# Patient Record
Sex: Female | Born: 1990 | Race: White | Hispanic: No | Marital: Single | Smoking: Former smoker
Health system: Southern US, Community
[De-identification: ages and names within clinical notes are randomized; demographics above are authoritative.]

## PROBLEM LIST (undated history)

## (undated) DIAGNOSIS — F29 Unspecified psychosis not due to a substance or known physiological condition: Secondary | ICD-10-CM

## (undated) DIAGNOSIS — F419 Anxiety disorder, unspecified: Secondary | ICD-10-CM

## (undated) DIAGNOSIS — R454 Irritability and anger: Secondary | ICD-10-CM

## (undated) DIAGNOSIS — F319 Bipolar disorder, unspecified: Secondary | ICD-10-CM

## (undated) DIAGNOSIS — F329 Major depressive disorder, single episode, unspecified: Secondary | ICD-10-CM

## (undated) DIAGNOSIS — F22 Delusional disorders: Secondary | ICD-10-CM

## (undated) HISTORY — DX: Unspecified psychosis not due to a substance or known physiological condition: F29

## (undated) HISTORY — DX: Delusional disorders: F22

## (undated) HISTORY — DX: Major depressive disorder, single episode, unspecified: F32.9

## (undated) HISTORY — DX: Irritability and anger: R45.4

## (undated) HISTORY — DX: Anxiety disorder, unspecified: F41.9

## (undated) HISTORY — PX: OTHER SURGICAL HISTORY: SHX169

---

## 2013-11-18 ENCOUNTER — Encounter (HOSPITAL_COMMUNITY): Payer: Self-pay | Admitting: Emergency Medicine

## 2013-11-18 ENCOUNTER — Emergency Department (HOSPITAL_COMMUNITY): Payer: 59

## 2013-11-18 ENCOUNTER — Emergency Department (HOSPITAL_COMMUNITY)
Admission: EM | Admit: 2013-11-18 | Discharge: 2013-11-19 | Disposition: A | Discharge status: Other Acute Inpatient Hospital | Payer: 59 | Attending: Emergency Medicine | Admitting: Emergency Medicine

## 2013-11-18 DIAGNOSIS — Z0289 Encounter for other administrative examinations: Secondary | ICD-10-CM | POA: Insufficient documentation

## 2013-11-18 DIAGNOSIS — R42 Dizziness and giddiness: Secondary | ICD-10-CM | POA: Insufficient documentation

## 2013-11-18 DIAGNOSIS — F1910 Other psychoactive substance abuse, uncomplicated: Secondary | ICD-10-CM

## 2013-11-18 DIAGNOSIS — F29 Unspecified psychosis not due to a substance or known physiological condition: Secondary | ICD-10-CM | POA: Diagnosis present

## 2013-11-18 DIAGNOSIS — F411 Generalized anxiety disorder: Secondary | ICD-10-CM | POA: Insufficient documentation

## 2013-11-18 DIAGNOSIS — Z79899 Other long term (current) drug therapy: Secondary | ICD-10-CM | POA: Insufficient documentation

## 2013-11-18 DIAGNOSIS — R55 Syncope and collapse: Secondary | ICD-10-CM | POA: Insufficient documentation

## 2013-11-18 DIAGNOSIS — R5381 Other malaise: Secondary | ICD-10-CM | POA: Insufficient documentation

## 2013-11-18 LAB — URINALYSIS, ROUTINE W REFLEX MICROSCOPIC
Bilirubin Urine: NEGATIVE
Glucose, UA: NEGATIVE mg/dL
Hgb urine dipstick: NEGATIVE
Ketones, ur: NEGATIVE mg/dL
Leukocytes, UA: NEGATIVE
Nitrite: NEGATIVE
Protein, ur: NEGATIVE mg/dL
Specific Gravity, Urine: 1.003 — ABNORMAL LOW (ref 1.005–1.030)
Urobilinogen, UA: 0.2 mg/dL (ref 0.0–1.0)
pH: 7.5 (ref 5.0–8.0)

## 2013-11-18 LAB — HCG, SERUM, QUALITATIVE: Preg, Serum: NEGATIVE

## 2013-11-18 LAB — COMPREHENSIVE METABOLIC PANEL
ALT: 11 U/L (ref 0–35)
AST: 15 U/L (ref 0–37)
Albumin: 4 g/dL (ref 3.5–5.2)
Alkaline Phosphatase: 60 U/L (ref 39–117)
BUN: 8 mg/dL (ref 6–23)
CO2: 26 mEq/L (ref 19–32)
Calcium: 9.6 mg/dL (ref 8.4–10.5)
Chloride: 101 mEq/L (ref 96–112)
Creatinine, Ser: 0.83 mg/dL (ref 0.50–1.10)
GFR calc Af Amer: >90 mL/min (ref >90)
GFR calc non Af Amer: >90 mL/min (ref >90)
Glucose, Bld: 104 mg/dL — ABNORMAL HIGH (ref 70–99)
Potassium: 4.1 mEq/L (ref 3.5–5.1)
Sodium: 137 mEq/L (ref 135–145)
Total Bilirubin: 0.3 mg/dL (ref 0.3–1.2)
Total Protein: 7 g/dL (ref 6.0–8.3)

## 2013-11-18 LAB — CBC WITH DIFFERENTIAL/PLATELET
Basophils Absolute: 0.1 10*3/uL (ref 0.0–0.1)
Basophils Relative: 1 % (ref 0–1)
Eosinophils Absolute: 0.1 10*3/uL (ref 0.0–0.7)
Eosinophils Relative: 1 % (ref 0–5)
HCT: 43.2 % (ref 36.0–46.0)
Hemoglobin: 14.8 g/dL (ref 12.0–15.0)
Lymphocytes Relative: 20 % (ref 12–46)
Lymphs Abs: 2.1 10*3/uL (ref 0.7–4.0)
MCH: 30.3 pg (ref 26.0–34.0)
MCHC: 34.3 g/dL (ref 30.0–36.0)
MCV: 88.5 fL (ref 78.0–100.0)
Monocytes Absolute: 0.8 10*3/uL (ref 0.1–1.0)
Monocytes Relative: 8 % (ref 3–12)
Neutro Abs: 7.5 10*3/uL (ref 1.7–7.7)
Neutrophils Relative %: 71 % (ref 43–77)
Platelets: 257 10*3/uL (ref 150–400)
RBC: 4.88 MIL/uL (ref 3.87–5.11)
RDW: 12.8 % (ref 11.5–15.5)
WBC: 10.7 10*3/uL — ABNORMAL HIGH (ref 4.0–10.5)

## 2013-11-18 LAB — RAPID URINE DRUG SCREEN, HOSP PERFORMED
Amphetamines: NOT DETECTED
Barbiturates: NOT DETECTED
Benzodiazepines: NOT DETECTED
Cocaine: NOT DETECTED
Opiates: NOT DETECTED
Tetrahydrocannabinol: NOT DETECTED

## 2013-11-18 LAB — TSH: TSH: 0.557 u[IU]/mL (ref 0.350–4.500)

## 2013-11-18 LAB — CARBAMAZEPINE LEVEL, TOTAL: Carbamazepine Lvl: 1.9 ug/mL — ABNORMAL LOW (ref 4.0–12.0)

## 2013-11-18 MED ORDER — LORAZEPAM 1 MG PO TABS
1.0000 mg | ORAL_TABLET | Freq: Three times a day (TID) | ORAL | Status: DC | PRN
  Administered 2013-11-18: 1 mg via ORAL
  Filled 2013-11-18 (×2): qty 1

## 2013-11-18 MED ORDER — OLANZAPINE 5 MG PO TBDP
5.0000 mg | ORAL_TABLET | Freq: Every day | ORAL | Status: DC
  Administered 2013-11-18: 5 mg via ORAL
  Filled 2013-11-18 (×2): qty 1
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
## 2013-11-18 NOTE — ED Notes (Signed)
Patient attempted to void but was not successful.
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
## 2013-11-18 NOTE — ED Notes (Signed)
Pt is aware of the need for urine. Parents will encourage pt to collect one as soon as possible. Urine cup at bedside

## 2013-11-18 NOTE — ED Notes (Signed)
Bed: WA08 Expected date:  Expected time:  Means of arrival:  Comments: syncope 

## 2013-11-18 NOTE — ED Notes (Signed)
Spoke with [facility] rep. Who stated pt would have bed available tomorrow morning after 8 am. Nurse should call report at [phone]. She will be staying in [facility] building. Told to call if pt's voluntary status changes.

## 2013-11-18 NOTE — ED Notes (Signed)
She is acting very strangely; and tells [place] she wants to "go shopping".  Her parents remain with her and ask if we can compell her to stay.  She does not threaten harm to herself or others.

## 2013-11-18 NOTE — ED Notes (Signed)
Patient denies SI, HI, AVH. Contracts for safety. Denies depression, rates anxiety 9/10. Patient reports being told of a heart murmur while in [place]  Patient states that she has anxiety and post traumatic stress that started while she was in the [place] Patient also reports loss and change among her friendships.  Encouragment offered. Patient safety maintained on the unit.Q 15 checks initiated and continued.

## 2013-11-18 NOTE — ED Notes (Signed)
Pt ambulating to the restroom frequently with parents, however no urine sample collected at this time.

## 2013-11-18 NOTE — ED Notes (Signed)
Mother and father took all of pts belonging home

## 2013-11-18 NOTE — ED Notes (Signed)
She states she "passed out" this morning while in her bathroom.  Her mom phoned EMS, who found her to be awake and oriented x4.  She further tells [place] she recently vacationed in the [place] to "visit my boyfriend".  She has notes with her which indicate she was given psychiatric care while there, and was placed on several meds.  Currently she is alert and oriented x 4 and in no distress.

## 2013-11-18 NOTE — ED Notes (Signed)
Family at bedside. 

## 2013-11-18 NOTE — ED Notes (Addendum)
Pt made aware of the new order for in and out cath. Pt then stated that she was able to urinate, pt and mother walked to restroom with urine cup. Pt then came out of restroom stating that she urinated but not in the cup. Pt and family members made aware that the cath will now be completed, pt stating that she refuses to have this done. Water at bedside. RN and MD made aware

## 2013-11-18 NOTE — ED Notes (Signed)
Explained to pt and family that pt would be moved to Psych ED while she awaited placement. Pt given scrubs to change into.

## 2013-11-18 NOTE — Progress Notes (Addendum)
The NP, [facility]) reports that the patient meets criteria for inpatient hospitalization.  Per the [facility]) there are no beds at [facility].    [facility] reports that they do have beds.  Writer faxed referral to [facility]. Incoming TTS will follow up on the referral.    Writer informeinformed the family, nurse working with the patient and the ER MD of the patients disposition. Writer  that the patient has been referre

## 2013-11-18 NOTE — Progress Notes (Signed)
Referrals have been faxed to [facility], [facility], [facility] and [facility].  Incoming staff will follow up on the referrals for placement.

## 2013-11-18 NOTE — Consult Note (Signed)
  Subjective:  Patient mother and father at bed side.  Patient states that she was in the [place] for the last five months visiting her boyfriend.  While there she states "I smoked some weed and a leaf but I don't know what the leaf was."  Patient states that she had no history of psychosis or psychiatric illness prior to the drug use.  Patient states that she feels like she has just went through another exorcism and states that she hears voices.  "I hear the voices laughing and mocking me.  Patient denies suicidal ideation and homicidal ideation.  Patient states that she is paranoid that evil is after her and trying to harm her.  Patient's parents states that she has complained the she sees her arm glowing and has gotten up during the night trying to get out.  Patient appeared to be responding to verbal and tactile stimuli.   Axis I: Psychotic Disorder NOS Axis II: Deferred Axis III: History reviewed. No pertinent past medical history. Axis IV: other psychosocial or environmental problems and problems related to social environment Axis V: 21-30 behavior considerably influenced by delusions or hallucinations OR serious impairment in judgment, communication OR inability to function in almost all areas Psychiatric Specialty Exam: Physical Exam  ROS  Blood pressure 107/75, pulse 66, temperature 98.1 F (36.7 C), temperature source Oral, resp. rate 16, last menstrual period 11/04/2013, SpO2 99.00%.There is no height or weight on file to calculate BMI.  General Appearance: Bizarre  Eye Contact::  Fair  Speech:  Clear and Coherent and Slow  Volume:  Normal  Mood:  Anxious  Affect:  Constricted, Depressed and Flat  Thought Process:  Disorganized and flight of ideas  Orientation:  Full (Time, Place, and Person)  Thought Content:  Hallucinations: Auditory Tactile Visual  Suicidal Thoughts:  No  Homicidal Thoughts:  No  Memory:  Immediate;   Fair Recent;   Fair  Judgement:  Impaired  Insight:   Lacking  Psychomotor Activity:  Decreased  Concentration:  Fair  Recall:  Fair  Akathisia:  No  Handed:  Right  AIMS (if indicated):     Assets:  Communication Skills Desire for Improvement Housing Social Support Transportation Vocational/Educational  Sleep:      Face to face consult/interview and discussed with Dr. Lucianne Muss  Disposition:  Inpatient treatment recommended.  Discontinue Haldol 2 mg; Start Zyprexa Zydis 5 mg Q HS; CT Scan without contrast.  Monitor patient for safety and stabilization until inpatient bed is found.  Shuvon B. Rankin FNP-BC Family Nurse Practitioner, Board Certified

## 2013-11-19 DIAGNOSIS — F1910 Other psychoactive substance abuse, uncomplicated: Secondary | ICD-10-CM

## 2013-11-19 DIAGNOSIS — F29 Unspecified psychosis not due to a substance or known physiological condition: Secondary | ICD-10-CM

## 2013-11-19 NOTE — Consult Note (Signed)
Note reviewed and agreed with  

## 2013-11-19 NOTE — ED Notes (Signed)
Pt resting in bed with eyes closed. No signs of distress.

## 2013-11-19 NOTE — Consult Note (Signed)
   Subjective:  Patient states that she is feeling better this morning and that the medication was working better.  Patient states that she slept all night.   Patient appearance looked better than yesterday.  Patient states "No I'm not hearing anything right now.  I slept all night.  I do feel better."  Axis I: Psychotic Disorder NOS Axis II: Deferred Axis III: History reviewed. No pertinent past medical history. Axis IV: other psychosocial or environmental problems and problems related to social environment Axis V: 21-30 behavior considerably influenced by delusions or hallucinations OR serious impairment in judgment, communication OR inability to function in almost all areas  Psychiatric Specialty Exam: Physical Exam  ROS  Blood pressure 115/81, pulse 116, temperature 97.4 F (36.3 C), temperature source Oral, resp. rate 18, last menstrual period 11/04/2013, SpO2 97.00%.There is no height or weight on file to calculate BMI.  General Appearance: Bizarre  Eye Contact::  Fair  Speech:  Clear and Coherent and Slow  Volume:  Normal  Mood:  Anxious  Affect:  Depressed  Thought Process:  Disorganized and flight of ideas  Orientation:  Full (Time, Place, and Person)  Thought Content:  Hallucinations: Auditory Tactile Visual  Suicidal Thoughts:  No  Homicidal Thoughts:  No  Memory:  Immediate;   Fair Recent;   Fair  Judgement:  Impaired  Insight:  Fair  Psychomotor Activity:  Decreased  Concentration:  Fair  Recall:  Fair  Akathisia:  No  Handed:  Right  AIMS (if indicated):     Assets:  Communication Skills Desire for Improvement Housing Social Support Transportation Vocational/Educational  Sleep:      Face to face consult/interview with Dr. Ladona Ridgel  Disposition: Will continue with current treatment plan for inpatient treatment.  Patient accepted at [facility] and will be transferred some time today.  Continue to monitor for safety and stabilization until patient is  transferred.    Dalonda Simoni B. Juanell Saffo FNP-BC Family Nurse Practitioner, Board Certified
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
## 2013-11-19 NOTE — Progress Notes (Signed)
Pt accepted to [facility] by Dr. Adonis Housekeeper. Patient to be transported voluntarily by [employer] transportation. RN calling report to [phone]. CSW informed EDP. CSW also spoke with Arlana Pouch at [facility] and confirmed they are ready for patient.   Catha Gosselin, LCSW [phone]  ED CSW .11/19/2013 858am

## 2013-11-19 NOTE — ED Notes (Signed)
psychiatrist at bedside 

## 2013-11-19 NOTE — ED Notes (Signed)
Patient up every 1.5 to 2 hours during the night. Patient refused Ativan. Read until going back to sleep around 0400. Resting quietly at present. [name], patient's father called. Can be reached at [phone] home or [phone] cell. He will call back during phone hours.   Patient safety maintained, Q 15 checks continue.

## 2013-11-19 NOTE — ED Notes (Signed)
Meal given

## 2013-11-19 NOTE — ED Notes (Signed)
Report called to [facility]. Transportation available to transport by [facility]  at 1030.

## 2013-11-19 NOTE — Consult Note (Signed)
Case discussed, agree with plan 

## 2013-12-22 ENCOUNTER — Other Ambulatory Visit (HOSPITAL_COMMUNITY): Payer: 59 | Attending: Psychiatry | Admitting: Psychiatry

## 2013-12-22 ENCOUNTER — Encounter (HOSPITAL_COMMUNITY): Payer: Self-pay

## 2013-12-22 DIAGNOSIS — F4310 Post-traumatic stress disorder, unspecified: Secondary | ICD-10-CM

## 2013-12-22 DIAGNOSIS — F323 Major depressive disorder, single episode, severe with psychotic features: Secondary | ICD-10-CM

## 2013-12-22 DIAGNOSIS — F29 Unspecified psychosis not due to a substance or known physiological condition: Secondary | ICD-10-CM

## 2013-12-22 DIAGNOSIS — F22 Delusional disorders: Secondary | ICD-10-CM | POA: Insufficient documentation
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
## 2013-12-23 ENCOUNTER — Other Ambulatory Visit (HOSPITAL_COMMUNITY): Payer: 59 | Admitting: Psychiatry

## 2013-12-23 ENCOUNTER — Inpatient Hospital Stay (HOSPITAL_COMMUNITY)
Admission: RE | Admit: 2013-12-23 | Discharge: 2013-12-31 | DRG: 885 | Disposition: A | Discharge status: Home / Self Care | Payer: 59 | Attending: Psychiatry | Admitting: Psychiatry

## 2013-12-23 ENCOUNTER — Encounter (HOSPITAL_COMMUNITY): Payer: Self-pay | Admitting: Behavioral Health

## 2013-12-23 DIAGNOSIS — F410 Panic disorder [episodic paroxysmal anxiety] without agoraphobia: Secondary | ICD-10-CM | POA: Diagnosis present

## 2013-12-23 DIAGNOSIS — F200 Paranoid schizophrenia: Principal | ICD-10-CM | POA: Diagnosis present

## 2013-12-23 DIAGNOSIS — F411 Generalized anxiety disorder: Secondary | ICD-10-CM | POA: Diagnosis present

## 2013-12-23 DIAGNOSIS — Z23 Encounter for immunization: Secondary | ICD-10-CM

## 2013-12-23 DIAGNOSIS — Z87891 Personal history of nicotine dependence: Secondary | ICD-10-CM

## 2013-12-23 DIAGNOSIS — F3160 Bipolar disorder, current episode mixed, unspecified: Secondary | ICD-10-CM

## 2013-12-23 DIAGNOSIS — F29 Unspecified psychosis not due to a substance or known physiological condition: Secondary | ICD-10-CM

## 2013-12-23 DIAGNOSIS — F22 Delusional disorders: Secondary | ICD-10-CM

## 2013-12-23 DIAGNOSIS — F1210 Cannabis abuse, uncomplicated: Secondary | ICD-10-CM | POA: Diagnosis present

## 2013-12-23 DIAGNOSIS — F319 Bipolar disorder, unspecified: Secondary | ICD-10-CM | POA: Insufficient documentation

## 2013-12-23 MED ORDER — ACETAMINOPHEN 325 MG PO TABS: 650.0000 mg | ORAL_TABLET | Freq: Four times a day (QID) | ORAL | Status: DC | PRN

## 2013-12-23 MED ORDER — ALUM & MAG HYDROXIDE-SIMETH 200-200-20 MG/5ML PO SUSP: 30.0000 mL | ORAL | Status: DC | PRN

## 2013-12-23 MED ORDER — TRAZODONE HCL 50 MG PO TABS
50.0000 mg | ORAL_TABLET | Freq: Every evening | ORAL | Status: DC | PRN
  Administered 2013-12-23: 50 mg via ORAL
  Filled 2013-12-23: qty 1

## 2013-12-23 MED ORDER — OLANZAPINE 5 MG PO TBDP
5.0000 mg | ORAL_TABLET | Freq: Three times a day (TID) | ORAL | Status: DC | PRN
  Administered 2013-12-23 – 2013-12-28 (×4): 5 mg via ORAL
  Filled 2013-12-23 (×5): qty 1

## 2013-12-23 MED ORDER — INFLUENZA VAC SPLIT QUAD 0.5 ML IM SUSP
0.5000 mL | INTRAMUSCULAR | Status: AC
Start: 2013-12-24 — End: 2013-12-24
  Administered 2013-12-24: 0.5 mL via INTRAMUSCULAR
  Filled 2013-12-23: qty 0.5

## 2013-12-23 MED ORDER — MAGNESIUM HYDROXIDE 400 MG/5ML PO SUSP: 30.0000 mL | Freq: Every day | ORAL | Status: DC | PRN

## 2013-12-23 NOTE — Progress Notes (Signed)
    Daily Group Progress Note  Program: IOP  Group Time: 9:00-10:30 am   Participation Level: None  Behavioral Response: none  Type of Therapy:  Case Manager assessment  Summary of Progress: Today was Pts first day in the program. Pt was completing the initial assessment and orientation with the case manager.      Group Time: 10:30 am - 12:00 pm   Participation Level:  Active  Behavioral Response: none  Type of Therapy: medical assessment  Summary of Progress: Pt was completing the initial medical assessment with the psychiatrist.   Carman ChingENGLEHORN,Nadiya Pieratt E, LCSW
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
## 2013-12-23 NOTE — B[facility] Counselor (Signed)
Patient sent from [facility] IOP by Dr. Rutherford Limerickadepalli for an inpatient admission.Their was an error with this patients episode/[facility] assessment for today 12/23/2013 and you will not be able to see the information for today unless you refer to 02/04/2014. Please refer to the 02/04/2014 for clinical information regarding this patient's admission today.

## 2013-12-23 NOTE — Progress Notes (Signed)
Patient ID: [name], female   DOB: 08-12-1991, 23 y.o.   MRN: [id number] D: pt. Visible on unit in dayroom and in the hall. Pt. Denies SHI, or AVH. Pt. Suspicious and does not forward much information. A: Writer introduced self to client and encouraged her to verbalize any concerns. Writer encouraged group.  Staff will monitor q3715min for safety. R: Pt. Is safe on the unit and attended group.

## 2013-12-23 NOTE — Progress Notes (Signed)
Patient ID: [name], female   DOB: February 17, 1991, 23 y.o.   MRN: [id number] Patient seen today, her mother reports that she was up from 11 PM to 1 AM writing in her journal. Patient was late for her group, when seen patient states that in the group she felt she was being targeted and that entire group was talking about her. She then shared her journal with me which shows significant paranoia, ideas of reference and thought insertion. Patient then broke down crying stating that she could not handle this anymore. With patient's permission mom was brought in and it was discussed that patient needed to be on the inpatient unit to be stabilized. The goal is to gradually taper the Geodon and discontinued it and increase the Risperdal. Her lithium and her Trileptal and Cogentin will be continued. Patient willing to be hospitalized and will go into the hospital voluntarily.

## 2013-12-23 NOTE — Progress Notes (Signed)
Patient ID: [name], female   DOB: 10-Apr-1991, 23 y.o.   MRN: [id number] D:  Pt was admitted to the inpatient unit [facility] due to worsening paranoid-delusional thinking.  A:  Dr. Rutherford Limerickadepalli spoke to [facility]erv[facility] about admission and [place] in assessment dept.  Transfer pt to inpt.  It is recommended that pt return to [facility]-IOP after inpt per Dr. Rutherford Limerickadepalli.

## 2013-12-23 NOTE — Progress Notes (Signed)
Admission note: According to pt, she have been feeling like no one believes here. Pt believes that people are out to kill her,  targeting her and want to see her in prison. Pt started feeling paranoid while she was in the [place] with her boyfriend. Per pt, she's compliant with taking her meds. Pt denies VH but reports AH.  Pt hear voices at bedtime, "the voices say numbers". Pt denies SI/HI. Pt verbalized that she is having racing thoughts. Pt presents with flight of ideas, rapid pressured speech and tangential thoughts. Pt denies using alcohol or illegal drugs at this time.

## 2013-12-23 NOTE — Progress Notes (Signed)
Pt mother and father present tonight for visitation. Pt voiced concerns about feeling like the people (pts) here are out to get her. She feels like the television is targeting here. Pt is frightened and don't know who or what to believe. Pt father said that pt had K-2 in her system when she was in [place]. Per pt father, Geodon and lithium makes pt shaking. Haldol and trileptal have not been effective for pt.    Pt consent for Ronnie ([place]) father to receive information.

## 2013-12-23 NOTE — Progress Notes (Signed)
    Daily Group Progress Note  Program: IOP  Group Time: 9:00-10:30 am   Participation Level: None  Behavioral Response: Appropriate  Type of Therapy:  Process Group  Summary of Progress: Today was Pts first day in the group. Pt was quiet, did not engage and observed the other member with the group process. Members introduced themselves to Pt. During break Pt informed the case manager she felt paranoid in the group and did not feel comfortable returning.      Group Time: 10:30 am - 12:00 pm   Participation Level:  None  Behavioral Response: none  Type of Therapy: none  Summary of Progress: Pt was referred inpatient for hospitalization for psychosis.   Carman ChingENGLEHORN,Scarleth Brame E, LCSW

## 2013-12-23 NOTE — BH Assessment (Addendum)
See note

## 2013-12-23 NOTE — Tx Team (Signed)
Initial Interdisciplinary Treatment Plan  PATIENT STRENGTHS: (choose at least two) Ability for insight Capable of independent living Motivation for treatment/growth  PATIENT STRESSORS: Traumatic event   PROBLEM LIST: Problem List/Patient Goals Date to be addressed Date deferred Reason deferred Estimated date of resolution  Delusions  12/23/13     Paranoia  12/23/13     AH 12/23/13     Racing thoughts                                     DISCHARGE CRITERIA:  Ability to meet basic life and health needs Adequate post-discharge living arrangements Improved stabilization in mood, thinking, and/or behavior Verbal commitment to aftercare and medication compliance  PRELIMINARY DISCHARGE PLAN: Attend aftercare/continuing care group Attend PHP/IOP  PATIENT/FAMIILY INVOLVEMENT: This treatment plan has been presented to and reviewed with the patient, Lisa [name], and/or family member.  The patient and family have been given the opportunity to ask questions and make suggestions.  [name] L 12/23/2013, 2:21 PM

## 2013-12-24 ENCOUNTER — Other Ambulatory Visit (HOSPITAL_COMMUNITY): Payer: 59

## 2013-12-24 DIAGNOSIS — F3160 Bipolar disorder, current episode mixed, unspecified: Secondary | ICD-10-CM

## 2013-12-24 DIAGNOSIS — F22 Delusional disorders: Secondary | ICD-10-CM | POA: Diagnosis present

## 2013-12-24 LAB — RAPID URINE DRUG SCREEN, HOSP PERFORMED
Amphetamines: NOT DETECTED
Barbiturates: NOT DETECTED
Benzodiazepines: NOT DETECTED
Cocaine: NOT DETECTED
Opiates: NOT DETECTED
Tetrahydrocannabinol: NOT DETECTED

## 2013-12-24 LAB — COMPREHENSIVE METABOLIC PANEL
ALT: 14 U/L (ref 0–35)
AST: 15 U/L (ref 0–37)
Albumin: 4 g/dL (ref 3.5–5.2)
Alkaline Phosphatase: 74 U/L (ref 39–117)
BUN: 11 mg/dL (ref 6–23)
CO2: 27 mEq/L (ref 19–32)
Calcium: 9 mg/dL (ref 8.4–10.5)
Chloride: 102 mEq/L (ref 96–112)
Creatinine, Ser: 0.99 mg/dL (ref 0.50–1.10)
GFR calc Af Amer: >90 mL/min (ref >90)
GFR calc non Af Amer: 81 mL/min — ABNORMAL LOW (ref >90)
Glucose, Bld: 78 mg/dL (ref 70–99)
Potassium: 4.2 mEq/L (ref 3.7–5.3)
Sodium: 140 mEq/L (ref 137–147)
Total Bilirubin: <0.2 mg/dL — ABNORMAL LOW (ref 0.3–1.2)
Total Protein: 7 g/dL (ref 6.0–8.3)

## 2013-12-24 LAB — CBC
HCT: 39.8 % (ref 36.0–46.0)
Hemoglobin: 13.6 g/dL (ref 12.0–15.0)
MCH: 30.4 pg (ref 26.0–34.0)
MCHC: 34.2 g/dL (ref 30.0–36.0)
MCV: 88.8 fL (ref 78.0–100.0)
Platelets: 318 10*3/uL (ref 150–400)
RBC: 4.48 MIL/uL (ref 3.87–5.11)
RDW: 12.6 % (ref 11.5–15.5)
WBC: 9.8 10*3/uL (ref 4.0–10.5)

## 2013-12-24 LAB — PREGNANCY, URINE: Preg Test, Ur: NEGATIVE

## 2013-12-24 MED ORDER — FLUOXETINE HCL 10 MG PO CAPS
10.0000 mg | ORAL_CAPSULE | Freq: Every day | ORAL | Status: DC
  Administered 2013-12-24 – 2013-12-25 (×2): 10 mg via ORAL
  Filled 2013-12-24 (×3): qty 1

## 2013-12-24 MED ORDER — TRIHEXYPHENIDYL HCL 2 MG PO TABS
2.0000 mg | ORAL_TABLET | Freq: Every day | ORAL | Status: DC
  Administered 2013-12-24 – 2013-12-31 (×8): 2 mg via ORAL
  Filled 2013-12-24 (×4): qty 1
  Filled 2013-12-24: qty 14
  Filled 2013-12-24 (×5): qty 1

## 2013-12-24 MED ORDER — PALIPERIDONE ER 6 MG PO TB24
6.0000 mg | ORAL_TABLET | Freq: Every day | ORAL | Status: DC
  Administered 2013-12-24 – 2013-12-27 (×4): 6 mg via ORAL
  Filled 2013-12-24 (×5): qty 1

## 2013-12-24 MED ORDER — TRAZODONE HCL 50 MG PO TABS
50.0000 mg | ORAL_TABLET | Freq: Every day | ORAL | Status: DC
  Administered 2013-12-24 – 2013-12-28 (×5): 50 mg via ORAL
  Filled 2013-12-24 (×3): qty 1
  Filled 2013-12-24: qty 14
  Filled 2013-12-24 (×5): qty 1

## 2013-12-24 NOTE — Tx Team (Signed)
  Interdisciplinary Treatment Plan Update   Date Reviewed: 12/24/2013 Time Reviewed:  8:22 AM  Progress in Treatment:   Attending groups: Yes Participating in groups: No Taking medication as prescribed: Yes  Tolerating medication: Yes Family/Significant other contact made: No  Patient understands diagnosis: Yes, AEB asking help with paranoia.   Discussing patient identified problems/goals with staff: Yes, See initial care plan Medical problems stabilized or resolved: Yes Denies suicidal/homicidal ideation: Yes, in txt team  Patient has not harmed self or others: Yes For review of initial/current patient goals, please see plan of care.  Estimated Length of Stay:  4-5 days  Reasons for Continued Hospitalization:  Paranoia  Hallucinations  Medication stabilization Depression   New Problems/Goals identified:    Discharge Plan or Barriers:   Pt plans to return, follow up outp.    Additional Comments:  According to pt, she have been feeling like no one believes here. Pt believes that people are out to kill her, targeting her and want to see her in prison. Pt started feeling paranoid while she was in the [place] with her boyfriend. Per pt, she's compliant with taking her meds. Pt denies VH but reports AH. Pt hear voices at bedtime, "the voices say numbers". Pt denies SI/HI. Pt verbalized that she is having racing thoughts. Pt presents with flight of ideas, rapid pressured speech and tangential thoughts. Pt denies using alcohol or illegal drugs at this time.    Attendees:  Signature: Thedore MinsMojeed Akintayo, MD  12/23/2013 9:23 AM   Signature: Richelle Itood Joanann Mies, LCSW  12/23/2013 9:23 AM   Signature: Fransisca KaufmannLaura Davis, NP  12/23/2013 9:23 AM   Signature: Joslyn Devonaroline Beaudry, RN  12/23/2013 9:23 AM   Signature: Liborio NixonPatrice White, RN  12/23/2013 9:23 AM   Signature:  12/23/2013 9:23 AM   Signature:  12/23/2013 9:23 AM   Signature:    Signature:    Signature:    Signature:    Signature:    Signature:      Simona Huhina  Yang MSW Intern   12/24/2013 8:30 AM
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
## 2013-12-24 NOTE — Progress Notes (Signed)
Patient ID: Lisa FilterCorina Windholz, female   DOB: 1991/05/16, 23 y.o.   MRN: [id number] D: pt. Visible on the unit, walking the halls for exercise accompanied by another client. Pt. Is also visible in day room. Pt. Reports ready to go home, "nothing against y'all but I'm ready to go:" "I was paranoid earlier, but I'm doing fine now" Pt. reports that mom visited today and they had a nice visit. A: Writer encouraged pt. To continue meds and speak with physician in the morning about potential discharge plans. Writer encouraged group. Staff will monitor q915min for safety. R: Pt. Is safe on the unit and attended groups.

## 2013-12-24 NOTE — Progress Notes (Signed)
Morning Wellness  The focus of this group is to educate the patient on the purpose and policies of crisis stabilization and provide a format to answer questions about their admission. The group details unit policies and expectations of patients while admitted.   Spoke with patient individually. Patient has [facility] concerns [facility]. Patient remains paranoid and anxious. Patient was given emotional support from staff. Patient encouraged to come to staff with concerns and needs.
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
## 2013-12-24 NOTE — [facility] Group Notes (Signed)
[facility] Group Notes:  (Counselor/Nursing/MHT/Case Management/Adjunct)  12/24/2013 1:15PM  Type of Therapy:  Group Therapy  Participation Level:  Active  Participation Quality:  Appropriate  Affect:  Flat  Cognitive:  Oriented  Insight:  Improving  Engagement in Group:  Limited  Engagement in Therapy:  Limited  Modes of Intervention:  Discussion, Exploration and Socialization  Summary of Progress/Problems: The topic for group was balance in life.  Pt participated in the discussion about when their life was in balance and out of balance and how this feels.  Pt discussed ways to get back in balance and short term goals they can work on to get where they want to be. Lisa [name] believes she is on the way to balance.  "I am communicating with the staff and taking my medications.  Those are the most important things."  For the most part, she was able to meaningfully contribute to the conversation.  However, at one point she told us that before she came in, her father gave her some xanax to try to alleviate her symptoms, but it was not prescribed for her, and is afraid if it is found in her labs, that she will be jailed.  She became tearful while talking about this. She was assured that she will not be jailed, and that her paranoid thoughts are unfounded.  She had a difficult letting go of this as she brought it up a second time.   Daryel Geraldorth, Lisa [name] 12/24/2013 1:30 PM

## 2013-12-24 NOTE — Progress Notes (Signed)
Adult Psychoeducational Group Note  Date:  12/24/2013 Time:  9:15 PM  Group Topic/Focus:  Wrap-Up Group:   The focus of this group is to help patients review their daily goal of treatment and discuss progress on daily workbooks.  Participation Level:  Active  Participation Quality:  Appropriate  Affect:  Appropriate  Cognitive:  Alert  Insight: Appropriate  Engagement in Group:  Engaged  Modes of Intervention:  Discussion  Additional Comments:  Pt stated that her day has been good since her medications have been changed. Her mom came to visit her for dinner and is one of her strongest supporters.  Kaleen OdeaCOOKE, Chase Arnall R 12/24/2013, 9:15 PM
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
## 2013-12-24 NOTE — Progress Notes (Signed)
D: Patient denies SI/HI and auditory and visual hallucinations. The patient has an anxious and preoccupied mood and affect. The patient is positive for delusions and paranoia. The patient believes that terrorists are "out to get her" and that people are "against her." Patient states that "I'm afraid of becoming schizophrenic like my mom." Patient is attending groups and interacting appropriately within the milieu.  A: Patient given emotional support from RN. Patient encouraged to come to staff with concerns and/or questions. Patient's medication routine continued. Patient's orders and plan of care reviewed.  R: Patient remains cooperative. Will continue to monitor patient q15 minutes for safety.

## 2013-12-25 ENCOUNTER — Other Ambulatory Visit (HOSPITAL_COMMUNITY): Payer: 59

## 2013-12-25 DIAGNOSIS — F411 Generalized anxiety disorder: Secondary | ICD-10-CM | POA: Diagnosis present

## 2013-12-25 LAB — URINALYSIS, ROUTINE W REFLEX MICROSCOPIC
Bilirubin Urine: NEGATIVE
Glucose, UA: NEGATIVE mg/dL
Hgb urine dipstick: NEGATIVE
Ketones, ur: NEGATIVE mg/dL
Nitrite: NEGATIVE
Protein, ur: NEGATIVE mg/dL
Specific Gravity, Urine: 1.006 (ref 1.005–1.030)
Urobilinogen, UA: 0.2 mg/dL (ref 0.0–1.0)
pH: 6 (ref 5.0–8.0)

## 2013-12-25 LAB — URINE MICROSCOPIC-ADD ON

## 2013-12-25 LAB — TSH: TSH: 1.726 u[IU]/mL (ref 0.350–4.500)

## 2013-12-25 MED ORDER — LORAZEPAM 0.5 MG PO TABS
0.5000 mg | ORAL_TABLET | Freq: Three times a day (TID) | ORAL | Status: DC
  Administered 2013-12-25 – 2013-12-26 (×5): 0.5 mg via ORAL
  Filled 2013-12-25 (×5): qty 1

## 2013-12-25 MED ORDER — FLUOXETINE HCL 20 MG PO CAPS
20.0000 mg | ORAL_CAPSULE | Freq: Every day | ORAL | Status: DC
  Administered 2013-12-26 – 2013-12-31 (×6): 20 mg via ORAL
  Filled 2013-12-25 (×3): qty 1
  Filled 2013-12-25: qty 14
  Filled 2013-12-25 (×4): qty 1

## 2013-12-25 NOTE — [facility] Group Notes (Signed)
[facility] LCSW Group Therapy  12/25/2013  1:05 PM  Type of Therapy:  Group therapy  Participation Level:  Active  Participation Quality:  Attentive  Affect:  Flat  Cognitive:  Oriented  Insight:  Limited  Engagement in Therapy:  Limited  Modes of Intervention:  Discussion, Socialization  Summary of Progress/Problems:  Chaplain was here to lead a group on themes of hope and courage. Hope is looking forward to the future.  "Picture a candle in a dark room."  Talked about her family and how they are supportive and give her hope.  Also identified music, both playing the piano and cello and listening to music, that are "good for my soul" and give her hope. Daryel Geraldorth, Calene Paradiso B 12/25/2013 1:26 PM
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
## 2013-12-25 NOTE — Progress Notes (Signed)
Patient ID: [name], female   DOB: 04/19/1991, 23 y.o.   MRN: [id number] Client remains reserved and quiet minimal interaction with others. Her gait is slow and rigid and expression is wide-eyed. She rates her anxiety 6/10 and paranoid feelings a 7/10. She feels that people are out to harm her and she only trusts the doctor and some of the staff. She prefers to interact with female staff. She appears to be internally preoccupied. Will continue to monitor safety on 15 min observation checks.

## 2013-12-25 NOTE — Progress Notes (Signed)
Patient ID: [name], female   DOB: 09/03/1991, 23 y.o.   MRN: [id number] Client looks calmer and states that she feels much better after the ativan, "I'm not having racing thoughts and I don't feel like people are after me." She said it was as if the ativan just "swooshed" it all away. Her affect appeared more relaxed and she was able to have a conversation better than she did earlier in the shift. Denies SI. Will continue to monitor safety q 15 mins

## 2013-12-25 NOTE — [facility] Group Notes (Signed)
[facility]   12/25/2013 10:34 AM  Participation Quality:  Engaged  Mood/Affect:  Anxious and Flat  Depression Rating:  4  Anxiety Rating:  6  Thoughts of Suicide:  No Will you contract for safety?   NA  Current AVH:  Denies, but is extremely paranoid  Plan for Discharge/Comments:  States she is feeling anxious.  Presents with flat affect.  States she does not want to return to IOP because she does not like the group setting.  Would prefer individual therapy.  States meds are "OK."  [employer] Means: family  Supports: family   [place], Baldo DaubRodney B

## 2013-12-25 NOTE — Progress Notes (Signed)
NUTRITION ASSESSMENT  Pt identified as at risk on the Malnutrition Screen Tool  INTERVENTION: 1. Educated patient on the importance of nutrition and encouraged intake of food and beverages. 2. Discussed weight goals. 3. Supplements: none at this time  NUTRITION DIAGNOSIS: Predicted sub optimal intake related to mental health status AEB patient's report.  Goal: Pt to meet >/= 90% of their estimated nutrition needs.  Monitor:  PO intake  Assessment:  Patient admitted with bipolar disorder with delusional presentation.  [place] returned [place] 1 [place] ago and [facility] her return.  Patient reports that she lost 10 lbs in [place] but has since regained this and is back at her [facility].  Reports that she has had a poor appetite for the past week and was not eating as well because of the "burden" it presented with current mental health issues.  Prior to this, patient states that she was eating well.  Fair to good intake currently per patient.    [age] y.o. female  Height: Ht Readings from Last 1 Encounters:  12/23/13 5\' 6"  (1.676 m)    Weight: Wt Readings from Last 1 Encounters:  12/23/13 128 lb (58.06 kg)    Weight Hx: Wt Readings from Last 10 Encounters:  12/23/13 128 lb (58.06 kg)    BMI:  Body mass index is 20.67 kg/(m^2). Pt meets criteria for normal weight based on current BMI.  Estimated Nutritional Needs: Kcal: 25-30 kcal/kg Protein: > 1 gram protein/kg Fluid: 1 ml/kcal  Diet Order: General Pt is also offered choice of unit snacks mid-morning and mid-afternoon.  Pt is eating as desired.   Lab results and medications reviewed.   Oran ReinLaura Deja Pisarski, RD, LDN Clinical Inpatient Dietitian Pager:  [phone] Weekend and after hours pager:  [phone]

## 2013-12-26 DIAGNOSIS — F29 Unspecified psychosis not due to a substance or known physiological condition: Secondary | ICD-10-CM

## 2013-12-26 MED ORDER — LORAZEPAM 0.5 MG PO TABS
0.5000 mg | ORAL_TABLET | Freq: Three times a day (TID) | ORAL | Status: DC | PRN
  Administered 2013-12-27 (×2): 0.5 mg via ORAL
  Filled 2013-12-26 (×2): qty 1
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
## 2013-12-26 NOTE — Progress Notes (Signed)
[facility] Group Notes:  (Nursing/MHT/Case Management/Adjunct)  Date:  12/26/2013  Time:  8:00 p.m.   Type of Therapy:  Psychoeducational Skills  Participation Level:  Active  Participation Quality:  Attentive  Affect:  Appropriate  Cognitive:  Appropriate  Insight:  Appropriate  Engagement in Group:  Engaged  Modes of Intervention:  Education  Summary of Progress/Problems: The patient shared in group this evening that she had a good day overall. The patient indicated that she had a good family visit today and that she felt "regular" (more normal). In terms of the theme for the day, she mentioned that her coping skill is to write, practice yoga, and go running.   Hazle CocaGOODMAN, Lisa [name] 12/26/2013, 10:52 PM

## 2013-12-26 NOTE — Progress Notes (Signed)
Patient ID: [name], female   DOB: 02/01/1991, 23 y.o.   MRN: [id number] Psychoeducational Group Note  Date:  12/26/2013 Time:1000am  Group Topic/Focus:  Identifying Needs:   The focus of this group is to help patients identify their personal needs that have been historically problematic and identify healthy behaviors to address their needs.  Participation Level:  Active  Participation Quality:  Appropriate  Affect:  Labile  Cognitive:  Disorganized and Lacking  Insight:  Resistant  Engagement in Group:  Resistant  Additional Comments:  Inventory and Psychoeducational group- healthy coping skills   Valente DavidWeaver, Tiquan Bouch Brooks 12/26/2013,9:50 AM

## 2013-12-26 NOTE — [facility] Group Notes (Signed)
[facility] LCSW Group Therapy Note  12/26/2013 11:10  - 11:40 AM  Type of Therapy and Topic:  Group Therapy: Avoiding Self-Sabotaging and Enabling Behaviors  Participation Level:  Minimal   Mood: Reticent, flat  Description of Group:     Learn how to identify obstacles, self-sabotaging and enabling behaviors, what are they, why do we do them and what needs do these behaviors meet? Discuss unhealthy relationships and how to have positive healthy boundaries with those that sabotage and enable. Explore aspects of self-sabotage and enabling in yourself and how to limit these self-destructive behaviors in everyday life.  Therapeutic Goals: 1. Patient will identify one obstacle that relates to self-sabotage and enabling behaviors 2. Patient will identify one personal self-sabotaging or enabling behavior they did prior to admission 3. Patient able to establish a plan to change the above identified behavior they did prior to admission:  4. Patient will demonstrate ability to communicate their needs through discussion and/or role plays.   Summary of Patient Progress: The main focus of today's process group was to explain what "self-sabotage" means and use Motivational Interviewing to discuss what benefits, negative or positive, were involved in a self-identified self-sabotaging behavior. We then talked about reasons the patient may want to change the behavior and her current desire to change. Lisa [name] was present for group for only a short time and shared that she sought help after realizing she had a psychotic break and hopes to have major improvement by the fall which is he favorite time of year.   Therapeutic Modalities:   Cognitive Behavioral Therapy Person-Centered Therapy Motivational Interviewing   [name], LCSW

## 2013-12-26 NOTE — Progress Notes (Signed)
Patient ID: Lisa FilterCorina Pelzel, female   DOB: 12-12-1990, 23 y.o.   MRN: [id number] Patient asleep; no s/s of distress noted at this time. Respirations regular and unlabored.

## 2013-12-26 NOTE — Progress Notes (Signed)
[facility] Group Notes:  (Nursing/MHT/Case Management/Adjunct)  Date:  12/26/2013  Time:  8:00 p.m.   Type of Therapy:  Psychoeducational Skills  Participation Level:  Active  Participation Quality:  Appropriate  Affect:  Appropriate  Cognitive:  Appropriate  Insight:  Good  Engagement in Group:  Engaged  Modes of Intervention:  Education  Summary of Progress/Problems: The patient described her day as having been "pretty good". She attributed her day to having a good visit with her mother and to going to the gym to exercise (played volleyball). In addition, she was pleased about having taken a new medication today. In terms of the theme of the day, she intends to talk to her friends, text message, and e-mail people as a recovery prevention technique.   Sani Madariaga S 12/26/2013, 1:04 AM

## 2013-12-26 NOTE — Progress Notes (Signed)
Patient ID: Arman FilterCorina Goynes, female   DOB: Oct 13, 1991, 23 y.o.   MRN: [id number] 12-26-13 nursing shift note: : D: this pt continues to be paranoid, with an intense stare during conversation. She seems to be preoccupied and made a statement regarding "angels" and that they are offensive to her. She is still very delusional.  A: RN reoriented this pt and also ask the MD to consider giving her additional medications for her anxiety. R: MD stated he would consider giving her additional anxiety medications. On her inventory sheet she wrote: slept fair, appetite good, energy normal,  attention good with her depression at 3 and hopelessness at 2. No type of w/d symptoms. She denies any si/hi/av. No physical problems in the last 24 hrs and no pain. After discharge she plans to " start planning for her future". Also she stated she "  still feels like people are stalking me and my family, but my anxiety about it isn't too bad anymore". RN will monitor and Q 15 min ck's continue.

## 2013-12-27 MED ORDER — PALIPERIDONE ER 3 MG PO TB24
3.0000 mg | ORAL_TABLET | Freq: Once | ORAL | Status: AC
  Administered 2013-12-27: 3 mg via ORAL
  Filled 2013-12-27: qty 1

## 2013-12-27 MED ORDER — PALIPERIDONE ER 3 MG PO TB24
ORAL_TABLET | ORAL | Status: AC
  Administered 2013-12-27: 12:00:00
  Filled 2013-12-27: qty 2

## 2013-12-27 MED ORDER — PALIPERIDONE ER 6 MG PO TB24
9.0000 mg | ORAL_TABLET | Freq: Every day | ORAL | Status: DC
  Administered 2013-12-28 – 2013-12-31 (×4): 9 mg via ORAL
  Filled 2013-12-27 (×6): qty 1

## 2013-12-27 NOTE — Progress Notes (Signed)
[facility]  12/27/2013 11:21 AM [name]  MRN:  [id number] Subjective:  HPI Comments: [name] is  a 23 y/o female with a past psychiatric history significant for Bipolar I Disorder. The patient is referred for psychiatric services for medication management.   [place] [employer]. Location-The patient reports that she had some delusional thought and feeling that another patient was sending her subliminal messages.  . Quality:  The patient reports that her main stressors are:  Today she does report that she believes that she would be on the [facility] and that another patient.  In the area of affective symptoms, patient appears anxious. Patient denies current suicidal ideation, intent, or plan. Patient denies current homicidal ideation, intent, or plan. Patient denies auditory hallucinations. Patient denies visual hallucinations. Patient endorses worsening symptoms of paranoia. Patient states sleep is slightly. Appetite is fair. Energy level is poor. Patient denies symptoms of anhedonia. Patient denies hopelessness, helplessness, or guilt.   . Severity: Severe  . Duration-2 months  . Timing: Throughout the day . Context-Paranoid ideation that her facebook account and cellphone was hacked to carry out terrorist bombings. Concerns that she will go to a government facility. . Modifying factors (e.g., feels better with people around)  . Associated signs and symptoms (e.g., loss of appetite, loss of weight, loss of sexual interest)  She endorses  recent symptoms consistent with psychosis, particularly auditory or visual hallucinations, thought broadcasting/insertion/withdrawal, or ideas of reference.  Denies any history of trauma or symptoms consistent with PTSD such as flashbacks, nightmares, hypervigilance, feelings of numbness or inability to connect with others.    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 Intent:  Patient denies. Means:  Patient denies. Homicidal Ideation:  Plan:  Patient denies. Intent:  Patient denies. Means:  Patient denies. AEB (as evidenced by):  Psychiatric Specialty Exam: Review of Systems  Constitutional: Negative for fever, chills and weight loss.  Respiratory: Negative for cough, hemoptysis and sputum production.   Cardiovascular: Negative for chest pain and palpitations.  Gastrointestinal: Negative for heartburn, nausea, vomiting and abdominal pain.  Genitourinary: Negative for dysuria and urgency.    Blood pressure 128/89, pulse 128, temperature 98.2 F (36.8 C), temperature source Oral, resp. rate 18, height 5\' 6"  (1.676 m), weight 58.06 kg (128 lb), last menstrual period 11/19/2013.Body mass index is 20.67 kg/(m^2).  General Appearance: Casual and Fairly Groomed  Eye Contact::  Good  Speech:  Pressured  Volume:  Increased  Mood:  "Not bad"  Affect:  Appropriate and Congruent  Thought Process:  Disorganized  Orientation:  Other:  Person, place and situation.  Thought Content:  Delusions and Paranoid Ideation  Suicidal Thoughts:  No  Homicidal Thoughts:  No  Memory:  Immediate;   Good Recent;   Good Remote;   Good  Judgement:  Impaired  Insight:  Fair  Psychomotor Activity:  Normal  Concentration:  Good  Recall:  Good  Akathisia:  No  Handed:  Right  AIMS (if indicated):   As noted in chart  Assets:  Communication Skills Desire for Improvement  Sleep:  Number of Hours: 6.25   Current Medications: Current Facility-Administered Medications  Medication Dose Route Frequency Provider Last Rate Last Dose  . acetaminophen (TYLENOL) tablet 650 mg  650 mg Oral  Q6H PRN Fransisca Kaufmann, NP      . alum & mag hydroxide-simeth (MAALOX/MYLANTA) 200-200-20 MG/5ML  suspension 30 mL  30 mL Oral Q4H PRN Fransisca Kaufmann, NP      . FLUoxetine (PROZAC) capsule 20 mg  20 mg Oral Daily Mojeed Akintayo   20 mg at 12/27/13 0719  . LORazepam (ATIVAN) tablet 0.5 mg  0.5 mg Oral TID PRN Larena Sox, MD   0.5 mg at 12/27/13 0720  . magnesium hydroxide (MILK OF MAGNESIA) suspension 30 mL  30 mL Oral Daily PRN Fransisca Kaufmann, NP      . OLANZapine zydis (ZYPREXA) disintegrating tablet 5 mg  5 mg Oral Q8H PRN Fransisca Kaufmann, NP   5 mg at 12/27/13 0720  . paliperidone (INVEGA) 24 hr tablet 6 mg  6 mg Oral Daily Mojeed Akintayo   6 mg at 12/27/13 0719  . traZODone (DESYREL) tablet 50 mg  50 mg Oral QHS Mojeed Akintayo   50 mg at 12/26/13 2153  . trihexyphenidyl (ARTANE) tablet 2 mg  2 mg Oral Daily Mojeed Akintayo   2 mg at 12/27/13 1610    Lab Results:  No results found for this or any previous visit (from the past 48 hour(s)).  Physical Findings: AIMS: Facial and Oral Movements Muscles of Facial Expression: None, normal Lips and Perioral Area: None, normal Jaw: None, normal Tongue: None, normal,Extremity Movements Upper (arms, wrists, hands, fingers): None, normal Lower (legs, knees, ankles, toes): None, normal, Trunk Movements Neck, shoulders, hips: None, normal, Overall Severity Severity of abnormal movements (highest score from questions above): None, normal Incapacitation due to abnormal movements: None, normal Patient's awareness of abnormal movements (rate only patient's report): No Awareness, Dental Status Current problems with teeth and/or dentures?: No Does patient usually wear dentures?: No  CIWA:    COWS:     Treatment Plan Summary: Daily contact with patient to assess and evaluate symptoms and progress in treatment Medication management  Plan: PLAN OF CARE: Treatment Plan/Recommendations: Plan: Review of chart, vital signs, medications, and notes.  1-Admit for crisis management and stabilization. Estimated length of stay 5-7 days past his current  stay of  2-Individual and group therapy encouraged  3-Medication management: Increase invega to 9 mg today. Will consider increase in Invega based on her PRN use of olanzapine.  Continue prozac 20 mg, will consider further increase.  Ativan 1 mg TID-will changed to PRN. 4-Coping skills for substance abuse, and anxiety developing--  5-Continue crisis stabilization and management  6-Address health issues--monitoring vital signs, stable  7-Treatment plan in progress to prevent relapse of alcohol abuse and anxiety  8-Psychosocial education regarding relapse prevention and self-care  8-Health care follow up as needed for any health concerns  9-Call for consult with hospitalist for additional specialty patient services as needed.  Medical Decision Making Problem Points:  Established problem, stable/improving (1) and Review of psycho-social stressors (1) Data Points:  Order Aims Assessment (2) Review and summation of old records (2) Review of medication regiment & side effects (2)  I certify that inpatient services furnished can reasonably be expected to improve the patient's condition.   Lanetra Hartley 12/27/2013, 11:21 AM

## 2013-12-27 NOTE — [facility] Group Notes (Signed)
[facility] LCSW Group Therapy Note   12/27/2013  11:15 To 11:50 AM   Type of Therapy and Topic: Group Therapy: Feelings Around Returning Home & Establishing a Supportive Framework and Activity to Identify signs of Improvement or Decompensation   Participation Level: Did not attend  Catherine C Harrill, LCSW  

## 2013-12-27 NOTE — Progress Notes (Signed)
Patient ID: Arman FilterCorina Bert, female   DOB: 1991-03-29, 23 y.o.   MRN: [id number] Psychoeducational Group Note  Date:  12/27/2013 Time:  0930am  Group Topic/Focus:  Making Healthy Choices:   The focus of this group is to help patients identify negative/unhealthy choices they were using prior to admission and identify positive/healthier coping strategies to replace them upon discharge.  Participation Level:  Active  Participation Quality:  Redirectable  Affect:  Tearful  Cognitive:  Confused  Insight:  Off Topic  Engagement in Group:  Off Topic  Additional Comments:  Inventory group with rn   Valente DavidWeaver, Jamonica Schoff Brooks 12/27/2013,9:54 AM

## 2013-12-27 NOTE — [facility] Group Notes (Signed)
[facility] Group Notes:  (Nursing/MHT/Case Management/Adjunct)  Date:  12/27/2013  Time:  4:40 PM  Type of Therapy:  Psychoeducational Skills  Participation Level:  Did Not Attend  Participation Quality:  na  Affect:  na  Cognitive:  na  Insight:  None  Engagement in Group:  na  Modes of Intervention:  na  Summary of Progress/Problems:  Malva LimesStrader, Valeria Boza 12/27/2013, 4:40 PM

## 2013-12-27 NOTE — Progress Notes (Signed)
[facility] Group Notes:  (Nursing/MHT/Case Management/Adjunct)  Date:  12/27/2013  Time:  8:00 p.m.   Type of Therapy:  Psychoeducational Skills  Participation Level:  Active  Participation Quality:  Attentive  Affect:  Appropriate  Cognitive:  Appropriate  Insight:  Improving  Engagement in Group:  Engaged  Modes of Intervention:  Education  Summary of Progress/Problems: The patient described her day as having been "not too bad". The patient shared in group this evening that her mother came in today for a visit. She also stated that she had "clearer thoughts". In terms of the theme for the day, her support system will consist of her parents and friends.   Litsy Epting S 12/27/2013, 10:50 PM

## 2013-12-27 NOTE — Progress Notes (Signed)
Pt woke up out of sleep having a flight of ideas and paronia about Delanna Ahmadillen Degenerous and a pt on the hall getting together to conspire against her. Pt stated she at one time thought Alvino Chapelllen was trying to communicate with her through the tv, and she had a nurse named Alvino Chapelllen while at [employer], pt made a connection between the nurse and the tv star, pt wrote a letter to the nurse thinking that she was Graceann CongressEllen Dengenerous, and now pt thinks the real Graceann Congressllen Dengenerous has gotten her letter and is watching her via the pt on the hall that is from [place]. Writer spoke with pt and informed her that no one inside these walls is out to get her, and that every pt here is here to better themselves and is not focusing on anyone else. Writer told pt that she needs to focus on bettering herself and not worry about what could be happening outside the unit, because while on the unit, all she can do is work on what's happening now.

## 2013-12-27 NOTE — Progress Notes (Signed)
Report received from [facility]. Writer entered patients room and observed her lying in bed asleep with eyes closed and respirations even and unlabored. Safety maintained on unit with 15 in checks.

## 2013-12-27 NOTE — Progress Notes (Signed)
D: pt sitting in dayroom watching tv. Pt stated today was a good day. Pt attended groups. Denies si/hi/avh. Denies pain. Upon d/c pt plans to live with parents and attend [employer] for pt school. Pt rates her depression 1/10. Pt is calm and cooperative A: q 15 min safety checks. 1:1 time offered R: pt remains safe on unit. No further complaints at this time

## 2013-12-27 NOTE — Progress Notes (Signed)
D.  Pt. Denies SI/HI and denies A/V hallucinations.  Flat affect and guarded.  Limited interaction with Clinical research associatewriter but pt. Did attend group.  No issues voiced.  Compliant with medication. A.  Q 15 min checks maintained for safety. R.  Pt. Remains safe.

## 2013-12-27 NOTE — Progress Notes (Signed)
Patient ID: [name], female   DOB: 10/23/1991, 23 y.o.   MRN: [id number] 12-27-13 nursing shift note: D: pt seemed to be having a very hard morning. She complained of feeling confused and has made several delusional statments. A RN reviewed and administered a one time order for invega 3 mg at 1148. Also at 0720 the pt was administered ativan 0.5 mg prn and Zyprexa 5 mg prn, to help her with anxiety and psychosis. She received another dose of ativan 0.5 mg prn at 1139 for her anxiety. R: her anxiety was decreased,  but he psychosis and confusion remain. On her inventory sheet she wrote: slept fair, appetite improving, energy low, attention good with her depression at 4 and hopelessness at 6. She denied any SI and denied any physical problems. After discharge she plan to "speak up for myself, take healing 1 day at a time. She also stated on her inventory sheet,  " I feel confused by subliminal messages". RN will monitor and Q 15 min ck's continue.

## 2013-12-28 ENCOUNTER — Other Ambulatory Visit (HOSPITAL_COMMUNITY): Payer: 59

## 2013-12-28 DIAGNOSIS — F200 Paranoid schizophrenia: Secondary | ICD-10-CM | POA: Insufficient documentation

## 2013-12-28 MED ORDER — LORAZEPAM 0.5 MG PO TABS
0.5000 mg | ORAL_TABLET | Freq: Two times a day (BID) | ORAL | Status: DC
  Administered 2013-12-28 – 2013-12-31 (×5): 0.5 mg via ORAL
  Filled 2013-12-28 (×6): qty 1

## 2013-12-28 NOTE — [facility] Group Notes (Signed)
[facility] Note   12/28/2013 11:01 AM  Participation Quality:  Did not attend    [place], Jkayla Spiewak B

## 2013-12-28 NOTE — Progress Notes (Signed)
D:  Patient continues to believe that Armeniahina is controlling us and she is receiving subliminal messages through her devotional guide and her Bible.  States that something bad is going to happen in March, April, or May based on her recent copy of "Our Daily Bread."  States that [place] going to be targeted.  She becomes tearful when she talks about these things as she knows people do not believe her.  On self inventory she rated depression at 2 and hopelessness at 5.  She denies suicidal ideation.  A:  Attempted to explain to patient that none of things she was talking about were possible and that we were all safe here at this time.  She has taken all of her scheduled medications today.  She has attended some, not all groups.  Encouraged her to attend groups to process her thoughts and feelings.  R:  Cooperative with staff.  Tearful at times.  Interacting some with peers.  Tolerating medications well.  Safety is maintained.

## 2013-12-28 NOTE — [facility] Group Notes (Signed)
[facility] LCSW Group Therapy  12/28/2013 1:15 pm  Type of Therapy: Process Group Therapy  Participation Level:  Minimal  Participation Quality:  Appropriate  Affect:  Flat  Cognitive:  Oriented  Insight: None currently  Engagement in Group:  Limited  Engagement in Therapy:  Limited  Modes of Intervention:  Activity, Clarification, Education, Problem-solving and Support  Summary of Progress/Problems: Today's group addressed the issue of overcoming obstacles.  Patients were asked to identify their biggest obstacle post d/c that stands in the way of their on-going success, and then problem solve as to how to manage this. Just prior to group starting, Cheri GuppyCorina showed me her recent writings in her journal.  They were extensive, and ended with "I am not a terrorist" written in large capital letters.  She shared that she feels like no one believes her, and this is quite unsettling to her.  During group, she sat quietly throughout, and at the end simply stated that she has trust issues, and is having difficulty determining whom she can trust.   Daryel GeraldNorth, Gera Inboden B 12/28/2013   3:40 PM

## 2013-12-28 NOTE — Progress Notes (Addendum)
D: Patient in her room awake in bed on first approach.  Patient state she had a good day.  Patient states the highlight of her day was going to the gym and playing volleyball.  Patient pleasant and cooperative.  Patient forwards little with Clinical research associatewriter but appropriate.  Patient states she has been more tired today.  Patient denies SI/HI and denies AVH. A: Staff to monitor Q 15 mins for safety.  Encouragement and support offered.  Scheduled medications administered per orders. Zyprexa administered prn for anxiety/ agitation. R: Patient remains safe on the unit.  Patient attended group tonight.  Patient taking administered medications.

## 2013-12-28 NOTE — Progress Notes (Signed)
The focus of this group is to help patients review their daily goal of treatment and discuss progress on daily workbooks. Pt attended the evening group session and responded to all discussion prompts from the Writer. Pt reported having had a good day on the unit, the highlight of which playing volleyball in the gym. Pt reported feeling sad over missing a friend's 22st birthday today, but said friend did come by to visit Pt, which made her happy. Pt's affect was appropriate.
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
## 2013-12-28 NOTE — Progress Notes (Signed)
Adult Psychoeducational Group Note  Date:  12/28/2013 Time:  11:49 AM  Group Topic/Focus:  Wellness Toolbox:   The focus of this group is to discuss various aspects of wellness, balancing those aspects and exploring ways to increase the ability to experience wellness.  Patients will create a wellness toolbox for use upon discharge.  Participation Level:  Active  Participation Quality:  Attentive  Affect:  Appropriate  Cognitive:  Appropriate  Insight: Good  Engagement in Group:  Engaged  Modes of Intervention:  Discussion  Additional Comments:  Pt participated in group. Pt stated that " she doesnt know who she can trust, she cant trust herself."  Tonita CongMcLaurin, Kosisochukwu Burningham L 12/28/2013, 11:49 AM

## 2013-12-29 ENCOUNTER — Other Ambulatory Visit (HOSPITAL_COMMUNITY): Payer: 59

## 2013-12-29 NOTE — Progress Notes (Signed)
Adult Psychoeducational Group Note  Date:  12/29/2013 Time:  0900 am   Group Topic/Focus:  Recovery Goals:   The focus of this group is to identify appropriate goals for recovery and establish a plan to achieve them.  Participation Level:  Did Not Attend  Cranford MonBeaudry, Aryianna Earwood Evans 12/29/2013, 5:42 PM

## 2013-12-29 NOTE — [facility] Group Notes (Signed)
Adult Psychoeducational Group Note  Date:  12/29/2013 Time:  9:25 PM  Group Topic/Focus:  Wrap-Up Group:   The focus of this group is to help patients review their daily goal of treatment and discuss progress on daily workbooks.  Participation Level:  Minimal  Participation Quality:  Attentive  Affect:  Flat  Cognitive:  Alert  Insight: Limited  Engagement in Group:  Limited  Modes of Intervention:  Discussion  Additional Comments:  Cheri GuppyCorina stated her day was all right.  She also stated getting her personally hygiene products were the highlight of her day.  She expressed her two positive qualities as being pretty honest and respectful.  Caroll RancherLindsay, Tamme [place] A 12/29/2013, 9:25 PM
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
## 2013-12-29 NOTE — Progress Notes (Signed)
D: Patient in the dayroom on approach. Patient states she had a good day but feel drowsy.  Patient states she has been increasing her fluid intake today.  Patient minimal has minimal conversation with Clinical research associatewriter.  Patient denies SI/HI and denies AVH. A: Staff to monitor Q 15 mins for safety.  Encouragement and support offered. No scheduled medications administered per orders.  Patient refused sleep medication. R: Patient remains safe on the unit.  Patient attended group tonight.  Patient visible on the unit and interacting with peers.  Patient did not take sleep medication tonight.

## 2013-12-29 NOTE — Progress Notes (Signed)
D: Pt denies SI/HI/AVH. Pt reports feeling tired this morning and believe it"s due to her meds. Pt reports that she slept well last night. Pt stated she continues to feel paranoid and believe that people are out to get her. Per pt, "I don't know who to believe" Pt isolates in her room and has minimal interaction on the milieu. Pt presents with flat affect and depressed mood. A: Medications administered as ordered per MD. Verbal support given. Pt encouraged to attend groups. 15 minute checks performed for safety.R:Pt safety  Maintained.
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
## 2013-12-29 NOTE — Progress Notes (Signed)
Pt c/o feeling dizzy and standing in hallway dozing off. Writer noted pt to be pale and diaphoretic. Pt b/p 91/58 and p 120. Pt given fluids, legs elevated and writer had pt perform Vagal maneuver. Writer reassess pt b/p, 121/85 and P 82. No complaints verbalized by pt at this time. Pt in her room talking to her parents and eating supper. Pt safety maintained. Renata Capriceonrad, NP,.  Made aware.

## 2013-12-29 NOTE — [facility] Group Notes (Signed)
[facility] LCSW Group Therapy  12/29/2013 , 12:45 PM   Type of Therapy:  Group Therapy  Participation Level:  Active  Participation Quality:  Attentive  Affect:  Appropriate  Cognitive:  Alert  Insight:  Improving  Engagement in Therapy:  Engaged  Modes of Intervention:  Discussion, Exploration and Socialization  Summary of Progress/Problems: Today's group focused on the term Diagnosis.  Participants were asked to define the term, and then pronounce whether it is a negative, positive or neutral term.  Lisa [name] did not contribute spontaneously.  However, when asked specifically, she talked about having a good support system.  She also described relationship between relapse and recovery in a more coherent way than any of the other patients who had attempted it before her.  Appears to be a bit less symptomatic than yesterday.  Lisa [name], Lisa [name] 12/29/2013 , 12:45 PM

## 2013-12-30 ENCOUNTER — Other Ambulatory Visit (HOSPITAL_COMMUNITY): Payer: 59
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
## 2013-12-30 NOTE — [facility] Suicide Risk Assessment (Signed)
[facility] INPATIENT:  Family/Significant Other Suicide Prevention Education  Suicide Prevention Education:  Education Completed;Ron Caesar Bookmanelyea, father, [phone]  has been identified by the patient as the family member/significant other with whom the patient will be residing, and identified as the person(s) who will aid the patient in the event of a mental health crisis (suicidal ideations/suicide attempt).  With written consent from the patient, the family member/significant other has been provided the following suicide prevention education, prior to the and/or following the discharge of the patient.  The suicide prevention education provided includes the following:  Suicide risk factors  Suicide prevention and interventions  National Suicide Hotline telephone number  [facility] assessment telephone number  [facility] Assistance 911  [facility] and/or [facility]idential [facility] telephone number  Request made of family/significant other to:  Remove weapons (e.g., guns, rifles, knives), all items previously/currently identified as safety concern.    Remove drugs/medications (over-the-counter, prescriptions, illicit drugs), all items previously/currently identified as a safety concern.  The family member/significant other verbalizes understanding of the suicide prevention education information provided.  The family member/significant other agrees to remove the items of safety concern listed above.  Daryel Geraldorth, Gisel Vipond B 12/30/2013, 2:09 PM

## 2013-12-30 NOTE — [facility] Group Notes (Signed)
[facility]  12/30/2013  11:30 AM  Type of Therapy:  [facility] [employer] Presentation   Participation Level:  Active  Participation Quality:  Attentive  Affect:  Appropriate  Cognitive:  Appropriate  Insight:  Engaged  Engagement in Therapy:  Engaged  Modes of Intervention:  Discussion, Education and Socialization   Summary of Progress/Problems:  [name] from [employer] came to present his recovery story and play the guitar.  Lisa [name] sat quietly and listened to the speaker tell his story.  She applauded after every song.   [name] asked the speaker about his schooling and guitar skills.    [name]   12/30/2013  11:30 AM

## 2013-12-30 NOTE — Progress Notes (Signed)
D: Patient in her room getting out of bed on approach.  Patient states she feels that she is better than she was on admisson.  Patient states she thinks due to the medications and her being on her menstrual cycles she has been tired.  Patient states she is ready for discharge.  Patient denies SI/HI and denies AVH. A: Staff to monitor Q 15 mins for safety.  Encouragement and support offered.  No scheduled medications administered per orders.  Patient refuse trazodone tonight. R: Patient remains safe on the unit.  Patient attended group tonight.  Patient visible on the unit.

## 2013-12-30 NOTE — Progress Notes (Signed)
D: Pt presents with flat affect and depressed mood. Pt denies SI/HI/AVH this morning. Pt denies feeling paranoid this morning. Per pt, "I feel like I don't know who to trust". Pt stated that she no longer feel like people are after her and no longer feel like the t.v is targeting her. Pt appears less anxious and agitated this morning. A: Medication administered as ordered per MD. Verbal support given. Pt encouraged to attend groups. 15 minute checks performed for safety. R: Pt safety maintained. Pt compliant with taking meds and attending groups.

## 2013-12-30 NOTE — [facility] Group Notes (Signed)
[facility]   12/30/2013 10:56 AM  Participation Quality:  Engaged  Mood/Affect:  Flat  Depression Rating:  2  Anxiety Rating:  2  Thoughts of Suicide:  No Will you contract for safety?   NA  Current AVH:  Denies  Plan for Discharge/Comments:  Cheri GuppyCorina looks better today.  Brighter affect, not anxious or afraid.  I commented on this, and she thanked me, and then stated that she slept well last night and is feeling much better.  Made no paranoid statements.  Stated the visit with parents last night went well.  Transportation Means: family  Supports: family  Kiribatiorth, Baldo DaubRodney B

## 2013-12-30 NOTE — Progress Notes (Signed)
Patient pulse elevated via dinamap machine this AM.  Patient states she feels fine and states she feels startled when the morning announcements come on.  Patient pulse taken manually by writer sitting 90 an standing 110.  Patient encouraged to increase fluids.  Patient sitting in dayroom with no complaints.

## 2013-12-31 ENCOUNTER — Other Ambulatory Visit (HOSPITAL_COMMUNITY): Payer: 59

## 2013-12-31 MED ORDER — FLUOXETINE HCL 20 MG PO CAPS: 20.0000 mg | ORAL_CAPSULE | Freq: Every day | ORAL | Status: DC

## 2013-12-31 MED ORDER — TRIHEXYPHENIDYL HCL 2 MG PO TABS
2.0000 mg | ORAL_TABLET | Freq: Every day | ORAL | Status: DC
Start: 2013-12-31 — End: 2015-05-24

## 2013-12-31 MED ORDER — TRAZODONE HCL 50 MG PO TABS: 50.0000 mg | ORAL_TABLET | Freq: Every day | ORAL | Status: DC

## 2013-12-31 MED ORDER — PALIPERIDONE ER 3 MG PO TB24
9.0000 mg | ORAL_TABLET | Freq: Every day | ORAL | Status: DC
  Filled 2013-12-31: qty 9

## 2013-12-31 MED ORDER — LORAZEPAM 0.5 MG PO TABS: 0.5000 mg | ORAL_TABLET | Freq: Two times a day (BID) | ORAL | Status: DC

## 2013-12-31 MED ORDER — PALIPERIDONE ER 9 MG PO TB24: 9.0000 mg | ORAL_TABLET | Freq: Every day | ORAL | Status: DC

## 2013-12-31 NOTE — Progress Notes (Signed)
Adult Psychoeducational Group Note  Date:  12/31/2013 Time:  1015  Group Topic/Focus:  Crisis Planning:   The purpose of this group is to help patients create a crisis plan for use upon discharge or in the future, as needed.  Participation Level:  Minimal  Participation Quality:  Appropriate  Affect:  Appropriate  Cognitive:  Appropriate  Insight: Appropriate  Engagement in Group:  Improving  Modes of Intervention:  Discussion and Education  Additional Comments:  PT ATTENDED GROUP AND PARTICIPATED BUT GUARDED AFFECT DURING.  Casie Sturgeon A 12/31/2013, 7:24 AM

## 2013-12-31 NOTE — Tx Team (Signed)
  Interdisciplinary Treatment Plan Update   Date Reviewed:  12/31/2013  Time Reviewed:  8:04 AM  Progress in Treatment:   Attending groups: Yes Participating in groups: minimally Taking medication as prescribed: Yes  Tolerating medication: Yes Family/Significant other contact made: Yes  Patient understands diagnosis: Yes  Discussing patient identified problems/goals with staff: Yes Medical problems stabilized or resolved: Yes Denies suicidal/homicidal ideation: Yes Patient has not harmed self or others: Yes  For review of initial/current patient goals, please see plan of care.  Estimated Length of Stay:  D/C today  Reason for Continuation of Hospitalization:   New Problems/Goals identified:  N/A  Discharge Plan or Barriers:   return home, follow up outpt  Additional Comments:    Attendees:  Signature: Thedore MinsMojeed Akintayo, MD 12/31/2013 8:04 AM   Signature: Richelle Itood Makina Skow, LCSW 12/31/2013 8:04 AM  Signature: Fransisca KaufmannLaura Davis, NP 12/31/2013 8:04 AM  Signature: Joslyn Devonaroline Beaudry, RN 12/31/2013 8:04 AM  Signature: Liborio NixonPatrice White, RN 12/31/2013 8:04 AM  Signature:  12/31/2013 8:04 AM  Signature:   12/31/2013 8:04 AM  Signature:    Signature:    Signature:    Signature:    Signature:    Signature:      Scribe for Treatment Team:   Richelle Itood Jermall Isaacson, LCSW  12/31/2013 8:04 AM

## 2013-12-31 NOTE — [facility] Suicide Risk Assessment (Signed)
Suicide Risk Assessment  Discharge Assessment     Demographic Factors:  Adolescent or young adult, Caucasian and college student  Mental Status Per Nursing Assessment::   On Admission:  NA  Current Mental Status by Physician: patient denies suicidal ideation, intent or plan  Loss Factors: NA  Historical Factors: Impulsivity  Risk Reduction Factors:   Sense of responsibility to family, Living with another person, especially a relative and Positive social support  Continued Clinical Symptoms:  Resolving delusional symptoms  Cognitive Features That Contribute To Risk:  Polarized thinking    Suicide Risk:  Minimal: No identifiable suicidal ideation.  Patients presenting with no risk factors but with morbid ruminations; may be classified as minimal risk based on the severity of the depressive symptoms  Discharge Diagnoses:   AXIS I:  Delusional disorder, NOS              Unspecified Anxiety disorder              AXIS II:  Deferred AXIS III:   Past Medical History  Diagnosis Date   AXIS IV:  other psychosocial or environmental problems and problems related to social environment AXIS V:  61-70 mild symptoms  Plan Of Care/Follow-up recommendations:  Activity:  as tolerated Diet:  healthy Tests:  routine Other:  patient to keep her after care appointment  Is patient on multiple antipsychotic therapies at discharge:  No   Has Patient had three or more failed trials of antipsychotic monotherapy by history:  No  Recommended Plan for Multiple Antipsychotic Therapies: NA  Lisa [name], Lisa Nicholl, MD 12/31/2013, 9:02 AM
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
## 2013-12-31 NOTE — Discharge Instructions (Signed)
Paranoia  Paranoia is a distrust of others that is not based on a real reason for distrust. This may reach delusional levels. This means the paranoid person feels the world is against them when there is no reason to make them feel that way. People with paranoia feel as though people around them are "out to get them".   SIMILAR MENTAL ILNESSES  · Depression is a feeling as though you are down all the time. It is normal in some situations where you have just lost a loved one. It is abnormal if you are having feelings of paranoia with it.  · Dementia is a physical problem with the brain in which the brain no longer works properly. There are problems with daily activities of living. Alzheimer's disease is one example of this. Dementia is also caused by old age changes in the brain which come with the death of brain cells and small strokes.  · Paranoidschizophrenia. People with paranoid schizophrenia and persecutory delusional disorder have delusions in which they feel people around them are plotting against them. Persecutory delusions in paranoid schizophrenia are bizarre, sometimes grandiose, and often accompanied by auditory hallucinations. This means the person is hearing voices that are not there.  · Delusionaldisorder (persecutory type). Delusions experienced by individuals with delusional disorder are more believable than those experienced by paranoid schizophrenics; they are not bizarre, though still unjustified. Individuals with delusional disorder may seem offbeat or quirky rather than mentally ill, and therefore, may never seek treatment.  All of these problems usually do not allow these people to interact socially in an acceptable manner.  CAUSES  The cause of paranoia is often not known. It is common in people with extended abuse of:  · Cocaine.  · Amphetamine.  · Marijuana.  · Alcohol.  Sometimes there is an inherited tendency. It may be associated with stress or changes in brain chemistry.  DIAGNOSIS    When paranoia is present, your caregiver may:  · Refer you to a specialist.  · Do a physical exam.  · Perform other tests on you to make sure there are not other problems causing the paranoia including:  · Physical problems.  · Mental problems.  · Chemical problems (other than drugs).  Testing may be done to determine if there is a psychiatric disability present that can be treated with medicine.  TREATMENT   · Paranoia that is a symptom of a psychiatric problem should be treated by professionals.  · Medicines are available which can help this disorder. Antipsychotic medicine may be prescribed by your caregiver.  · Sometimes psychotherapy may be useful.  · Conditions such as depression or drug abuse are treated individually. If the paranoia is caused by drug abuse, a treatment facility may be helpful. Depression may be helped by antidepressants.  PROGNOSIS   · Paranoid people are difficult to treat because of their belief that everyone is out to get them or harm them. Because of this mistrust, they often must be talked into entering treatment by a trusted family member or friend. They may not want to take medicine as they may see this as an attempt to poison them.  · Gradual gains in the trust of a therapist or caregiver helps in a successful treatment plan.  · Some people with PPD or persecutory delusional disorder function in society without treatment in limited fashion.  Document Released: 11/22/2003 Document Revised: 02/11/2012 Document Reviewed: 07/27/2008  ExitCare® Patient Information ©2014 [employer].

## 2013-12-31 NOTE — Progress Notes (Signed)
Patient ID: Arman FilterCorina Ganaway, female   DOB: 15-Jan-1991, 23 y.o.   MRN: [id number]  Morning Wellness Group 10:00  The focus of this group is to educate the patient on the purpose and policies of crisis stabilization and provide a format to answer questions about their admission.  The group details unit policies and expectations of patients while admitted.  Patient attended group and was able to verbalize understanding of the leisure and lifestyle change importance. Patient reported that she likes to sing, play piano, and listen to music to relax. Patient paid attention and was engaged during group.

## 2013-12-31 NOTE — Progress Notes (Signed)
Patient ID: [name], female   DOB: 12-11-1990, 23 y.o.   MRN: [id number]  Pt. Denies SI/HI and A/V hallucinations. Scheduled medications given to patient per physician's orders. Patient was seen in the milieu and was going to groups until time of discharge. Belongings returned to patient at time of discharge. Patient denies any pain or discomfort. Discharge instructions and medications were reviewed with patient. Patient verbalized understanding of both medications and discharge instructions. Q15 minute safety checks were maintained until discharge. Patient showed no signs of distress at time of discharge. Parents were there to pick patient up.

## 2013-12-31 NOTE — Progress Notes (Signed)
[facility] :  Will you be returning to the same living situation after discharge: Yes,  Family At discharge, do you have transportation home?:Yes,  Family Do you have the ability to pay for your medications:Yes,  Insurance  Release of information consent forms completed and in the chart;  Patient's signature needed at discharge.  Patient to Follow up at: Follow-up Information   Follow up with [facility] On 01/28/2014. (2:00PM with Dr Jannifer FranklinAkintayo)    Contact information:    [address]  [address]  [place]  [336] [place] [phone]          Follow up with [facility] On 01/05/2014. (Tuesday at 2:00 with Angus Palmsegina Alexander)    Contact information:   [address]  [place]  [336] [phone]      Patient denies SI/HI:   Yes,  yes    Safety Planning and Suicide Prevention discussed:  Yes,  yes  Simona HuhYang, Tina 12/31/2013, 8:26 AM

## 2014-01-01 ENCOUNTER — Other Ambulatory Visit (HOSPITAL_COMMUNITY): Payer: 59

## 2014-01-04 ENCOUNTER — Other Ambulatory Visit (HOSPITAL_COMMUNITY): Payer: 59

## 2014-01-05 ENCOUNTER — Other Ambulatory Visit (HOSPITAL_COMMUNITY): Payer: 59

## 2014-01-05 NOTE — Progress Notes (Signed)
Patient Discharge Instructions:  After Visit Summary (AVS):   Faxed to:  01/05/14 Discharge Summary Note:   Faxed to:  01/05/14 Psychiatric Admission Assessment Note:   Faxed to:  01/05/14 Suicide Risk Assessment - Discharge Assessment:   Faxed to:  01/05/14 Faxed/Sent to the Next Level Care provider:  01/05/14 Faxed to [facility] @ [phone] [facility] @ [phone]  Jerelene ReddenSheena E Argyle, 01/05/2014, 3:43 PM

## 2014-01-06 ENCOUNTER — Other Ambulatory Visit (HOSPITAL_COMMUNITY): Payer: 59

## 2014-01-07 ENCOUNTER — Other Ambulatory Visit (HOSPITAL_COMMUNITY): Payer: 59

## 2014-01-08 ENCOUNTER — Other Ambulatory Visit (HOSPITAL_COMMUNITY): Payer: 59

## 2014-01-11 ENCOUNTER — Other Ambulatory Visit (HOSPITAL_COMMUNITY): Payer: 59

## 2014-01-12 ENCOUNTER — Other Ambulatory Visit (HOSPITAL_COMMUNITY): Payer: 59

## 2014-01-13 ENCOUNTER — Other Ambulatory Visit (HOSPITAL_COMMUNITY): Payer: 59

## 2014-01-14 ENCOUNTER — Other Ambulatory Visit (HOSPITAL_COMMUNITY): Payer: 59

## 2014-01-15 ENCOUNTER — Other Ambulatory Visit (HOSPITAL_COMMUNITY): Payer: 59

## 2014-01-18 ENCOUNTER — Other Ambulatory Visit (HOSPITAL_COMMUNITY): Payer: 59

## 2014-01-19 ENCOUNTER — Other Ambulatory Visit (HOSPITAL_COMMUNITY): Payer: 59

## 2014-01-20 ENCOUNTER — Other Ambulatory Visit (HOSPITAL_COMMUNITY): Payer: 59

## 2014-01-21 ENCOUNTER — Other Ambulatory Visit (HOSPITAL_COMMUNITY): Payer: 59

## 2014-01-22 ENCOUNTER — Other Ambulatory Visit (HOSPITAL_COMMUNITY): Payer: 59

## 2014-01-25 ENCOUNTER — Other Ambulatory Visit (HOSPITAL_COMMUNITY): Payer: 59

## 2014-01-26 ENCOUNTER — Other Ambulatory Visit (HOSPITAL_COMMUNITY): Payer: 59

## 2014-01-27 ENCOUNTER — Other Ambulatory Visit (HOSPITAL_COMMUNITY): Payer: 59

## 2014-01-28 ENCOUNTER — Other Ambulatory Visit (HOSPITAL_COMMUNITY): Payer: 59

## 2014-01-29 ENCOUNTER — Other Ambulatory Visit (HOSPITAL_COMMUNITY): Payer: 59

## 2014-02-01 ENCOUNTER — Other Ambulatory Visit (HOSPITAL_COMMUNITY): Payer: 59

## 2014-02-02 ENCOUNTER — Other Ambulatory Visit (HOSPITAL_COMMUNITY): Payer: 59

## 2014-02-03 ENCOUNTER — Other Ambulatory Visit (HOSPITAL_COMMUNITY): Payer: 59

## 2014-02-04 ENCOUNTER — Other Ambulatory Visit (HOSPITAL_COMMUNITY): Payer: 59

## 2014-02-05 ENCOUNTER — Other Ambulatory Visit (HOSPITAL_COMMUNITY): Payer: 59

## 2014-02-08 ENCOUNTER — Other Ambulatory Visit (HOSPITAL_COMMUNITY): Payer: 59

## 2014-02-09 ENCOUNTER — Other Ambulatory Visit (HOSPITAL_COMMUNITY): Payer: 59

## 2014-06-23 ENCOUNTER — Other Ambulatory Visit: Payer: Self-pay | Admitting: Family Medicine

## 2014-06-23 DIAGNOSIS — E049 Nontoxic goiter, unspecified: Secondary | ICD-10-CM

## 2014-06-24 ENCOUNTER — Ambulatory Visit
Admission: RE | Admit: 2014-06-24 | Discharge: 2014-06-24 | Disposition: A | Discharge status: Home / Self Care | Payer: 59 | Source: Ambulatory Visit | Attending: Family Medicine | Admitting: Family Medicine

## 2014-06-24 DIAGNOSIS — E049 Nontoxic goiter, unspecified: Secondary | ICD-10-CM

## 2014-07-14 ENCOUNTER — Other Ambulatory Visit (HOSPITAL_COMMUNITY)
Admission: RE | Admit: 2014-07-14 | Discharge: 2014-07-14 | Disposition: A | Discharge status: Home / Self Care | Payer: 59 | Source: Ambulatory Visit | Attending: Family Medicine | Admitting: Family Medicine

## 2014-07-14 ENCOUNTER — Other Ambulatory Visit: Payer: Self-pay | Admitting: Family Medicine

## 2014-07-14 DIAGNOSIS — Z124 Encounter for screening for malignant neoplasm of cervix: Secondary | ICD-10-CM | POA: Diagnosis present

## 2014-07-15 LAB — CYTOLOGY - PAP

## 2015-05-11 ENCOUNTER — Encounter (HOSPITAL_COMMUNITY): Payer: Self-pay | Admitting: Emergency Medicine

## 2015-05-11 ENCOUNTER — Emergency Department (HOSPITAL_COMMUNITY)
Admission: EM | Admit: 2015-05-11 | Discharge: 2015-05-12 | Disposition: A | Discharge status: Other Acute Inpatient Hospital | Payer: BLUE CROSS/BLUE SHIELD | Attending: Emergency Medicine | Admitting: Emergency Medicine

## 2015-05-11 DIAGNOSIS — F419 Anxiety disorder, unspecified: Secondary | ICD-10-CM | POA: Diagnosis not present

## 2015-05-11 DIAGNOSIS — T424X5A Adverse effect of benzodiazepines, initial encounter: Secondary | ICD-10-CM | POA: Diagnosis present

## 2015-05-11 DIAGNOSIS — X58XXXA Exposure to other specified factors, initial encounter: Secondary | ICD-10-CM | POA: Insufficient documentation

## 2015-05-11 DIAGNOSIS — F3160 Bipolar disorder, current episode mixed, unspecified: Secondary | ICD-10-CM

## 2015-05-11 DIAGNOSIS — S01511A Laceration without foreign body of lip, initial encounter: Secondary | ICD-10-CM | POA: Diagnosis not present

## 2015-05-11 DIAGNOSIS — Z23 Encounter for immunization: Secondary | ICD-10-CM | POA: Diagnosis not present

## 2015-05-11 DIAGNOSIS — R000 Tachycardia, unspecified: Secondary | ICD-10-CM | POA: Insufficient documentation

## 2015-05-11 DIAGNOSIS — Y9289 Other specified places as the place of occurrence of the external cause: Secondary | ICD-10-CM | POA: Insufficient documentation

## 2015-05-11 DIAGNOSIS — F309 Manic episode, unspecified: Secondary | ICD-10-CM

## 2015-05-11 DIAGNOSIS — Y998 Other external cause status: Secondary | ICD-10-CM | POA: Insufficient documentation

## 2015-05-11 DIAGNOSIS — Z79899 Other long term (current) drug therapy: Secondary | ICD-10-CM | POA: Diagnosis not present

## 2015-05-11 DIAGNOSIS — Z720 Tobacco use: Secondary | ICD-10-CM | POA: Insufficient documentation

## 2015-05-11 DIAGNOSIS — Y9389 Activity, other specified: Secondary | ICD-10-CM | POA: Diagnosis not present

## 2015-05-11 HISTORY — DX: Bipolar disorder, unspecified: F31.9

## 2015-05-11 LAB — COMPREHENSIVE METABOLIC PANEL
ALT: 19 U/L (ref 14–54)
AST: 31 U/L (ref 15–41)
Albumin: 4.5 g/dL (ref 3.5–5.0)
Alkaline Phosphatase: 57 U/L (ref 38–126)
Anion gap: 9 (ref 5–15)
BUN: 12 mg/dL (ref 6–20)
CO2: 23 mmol/L (ref 22–32)
Calcium: 9.1 mg/dL (ref 8.9–10.3)
Chloride: 105 mmol/L (ref 101–111)
Creatinine, Ser: 0.72 mg/dL (ref 0.44–1.00)
GFR calc Af Amer: >60 mL/min (ref >60)
GFR calc non Af Amer: >60 mL/min (ref >60)
Glucose, Bld: 101 mg/dL — ABNORMAL HIGH (ref 65–99)
Potassium: 3.9 mmol/L (ref 3.5–5.1)
Sodium: 137 mmol/L (ref 135–145)
Total Bilirubin: 0.7 mg/dL (ref 0.3–1.2)
Total Protein: 7.3 g/dL (ref 6.5–8.1)

## 2015-05-11 LAB — RAPID URINE DRUG SCREEN, HOSP PERFORMED
Amphetamines: NOT DETECTED
Barbiturates: NOT DETECTED
Benzodiazepines: NOT DETECTED
Cocaine: NOT DETECTED
Opiates: NOT DETECTED
Tetrahydrocannabinol: NOT DETECTED

## 2015-05-11 LAB — CBC
HCT: 42.6 % (ref 36.0–46.0)
Hemoglobin: 15 g/dL (ref 12.0–15.0)
MCH: 31.8 pg (ref 26.0–34.0)
MCHC: 35.2 g/dL (ref 30.0–36.0)
MCV: 90.4 fL (ref 78.0–100.0)
Platelets: 237 10*3/uL (ref 150–400)
RBC: 4.71 MIL/uL (ref 3.87–5.11)
RDW: 12.6 % (ref 11.5–15.5)
WBC: 9.1 10*3/uL (ref 4.0–10.5)

## 2015-05-11 LAB — LITHIUM LEVEL: Lithium Lvl: <0.06 mmol/L — ABNORMAL LOW (ref 0.60–1.20)

## 2015-05-11 LAB — ETHANOL: Alcohol, Ethyl (B): <5 mg/dL (ref <5)

## 2015-05-11 LAB — SALICYLATE LEVEL: Salicylate Lvl: <4 mg/dL (ref 2.8–30.0)

## 2015-05-11 LAB — ACETAMINOPHEN LEVEL: Acetaminophen (Tylenol), Serum: <10 ug/mL — ABNORMAL LOW (ref 10–30)

## 2015-05-11 MED ORDER — HALOPERIDOL LACTATE 5 MG/ML IJ SOLN
INTRAMUSCULAR | Status: AC
  Administered 2015-05-11: 10 mg
  Filled 2015-05-11: qty 2

## 2015-05-11 MED ORDER — LORAZEPAM 2 MG/ML IJ SOLN
INTRAMUSCULAR | Status: AC
  Administered 2015-05-11: 2 mg
  Filled 2015-05-11: qty 1

## 2015-05-11 MED ORDER — HALOPERIDOL LACTATE 5 MG/ML IJ SOLN: 10.0000 mg | Freq: Once | INTRAMUSCULAR | Status: DC

## 2015-05-11 MED ORDER — TETANUS-DIPHTH-ACELL PERTUSSIS 5-2.5-18.5 LF-MCG/0.5 IM SUSP
0.5000 mL | Freq: Once | INTRAMUSCULAR | Status: AC
  Administered 2015-05-11: 0.5 mL via INTRAMUSCULAR
  Filled 2015-05-11: qty 0.5

## 2015-05-11 MED ORDER — PALIPERIDONE ER 3 MG PO TB24
3.0000 mg | ORAL_TABLET | Freq: Every day | ORAL | Status: DC
  Administered 2015-05-11 – 2015-05-12 (×2): 3 mg via ORAL
  Filled 2015-05-11 (×2): qty 1

## 2015-05-11 MED ORDER — LORAZEPAM 0.5 MG PO TABS
0.5000 mg | ORAL_TABLET | Freq: Every day | ORAL | Status: DC
  Administered 2015-05-11: 0.5 mg via ORAL
  Filled 2015-05-11: qty 1

## 2015-05-11 MED ORDER — LORAZEPAM 2 MG/ML IJ SOLN: 1.0000 mg | Freq: Once | INTRAMUSCULAR | Status: DC

## 2015-05-11 MED ORDER — SODIUM CHLORIDE 0.9 % IV BOLUS (SEPSIS)
1000.0000 mL | Freq: Once | INTRAVENOUS | Status: AC
  Administered 2015-05-11: 1000 mL via INTRAVENOUS

## 2015-05-11 MED ORDER — BENZTROPINE MESYLATE 1 MG PO TABS
0.5000 mg | ORAL_TABLET | Freq: Every day | ORAL | Status: DC
  Administered 2015-05-11 – 2015-05-12 (×2): 0.5 mg via ORAL
  Filled 2015-05-11 (×2): qty 1

## 2015-05-11 MED ORDER — LORAZEPAM 2 MG/ML IJ SOLN: 2.0000 mg | Freq: Once | INTRAMUSCULAR | Status: DC
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
## 2015-05-11 NOTE — BH Assessment (Signed)
[facility] Assessment Progress Note  The following facilities have been contacted to seek placement for this pt, with results as noted:  Beds available, information sent, decision pending:  [facility]  At capacity[facility]  [facility]   Desmund [place], [place] Triage Specialist [phone]

## 2015-05-11 NOTE — ED Notes (Signed)
Patient was being uncooperative with staff, she was allowing blood to be drawn. She had her right hand up in the air, blowing on her arm, and talking about the blood flow in her arm. Earlier, pt would not allow Dr. Mora Bellmanni to assess her completely.
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
## 2015-05-11 NOTE — ED Notes (Signed)
Dr. Mora Bellmanni is aware that she has received her medication. TTS is at bedside. IV will be started and blood drawn once patient is more cooperative and TTS has completed her exam. Pt is hollering out, "I am tired", "I am very weak".
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
## 2015-05-11 NOTE — ED Notes (Signed)
Pt acting manic in triage  Pt singing loudly and talking to registration  Pt aggressive with her parents in lobby  Pt asking to take a shower and talking about taking food out of her fridge and going to buy new food  Pt having flight of ideas  Parents state pt pt has psych history and has not been on her meds

## 2015-05-11 NOTE — ED Notes (Signed)
Patient drowsy, cooperative. Denies SI, HI, AVH. Denies complaint. Minimal interaction at present.  Patient oriented to the unit.  Q 15 safety checks in place.

## 2015-05-11 NOTE — ED Notes (Signed)
Patients belongings: Manson PasseyBrown pocket book, a black cell phone,a multi color wallet, [place] id, black net spend premier visa debit card, check book, passport, bb&t debit card, multi color book bag, sun glasses, purple socks, hair clip. Grey sweater grey sweat pants, black panties, turquoise bag, purple umbrella, school books,6 books another pair of sunglasses and a bible and an ipod. Multi color bag inside the bookbag.
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
## 2015-05-11 NOTE — BH Assessment (Signed)
Assessment completed. Consulted Donell SievertSpencer Simon, PA-C who recommended inpatient treatment. Dr.Oni has been informed of the recommendation.

## 2015-05-11 NOTE — ED Notes (Signed)
Bed: [id number] Expected date:  Expected time:  Means of arrival:  Comments: Hold for Res B

## 2015-05-11 NOTE — ED Notes (Signed)
Pt states she was prescribed lithium yesterday and took a dose Tuesday around 6pm and another dose later  Pt states after that she smoked a hand rolled cigarette  Pt states shortly there after she started feeling very light headed and dizzy  Pt states she got very weak and short of breath  Pt states she told the person with her she needed to come to the hospital  Pt states on the way here she became sweaty  Pt states her symptoms are resolving as time passes

## 2015-05-12 ENCOUNTER — Inpatient Hospital Stay (HOSPITAL_COMMUNITY)
Admission: AD | Admit: 2015-05-12 | Discharge: 2015-05-24 | DRG: 885 | Disposition: A | Discharge status: Home / Self Care | Payer: BLUE CROSS/BLUE SHIELD | Source: Intra-hospital | Attending: Psychiatry | Admitting: Psychiatry

## 2015-05-12 ENCOUNTER — Encounter (HOSPITAL_COMMUNITY): Payer: Self-pay | Admitting: *Deleted

## 2015-05-12 DIAGNOSIS — Z62810 Personal history of physical and sexual abuse in childhood: Secondary | ICD-10-CM | POA: Diagnosis present

## 2015-05-12 DIAGNOSIS — F3160 Bipolar disorder, current episode mixed, unspecified: Secondary | ICD-10-CM | POA: Diagnosis not present

## 2015-05-12 DIAGNOSIS — F17210 Nicotine dependence, cigarettes, uncomplicated: Secondary | ICD-10-CM | POA: Diagnosis present

## 2015-05-12 DIAGNOSIS — Z9119 Patient's noncompliance with other medical treatment and regimen: Secondary | ICD-10-CM | POA: Diagnosis present

## 2015-05-12 DIAGNOSIS — F419 Anxiety disorder, unspecified: Secondary | ICD-10-CM | POA: Diagnosis present

## 2015-05-12 DIAGNOSIS — E221 Hyperprolactinemia: Secondary | ICD-10-CM | POA: Diagnosis present

## 2015-05-12 DIAGNOSIS — F312 Bipolar disorder, current episode manic severe with psychotic features: Secondary | ICD-10-CM | POA: Diagnosis present

## 2015-05-12 DIAGNOSIS — Z818 Family history of other mental and behavioral disorders: Secondary | ICD-10-CM

## 2015-05-12 DIAGNOSIS — F309 Manic episode, unspecified: Secondary | ICD-10-CM | POA: Diagnosis not present

## 2015-05-12 MED ORDER — ALUM & MAG HYDROXIDE-SIMETH 200-200-20 MG/5ML PO SUSP: 30.0000 mL | ORAL | Status: DC | PRN

## 2015-05-12 MED ORDER — ONDANSETRON HCL 4 MG PO TABS
4.0000 mg | ORAL_TABLET | Freq: Three times a day (TID) | ORAL | Status: DC | PRN
  Filled 2015-05-12: qty 1

## 2015-05-12 MED ORDER — LORAZEPAM 1 MG PO TABS
1.0000 mg | ORAL_TABLET | Freq: Three times a day (TID) | ORAL | Status: DC | PRN
  Filled 2015-05-12: qty 1

## 2015-05-12 MED ORDER — ACETAMINOPHEN 325 MG PO TABS
650.0000 mg | ORAL_TABLET | Freq: Four times a day (QID) | ORAL | Status: DC | PRN
  Administered 2015-05-20: 650 mg via ORAL
  Filled 2015-05-12: qty 2

## 2015-05-12 MED ORDER — IBUPROFEN 200 MG PO TABS: 600.0000 mg | ORAL_TABLET | Freq: Three times a day (TID) | ORAL | Status: DC | PRN

## 2015-05-12 MED ORDER — DIPHENHYDRAMINE HCL 50 MG PO CAPS
50.0000 mg | ORAL_CAPSULE | ORAL | Status: AC
  Administered 2015-05-12: 50 mg via ORAL
  Filled 2015-05-12: qty 2
  Filled 2015-05-12: qty 1

## 2015-05-12 MED ORDER — DIPHENHYDRAMINE HCL 25 MG PO CAPS: 50.0000 mg | ORAL_CAPSULE | Freq: Four times a day (QID) | ORAL | Status: DC | PRN

## 2015-05-12 MED ORDER — TRAZODONE HCL 50 MG PO TABS
50.0000 mg | ORAL_TABLET | Freq: Every evening | ORAL | Status: DC | PRN
  Filled 2015-05-12: qty 1

## 2015-05-12 MED ORDER — BENZTROPINE MESYLATE 1 MG PO TABS
1.0000 mg | ORAL_TABLET | Freq: Two times a day (BID) | ORAL | Status: DC
  Administered 2015-05-12: 1 mg via ORAL
  Filled 2015-05-12: qty 1

## 2015-05-12 MED ORDER — ACETAMINOPHEN 325 MG PO TABS
650.0000 mg | ORAL_TABLET | ORAL | Status: DC | PRN
  Filled 2015-05-12: qty 2

## 2015-05-12 MED ORDER — MAGNESIUM HYDROXIDE 400 MG/5ML PO SUSP: 30.0000 mL | Freq: Every day | ORAL | Status: DC | PRN

## 2015-05-12 MED ORDER — LORAZEPAM 0.5 MG PO TABS
0.5000 mg | ORAL_TABLET | Freq: Every day | ORAL | Status: DC
  Administered 2015-05-12: 0.5 mg via ORAL
  Filled 2015-05-12: qty 1

## 2015-05-12 MED ORDER — WHITE PETROLATUM GEL: Status: DC | PRN

## 2015-05-12 MED ORDER — NICOTINE 21 MG/24HR TD PT24: 21.0000 mg | MEDICATED_PATCH | Freq: Every day | TRANSDERMAL | Status: DC

## 2015-05-12 MED ORDER — WHITE PETROLATUM GEL
Status: DC | PRN
  Filled 2015-05-12: qty 28.35

## 2015-05-12 MED ORDER — ZOLPIDEM TARTRATE 5 MG PO TABS: 5.0000 mg | ORAL_TABLET | Freq: Every evening | ORAL | Status: DC | PRN

## 2015-05-12 MED ORDER — TRAZODONE HCL 50 MG PO TABS: 50.0000 mg | ORAL_TABLET | Freq: Every evening | ORAL | Status: DC | PRN

## 2015-05-12 MED ORDER — PALIPERIDONE ER 3 MG PO TB24
3.0000 mg | ORAL_TABLET | Freq: Every day | ORAL | Status: DC
  Administered 2015-05-13: 3 mg via ORAL
  Filled 2015-05-12 (×2): qty 1

## 2015-05-12 MED ORDER — BENZTROPINE MESYLATE 1 MG PO TABS
1.0000 mg | ORAL_TABLET | Freq: Two times a day (BID) | ORAL | Status: DC
  Administered 2015-05-13: 1 mg via ORAL
  Filled 2015-05-12 (×3): qty 1

## 2015-05-12 NOTE — ED Notes (Signed)
During physician rounds pt. Was very irritable.  She was unable to multitask and asked doctor to come back after she ate her breakfast.  Her mood was very labile as she changed from elation to crying when talking about passing out.  She asked that she remain voluntary if she is to be admitted to inpatient treatment.  She denies AVH.  15 minute checks remain in place for pt. Safety.

## 2015-05-12 NOTE — ED Notes (Signed)
Patient denies SI, HI, AVH. Denies feelings of anxiety and depression. Patient has slurred speech and reports her tongue is "thick". Cogentin increased to 1mg . Given.   Encouragement offered.  Q 15 safety checks continue.

## 2015-05-12 NOTE — ED Notes (Signed)
Patient encouraged to relax. Offered medication for anxiety, nausea, and headache. Patient states "I'm okay now. I can lie down." Declines medications at present.   Q 15 safety checks continue.

## 2015-05-12 NOTE — Progress Notes (Signed)
Psychoeducational Group Note  Date:  05/12/2015 Time:  2349  Group Topic/Focus:  Wrap-Up Group:   The focus of this group is to help patients review their daily goal of treatment and discuss progress on daily workbooks.  Participation Level: Did Not Attend  Participation Quality:  Not Applicable  Affect:  Not Applicable  Cognitive:  Not Applicable  Insight:  Not Applicable  Engagement in Group: Not Applicable  Additional Comments:  The patient did not attend group this evening since she was admitted to hallway after it had ended.   Hazle Coca S 05/12/2015, 11:49 PM

## 2015-05-12 NOTE — BH Assessment (Addendum)
[facility] Assessment Progress Note  This pt has requested to leave the ED.  After having staffed this with Nanine Means, NP, Thedore Mins, MD and EDP Tilden Fossa, MD, it has been determined that pt meets criteria for IVC, which Dr. Madilyn Hook has initiated.  Petition has been faxed to the [facility] office, and Hortense Ramal has acknowledged receipt.  Service of the Findings and Custody Order is pending at this time.  Per Thurman Coyer, RN, [facility], pt has been accepted to [facility], Rm 505-1 with a caveat that she is not to be transported until after 17:00.  Pt's nurse, Kendal Hymen, has been notified, and agrees to call report to [phone].  Pt is to be transported via [employer].  Doylene Canning, MA Triage Specialist [phone]

## 2015-05-12 NOTE — ED Notes (Signed)
Alert. Appeared confused. Reports that she is thirsty and has a headache. States "I feel like I need more sleep".  Patient given beverage and snack. Declined medication intervention for headache.  Q 15 safety checks continue.
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
## 2015-05-12 NOTE — ED Notes (Signed)
Patient had episode of emesis. Patient states she is not comfortable and wants a different room. Patient restless. Continues to get in and out of bed and pace.  Encouragement offered.   Q 15 safety checks continue.

## 2015-05-12 NOTE — ED Notes (Signed)
Involuntary tongue movement observed.  MD notified.

## 2015-05-13 ENCOUNTER — Encounter (HOSPITAL_COMMUNITY): Payer: Self-pay | Admitting: Psychiatry

## 2015-05-13 DIAGNOSIS — F312 Bipolar disorder, current episode manic severe with psychotic features: Secondary | ICD-10-CM | POA: Diagnosis present

## 2015-05-13 MED ORDER — LORAZEPAM 0.5 MG PO TABS
0.5000 mg | ORAL_TABLET | Freq: Every evening | ORAL | Status: DC | PRN
  Administered 2015-05-13 – 2015-05-17 (×4): 0.5 mg via ORAL
  Filled 2015-05-13 (×6): qty 1

## 2015-05-13 MED ORDER — TRAZODONE HCL 50 MG PO TABS
50.0000 mg | ORAL_TABLET | Freq: Every evening | ORAL | Status: DC | PRN
  Filled 2015-05-13 (×9): qty 1

## 2015-05-13 MED ORDER — OLANZAPINE 5 MG PO TBDP
5.0000 mg | ORAL_TABLET | Freq: Three times a day (TID) | ORAL | Status: DC | PRN
  Administered 2015-05-15 (×2): 5 mg via ORAL
  Filled 2015-05-13 (×3): qty 1

## 2015-05-13 MED ORDER — BENZTROPINE MESYLATE 1 MG PO TABS
1.0000 mg | ORAL_TABLET | Freq: Every day | ORAL | Status: DC
  Administered 2015-05-13 – 2015-05-16 (×4): 1 mg via ORAL
  Filled 2015-05-13 (×7): qty 1

## 2015-05-13 MED ORDER — LAMOTRIGINE 25 MG PO TABS
25.0000 mg | ORAL_TABLET | Freq: Every day | ORAL | Status: DC
  Administered 2015-05-13 – 2015-05-17 (×5): 25 mg via ORAL
  Filled 2015-05-13 (×7): qty 1

## 2015-05-13 MED ORDER — OLANZAPINE 10 MG IM SOLR: 5.0000 mg | Freq: Three times a day (TID) | INTRAMUSCULAR | Status: DC | PRN

## 2015-05-13 MED ORDER — NICOTINE POLACRILEX 2 MG MT GUM
CHEWING_GUM | OROMUCOSAL | Status: AC
  Administered 2015-05-13: 12:00:00
  Filled 2015-05-13: qty 1

## 2015-05-13 MED ORDER — ARIPIPRAZOLE 5 MG PO TABS
5.0000 mg | ORAL_TABLET | Freq: Every day | ORAL | Status: DC
  Administered 2015-05-13: 5 mg via ORAL
  Filled 2015-05-13 (×4): qty 1
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
## 2015-05-13 NOTE — Tx Team (Addendum)
Initial Interdisciplinary Treatment Plan   PATIENT STRESSORS: Educational concerns Medication change or noncompliance   PATIENT STRENGTHS: Average or above average intelligence Physical Health Supportive family/friends   PROBLEM LIST: Problem List/Patient Goals Date to be addressed Date deferred Reason deferred Estimated date of resolution  Psychosis " I feel like the iron in the door is putting something in my body" 05/13/15           Risk for suicide                                           DISCHARGE CRITERIA:  Ability to meet basic life and health needs Improved stabilization in mood, thinking, and/or behavior Motivation to continue treatment in a less acute level of care Verbal commitment to aftercare and medication compliance  PRELIMINARY DISCHARGE PLAN: Participate in family therapy Return to previous living arrangement Return to previous work or school arrangements  PATIENT/FAMIILY INVOLVEMENT: This treatment plan has been presented to and reviewed with the patient, Lisa [name], and/or family member.  The patient and family have been given the opportunity to ask questions and make suggestions.  Roselie Skinner [facility] 05/13/2015, 1:39 AM

## 2015-05-13 NOTE — Plan of Care (Signed)
Problem: Alteration in thought process Goal: LTG-Patient behavior demonstrates decreased signs psychosis Pt presents with agitation, mood lability, disorganization and paranoia. Goal not met. R north LCSW 05/13/2015 4:10 PM  Outcome: Progressing Pt denies AVH at this time even though pt appears hypo-manic, pt does not show signs of psychosis at this time.   Problem: Ineffective individual coping Goal: LTG: Patient will report a decrease in negative feelings Outcome: Progressing Pt stated she felt better today     

## 2015-05-13 NOTE — Progress Notes (Signed)
D: Pt denies SI/HI/AVH. Pt is pleasant and cooperative.Pt is labile and hypo-manic. Pt appears to be in denial about her situation. Pt blames a lot of people/ things for her situation but herself. Pt appears to tell you what she thinks you want to hear. Pt stated she felt "better today"  A: Pt was offered support and encouragement. Pt was given scheduled medications. Pt was encourage to attend groups. Q 15 minute checks were done for safety.   R:Pt attends groups and interacts well with peers and staff. Pt is taking medication. Pt has no complaints at this time .Pt receptive to treatment and safety maintained on unit.
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
## 2015-05-13 NOTE — Tx Team (Signed)
Interdisciplinary Treatment Plan Update (Adult)  Date:  05/13/2015   Time Reviewed:  8:12 AM   Progress in Treatment: Attending groups: Yes. Participating in groups:  Yes. Taking medication as prescribed:  Yes. Tolerating medication:  Yes. Family/Significant othe contact made: Yes  Patient understands diagnosis:  Yes  As evidenced by seeking help with "this thing that feels like a contusion inside of me" Discussing patient identified problems/goals with staff:  Yes, see initial care plan. Medical problems stabilized or resolved:  Yes. Denies suicidal/homicidal ideation: Yes. Issues/concerns per patient self-inventory:  No. Other:  New problem(s) identified:  Discharge Plan or Barriers:  Return home, follow up outpt  Reason for Continuation of Hospitalization: Delusions  Mania Medication stabilization Other; describe Paranoia  Comments:  Pt acting manic in triage Pt singing loudly and talking to registration Pt aggressive with her parents in lobby Pt asking to take a shower and talking about taking food out of her fridge and going to buy new food Pt having flight of ideas Parents state pt pt has psych history and has not been on her meds Abilify, Lamictal trial  Estimated length of stay: 4-5 days  New goal(s):  Review of initial/current patient goals per problem list:     Attendees: Patient:  05/13/2015 8:12 AM   Family:   05/13/2015 8:12 AM   Physician:  Jomarie Longs, MD 05/13/2015 8:12 AM   Nursing:   Shelda Jakes, RN 05/13/2015 8:12 AM   CSW:    Daryel Gerald, LCSW   05/13/2015 8:12 AM   Other:  05/13/2015 8:12 AM   Other:   05/13/2015 8:12 AM   Other:  Onnie Boer, Nurse CM 05/13/2015 8:12 AM   Other:  Leisa Lenz, [facility] 05/13/2015 8:12 AM   Other:  Tomasita Morrow, P4CC  05/13/2015 8:12 AM   Other:  05/13/2015 8:12 AM   Other:  05/13/2015 8:12 AM   Other:  05/13/2015 8:12 AM   Other:  05/13/2015 8:12 AM   Other:  05/13/2015 8:12 AM   Other:   05/13/2015 8:12 AM     Scribe for Treatment Team:   Ida Rogue, 05/13/2015 8:12 AM

## 2015-05-13 NOTE — Progress Notes (Addendum)
D Ross is seen UAL on the unit this morning. Her behavior is seen as labile...one minute she is calm and quiet and the next she is singing loudly in the middle of the day room and interrupting others' conversations . Her speech is pressured. She is impulsive. She dmonstrates disorganized  Thinking and word salad. A She demonstrates manipulative , gamey behaviors as evidenced by her observed  By staff...asking MHT for something and then when ee told her " no" she immediately approached a different staff member .  R Invega is dc'd by MD . Pt  is  Scheduled to receive first dose of abilify tonight and was started on lamictal at 1200. Safety is in place.  Addendum: MD reports to nurse that pt got kicked out of SW's group today due to " a meltdown". Pt observed practicing yoga steps as she stood in front of counter at nurses' station..   On 500 hall pt assisted to move away from nurses station, she is performing yoga stretches exactly in the middle of patient traffic area. She will not follow nurses' prompting to move out of direct traffic flow way and is  given 5 mg zyprexa prn and is seen sitting in the hallway, at phone, about 45 min later quietly dozing.[name]

## 2015-05-14 LAB — LIPID PANEL
Cholesterol: 143 mg/dL (ref 0–200)
HDL: 42 mg/dL (ref >40)
LDL Cholesterol: 77 mg/dL (ref 0–99)
Total CHOL/HDL Ratio: 3.4 RATIO
Triglycerides: 120 mg/dL (ref <150)
VLDL: 24 mg/dL (ref 0–40)

## 2015-05-14 LAB — TSH: TSH: 1.238 u[IU]/mL (ref 0.350–4.500)

## 2015-05-14 LAB — PREGNANCY, URINE: Preg Test, Ur: NEGATIVE

## 2015-05-14 MED ORDER — MUSCLE RUB 10-15 % EX CREA
TOPICAL_CREAM | CUTANEOUS | Status: DC | PRN
  Filled 2015-05-14: qty 85

## 2015-05-14 MED ORDER — ARIPIPRAZOLE 10 MG PO TABS
10.0000 mg | ORAL_TABLET | Freq: Every day | ORAL | Status: DC
  Administered 2015-05-14: 10 mg via ORAL
  Filled 2015-05-14 (×2): qty 1

## 2015-05-14 MED ORDER — METHOCARBAMOL 500 MG PO TABS
500.0000 mg | ORAL_TABLET | Freq: Three times a day (TID) | ORAL | Status: DC | PRN
Start: 2015-05-14 — End: 2015-05-24
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
## 2015-05-14 NOTE — Progress Notes (Signed)
[name] remains manic in her behaviors as evidenced by her labile mood, her impulsiveness, lack of boundaries, her inability to stay out of other patients' business. She has poor insight. She is easily agitated and has diff taking redirection. A She has changed her clothes 7 ( seven) times today. SHe left her afternoon group 4 times . She got VERY agitated, starting screaming when this nurse offered her prn zyprexa and she refuses to talk about it any further. She is overheard on the patient hall phone, making accusations ( untrue) of nursing staff. R UPT negative. Pt ordered robaxin and muscle rub ( she has refused this offered by this nurse)  Safetyin place.

## 2015-05-14 NOTE — [facility] Group Notes (Signed)
[facility] Group Notes:  (Nursing/MHT/Case Management/Adjunct)  Date:  05/14/2015  Time:  2:49 PM  Type of Therapy:  Nurse Education  Participation Level:  Active  Participation Quality:  Monopolizing  Affect:  Labile  Cognitive:  Alert  Insight:  Limited  Engagement in Group:  Monopolizing  Modes of Intervention:  Confrontation    Summary of Progress/Problems:  Rich Brave 05/14/2015, 2:49 PM

## 2015-05-14 NOTE — Plan of Care (Signed)
Problem: Alteration in thought process Goal: LTG-Patient has not harmed self or others in at least 2 days Outcome: Progressing Pt has been safe past 2 days Goal: STG-Patient is able to sleep at least 6 hours per night Outcome: Progressing Pt slept 6.25 hrs last night

## 2015-05-14 NOTE — Progress Notes (Signed)
Pt has been acting very labile and disrespectful and agitated along with the pt [name] which requires constant re-direction.

## 2015-05-14 NOTE — Progress Notes (Signed)
D: Pt denies SI/HI/AVH. Pt is pleasant and cooperative. Pt continues to be very labile with her mood. Pt seen up and down on the milieu talking to peers . Pt is paranoid at times and very suspicious. Pt continues to have hypo-manic speech as well as pressured at times. Pt stated "I feel my cardiovascular system burning and my respiratory system burning. Pt vitals were taken and : Bp sitting 146/94 P-94, Bp standing 147/88 and O2 100% . Pt has been pacing the halls during the evening. Pt very labile, pt wanted to see every label on each medication. Pt seemed to get worked up while talking to pt [employer].   A: Pt was offered support and encouragement. Pt was given scheduled medications. Pt was encourage to attend groups. Q 15 minute checks were done for safety.   R:Pt attends groups and interacts well with peers and staff. Pt is taking medication. Pt has no complaints at this time .Pt receptive to treatment and safety maintained on unit.
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
## 2015-05-14 NOTE — Progress Notes (Signed)
Adult Psychoeducational Group Note  Date:  05/14/2015 Time:  8:59 PM  Group Topic/Focus:  Wrap-Up Group:   The focus of this group is to help patients review their daily goal of treatment and discuss progress on daily workbooks.  Participation Level:  Active  Participation Quality:  Appropriate  Affect:  Appropriate  Cognitive:  Appropriate  Insight: Appropriate  Engagement in Group:  Engaged  Modes of Intervention:  Discussion  Additional Comments: The patient expressed that in group they discussed healthy and unhealthy to them.The patient also expressed her needs for tonight.  Octavio Manns 05/14/2015, 8:59 PM

## 2015-05-14 NOTE — Progress Notes (Signed)
Pt is very argumentative, controversial and defiant. Pt wants to challenge everything that is said and that has to be done. Pt very labile  And will go from 0-100 in no time .

## 2015-05-15 MED ORDER — OLANZAPINE 10 MG PO TBDP
10.0000 mg | ORAL_TABLET | Freq: Three times a day (TID) | ORAL | Status: DC | PRN
  Administered 2015-05-15 – 2015-05-23 (×6): 10 mg via ORAL
  Filled 2015-05-15 (×6): qty 1

## 2015-05-15 MED ORDER — TRAZODONE HCL 100 MG PO TABS
100.0000 mg | ORAL_TABLET | Freq: Every day | ORAL | Status: DC
  Administered 2015-05-15 – 2015-05-16 (×2): 100 mg via ORAL
  Filled 2015-05-15 (×4): qty 1

## 2015-05-15 MED ORDER — ARIPIPRAZOLE 15 MG PO TABS
15.0000 mg | ORAL_TABLET | Freq: Every day | ORAL | Status: DC
  Administered 2015-05-15 – 2015-05-16 (×2): 15 mg via ORAL
  Filled 2015-05-15 (×3): qty 1

## 2015-05-15 MED ORDER — ZOLPIDEM TARTRATE 5 MG PO TABS
5.0000 mg | ORAL_TABLET | Freq: Every evening | ORAL | Status: DC | PRN
  Filled 2015-05-15: qty 1

## 2015-05-15 MED ORDER — OLANZAPINE 10 MG IM SOLR: 10.0000 mg | Freq: Three times a day (TID) | INTRAMUSCULAR | Status: DC | PRN

## 2015-05-15 MED ORDER — GABAPENTIN 100 MG PO CAPS
100.0000 mg | ORAL_CAPSULE | ORAL | Status: DC
  Administered 2015-05-15 – 2015-05-17 (×6): 100 mg via ORAL
  Filled 2015-05-15 (×12): qty 1

## 2015-05-15 NOTE — Progress Notes (Signed)
Pt continued to harass staff all night with incoherent nonsense. Pt continued to ask staff nonsense questions even when pt was obviously sleepy and very tired. Pt needed constant re-direction. Pt presents with flight of ideas and loose associations.

## 2015-05-15 NOTE — Progress Notes (Signed)
Pt stated she felt like she was poisoned. Writer asked how do you know , what type of symptoms are you experiencing. Pt walked away and returned.

## 2015-05-15 NOTE — Progress Notes (Signed)
Pt continues to be labile argumentative, and paranoid. Pt came to med window stating " I feel anxious, i'm tired what do I need". Writer suggested Zyprexa. Pt took Zyprexa in hand but did not want to take it, pt pt pill in mouth and walked away from window, when called pt back, pt did not respond. Pt appeared to take the pill.

## 2015-05-15 NOTE — BHH Group Notes (Signed)
[facility] Group Notes:  (Clinical Social Work)  05/15/2015  [facility] Group Notes:  (Clinical Social Work)  05/15/2015  11:00AM-12:00PM  Summary of Progress/Problems:  The main focus of today's process group was to listen to a variety of genres of music and to identify that different types of music provoke different responses.  Patients then would be able to identify personally what was soothing for them, as well as energizing.  There were intermittent discussions about feelings evoked, as well as how patient can personally use this knowledge in sleep habits, with depression, and with other symptoms.  The patient was in and out of the room continuously (at least 15 times), at various times trying to prop the door open and challenging CSW as to why it should be closed (for the sake of other patients trying to rest), then alternatively demanding that the music be turned down (for the sake of others, although has never been an issue).  Other patients had a difficult time tolerating her interruptions, and she did not respond well to CSW's attempts to redirect her, was irritable and labile.  When she did sit in the room next to another young female pt, she sobbed uncontrollably while being hugged by the other patient, then got up and left the room with no tears on her face.    Type of Therapy:  Music Therapy   Participation Level:  Minimal  Participation Quality:  Limited  Affect:  Blunted  Cognitive:  Disorganized, Delusional  Insight:  None  Engagement in Therapy:  None  Modes of Intervention:   Activity, Exploration  Ambrose Mantle, LCSW 05/15/2015
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
## 2015-05-15 NOTE — Progress Notes (Signed)
D Anushka cont to be fragile. Her  behavior remains erratic, labile and indicative of the mania she is experiencing. She questions all statements made, by staff and by any other people. She  Cont to exhibit disorganized thought process with thougth blocking, word salad, the inability to carry on thoughtful goal oriented conversation. She demonstrates polarized thinking. At one point this afternoon, she was seen talking on the patient hall pohone and called this nurse over and handed me the phone. The person on the other end was 911 of [facility] and operator said " Is everything ok there..". This nurse explained this was [facility] and all was well on hall...  with the patient. Patient went from hall to patient dayroom and was overheard by this Emergency planning/management officer information and requesting Adult Management consultant number.    A Patient demonstrates inability to process and becomes EXTREMELY agitated when this writer asks her how she is feeling today and can she explain her feelings to this Clinical research associate.Marland KitchenMarland KitchenShe screams" who do you think YOU are..  what business is it of yours....you make me sick and disgusted....are you a scientist? Are you a chemist? Are you a lawyer? [name]... NO and your giving me medicines that are PUTTING lead into my system and making my chest heavy!!!!!Who do you think you are. Pt allowed nurse to give her prn zyprexa this AM ( no relief identified by patient and / or nurse afterwards. Patient given bag of belongings from her family and she is adamant that belongings are NOT hers and she refuses to take them to he room and leaves entire bag sitting in the hall.\   R Cont with poc. maintian safety.

## 2015-05-15 NOTE — Progress Notes (Signed)
Adult Psychoeducational Group Note  Date:  05/15/2015 Time:  9:30 PM  Group Topic/Focus:  Wrap-Up Group:   The focus of this group is to help patients review their daily goal of treatment and discuss progress on daily workbooks.  Participation Level:  Active  Participation Quality:  Appropriate  Affect:  Appropriate  Cognitive:  Appropriate  Insight: Appropriate  Engagement in Group:  Engaged  Modes of Intervention:  Discussion  Additional Comments: The patient expressed that she attended Music Therapy.The patient also said that music helps her release stress.  Octavio Manns 05/15/2015, 9:30 PM

## 2015-05-16 LAB — HEMOGLOBIN A1C
Hgb A1c MFr Bld: 5.2 % (ref 4.8–5.6)
Mean Plasma Glucose: 103 mg/dL

## 2015-05-16 LAB — PROLACTIN: Prolactin: 77.4 ng/mL — ABNORMAL HIGH (ref 4.8–23.3)

## 2015-05-16 MED ORDER — NICOTINE 7 MG/24HR TD PT24
7.0000 mg | MEDICATED_PATCH | Freq: Every day | TRANSDERMAL | Status: DC
  Filled 2015-05-16 (×4): qty 1

## 2015-05-16 NOTE — Plan of Care (Signed)
Problem: Diagnosis: Increased Risk For Suicide Attempt Goal: LTG-Patient Will Report Improved Mood and Deny Suicidal LTG (by discharge) Patient will report improved mood and deny suicidal ideation.  Outcome: Progressing Pt denies suicidal ideation     

## 2015-05-16 NOTE — [facility] Group Notes (Signed)
[facility] LCSW Group Therapy  05/16/2015 1:15 pm  Type of Therapy: Process Group Therapy  Participation Level:  Active  Participation Quality:  Appropriate  Affect:  Flat  Cognitive:  Oriented  Insight:  Improving  Engagement in Group:  Limited  Engagement in Therapy:  Limited  Modes of Intervention:  Activity, Clarification, Education, Problem-solving and Support  Summary of Progress/Problems: Today's group addressed the issue of overcoming obstacles.  Patients were asked to identify their biggest obstacle post d/c that stands in the way of their on-going success, and then problem solve as to how to manage this.  Pt started out in group.  We did a mindfullness exercise at the beginning, and she walked out while it was in progress and did not return.  Daryel Gerald B 05/16/2015   4:05 PM

## 2015-05-16 NOTE — Progress Notes (Signed)
D: Pt presents delusional, disorganized, and paranoid. " I feel like I'm being manipulated". Pt was oriented back to reality. Pt was cautious during med pass. Pt requested for writer to name the pills in the cup. Pt was previously informed of her scheduled qhs medications. Writer named the pills in the cup to help ease this pt's paranoia.  Pt is currently negative for any SI/HI/AVH.  A: Writer administered scheduled and prn medications to pt, per MD orders. Continued support and availability as needed was extended to this pt. Staff continue to monitor pt with q17min checks.  R: No adverse drug reactions noted. Pt receptive to treatment. Pt remains safe at this time.

## 2015-05-16 NOTE — [facility] Group Notes (Signed)
[facility] LCSW Aftercare Discharge Planning Group Note   05/16/2015 3:08 PM  Participation Quality:  Engaged  Mood/Affect:  Irritable  Depression Rating:  denies  Anxiety Rating:  denies  Thoughts of Suicide:  No Will you contract for safety?   NA  Current AVH:  Denies  Plan for Discharge/Comments:  Our conversation started out well enough, but she quickly became agitated and angry with me even though I was only repeating her words back to her.  Quickly felt overwhelmed about the subject material, even though she was the one bringing it up.  She left, came back and apologized, and then got angry with me again for what she perceived as an inaccurate statement, yelled and left again.  Did not return.  Transportation Means:   Supports:  Daryel Gerald B

## 2015-05-16 NOTE — Progress Notes (Signed)
Patients mother at bedside visiting with patient.

## 2015-05-16 NOTE — Plan of Care (Signed)
Problem: Diagnosis: Increased Risk For Suicide Attempt Goal: STG-Patient Will Comply With Medication Regime Outcome: Progressing Patient compliant with medication regimen     

## 2015-05-16 NOTE — Progress Notes (Signed)
Paranoid during medication pass. Requesting to see all packaging of medication, slow to take medication commenting on water in cup and the bubbles.

## 2015-05-16 NOTE — Progress Notes (Signed)
[facility] MD Progress Note  05/16/2015 7:29 PM Lisa [name]  MRN:  [id number] Subjective:  Lisa [name] is still presenting somewhat disorganized in obtaining history she confessed that she did not feel comfortable sharing information with this MD as she did not know me. But she did ask what was the purpose of taking the Lamictal and the Neurontin if they were both same family. She was also concerned about her previous lithium prescription as one said carbonate and the other bicarbonate " "I am no chemist I don't understand."  Principal Problem: Bipolar disorder, current episode manic severe with psychotic features Diagnosis:   Patient Active Problem List   Diagnosis Date Noted  . Bipolar disorder, current episode manic severe with psychotic features [F31.2] 05/13/2015   Total Time spent with patient: 20 minutes   Past Medical History:  Past Medical History  Diagnosis Date  . Anxiety   . Depression   . Bipolar disorder     Past Surgical History  Procedure Laterality Date  . Extraction of wisdom teeth     Family History:  Family History  Problem Relation Age of Onset  . Schizophrenia Mother   . Depression Father   . Seizures Sister    Social History:  History  Alcohol Use No     History  Drug Use No    History   Social History  . Marital Status: Single    Spouse Name: N/A  . Number of Children: N/A  . Years of Education: N/A   Social History Main Topics  . Smoking status: Current Every Day Smoker -- 0.25 packs/day    Types: Cigarettes  . Smokeless tobacco: Not on file  . Alcohol Use: No  . Drug Use: No  . Sexual Activity: Yes     Comment: Plan B Pill   Other Topics Concern  . None   Social History Narrative   Additional History:    Sleep: Fair  Appetite:  Fair   Assessment:   Musculoskeletal: Strength & Muscle Tone: within normal limits Gait & Station: normal Patient leans: normal   Psychiatric Specialty Exam: Physical Exam  Review of Systems   Constitutional: Negative.   HENT: Negative.   Eyes: Negative.   Respiratory: Negative.   Cardiovascular: Negative.   Gastrointestinal: Negative.   Genitourinary: Negative.   Musculoskeletal: Negative.   Skin: Negative.   Endo/Heme/Allergies: Negative.   Psychiatric/Behavioral: The patient is nervous/anxious.     Blood pressure 136/88, pulse 113, temperature 97.8 F (36.6 C), temperature source Oral, resp. rate 20, height  (1.626 m), weight 61.689 kg (136 lb), last menstrual period 05/12/2015.Body mass index is 23.33 kg/(m^2).  General Appearance: Fairly Groomed  Patent attorney::  Fair  Speech:  Clear and Coherent  Volume:  fluctuates  Mood:  Anxious and Irritable  Affect:  Restricted  Thought Process:  Disorganized and Tangential  Orientation:  Full (Time, Place, and Person)  Thought Content:  symptoms events worries concerns  Suicidal Thoughts:  No  Homicidal Thoughts:  No  Memory:  Immediate;   Fair Recent;   Fair Remote;   Fair  Judgement:  Impaired  Insight:  Lacking  Psychomotor Activity:  Restlessness  Concentration:  Poor  Recall:  Poor  Fund of Knowledge:Fair  Language: Fair  Akathisia:  No  Handed:  Right  AIMS (if indicated):     Assets:  Desire for Improvement  ADL's:  Intact  Cognition: WNL  Sleep:  Number of Hours: 6.5     Current Medications: Current  Facility-Administered Medications  Medication Dose Route Frequency Provider Last Rate Last Dose  . acetaminophen (TYLENOL) tablet 650 mg  650 mg Oral Q6H PRN Charm Rings, NP      . alum & mag hydroxide-simeth (MAALOX/MYLANTA) 200-200-20 MG/5ML suspension 30 mL  30 mL Oral Q4H PRN Charm Rings, NP      . ARIPiprazole (ABILIFY) tablet 15 mg  15 mg Oral QHS Jomarie Longs, MD   15 mg at 05/15/15 2112  . benztropine (COGENTIN) tablet 1 mg  1 mg Oral QHS Jomarie Longs, MD   1 mg at 05/15/15 2112  . gabapentin (NEURONTIN) capsule 100 mg  100 mg Oral [id number] Saramma Eappen, MD   100 mg at 05/16/15  1620  . lamoTRIgine (LAMICTAL) tablet 25 mg  25 mg Oral Daily Jomarie Longs, MD   25 mg at 05/16/15 0748  . LORazepam (ATIVAN) tablet 0.5 mg  0.5 mg Oral QHS PRN Jomarie Longs, MD   0.5 mg at 05/16/15 0050  . magnesium hydroxide (MILK OF MAGNESIA) suspension 30 mL  30 mL Oral Daily PRN Charm Rings, NP      . methocarbamol (ROBAXIN) tablet 500 mg  500 mg Oral Q8H PRN Jomarie Longs, MD      . MUSCLE RUB CREA   Topical PRN Jomarie Longs, MD      . nicotine (NICODERM CQ - dosed in mg/24 hr) patch 7 mg  7 mg Transdermal Daily Jomarie Longs, MD   7 mg at 05/16/15 0954  . OLANZapine zydis (ZYPREXA) disintegrating tablet 10 mg  10 mg Oral TID PRN Jomarie Longs, MD   10 mg at 05/16/15 0749   Or  . OLANZapine (ZYPREXA) injection 10 mg  10 mg Intramuscular TID PRN Jomarie Longs, MD      . traZODone (DESYREL) tablet 100 mg  100 mg Oral QHS Jomarie Longs, MD   100 mg at 05/15/15 2112  . white petrolatum (VASELINE) gel   Topical PRN Charm Rings, NP      . zolpidem (AMBIEN) tablet 5 mg  5 mg Oral QHS PRN Jomarie Longs, MD        Lab Results: No results found for this or any previous visit (from the past 48 hour(s)).  Physical Findings: AIMS: Facial and Oral Movements Muscles of Facial Expression: None, normal Lips and Perioral Area: None, normal Jaw: None, normal Tongue: None, normal,Extremity Movements Upper (arms, wrists, hands, fingers): None, normal Lower (legs, knees, ankles, toes): None, normal, Trunk Movements Neck, shoulders, hips: None, normal, Overall Severity Severity of abnormal movements (highest score from questions above): None, normal Incapacitation due to abnormal movements: None, normal Patient's awareness of abnormal movements (rate only patient's report): No Awareness, Dental Status Current problems with teeth and/or dentures?: No Does patient usually wear dentures?: No  CIWA:    COWS:     Treatment Plan Summary: Daily contact with patient to assess and evaluate  symptoms and progress in treatment and Medication management Supportive approach/coping skills Work to improve reality testing Mood instability; will continue the current medication regime and allow the medications to get in her system. From admission there seems to be some improvement Discussed the rationale behind using Lamictal and Neurontin Will continue Abilify as reports no side effects and seems she slept better  Medical Decision Making:  Review of Psycho-Social Stressors (1), Review or order clinical lab tests (1), Review of Medication Regimen & Side Effects (2) and Review of New Medication or Change in Dosage (2)  Lizandro Spellman A 05/16/2015, 7:29 PM

## 2015-05-16 NOTE — Progress Notes (Addendum)
D: Per patient self inventory form, pt reports she slept good last night. She reports a fair appetite with normal energy level. She reports depression 3/10, hopelessness 3/10 and anxiety 5/10; all on 1-10 scale, 10 being the worse. Pt reports she is withdrawling from tobacco, but refuses to accept the nicotine patch. On approach patient is very manic, presents with much anxiety. Pacing the hallway, very difficult to redirect. Disorganized thought content, tangential, responding to internal stimuli. Paranoid in regards to her medication regimen.   A: Medication administered per MD order. PRN medication given for anxiety. Encouragement, counseling, and redirection provided. Special checks q 15 mins in place for safety.   R: Difficult to redirect. Compliant with medication regimen with much encouragement. Safety maintained.

## 2015-05-16 NOTE — Progress Notes (Signed)
Adult Psychoeducational Group Note  Date:  05/16/2015 Time:  10:21 PM  Group Topic/Focus:  Wrap-Up Group:   The focus of this group is to help patients review their daily goal of treatment and discuss progress on daily workbooks.  Participation Level:  Active  Participation Quality:  Appropriate  Affect:  Labile  Cognitive:  Appropriate  Insight: Lacking  Engagement in Group:  Engaged  Modes of Intervention:  Socialization and Support  Additional Comments:  Patient attended and participated in group tonight. She reports having a unusual day. She has been processing a lot of things. She talked to her mom today. She did slept well last night. She went for her meals. Dinner was good. She went to her groups  Scot Dock 05/16/2015, 10:21 PM

## 2015-05-17 DIAGNOSIS — E221 Hyperprolactinemia: Secondary | ICD-10-CM | POA: Clinically undetermined

## 2015-05-17 MED ORDER — ARIPIPRAZOLE 10 MG PO TABS
20.0000 mg | ORAL_TABLET | Freq: Every day | ORAL | Status: DC
  Filled 2015-05-17 (×2): qty 2

## 2015-05-17 MED ORDER — ARIPIPRAZOLE 10 MG PO TABS
20.0000 mg | ORAL_TABLET | Freq: Every evening | ORAL | Status: DC
  Administered 2015-05-17 – 2015-05-23 (×6): 20 mg via ORAL
  Filled 2015-05-17 (×8): qty 2

## 2015-05-17 MED ORDER — LAMOTRIGINE 25 MG PO TABS
50.0000 mg | ORAL_TABLET | Freq: Every day | ORAL | Status: DC
  Administered 2015-05-18 – 2015-05-24 (×6): 50 mg via ORAL
  Filled 2015-05-17 (×9): qty 2

## 2015-05-17 MED ORDER — ZOLPIDEM TARTRATE 5 MG PO TABS: 5.0000 mg | ORAL_TABLET | Freq: Every evening | ORAL | Status: DC | PRN

## 2015-05-17 MED ORDER — LORAZEPAM 1 MG PO TABS
1.0000 mg | ORAL_TABLET | Freq: Once | ORAL | Status: AC
  Administered 2015-05-17: 1 mg via ORAL
  Filled 2015-05-17: qty 1

## 2015-05-17 MED ORDER — GABAPENTIN 100 MG PO CAPS
200.0000 mg | ORAL_CAPSULE | ORAL | Status: DC
  Administered 2015-05-17 – 2015-05-23 (×15): 200 mg via ORAL
  Filled 2015-05-17 (×28): qty 2

## 2015-05-17 MED ORDER — BENZTROPINE MESYLATE 1 MG PO TABS
1.0000 mg | ORAL_TABLET | Freq: Every evening | ORAL | Status: DC
  Administered 2015-05-17 – 2015-05-23 (×6): 1 mg via ORAL
  Filled 2015-05-17 (×8): qty 1

## 2015-05-17 MED ORDER — ZOLPIDEM TARTRATE 10 MG PO TABS
10.0000 mg | ORAL_TABLET | Freq: Every day | ORAL | Status: DC
  Filled 2015-05-17: qty 1

## 2015-05-17 NOTE — Progress Notes (Signed)
Adult Psychoeducational Group Note  Date:  05/17/2015 Time:  0900  Group Topic/Focus:  Recovery Goals:   The focus of this group is to identify appropriate goals for recovery and establish a plan to achieve them.  Participation Level:  Active  Participation Quality:  Appropriate  Affect:  Appropriate  Cognitive:  Appropriate  Insight: Appropriate  Engagement in Group:  Improving  Modes of Intervention:  Education  Additional Comments:    Ellaina Schuler L 05/17/2015, 10:59 AM

## 2015-05-17 NOTE — Progress Notes (Addendum)
Patient ID: Lisa [name], female   DOB: 1991-04-21, 24 y.o.   MRN: [id number]   Pt currently presents with a flat affect and labile, intrusive behavior. Per self inventory, pt rates depression at a 5, hopelessness 7 and anxiety 3. Pt's daily goal is to "get better and go home and see my roommate, our dog, my family and other loved ones" and they intend to do so by "you tell me because I have no idea." Pt reports poor sleep and writes "when I did sleep it was good but I didn't sleep very long." Pt also reports poor and good concentration, a low energy level and a fair appetite.   Pt provided with medications per providers orders. Pt's labs and vitals were monitored throughout the day. Pt supported emotionally and encouraged to express concerns and questions. Pt educated on medications and diet/nutrition. Pt reported an episode of increased anxiety post consult to Clinical research associate. Pt given a 1:1 and given prn medication, see MAR.   Pt's safety ensured with 15 minute and environmental checks. Pt currently denies SI/HI and A/V hallucinations. Pt verbally agrees to seek staff if SI/HI or A/VH occurs and to consult with staff before acting on these thoughts. Will continue POC. Pt mood remains labile. Pt still paranoid about staff intentions and says things like "I hope they want to do the best for me, I dunno." Pt refused EKG after a period of increased anxiety. Pt stated "the sticker shocked me, there are ions in the air that could explode and hurt me. I don't like feeling like Im a car. I need to get out of here." Pt emotionally supported and escorted back to the unit.

## 2015-05-17 NOTE — [facility] Group Notes (Signed)
[facility] LCSW Group Therapy  05/17/2015 1:15 pm  Type of Therapy: Process Group Therapy  Participation Level:  Active  Participation Quality:  Appropriate  Affect:  Flat  Cognitive:  Oriented  Insight:  Improving  Engagement in Group:  Limited  Engagement in Therapy:  Limited  Modes of Intervention:  Activity, Clarification, Education, Problem-solving and Support  Summary of Progress/Problems: Today's group addressed the issue of overcoming obstacles.  Patients were asked to identify their biggest obstacle post d/c that stands in the way of their on-going success, and then problem solve as to how to manage this. Engaged throughout.  Admitted to difficulties with emotional regulation.  Came up with strange thoughts.  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  Still gets frutsrated by attempting to express her thoughts,  and then not being able to.  "I can't talk right now.  I'm thinking clearly, but I can't express it well right now."    Daryel Gerald B 05/17/2015   10:46 AM
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
## 2015-05-17 NOTE — Progress Notes (Signed)
D: Pt continues to present paranoid and disorganized. Pt requested to see the packet of her sleep medication. "I have trust issues". This is a frequent behavior from pt. Pt is observed pacing the halls periodically through the night. Pt refused writer's offer for some Ativan at 0009. Pt was reminded on how the prn dose of Ativan assisted her in getting a good night rest last night. Pt was visible within the milieu but with minimal interactions with others. A: Writer administered scheduled medications to pt, per MD orders. Continued support and availability as needed was extended to this pt. Staff continue to monitor pt with q46min checks.  R: No adverse drug reactions noted. Pt receptive to treatment. Pt remains safe at this time.

## 2015-05-18 MED ORDER — ZOLPIDEM TARTRATE 10 MG PO TABS
10.0000 mg | ORAL_TABLET | Freq: Every day | ORAL | Status: DC
  Administered 2015-05-18: 10 mg via ORAL
  Filled 2015-05-18: qty 1

## 2015-05-18 MED ORDER — ARIPIPRAZOLE 5 MG PO TABS
5.0000 mg | ORAL_TABLET | Freq: Every day | ORAL | Status: DC
  Administered 2015-05-19 – 2015-05-24 (×5): 5 mg via ORAL
  Filled 2015-05-18 (×7): qty 1

## 2015-05-18 NOTE — [facility] Group Notes (Signed)
[facility] LCSW Aftercare Discharge Planning Group Note   05/18/2015 1:51 PM  Participation Quality:  Engaged  Mood/Affect:  Irritable  Depression Rating:  denies  Anxiety Rating:  "You are making me nervous."  Thoughts of Suicide:  No Will you contract for safety?   NA  Current AVH:  denies  Plan for Discharge/Comments:  Wanted to talk about heritage.  "Where are you from?"  Explained that her mother is [place].  "And I think I am part [place]. Or maybe my name is Fannie Knee.  I wish I could remember."  Later stated that she was sure staff is spiking the coffee with cocaine, and accused me of trying to hypnotize her with my pen.  Ended up leaving.  Transportation Means:   Supports:  Daryel Gerald B
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
## 2015-05-18 NOTE — [facility] Group Notes (Signed)
[facility] [employer] Group Therapy  05/18/2015 , 1:55 PM    Type of Therapy:  [employer] Presentation  Participation Level:  Active  Participation Quality:  Attentive  Affect:  Blunted  Cognitive:  Oriented  Insight:  Limited  Engagement in Therapy:  Engaged  Modes of Intervention:  Discussion, Education and Socialization  Summary of Progress/Problems:  [name] from [employer] came to present his recovery story and play the guitar.  Left group multiple times.  Each time she returned, she chose to sit in a different chair.  Clapped at the end of each number, and engaged the presenter about instruments at the end [name] plays a cello.]  Lisa [name] B 05/18/2015 , 1:55 PM
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
## 2015-05-18 NOTE — Plan of Care (Signed)
Problem: Alteration in thought process Goal: STG-Patient is able to follow short directions Outcome: Progressing Patient is able to follow short directions with prompting. Patient does continue to need redirection.

## 2015-05-18 NOTE — Progress Notes (Signed)
D: Pt noted to be delusional, disorganized, and tangential. Pt also continued to present manic in behavior. Pt refused her qhs medications. "I'll come to you If I need it". Pt was previously receptive to taking her medications. Writer reviewed the medication changes with pt at the beginning of the shift. Pt verbalized that she was aware of the changes as discussed with her psychiatrist. Pt woke up in the middle of the night pacing the halls. Pt's gait was unsteady and she appeared drowsy. Pt was encouraged to return to her bed for some additional rest (and as a safety precaution). Pt eventually returned but made at least 3 additional trips to the nursing station. Pt wanted to discuss the process of how bruises healed on one occasion. Pt was informed that such conversation will only help keep her awake. Pt was reminded that she refused her qhs medication that may have helped her insomnia. Pt's affect became angry as she verbalized that she did. Writer reiterated the fact that they weren't given.  A: Continued support and availability as needed was extended to this pt. Staff continue to monitor pt with q68min checks.  R: Pt not receptive to treatment. Pt remains safe at this time.

## 2015-05-18 NOTE — Clinical Social Work Note (Signed)
CSW met w patient, patient expressed multiple concerns re who/what to trust in her environment, lack of certainty about what is real, difficulty in sorting out delusions and "things I see/hear that others do not", distress about being given a diagnosis of schizophrenia "like my mother", paranoia about medications being offered and their possible side effects (fear of having a seizure "like my sister").  CSW validated patient's underlying feelings of anxiety and fear, discussed ways to begin to feel safe and less anxious in present moment.  Patient states she uses music and reading as coping mechanisms, willing to learn additional self soothing and emotion regulation techniques from rec therapist.  At end of intervention, patient's thoughts were more organized and logical, patient aware that her irritability and overwhelmed reactions may be irritants to others, and wants help in maintaining emotional control.    Lisa Shell, LCSW Clinical Social Worker
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
## 2015-05-18 NOTE — Progress Notes (Signed)
Adult Psychoeducational Group Note  Date:  05/18/2015 Time:  10:24 PM  Group Topic/Focus:  Wrap-Up Group:   The focus of this group is to help patients review their daily goal of treatment and discuss progress on daily workbooks.  Participation Level:  Minimal  Participation Quality:  Appropriate  Affect:  Labile  Cognitive:  Appropriate  Insight: Appropriate  Engagement in Group:  Engaged  Modes of Intervention:  Socialization and Support  Additional Comments:  Patient attended and participated in group tonight. She reports that she went for meals and attended her groups. She watch television and went outside. On the television was a scary movie. She advised that she socialized with peers in the cafeteria. One of the person she meet in the cafeteria reminded her of someone she knew. She had a flashback about the friend and became emotional. She spoke with her nurse about it and went to the quiet room.  Lita Mains [facility] 05/18/2015, 10:24 PM

## 2015-05-18 NOTE — Progress Notes (Signed)
D:Patient in the hallway on approach.  Patient states, "I had a great day."  Patient states she ate well and she talked to people.  Patient states her parents visited and they want her to move back home.  Patient states she feels like she can live on her own.  Patient denies SI/HI and denies AVH.  Patient speech tangential and patient exhibits paranoid behavior. A: Staff to monitor Q 15 mins for safety.  Encouragement and support offered.  Scheduled medications administered per orders. R: Patient remains safe on the unit.  Patient attended group tonight.  Patient visible on the unit and interacting with peers.  Patient taking administered medications.

## 2015-05-19 MED ORDER — QUETIAPINE FUMARATE 25 MG PO TABS
25.0000 mg | ORAL_TABLET | Freq: Every evening | ORAL | Status: DC | PRN
  Administered 2015-05-19: 25 mg via ORAL
  Filled 2015-05-19 (×6): qty 1
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
## 2015-05-19 NOTE — Progress Notes (Signed)
D:Pt is paranoid and watchful taking her medications. Pt is pacing saying that she may be a ghost and that there is radiation on the hall. "They are tying to breed me" and pt goes from crying to laughing out loud.  A:Gave pt prn medication for agitation and pt is staying on the unit during outside time. R:Safety maintained on the unit.

## 2015-05-19 NOTE — Progress Notes (Signed)
Pt refused warm compresses stating not needed. Pt reports that she has Vaseline in her room that she is using for dry lips.

## 2015-05-19 NOTE — Tx Team (Signed)
Interdisciplinary Treatment Plan Update (Adult)  Date:  05/19/2015   Time Reviewed:  10:54 AM   Progress in Treatment: Attending groups: Yes. Participating in groups:  Yes. Taking medication as prescribed:  Yes. Tolerating medication:  Yes. Family/Significant othe contact made:  Yes Patient understands diagnosis:  Yes   Discussing patient identified problems/goals with staff:  Yes, see initial care plan. Medical problems stabilized or resolved:  Yes. Denies suicidal/homicidal ideation: Yes. Issues/concerns per patient self-inventory:  No. Other:  New problem(s) identified:  Discharge Plan or Barriers:  Return home, follow up outpt  Reason for Continuation of Hospitalization: Anxiety Medication stabilization Other; describe Paranoia, disorganization  Comments:  Pt this AM . Pt appeared to more organized than yesterday , she is more linear in her thought process, able to respond to questions appropriately. Pt with sleep issues - did not tolerate the Ambien well last night. Pt continues to have paranoia , when it comes to some staff members , mostly female as well as taking her medications. However over all seems to be making progress on her current medication regimen. Pt denies SI/HI/AH/VH.  Seroquel trial for sleep  Estimated length of stay: 2-4 days  New goal(s):  Review of initial/current patient goals per problem list:     Attendees: Patient:  05/19/2015 10:54 AM   Family:   05/19/2015 10:54 AM   Physician:  Jomarie Longs, MD 05/19/2015 10:54 AM   Nursing:   Waynetta Sandy, RN 05/19/2015 10:54 AM   CSW:    Daryel Gerald, LCSW   05/19/2015 10:54 AM   Other:  05/19/2015 10:54 AM   Other:   05/19/2015 10:54 AM   Other:  Onnie Boer, Nurse CM 05/19/2015 10:54 AM   Other:  Leisa Lenz, [facility] 05/19/2015 10:54 AM   Other:  Tomasita Morrow, P4CC  05/19/2015 10:54 AM   Other:  05/19/2015 10:54 AM   Other:  05/19/2015 10:54 AM   Other:  05/19/2015 10:54 AM   Other:  05/19/2015 10:54  AM   Other:  05/19/2015 10:54 AM   Other:   05/19/2015 10:54 AM    Scribe for Treatment Team:   Ida Rogue, 05/19/2015 10:54 AM

## 2015-05-19 NOTE — Progress Notes (Signed)
[facility] Group Notes:  (Nursing/MHT/Case Management/Adjunct)  Date:  05/19/2015  Time:  12:11 PM  Type of Therapy:  Nurse Education  Participation Level:  Active  Participation Quality:  Intrusive  Affect:  Anxious  Cognitive:  Alert  Insight:  Improving  Engagement in Group:  Engaged  Modes of Intervention:  Discussion, Socialization and Support  Summary of Progress/Problems:Pt reports that her goal for today is to respect others and herself by taking a shower, changing linen, exercising and deep breathing.   Lisa [name] 05/19/2015, 12:11 PM

## 2015-05-19 NOTE — Progress Notes (Signed)
Pt attended evening karaoke group and sang 2 songs.

## 2015-05-19 NOTE — Progress Notes (Signed)
Recreation Therapy Notes  06.16.16 $RemoveB'@1145'[id number]$  am  LRT met with patient to go over some stress management techniques.  LRT and patient did some deep breathing and progressive muscle relaxation.  Patient stated she felt better after doing the deep breathing exercise  Patient went on to say she felt light and that she was going to turn into a helium filled balloon and float away after doing the progressive muscle relaxation exercise.  Patient needed limited redirection.  Patient was more focused and less distracted.  LRT will follow up with patient.  Victorino Sparrow, LRT/CTRS    Ria Comment, Jaqualyn Juday A 05/19/2015 1:26 PM

## 2015-05-19 NOTE — Progress Notes (Signed)
D:Patient in the dayroom on approach.  Patient states she had a good day but states she was anxious today.  Patient states she is glad she was taken off Ambien and states she will nw try Seroquel. Patient still is displaying paranoid behavior and blames her  mother for her anxiety.  Patient denies SI/HI and denies AVH. A: Staff to monitor Q 15 mins for safety.  Encouragement and support offered.  Scheduled medications administered per orders. R: Patient remains safe on the unit.  Patient attended group tonight. Patient visible on the unit and interacting with peers.  Patient taking administered medications.

## 2015-05-20 MED ORDER — QUETIAPINE FUMARATE 50 MG PO TABS
50.0000 mg | ORAL_TABLET | Freq: Every day | ORAL | Status: DC
  Administered 2015-05-20 – 2015-05-22 (×3): 50 mg via ORAL
  Filled 2015-05-20 (×6): qty 1

## 2015-05-20 NOTE — Progress Notes (Signed)
[facility] Group Notes:  (Nursing/MHT/Case Management/Adjunct)  Date:  05/20/2015  Time:  9:21 PM  Type of Therapy:  Psychoeducational Skills  Participation Level:  Active  Participation Quality:  Appropriate  Affect:  Appropriate  Cognitive:  Appropriate  Insight:  Improving  Engagement in Group:  Lacking  Modes of Intervention:  Education  Summary of Progress/Problems: Patient was bright and cheerful in group this evening. She states that she had a good day since she had a good talk with her parents. Patient characterized the interaction with her parents as being a "responsible conversation" but did not explain what took place. As a theme for the day, her relapse prevention will include getting more rest since since she admits to having more energy at night and thus remains awake.   Hazle Coca S 05/20/2015, 9:21 PM

## 2015-05-20 NOTE — Progress Notes (Signed)
D:Patient in her room on approach.  Patient states she had dynamic day.  Patient was asked to elaborate by Clinical research associate.  Patient states she had a good day.  Patient states she ate well and attended groups.  Patient states states she continues to work on her goal which was to respect others. Patient is hypervigilant and paranoid still.  Patient suspicious of medications and asked to see packages of medications several times before taking them. Patient denies SI/HI and denies AVH A: Staff to monitor Q 15 mins for safety.  Encouragement and support offered.  Scheduled medications administered per orders. R: Patient remains safe on the unit.  Patient attended group tonight.  Patient visible on the unit and interacting with peers.  Patient taking administered medications

## 2015-05-20 NOTE — [facility] Group Notes (Signed)
[facility] LCSW Group Therapy  05/20/2015  1:05 PM  Type of Therapy:  Group therapy  Participation Level:  Active  Participation Quality:  Attentive  Affect:  Flat  Cognitive:  Oriented  Insight:  Limited  Engagement in Therapy:  Limited  Modes of Intervention:  Discussion, Socialization  Summary of Progress/Problems:  Chaplain was here to lead a group on themes of hope and courage. "Hope is lighting a candle in the dark so that you can see.  It's like if you are in a tunnel, but you can't see the light at the end, so this will get you to it.  Or on church in the dark you can pass the lighted candle on to someone else."  Went on to talk about how change can either be positive or negative, but it has a lot to do with how we react to our circumstances, be they good or bad.   Asked the leader pointedly what he believes about hope. Then went on in a tangential vein about black holes, rabbit holes and "a tree falling in the forest." which she somehow related to how we interpret reality.  Realized as she was rambling on that it was increasing her anxiety, and she excused herself. Daryel Gerald B 05/20/2015 12:04 PM

## 2015-05-20 NOTE — [facility] Group Notes (Signed)
[facility] LCSW Aftercare Discharge Planning Group Note   05/20/2015 12:01 PM  Participation Quality:  Engaged  Mood/Affect:  Appropriate  Depression Rating:  denies  Anxiety Rating:  denies  Thoughts of Suicide:  No Will you contract for safety?   NA  Current AVH:  No  Plan for Discharge/Comments:  Still a bit bizarre.  Makes a statement, then gives details about why she made the statement, as if feeling the need to explain everything she says.  Engages other patients in what almost feels like a kind of parallel conversation.  Mood is good.  Enjoyed karaoke last night  [employer]:   Supports:  [place], [place] B
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
## 2015-05-20 NOTE — Progress Notes (Signed)
Recreation Therapy Notes  06.17.16 @ 1435 pm LRT met with patient about stress management.  Patient stated that the deep breathing has been helping her.  Patient stated it was hard for her to do the progressive muscle relaxation by herself.  Patient also stated she started feeling stressed in an earlier group, so she went into her room and did some deep breathing and laid down.  LRT attempted to introduce guided imagery but patient wasn't ready to try it because she couldn't focus and concentrate.  Patient still needed redirection to focus and get on task.  LRT will follow up.   Victorino Sparrow, LRT/CTRS    Victorino Sparrow A 05/20/2015 4:42 PM

## 2015-05-20 NOTE — Plan of Care (Signed)
Problem: Ineffective individual coping Goal: STG: Patient will remain free from self harm Outcome: Progressing Patient has not engaged in self harm and denies SI  Problem: Diagnosis: Increased Risk For Suicide Attempt Goal: STG-Patient Will Report Suicidal Feelings to Staff Outcome: Progressing Denying SI

## 2015-05-20 NOTE — Progress Notes (Addendum)
Patient up and visible in the milieu. Remains bizarre in behavior and speech. Brought this Clinical research associate to her room asking numerous questions about lights, light switches, and the heating and AC unit. Wondering out loud if she should have maintenance come answer her questions. Overheard patient asking peer, "when you flush your toilet, does it smell good?" Affect anxious. She remains paranoid, especially regarding medications which were given per orders. She indicates her goal for today is to "be healthy and be in unity with my family, friends and loved ones." She plans to accomplish this goal by "doing some pushups and hugging myself.' Patient told story of putting ketchup and mayo on her skin to ease the redness. Skin assessed and is WDL. Support and reassurance given. Denies SI/HI/AVH. Will continue to monitor closely. Lawrence Marseilles

## 2015-05-21 DIAGNOSIS — E221 Hyperprolactinemia: Secondary | ICD-10-CM

## 2015-05-21 DIAGNOSIS — F312 Bipolar disorder, current episode manic severe with psychotic features: Principal | ICD-10-CM

## 2015-05-21 MED ORDER — LORAZEPAM 0.5 MG PO TABS
0.5000 mg | ORAL_TABLET | Freq: Four times a day (QID) | ORAL | Status: DC | PRN
  Administered 2015-05-22 – 2015-05-24 (×3): 0.5 mg via ORAL
  Filled 2015-05-21 (×3): qty 1

## 2015-05-21 MED ORDER — NICOTINE POLACRILEX 2 MG MT GUM
CHEWING_GUM | OROMUCOSAL | Status: AC
  Filled 2015-05-21: qty 1

## 2015-05-21 NOTE — Progress Notes (Addendum)
Patient is labile, intrusive and irritable this morning. Mood is hypomanic and patient is frequently argumentative. Demanding items of staff frequently. Asking about a dictionary brought in by family and locker was checked. No dictionary found however art materials, Bible and fiction book were brought back for her as patient is quite restless. Patient looked at items and said, "no, I threw those out. Someone retrieved them from the trash. Throw them away. I am paranoid of them." Patient also somatic stating, "my chest feels funny. I need an EKG. They tried to do one earlier this week but I was afraid of the voltages." Patient denying anxiety however presents as such. Lungs auscultated. Clear breath sounds bilat, RR and pulse WDL. EKG completed and indicates "normal ECG, NSR." Patient given meds, redirected as needed. Offered support and reassurance but patient remains paranoid of staff. Stated in group, "I'm afraid everything is a lie." On her self inventory patient reports her goal to go home and writes, "how many eons? Just kidding. I still have slight paranoia but not anything disabling. What's an eon?" No SI/HI/AVH and patient remains safe. Lawrence Marseilles

## 2015-05-21 NOTE — Plan of Care (Signed)
Problem: Alteration in thought process Goal: STG-Patient is able to discuss thoughts with staff Outcome: Not Progressing Patient remains suspicious and distrusting of staff. Superficial and minimizing. Goal: STG-Patient does not respond to command hallucinations Outcome: Progressing Patient denying AVH and does not appear to respond to internal stimuli.

## 2015-05-21 NOTE — Progress Notes (Signed)
[facility] Group Notes:  (Nursing/MHT/Case Management/Adjunct)  Date:  05/21/2015  Time:  9:36 PM  Type of Therapy:  Psychoeducational Skills  Participation Level:  Active  Participation Quality:  Intrusive and Monopolizing  Affect:  Angry, Defensive and Labile  Cognitive:  Disorganized  Insight:  None  Engagement in Group:  Distracting, Monopolizing and Off Topic  Modes of Intervention:  Education  Summary of Progress/Problems: The patient began by speaking in a calm manner, but changed suddenly as she began to speak rapidly and was off topic. The patient initially verbalized that she had argued with some individuals earlier in the day. She then verbalized that she would like to be discharged from the hospital. After these topics were brought up, she stood up and mentioned that the staff had opened her bedroom door while she was undressing and that she had the right to "undress" or be "naked" in her room. As this Thereasa Parkin attempted to apologize to the patient she became louder and then dicussed the definition of the word, "insane". She felt that the word "insane" meant that the person was unable to change or make decisions. She also expressed that she needs someone to teach her how to communicate better. As a theme for the day, her coping skill is to learn how to teach people how to communicate.   Hazle Coca S 05/21/2015, 9:36 PM
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
## 2015-05-21 NOTE — [facility] Group Notes (Signed)
[facility] Group Notes:  (Clinical Social Work)  05/21/2015  11:15-12:00PM  Summary of Progress/Problems:   The main focus of today's process group was to discuss patients' fun and interesting things they do in their lives and how they can use that to help avoid future hospitalizations.  It was agreed in general by the group that it would be preferable to avoid future hospitalizations, and we discussed means of doing that.  As a follow-up, problems with adhering to medication recommendations were discussed.  The patient expressed herself frequently, was longwinded, irritable, and off topic numerous times during group.  She apologized at one point to another group member for doing something offensive to that person, and her apology was accepted.  She attempted to leave the room prior to becoming overly angry, but would return and start anew.  Type of Therapy:  Group Therapy - Process  Participation Level:  Active  Participation Quality:  Monopolizing  Affect:  Blunted, Irritable and Labile  Cognitive:  Alert  Insight:  Limited  Engagement in Therapy:  Improving  Modes of Intervention:  Exploration, Discussion  Ambrose Mantle, LCSW 05/21/2015, 1:14 PM

## 2015-05-21 NOTE — [facility] Group Notes (Signed)
[facility] Group Notes:  (Nursing/MHT/Case Management/Adjunct)  Date:  05/21/2015  Time:  0900  Type of Therapy:  Nurse Education  Participation Level:  Minimal  Participation Quality:  Attentive  Affect:  Anxious  Cognitive:  Alert  Insight:  Lacking  Engagement in Group:  Lacking  Modes of Intervention:  Discussion and Support  Summary of Progress/Problems: Therapeutic discussion and activity on how to manage healthy feelings. Patient came in at the end of group but did participate. Stated her biggest fear is that "everything is a lie."   Lawrence Marseilles 05/21/2015, 9:47 AM

## 2015-05-21 NOTE — Progress Notes (Signed)
Patient approached RN asking for prn zydis before lunch. Patient clearly agitated, angry but would not disclose what had happened in group. Per report, patient laughed at peer when she fell out of chair which patient later admitted to. Medicated with zydis and patient calmer on reassess. Thinking more linear though patient remains bizarre in her thought and paranoid. Denies AVH. Lawrence Marseilles

## 2015-05-22 NOTE — [facility] Group Notes (Signed)
[facility] Group Notes:  (Clinical Social Work)  05/22/2015  [facility] Group Notes:  (Clinical Social Work)  05/22/2015  11:00AM-12:00PM  Summary of Progress/Problems:  The main focus of today's process group was to listen to a variety of genres of music and to identify that different types of music provoke different responses.  The patient then was able to identify personally what was soothing for them, as well as energizing.    The patient expressed understanding of concepts, as well as knowledge of how each type of music affected her and how this can be used at home as a wellness/recovery tool.  She was in and out of the room, but not as much as yesterday.  She kept asking for songs to be turned off, then stating she should have listened longer before asking that.  She did not overreact on things that yesterday would probably have upset her.  Type of Therapy:  Music Therapy   Participation Level:  Active  Participation Quality:  Attentive and Inattentive, Monopolizing, Redirectable  Affect:  Blunted  Cognitive:  Disorganized  Insight:  Improving  Engagement in Therapy:  Improving  Modes of Intervention:   Activity, Exploration  Ambrose Mantle, LCSW 05/22/2015

## 2015-05-22 NOTE — Progress Notes (Signed)
Adult Psychoeducational Group Note  Date:  05/22/2015 Time:  9:51 PM  Group Topic/Focus:  Wrap-Up Group:   The focus of this group is to help patients review their daily goal of treatment and discuss progress on daily workbooks.  Participation Level:  Minimal  Participation Quality:  Appropriate  Affect:  Labile  Cognitive:  Lacking  Insight: Limited  Engagement in Group:  Limited  Modes of Intervention:  Socialization and Support  Additional Comments:  Patient attended and participated in group. She reports that her parent and sister came to visit with her today and it was good seeing them. She became emotional as she spoke about missing her family. The patient advised that she is still feeling confused.  Lita Mains [facility] 05/22/2015, 9:51 PM

## 2015-05-22 NOTE — Progress Notes (Signed)
  D: Pt was loud and yelling in the hall. Stated she feels "we're trying to control her mind". Pt was paranoid and discussing her treatment from previous visits. Writer found it difficult to follow the pt in conversation , but was able to diffuse the situation after several minutes of discussion.  A:  Support and encouragement was offered. 15 min checks continued for safety.  R: Pt remains safe.

## 2015-05-22 NOTE — [facility] Group Notes (Signed)
[facility] Group Notes:  (Nursing/MHT/Case Management/Adjunct)  Date:  05/22/2015  Time:  12:43 PM  Type of Therapy:  Psychoeducational Skills  Participation Level:  Active  Participation Quality:  Appropriate  Affect:  Appropriate  Cognitive:  Appropriate  Insight:  Appropriate  Engagement in Group:  Engaged  Modes of Intervention:  Problem-solving  Summary of Progress/Problems: Pt attended patient self inventory group.    Jacquelyne Balint Shanta 05/22/2015, 12:43 PM
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
## 2015-05-22 NOTE — Progress Notes (Signed)
Patient ID: Lisa [name], female   DOB: 10/02/1991, 24 y.o.   MRN: [id number]   D: Pt has been very paranoid on the unit today, pt has also had rapid mood swings. At times patient is on the unit crying, other times she is very angry accusing the doctors of drugging her. Pt has spent the day requesting copies of her record so that she can show people and have proof that the doctors went against her consent for treatment. Pt reported that she wanted to be discharged because she could not trust people any more. Pt also reported that she was not taking anymore medications, and has refused medication all day. Pt reported being negative SI/HI, no AH/VH noted. A: 15 min checks continued for patient safety. R: Pt safety maintained.

## 2015-05-23 MED ORDER — GABAPENTIN 100 MG PO CAPS
100.0000 mg | ORAL_CAPSULE | Freq: Two times a day (BID) | ORAL | Status: DC
  Administered 2015-05-23 – 2015-05-24 (×2): 100 mg via ORAL
  Filled 2015-05-23 (×4): qty 1

## 2015-05-23 MED ORDER — QUETIAPINE FUMARATE 25 MG PO TABS
75.0000 mg | ORAL_TABLET | Freq: Every day | ORAL | Status: DC
  Administered 2015-05-23: 75 mg via ORAL
  Filled 2015-05-23 (×2): qty 3

## 2015-05-23 MED ORDER — GABAPENTIN 100 MG PO CAPS
200.0000 mg | ORAL_CAPSULE | Freq: Every day | ORAL | Status: DC
  Administered 2015-05-23: 200 mg via ORAL
  Filled 2015-05-23 (×2): qty 2

## 2015-05-23 NOTE — Progress Notes (Signed)
The focus of this group is to help patients review their daily goal of treatment and discuss progress on daily workbooks. Pt did not attend the evening group. 

## 2015-05-23 NOTE — Progress Notes (Signed)
D:Pt's mood continues to be suspicious and labile. Pt goes from singing loudly to crying loudly. Pt was given prn medication and agitation/anxiety. A:Offered support and 15 minute checks. Gave encouragement and prn medication. R:Pt denies si and hi. Safety maintained on the unit.

## 2015-05-23 NOTE — Progress Notes (Signed)
Recreation Therapy Notes  06.20.16 @ 9:55 am Patient stated that her weekend was good.  Patient was more focused.  Patient stated she did some deep breathing and exercises and it worked well for her.  Patient stated she was going home tomorrow and was ready to go.  Patient will follow up in the afternoon with patient.  Caroll Rancher, LRT/CTRS     06.20.16 @1320  pm LRT followed up with patient about relaxation techniques.  Patient stated that she was crying earlier and that the atmospheric pressure was weighing on her, she was tired, etc.  LRT did some deep breathing with the patient to help her calm down.  Patient started to feel more relaxed after doing the deep breathing technique.  Caroll Rancher, LRT/CTRS   Caroll Rancher A 05/23/2015 4:27 PM
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
## 2015-05-23 NOTE — Tx Team (Signed)
Interdisciplinary Treatment Plan Update (Adult)  Date:  05/23/2015   Time Reviewed:  9:00 AM   Progress in Treatment: Attending groups: Yes. Participating in groups:  Yes. Taking medication as prescribed:  Yes. Tolerating medication:  Yes. Family/Significant othe contact made:  Yes Patient understands diagnosis:  Yes   Discussing patient identified problems/goals with staff:  Yes, see initial care plan. Medical problems stabilized or resolved:  Yes. Denies suicidal/homicidal ideation: Yes. Issues/concerns per patient self-inventory:  No. Other:  New problem(s) identified:  Discharge Plan or Barriers:  Return home, follow up outpt  Reason for Continuation of Hospitalization:   Comments:  Estimated length of stay:  Likely d/c tomorrow  New goal(s):  Review of initial/current patient goals per problem list:     Attendees: Patient:  05/23/2015 9:00 AM   Family:   05/23/2015 9:00 AM   Physician:  Jomarie Longs, MD 05/23/2015 9:00 AM   Nursing:   Waynetta Sandy, RN 05/23/2015 9:00 AM   CSW:    Daryel Gerald, LCSW   05/23/2015 9:00 AM   Other:  05/23/2015 9:00 AM   Other:   05/23/2015 9:00 AM   Other:  Onnie Boer, Nurse CM 05/23/2015 9:00 AM   Other:  Leisa Lenz, [facility] 05/23/2015 9:00 AM   Other:  Tomasita Morrow, P4CC  05/23/2015 9:00 AM   Other:  05/23/2015 9:00 AM   Other:  05/23/2015 9:00 AM   Other:  05/23/2015 9:00 AM   Other:  05/23/2015 9:00 AM   Other:  05/23/2015 9:00 AM   Other:   05/23/2015 9:00 AM    Scribe for Treatment Team:   Daryel Gerald B, 05/23/2015 9:00 AM

## 2015-05-23 NOTE — Progress Notes (Signed)
D: Lisa [name] continues to be labile and paranoid. Requesting to know all medications she has gotten since her admission and that she's no longer trusting anyone in this facility. Later, patient approached this writer and requested her bed time medicines. Patient accepted the medications and said "Thank you very much. This gonna help me to sleep. I don't know why I have not been getting this". Patient denies pain, SI,AH/VH at this time. No new complaint.  A: Support and encouragement offered. Due medications given as ordered. Every 15 minutes check for safety maintained. Will continue to monitor patient for safety and stabillity. R: Patient remains safe.

## 2015-05-23 NOTE — [facility] Group Notes (Signed)
[facility] LCSW Group Therapy  05/23/2015 1:15 pm  Type of Therapy: Process Group Therapy  Participation Level:  Active  Participation Quality:  Appropriate  Affect:  Flat  Cognitive:  Oriented  Insight:  Improving  Engagement in Group:  Limited  Engagement in Therapy:  Limited  Modes of Intervention:  Activity, Clarification, Education, Problem-solving and Support  Summary of Progress/Problems: Today's group addressed the issue of overcoming obstacles.  Patients were asked to identify their biggest obstacle post d/c that stands in the way of their on-going success, and then problem solve as to how to manage this.  Was engaged with others about LOS when I entered.  Stated that she has been here 11 days, but when last hospitalized was there for 21 days, and actually defended the need for long hospitalizations by listing how she benefits from increased duration.  At the same time, she was having a hard time focusing, and was out of her seat multiple times, as well as leaving the room a time or two.  Finally, after I had to dismiss another patient who kept coming in and out and turning the light on and off, she went to the door to hold it open, stating she wanted to make it easier for the other pt to enter and exit.  I asked her to not do that as it was distracting, but she refused to comply.  With the help of other staff, she exited and did not return.  Daryel Gerald B 05/23/2015   3:06 PM

## 2015-05-23 NOTE — [facility] Group Notes (Signed)
[facility] LCSW Aftercare Discharge Planning Group Note   05/23/2015 10:51 AM  Participation Quality:  Minimal  Mood/Affect:  Irritable  Depression Rating:  "What do you think?  Am I depressed?"  Anxiety Rating:  "What do you think?  Am I anxious?"  Thoughts of Suicide:  No Will you contract for safety?   NA  Current AVH:  Denies  Plan for Discharge/Comments:  "There's a big difference between traumatic and dramatic.  Are you sure you meant dramatic?"  Changed chairs multiple times during group.  Mood good, though was momentarily irritated while chiding for me for using words interchangeably.  Asked that I double check when her appointment is with Dr A.  Transportation Means:   Supports:  Daryel Gerald B

## 2015-05-24 MED ORDER — ARIPIPRAZOLE 5 MG PO TABS: 5.0000 mg | ORAL_TABLET | Freq: Every day | ORAL | Status: DC

## 2015-05-24 MED ORDER — LAMOTRIGINE 25 MG PO TABS: 50.0000 mg | ORAL_TABLET | Freq: Every day | ORAL | Status: DC

## 2015-05-24 MED ORDER — GABAPENTIN 100 MG PO CAPS: 200.0000 mg | ORAL_CAPSULE | Freq: Every day | ORAL | Status: DC

## 2015-05-24 MED ORDER — BENZTROPINE MESYLATE 1 MG PO TABS: 1.0000 mg | ORAL_TABLET | Freq: Every evening | ORAL | Status: DC

## 2015-05-24 MED ORDER — LORAZEPAM 0.5 MG PO TABS: 0.5000 mg | ORAL_TABLET | Freq: Four times a day (QID) | ORAL | Status: DC | PRN

## 2015-05-24 MED ORDER — GABAPENTIN 100 MG PO CAPS: 100.0000 mg | ORAL_CAPSULE | Freq: Two times a day (BID) | ORAL | Status: DC

## 2015-05-24 MED ORDER — QUETIAPINE FUMARATE 25 MG PO TABS: 75.0000 mg | ORAL_TABLET | Freq: Every day | ORAL | Status: DC

## 2015-05-24 MED ORDER — ARIPIPRAZOLE 20 MG PO TABS: 20.0000 mg | ORAL_TABLET | Freq: Every evening | ORAL | Status: DC

## 2015-05-24 NOTE — Progress Notes (Signed)
D: Patient is alert and oriented. Pt's mood and affect are anxious. Pt appears suspicious and paranoid. Pt appears restless and hyperactive at times, intrusive at times. Pt has flight of ideas at times. Pt denies SI/HI and AVH. Pt rates depression and hopelessness both 0/10, anxiety 4/10. Pt reports her goal for the day is "make it home away from this place." Pt reports she is ready to D/C from [facility] today. Pt states to RN this morning "Make sure you scan each of those medications, I know you know how to do your job but I have trust issues." Pt C/O anxiety this morning which decreased with PRN medication. Pt hypertensive this morning, HTN later resolved. Pt refused warm compress ordered. Pt is attending unit groups. A: Active listening by RN. Encouragement/Support provided to pt. Warm compress offered, per providers order. MD Eappen made aware of pt's HTN, will reasses prior to D/C. Medication education reviewed with pt. PRN medication administered for anxiety per providers orders (See MAR). Scheduled medications administered per providers orders (See MAR). 15 minute checks continued per protocol for patient safety.  R: Patient cooperative and receptive to nursing interventions. Pt remains safe. Pt easily redirectable.

## 2015-05-24 NOTE — Progress Notes (Signed)
  [facility] Adult Case Management Discharge Plan :  Will you be returning to the same living situation after discharge:  Yes,  home At discharge, do you have transportation home?: Yes,  family Do you have the ability to pay for your medications: Yes,  insurance  Release of information consent forms completed and in the chart;  Patient's signature needed at discharge.  Patient to Follow up at: Follow-up Information    [facility] with [facility] On 05/24/2015.   Why:  Tuesday at 1:30 with Dr A      Patient denies SI/HI: Yes,  yes    Safety Planning and Suicide Prevention discussed: Yes,  yes  Have you used any form of tobacco in the last 30 days? (Cigarettes, Smokeless Tobacco, Cigars, and/or Pipes): Yes  Has patient been referred to the Quitline?: Patient refused referral  Ida Rogue 05/24/2015, 1:32 PM

## 2015-05-24 NOTE — [facility] Suicide Risk Assessment (Signed)
[facility] Discharge Suicide Risk Assessment   Demographic Factors:  Caucasian  Total Time spent with patient: 30 minutes  Musculoskeletal: Strength & Muscle Tone: within normal limits Gait & Station: normal Patient leans: N/A  Psychiatric Specialty Exam: Physical Exam  Review of Systems  Psychiatric/Behavioral: The patient is nervous/anxious (improved).   All other systems reviewed and are negative.   Blood pressure 128/75, pulse 108, temperature 97.5 F (36.4 C), temperature source Oral, resp. rate 18, height 5\' 4"  (1.626 m), weight 61.689 kg (136 lb), last menstrual period 05/12/2015.Body mass index is 23.33 kg/(m^2).  General Appearance: Fairly Groomed  Patent attorney::  Fair  Speech:  Pressured409  Volume:  Normal  Mood:  Anxious  Affect:  Appropriate  Thought Process:  Goal Directed  Orientation:  Full (Time, Place, and Person)  Thought Content:  WDL  Suicidal Thoughts:  No  Homicidal Thoughts:  No  Memory:  Immediate;   Fair Recent;   Fair Remote;   Fair  Judgement:  Fair  Insight:  Fair  Psychomotor Activity:  Normal  Concentration:  Fair  Recall:  [employer] of Knowledge:Fair  Language: Fair  Akathisia:  No  Handed:  Right  AIMS (if indicated):     Assets:  Communication Skills Desire for Improvement  Sleep:  Number of Hours: 6.5  Cognition: WNL  ADL's:  Intact   Have you used any form of tobacco in the last 30 days? (Cigarettes, Smokeless Tobacco, Cigars, and/or Pipes): Yes  Has this patient used any form of tobacco in the last 30 days? (Cigarettes, Smokeless Tobacco, Cigars, and/or Pipes) Yes, A prescription for an FDA-approved tobacco cessation medication was offered at discharge and the patient refused  Mental Status Per Nursing Assessment::   On Admission:  NA  Current Mental Status by Physician: pt denies SI/HI/AH/VH  Loss Factors: Decrease in vocational status  Historical Factors: Impulsivity  Risk Reduction Factors:   Living with another  person, especially a relative and Positive social support  Continued Clinical Symptoms:  Previous Psychiatric Diagnoses and Treatments  Cognitive Features That Contribute To Risk:  Polarized thinking    Suicide Risk:  Minimal: No identifiable suicidal ideation.  Patients presenting with no risk factors but with morbid ruminations; may be classified as minimal risk based on the severity of the depressive symptoms  Principal Problem: Bipolar disorder, current episode manic severe with psychotic features ( ACUTE PHASE RESOLVED) Discharge Diagnoses:  Patient Active Problem List   Diagnosis Date Noted  . Hyperprolactinemia [E22.1] 05/17/2015  . Bipolar disorder, current episode manic severe with psychotic features [F31.2] 05/13/2015    Follow-up Information    Follow up [facility] On 05/24/2015.   Why:  Tuesday at 1:30 with Dr A      Plan Of Care/Follow-up recommendations:  Activity:  NO RESTRICTION Diet:  REGULAR Tests:  Follow up with Prolactin level in 3-6 months Other:  continue after care as scheduled  Is patient on multiple antipsychotic therapies at discharge:  No   Has Patient had three or more failed trials of antipsychotic monotherapy by history:  No  Recommended Plan for Multiple Antipsychotic Therapies: NA    Nadiya Pieratt MD 05/24/2015, 9:34 AM

## 2015-05-24 NOTE — [facility] Suicide Risk Assessment (Signed)
[facility] INPATIENT:  Family/Significant Other Suicide Prevention Education  Suicide Prevention Education:  Education Completed; No one has been identified by the patient as the family member/significant other with whom the patient will be residing, and identified as the person(s) who will aid the patient in the event of a mental health crisis (suicidal ideations/suicide attempt).  With written consent from the patient, the family member/significant other has been provided the following suicide prevention education, prior to the and/or following the discharge of the patient.  The suicide prevention education provided includes the following:  Suicide risk factors  Suicide prevention and interventions  National Suicide Hotline telephone number  [facility] assessment telephone number  [facility] Emergency Assistance 911  [facility] and/or Residential Mobile Crisis Unit telephone number  Request made of family/significant other to:  Remove weapons (e.g., guns, rifles, knives), all items previously/currently identified as safety concern.    Remove drugs/medications (over-the-counter, prescriptions, illicit drugs), all items previously/currently identified as a safety concern.  The family member/significant other verbalizes understanding of the suicide prevention education information provided.  The family member/significant other agrees to remove the items of safety concern listed above. The patient did not endorse SI at the time of admission, nor did the patient c/o SI during the stay here.  SPE not required. However, I did talk to father, Lisa [name], [phone], about crises planning.  Daryel Gerald B 05/24/2015, 1:29 PM

## 2015-05-24 NOTE — Progress Notes (Signed)
DISCHARGE NOTE: D: Patient was alert, oriented, in stable condition, and ambulatory with a steady gait upon discharge. Pt denies SI/HI and AVH. A: AVS reviewed and given to pt. Follow up reviewed with pt. Resources reviewed with pt, including [facility]. Prescriptions/Medications given to pt. Belongings returned to pt. Pt given time to ask questions and express concerns. Pt encouraged to follow up with PCP for any additional medical concerns. R: Pt D/C'd to "mom."   

## 2015-05-24 NOTE — Progress Notes (Signed)
D: Pt stated she informed her provider that  That she'd rather speak to her provider on the out side to get a 2nd opinion about her care. Pt was more subdued today than previous day.   A:  Support and encouragement was offered. 15 min checks continued for safety.  R: Pt remains safe.
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
## 2015-05-24 NOTE — [facility] Group Notes (Signed)
[facility] Group Notes:  (Nursing/MHT/Case Management/Adjunct)  Date:  05/24/2015  Time:  0915am  Type of Therapy:  Nurse Education  Participation Level:  Active  Participation Quality:  Attentive and Intrusive  Affect:  Anxious  Cognitive:  Alert  Insight:  Lacking  Engagement in Group:  Engaged  Modes of Intervention:  Discussion, Education and Support  Summary of Progress/Problems: Patient attended group, remained engaged and responded appropriately when prompted. Pt was intrusive at times. Pt was easily redirectable.  Laurine Blazer [place] A 05/24/2015, 10:14 AM

## 2015-06-08 ENCOUNTER — Encounter (HOSPITAL_COMMUNITY): Payer: Self-pay

## 2015-06-08 ENCOUNTER — Inpatient Hospital Stay (HOSPITAL_COMMUNITY)
Admission: RE | Admit: 2015-06-08 | Discharge: 2015-06-15 | DRG: 885 | Disposition: A | Discharge status: Home / Self Care | Payer: BLUE CROSS/BLUE SHIELD | Attending: Psychiatry | Admitting: Psychiatry

## 2015-06-08 DIAGNOSIS — Z62811 Personal history of psychological abuse in childhood: Secondary | ICD-10-CM | POA: Diagnosis present

## 2015-06-08 DIAGNOSIS — F259 Schizoaffective disorder, unspecified: Secondary | ICD-10-CM | POA: Diagnosis not present

## 2015-06-08 DIAGNOSIS — F22 Delusional disorders: Secondary | ICD-10-CM | POA: Diagnosis not present

## 2015-06-08 DIAGNOSIS — F312 Bipolar disorder, current episode manic severe with psychotic features: Principal | ICD-10-CM | POA: Diagnosis present

## 2015-06-08 DIAGNOSIS — G4700 Insomnia, unspecified: Secondary | ICD-10-CM | POA: Diagnosis present

## 2015-06-08 DIAGNOSIS — Z62810 Personal history of physical and sexual abuse in childhood: Secondary | ICD-10-CM | POA: Diagnosis present

## 2015-06-08 DIAGNOSIS — F410 Panic disorder [episodic paroxysmal anxiety] without agoraphobia: Secondary | ICD-10-CM | POA: Diagnosis present

## 2015-06-08 DIAGNOSIS — F29 Unspecified psychosis not due to a substance or known physiological condition: Secondary | ICD-10-CM | POA: Diagnosis not present

## 2015-06-08 DIAGNOSIS — Z87891 Personal history of nicotine dependence: Secondary | ICD-10-CM

## 2015-06-08 MED ORDER — TRAZODONE HCL 50 MG PO TABS: 50.0000 mg | ORAL_TABLET | Freq: Every evening | ORAL | Status: DC | PRN

## 2015-06-08 MED ORDER — ACETAMINOPHEN 325 MG PO TABS: 650.0000 mg | ORAL_TABLET | Freq: Four times a day (QID) | ORAL | Status: DC | PRN

## 2015-06-08 MED ORDER — QUETIAPINE FUMARATE 100 MG PO TABS
100.0000 mg | ORAL_TABLET | Freq: Every day | ORAL | Status: DC
  Filled 2015-06-08: qty 1

## 2015-06-08 MED ORDER — QUETIAPINE FUMARATE 100 MG PO TABS
100.0000 mg | ORAL_TABLET | Freq: Every day | ORAL | Status: DC
  Administered 2015-06-08 – 2015-06-14 (×7): 100 mg via ORAL
  Filled 2015-06-08 (×10): qty 1

## 2015-06-08 MED ORDER — MAGNESIUM HYDROXIDE 400 MG/5ML PO SUSP: 30.0000 mL | Freq: Every day | ORAL | Status: DC | PRN

## 2015-06-08 MED ORDER — HYDROXYZINE HCL 50 MG PO TABS
50.0000 mg | ORAL_TABLET | Freq: Four times a day (QID) | ORAL | Status: DC | PRN
  Administered 2015-06-08: 50 mg via ORAL
  Administered 2015-06-09 – 2015-06-11 (×2): 25 mg via ORAL
  Administered 2015-06-13: 50 mg via ORAL
  Filled 2015-06-08 (×2): qty 1

## 2015-06-08 MED ORDER — ALUM & MAG HYDROXIDE-SIMETH 200-200-20 MG/5ML PO SUSP: 30.0000 mL | ORAL | Status: DC | PRN

## 2015-06-08 MED ORDER — GABAPENTIN 300 MG PO CAPS
300.0000 mg | ORAL_CAPSULE | Freq: Three times a day (TID) | ORAL | Status: DC
  Filled 2015-06-08 (×2): qty 1

## 2015-06-08 MED ORDER — HYDROXYZINE HCL 50 MG PO TABS: 50.0000 mg | ORAL_TABLET | Freq: Three times a day (TID) | ORAL | Status: DC | PRN
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
## 2015-06-08 NOTE — Tx Team (Addendum)
Initial Interdisciplinary Treatment Plan   PATIENT STRESSORS: Marital or family conflict Occupational concerns   PATIENT STRENGTHS: Average or above average intelligence Communication skills General fund of knowledge Physical Health Supportive family/friends   PROBLEM LIST: Problem List/Patient Goals Date to be addressed Date deferred Reason deferred Estimated date of resolution  "I want come clarity - I thought someone was trying to hurt my family and me" - delusions/paranoia 06/08/2015      "I just wanted to leave my house" - anxiety 06/08/2015      "I want a copy of the form that Tim and I filled out last time I was here, about not trusting anesthesiologists" 06/08/2015                                           DISCHARGE CRITERIA:  Improved stabilization in mood, thinking, and/or behavior Motivation to continue treatment in a less acute level of care Verbal commitment to aftercare and medication compliance  PRELIMINARY DISCHARGE PLAN: Attend PHP/IOP Participate in family therapy Return to previous living arrangement  PATIENT/FAMIILY INVOLVEMENT: This treatment plan has been presented to and reviewed with the patient, Lisa [name] .The patient and family have been given the opportunity to ask questions and make suggestions.  Aurora Maskwyman, Charvis Lightner E 06/08/2015, 6:18 PM

## 2015-06-08 NOTE — Progress Notes (Addendum)
Patient ID: Lisa [name], female   DOB: [birth year], 24 y.o.   MRN: [id number]  Pt currently presents with a flat affect and anxious,labile behavior. Pt remains pleasant during admission assessment, smiling and cooperative. Pt states "I just wanted to get out of my house for a while, I was having this pain in my chest and abdomen." Pt also endorses "paranoid delusions" of "people wanting to hurt me and my family." Pt denies SI, HI and A/VH currently. Pt psych hx includes Bipolar, Anxiety and Depression. Pt endorsed hearing voices and seeing shadows in the past. Pt also states "I was here last time and I signed a form with Lisa [name], it was about 3rd party doctors and not wanting to have anesthesiologists work with me. I want to get a copy of it, if I can." Pt says she was taking her medications as scheduled but states "I don't think the Lithium is working for me." Pt also states "I do not want Ambien or Neurontin, I will not take them."   Consents signed, skin/belongings search completed and pt oriented to unit. Pt is stable at this time. Pt given the opportunity to express concerns and ask questions. Pt given toiletries.   Pt became verbally aggressive and her behavior became more erratic once on the unit. Pt states to Clinical research associatewriter, "Lisa [name], why didn't you get me a gown, huh, huh, huh, huh." Pt postures against staff member. Pt seen pacing the hallways, singing loudly, flopping out of her chair onto the floor and intruding on other pt's interactions with staff. Pt given redirection, no roommate order. Pt remains safe on the unit with 15 minute checks.  Will continue to monitor.

## 2015-06-08 NOTE — Progress Notes (Signed)
Adult Psychoeducational Group Note  Date:  06/08/2015 Time:  8:28 PM  Group Topic/Focus:  Wrap-Up Group:   The focus of this group is to help patients review their daily goal of treatment and discuss progress on daily workbooks.  Participation Level:  Active  Participation Quality:  Inattentive, Redirectable and Sharing  Affect:  Appropriate and Flat  Cognitive:  Lacking  Insight: Appropriate  Engagement in Group:  Lacking and Off Topic  Modes of Intervention:  Discussion  Additional Comments:  Patient rated her day a 7. She stated she got to hang out with her mother and read a really good book.  Lisa [name] 06/08/2015, 8:28 PM

## 2015-06-09 ENCOUNTER — Encounter (HOSPITAL_COMMUNITY): Payer: Self-pay | Admitting: Psychiatry

## 2015-06-09 DIAGNOSIS — F259 Schizoaffective disorder, unspecified: Secondary | ICD-10-CM

## 2015-06-09 LAB — HEPATIC FUNCTION PANEL
ALT: 16 U/L (ref 14–54)
AST: 23 U/L (ref 15–41)
Albumin: 4.7 g/dL (ref 3.5–5.0)
Alkaline Phosphatase: 67 U/L (ref 38–126)
Bilirubin, Direct: <0.1 mg/dL — ABNORMAL LOW (ref 0.1–0.5)
Total Bilirubin: 0.5 mg/dL (ref 0.3–1.2)
Total Protein: 7.8 g/dL (ref 6.5–8.1)

## 2015-06-09 LAB — CBC
HCT: 43.2 % (ref 36.0–46.0)
Hemoglobin: 14.6 g/dL (ref 12.0–15.0)
MCH: 30.7 pg (ref 26.0–34.0)
MCHC: 33.8 g/dL (ref 30.0–36.0)
MCV: 90.9 fL (ref 78.0–100.0)
Platelets: 309 10*3/uL (ref 150–400)
RBC: 4.75 MIL/uL (ref 3.87–5.11)
RDW: 12.5 % (ref 11.5–15.5)
WBC: 11.2 10*3/uL — ABNORMAL HIGH (ref 4.0–10.5)

## 2015-06-09 LAB — BASIC METABOLIC PANEL
Anion gap: 9 (ref 5–15)
BUN: 11 mg/dL (ref 6–20)
CO2: 26 mmol/L (ref 22–32)
Calcium: 9.4 mg/dL (ref 8.9–10.3)
Chloride: 105 mmol/L (ref 101–111)
Creatinine, Ser: 0.81 mg/dL (ref 0.44–1.00)
GFR calc Af Amer: >60 mL/min (ref >60)
GFR calc non Af Amer: >60 mL/min (ref >60)
Glucose, Bld: 88 mg/dL (ref 65–99)
Potassium: 4.3 mmol/L (ref 3.5–5.1)
Sodium: 140 mmol/L (ref 135–145)

## 2015-06-09 MED ORDER — DIPHENHYDRAMINE HCL 50 MG PO CAPS
50.0000 mg | ORAL_CAPSULE | Freq: Once | ORAL | Status: AC
  Administered 2015-06-09: 50 mg via ORAL
  Filled 2015-06-09: qty 2
  Filled 2015-06-09: qty 1

## 2015-06-09 MED ORDER — LORAZEPAM 1 MG PO TABS
1.0000 mg | ORAL_TABLET | Freq: Four times a day (QID) | ORAL | Status: DC | PRN
  Administered 2015-06-10 – 2015-06-13 (×3): 1 mg via ORAL
  Filled 2015-06-09 (×3): qty 1

## 2015-06-09 MED ORDER — OLANZAPINE 5 MG PO TBDP
5.0000 mg | ORAL_TABLET | Freq: Four times a day (QID) | ORAL | Status: DC | PRN
  Administered 2015-06-09: 5 mg via ORAL
  Filled 2015-06-09: qty 1

## 2015-06-09 MED ORDER — ARIPIPRAZOLE 5 MG PO TABS
5.0000 mg | ORAL_TABLET | Freq: Two times a day (BID) | ORAL | Status: DC
  Administered 2015-06-09 – 2015-06-12 (×6): 5 mg via ORAL
  Filled 2015-06-09 (×12): qty 1

## 2015-06-09 MED ORDER — HYDROXYZINE HCL 25 MG PO TABS
ORAL_TABLET | ORAL | Status: AC
  Filled 2015-06-09: qty 2

## 2015-06-09 NOTE — Progress Notes (Signed)
Patient ID: [name], female   DOB: 14-Jan-1991, 24 y.o.   MRN: [id number]  DAR: Pt. Denies SI/HI and A/V Hallucinations to this Clinical research associatewriter. Patient reports her sleep last night was good, appetite is fair, energy level is normal, and concentration level is good. Patient reports depression, anxiety, and hopelessness at 3/10 for the day. Patient does not report any pain or discomfort initially however reported some discomfort in her right calf. Writer provided patient with a heat pack which patient reported was effective. Support and encouragement provided to the patient. Patient continues to refuse labs and to give a urine sample although Clinical research associatewriter encouraged. Patient is disorganized, labile, and continues to have flight of ideas. Patient is seen in the hallway stating different things such as, "we hope for mayo" and, "you don't know me I could be part native [place]american or lebanese!" Patient changes her clothes a few times throughout the day and sings intermittently. Patient can be seen crying or verbally aggressive one moment and pleasant the next. Q15 minute checks are maintained for safety.

## 2015-06-09 NOTE — [facility] Counselor (Signed)
Adult Psychosocial Assessment Update Interdisciplinary Team  [facility]  Admissions Discharges  Date:12/23/13 Date:  Date: 05/12/15 Date: 6/21  Date: Date:  Date: Date:  Date: Date:   Changes since the last Psychosocial Assessment (including adherence to outpatient mental health and/or substance abuse treatment, situational issues contributing to decompensation and/or relapse). Cammie McgeeCorinna was discharged 2 weeks ago, and insists that she has been medication compliant and went to her follow up appointment.  She drove herself here yesterday, and presents with delusions of persecution and disorganization, along with mood lability.  According to parents, she was unable to return to work due to problems with disorganization, and she struggled especially later in the afternoon with rants and conspiracy theories.                Discharge Plan 1. Will you be returning to the same living situation after discharge?   Yes:X  home No:      If no, what is your plan?           2. Would you like a referral for services when you are discharged? Yes:     If yes, for what services?  No:       Will follow up with [facility]       Summary and Recommendations (to be completed by the evaluator) Cheri GuppyCorina is a 24 YO Caucasian female who presents with same symptoms she did when admitted in June, and, according to family, returned home in not much better shape than when she left.  They feel that the right medication combination and dosage has been found since the [place] [facility], which worked effectively in the past.  She was tried on it durng her last stay, but had an EPS reaction. Besides finding the right medication combination, she can benefit from crises stabilization, therapeutic milieu and referral for services.                       Signature:  Ida Rogueorth, Giomar Gusler B, 06/09/2015 5:14 PM

## 2015-06-09 NOTE — Plan of Care (Signed)
Problem: Ineffective individual coping Goal: STG: Patient will remain free from self harm Outcome: Progressing Patient remains free from self harm.      

## 2015-06-09 NOTE — Progress Notes (Signed)
D. Pt had been up for much of the evening, did attend and participate in evening group activity. Pt has been somatically focused this evening, as well as endorsing feelings of paranoia. Pt has slept very little up to this point and has been out pacing halls a good portion of the morning. Pt has received medications without incident this evening. A. Support and encouragement provided. R. Safety maintained, will continue to monitor.

## 2015-06-09 NOTE — [facility] Group Notes (Signed)
[facility] Group Notes:  (Counselor/Nursing/MHT/Case Management/Adjunct)  06/09/2015 1:15PM  Type of Therapy:  Group Therapy  Participation Level:  Active  Participation Quality:  Appropriate  Affect:  Flat  Cognitive:  Oriented  Insight:  Improving  Engagement in Group:  Limited  Engagement in Therapy:  Limited  Modes of Intervention:  Discussion, Exploration and Socialization  Summary of Progress/Problems: The topic for group was balance in life.  Pt participated in the discussion about when their life was in balance and out of balance and how this feels.  Pt discussed ways to get back in balance and short term goals they can work on to get where they want to be. Was in and out of group multiple times.  Unable to sit still.  Changed outfits as well.  Difficulty tracking. Bizarre interactions.   Daryel Geraldorth, Vylette Strubel B 06/09/2015 3:00 PM

## 2015-06-09 NOTE — [facility] Suicide Risk Assessment (Signed)
[facility]   Nursing information obtained from:    Demographic factors:    Current Mental Status:    Loss Factors:    Historical Factors:    Risk Reduction Factors:    Total Time spent with patient: 45 minutes Principal Problem: <principal problem not specified> Diagnosis:   Patient Active Problem List   Diagnosis Date Noted  . Paranoia [F22] 06/08/2015  . Hyperprolactinemia [E22.1] 05/17/2015  . Bipolar disorder, current episode manic severe with psychotic features [F31.2] 05/13/2015     Continued Clinical Symptoms:  Alcohol Use Disorder Identification Test Final Score (AUDIT): 1 The "Alcohol Use Disorders Identification Test", Guidelines for Use in Primary Care, Second Edition.  World Science writerHealth Organization [facility](WHO). Score between 0-7:  no or low risk or alcohol related problems. Score between 8-15:  moderate risk of alcohol related problems. Score between 16-19:  high risk of alcohol related problems. Score 20 or above:  warrants further diagnostic evaluation for alcohol dependence and treatment.   CLINICAL FACTORS:   Bipolar Disorder:   Mixed State Currently Psychotic  Psychiatric Specialty Exam: Physical Exam  ROS  Blood pressure 133/79, pulse 108, temperature 97.7 F (36.5 C), temperature source Oral, resp. rate 16, height 5\' 4"  (1.626 m), weight 63.504 kg (140 lb), last menstrual period 05/12/2015, SpO2 100 %.Body mass index is 24.02 kg/(m^2).    COGNITIVE FEATURES THAT CONTRIBUTE TO RISK:  Closed-mindedness, Loss of executive function, Polarized thinking and Thought constriction (tunnel vision)    SUICIDE RISK:   Mild:  Suicidal ideation of limited frequency, intensity, duration, and specificity.  There are no identifiable plans, no associated intent, mild dysphoria and related symptoms, good self-control (both objective and subjective assessment), few other risk factors, and identifiable protective factors, including available and accessible  social support.  PLAN OF CARE: Supportive approach/coping skills                                Mood instability; will reassess her medications                               Delusions; as above                               Work to improve her reality testing  Medical Decision Making:  Review of Psycho-Social Stressors (1), Review or order clinical lab tests (1), Review of Medication Regimen & Side Effects (2) and Review of New Medication or Change in Dosage (2)  I certify that inpatient services furnished can reasonably be expected to improve the patient's condition.   Quantisha Marsicano A 06/09/2015, 7:39 PM

## 2015-06-09 NOTE — Progress Notes (Signed)
Patient ID: Arman FilterCorina Hoerner, female   DOB: Oct 25, 1991, 24 y.o.   MRN: [id number]  Writer asked patient initially to give urine sample. Patient said, "okay I just need to drink more water." Writer went to retrieve water from med room and returned to give patient. Patient then said, "I already got my vitals done." Patient became verbally aggressive and argumentative. Patient refused to give urine sample and gave specimen cup back to Clinical research associatewriter.
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
## 2015-06-09 NOTE — Progress Notes (Signed)
D   Pt is calm and cooperative    She did not attend group but does interact appropriately with select peers    She requested to take half of her dose of vistaril   Pt has labile mood and continues to have intrusive thoughts regarding her families safety    A    Verbal support given   Medications administered and effectiveness monitored   Q 15 min checks R    Pt safe at present
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
## 2015-06-10 DIAGNOSIS — F29 Unspecified psychosis not due to a substance or known physiological condition: Secondary | ICD-10-CM

## 2015-06-10 NOTE — [facility] Group Notes (Signed)
[facility] LCSW Group Therapy  06/10/2015 1:30 PM  Type of Therapy:  Group Therapy  Participation Level:  Minimal  Participation Quality:  Left halfway through without speaking  Modes of Intervention:  Discussion, Exploration, Socialization and Support  Summary of Progress/Problems:  Chaplain led group explored concept of hope and its relevance to mental health recovery.  Patients explored themes including what matters to them personally, how others responses are similar/different, and what they are hopeful for.  Group members discussed relevance of social supports, innter strength and using their own stories to craft a recovery path.  Patient did not participate in meaningful way.  Santa Generaunningham, Anne C 5:24 PM

## 2015-06-10 NOTE — [facility] Group Notes (Signed)
[facility]   06/10/2015 10:35 AM  Participation Quality:  Engaged  Mood/Affect:  Appropriate  Depression Rating:  denies  Anxiety Rating:  denies  Thoughts of Suicide:  No Will you contract for safety?   NA  Current AVH:  Denies  Plan for Discharge/Comments:  Excitedly told me about the plan that she, her parents and Dr Dub MikesLugo had come up with last night.  It involves [facility] and making sure she does not have any side effects.  Then she will go back to Dr A, but at the same time her parents will [facility] [place] to see if he has any openings to see her so she can get a "third opinion."  Transportation Means:   Supports:  [place], MadridRodney B

## 2015-06-10 NOTE — Progress Notes (Signed)
[facility] MD Progress Note  06/10/2015 8:30 PM Lisa [name]  MRN:  [id number] Subjective:  Lisa [name] states that she is feeling somewhat sedated tired. She is still trying to understand what is going on with her. She is not having the agitation that he was having yesterday. She states she wants to feel better be able to go home Principal Problem: <principal problem not specified> Diagnosis:   Patient Active Problem List   Diagnosis Date Noted  . Paranoia [F22] 06/08/2015  . Hyperprolactinemia [E22.1] 05/17/2015  . Bipolar disorder, current episode manic severe with psychotic features [F31.2] 05/13/2015   Total Time spent with patient: 30 minutes   Past Medical History:  Past Medical History  Diagnosis Date  . Anxiety   . Depression   . Bipolar disorder     Past Surgical History  Procedure Laterality Date  . Extraction of wisdom teeth     Family History:  Family History  Problem Relation Age of Onset  . Schizophrenia Mother   . Depression Father   . Seizures Sister    Social History:  History  Alcohol Use No     History  Drug Use No    History   Social History  . Marital Status: Single    Spouse Name: N/A  . Number of Children: N/A  . Years of Education: N/A   Social History Main Topics  . Smoking status: Former Smoker -- 0.00 packs/day  . Smokeless tobacco: Not on file  . Alcohol Use: No  . Drug Use: No  . Sexual Activity: Yes     Comment: Plan B Pill   Other Topics Concern  . None   Social History Narrative   Additional History:    Sleep: Fair  Appetite:  Fair   Assessment:   Musculoskeletal: Strength & Muscle Tone: within normal limits Gait & Station: normal Patient leans: normal   Psychiatric Specialty Exam: Physical Exam  Review of Systems  Constitutional: Negative.   HENT: Negative.   Eyes: Negative.   Respiratory: Negative.   Cardiovascular: Negative.   Gastrointestinal: Negative.   Genitourinary: Negative.   Musculoskeletal:  Negative.   Skin: Negative.   Neurological: Negative.   Endo/Heme/Allergies: Negative.   Psychiatric/Behavioral: The patient is nervous/anxious.     Blood pressure 114/73, pulse 120, temperature 97.4 F (36.3 C), temperature source Oral, resp. rate 18, height 5' 4" (1.626 m), weight 63.504 kg (140 lb), last menstrual period 05/12/2015, SpO2 100 %.Body mass index is 24.02 kg/(m^2).  General Appearance: Fairly Groomed  Engineer, water::  Fair  Speech:  Clear and Coherent  Volume:  fluctuates  Mood:  Anxious and worried  Affect:  anxious worried  Thought Process:  Coherent and Goal Directed  Orientation:  Full (Time, Place, and Person)  Thought Content:  symptoms events worries concerns  Suicidal Thoughts:  No  Homicidal Thoughts:  No  Memory:  Immediate;   Fair Recent;   Fair Remote;   Fair  Judgement:  Fair  Insight:  Present and Shallow  Psychomotor Activity:  Normal  Concentration:  Poor (need to repeat)  Recall:  [employer] of Knowledge:Fair  Language: Fair  Akathisia:  No  Handed:  Left  AIMS (if indicated):     Assets:  Desire for Improvement Social Support  ADL's:  Intact  Cognition: WNL  Sleep:  Number of Hours: 6     Current Medications: Current Facility-Administered Medications  Medication Dose Route Frequency Provider Last Rate Last Dose  . acetaminophen (TYLENOL) tablet  650 mg  650 mg Oral Q6H PRN Kerrie Buffalo, NP      . alum & mag hydroxide-simeth (MAALOX/MYLANTA) 200-200-20 MG/5ML suspension 30 mL  30 mL Oral Q4H PRN Kerrie Buffalo, NP      . ARIPiprazole (ABILIFY) tablet 5 mg  5 mg Oral BID Nicholaus Bloom, MD   5 mg at 06/10/15 1642  . hydrOXYzine (ATARAX/VISTARIL) tablet 50 mg  50 mg Oral Q6H PRN Laverle Hobby, PA-C   25 mg at 06/09/15 2109  . LORazepam (ATIVAN) tablet 1 mg  1 mg Oral Q6H PRN Nicholaus Bloom, MD   1 mg at 06/10/15 0746  . magnesium hydroxide (MILK OF MAGNESIA) suspension 30 mL  30 mL Oral Daily PRN Kerrie Buffalo, NP      . QUEtiapine  (SEROQUEL) tablet 100 mg  100 mg Oral QHS Nicholaus Bloom, MD   100 mg at 06/09/15 2109    Lab Results:  Results for orders placed or performed during the hospital encounter of 06/08/15 (from the past 48 hour(s))  Basic metabolic panel     Status: None   Collection Time: 06/09/15  7:25 PM  Result Value Ref Range   Sodium 140 135 - 145 mmol/L   Potassium 4.3 3.5 - 5.1 mmol/L   Chloride 105 101 - 111 mmol/L   CO2 26 22 - 32 mmol/L   Glucose, Bld 88 65 - 99 mg/dL   BUN 11 6 - 20 mg/dL   Creatinine, Ser 0.81 0.44 - 1.00 mg/dL   Calcium 9.4 8.9 - 10.3 mg/dL   GFR calc non Af Amer >60 >60 mL/min   GFR calc Af Amer >60 >60 mL/min    Comment: (NOTE) The eGFR has been calculated using the CKD EPI equation. This calculation has not been validated in all clinical situations. eGFR's persistently <60 mL/min signify possible Chronic Kidney Disease.    Anion gap 9 5 - 15    Comment: Performed at [facility]  CBC     Status: Abnormal   Collection Time: 06/09/15  7:25 PM  Result Value Ref Range   WBC 11.2 (H) 4.0 - 10.5 K/uL   RBC 4.75 3.87 - 5.11 MIL/uL   Hemoglobin 14.6 12.0 - 15.0 g/dL   HCT 43.2 36.0 - 46.0 %   MCV 90.9 78.0 - 100.0 fL   MCH 30.7 26.0 - 34.0 pg   MCHC 33.8 30.0 - 36.0 g/dL   RDW 12.5 11.5 - 15.5 %   Platelets 309 150 - 400 K/uL    Comment: Performed at [facility]  Hepatic function panel     Status: Abnormal   Collection Time: 06/09/15  7:25 PM  Result Value Ref Range   Total Protein 7.8 6.5 - 8.1 g/dL   Albumin 4.7 3.5 - 5.0 g/dL   AST 23 15 - 41 U/L   ALT 16 14 - 54 U/L   Alkaline Phosphatase 67 38 - 126 U/L   Total Bilirubin 0.5 0.3 - 1.2 mg/dL   Bilirubin, Direct <0.1 (L) 0.1 - 0.5 mg/dL   Indirect Bilirubin NOT CALCULATED 0.3 - 0.9 mg/dL    Comment: Performed at [facility]    Physical Findings: AIMS:  , ,  ,  ,    CIWA:    COWS:     Treatment Plan Summary: Daily contact with patient to assess  and evaluate symptoms and progress in treatment and Medication management Supportive approach/coping skills Psychosis; will  continue to reassess for the most effective medication regime. She historically did better on Invega yet there is a notation in the allergies that Invega caused anaphylaxis such did the Artane. She seemed to have had an EPS but not anaphylaxis so that needs to be clarified. The Prolactin level was elevated last time she was here, she was coming off the Invega. Invega could have been the culprit. Will repeat the Prolactin level. Meanwhile will continue the present regime; Abilify 5 mg BID with plans to decrease and D/C if we go the Invega route.  Will also continue the Seroquel 100 mg HS. Will also revisit the idea of a mood stabilizer Medical Decision Making:  Review of Psycho-Social Stressors (1), Review or order clinical lab tests (1), Review of Medication Regimen & Side Effects (2) and Review of New Medication or Change in Dosage (2)     , A 06/10/2015, 8:30 PM  

## 2015-06-10 NOTE — Progress Notes (Signed)
[facility] Group Notes:  (Nursing/MHT/Case Management/Adjunct)  Date:  06/10/2015  Time:  8:47 PM  Type of Therapy:  Psychoeducational Skills  Participation Level:  Active  Participation Quality:  Attentive  Affect:  Appropriate  Cognitive:  Appropriate  Insight:  Limited  Engagement in Group:  Engaged  Modes of Intervention:  Education  Summary of Progress/Problems: The patient indicated in group that she felt both "restless and tired" today. The patient inquired about her dirty laundry but otherwise had nothing else to share about her day. The patient walked up to the board in the dayroom and pointed to the dry erase board but did not share what her thoughts were.  As a theme for the day, her support system will consist of her parents.   Hazle CocaGOODMAN, Charma Mocarski S 06/10/2015, 8:47 PM

## 2015-06-10 NOTE — Progress Notes (Signed)
D   Pt is calm and cooperative    She did not attend group but does interact appropriately with select peers    She requested to take half of her dose of vistaril   Pt has labile mood and continues to have intrusive thoughts regarding her families safety    She is somatically focused and was worried about a small bruise on her leg saying i dont want to be a hemophiliac  A    Verbal support given   Medications administered and effectiveness monitored   Q 15 min checks   Provided pt with cold pack for her bruise and encouraged her to talk with her doctor about her concerns  R    Pt safe at present

## 2015-06-10 NOTE — Plan of Care (Signed)
Problem: Alteration in thought process Goal: STG-Patient is able to follow short directions Outcome: Progressing Patient is able to follow short directions with prompting and positive reinforcement.

## 2015-06-10 NOTE — Progress Notes (Signed)
Patient ID: [name], female   DOB: 01-Aug-1991, 24 y.o.   MRN: [id number]  DAR: Pt. Denies SI/HI and A/V Hallucinations. She reports that she slept well last night, her appetite is fair, energy level is low, and concentration level is good. She rates her depression 2/10, hopelessness 3/10, and her anxiety 4/10 for the day. Patient does not report any pain or discomfort at this time. Support and encouragement provided to the patient. Scheduled medications administered to patient. Patient reported some anxiety this morning and received PRN Ativan. Patient has appeared less agitated this morning than yesterday. Patient paced a little while up and down then hall then retreated to her room where she has been sleeping since then. Patient is cooperative with taking her morning medications. Q15 minute checks are maintained for safety.

## 2015-06-10 NOTE — [facility] Suicide Risk Assessment (Signed)
[facility] INPATIENT:  Family/Significant Other Suicide Prevention Education  Suicide Prevention Education:  Education Completed; No one has been identified by the patient as the family member/significant other with whom the patient will be residing, and identified as the person(s) who will aid the patient in the event of a mental health crisis (suicidal ideations/suicide attempt).  With written consent from the patient, the family member/significant other has been provided the following suicide prevention education, prior to the and/or following the discharge of the patient.  The suicide prevention education provided includes the following:  Suicide risk factors  Suicide prevention and interventions  National Suicide Hotline telephone number  [facility] assessment telephone number  [facility] Assistance 911  [facility] and/or [facility]idential Mobile [facility] telephone number  Request made of family/significant other to:  Remove weapons (e.g., guns, rifles, knives), all items previously/currently identified as safety concern.    Remove drugs/medications (over-the-counter, prescriptions, illicit drugs), all items previously/currently identified as a safety concern.  The family member/significant other verbalizes understanding of the suicide prevention education information provided.  The family member/significant other agrees to remove the items of safety concern listed above. The patient did not endorse SI at the time of admission, nor did the patient c/o SI during the stay here.  SPE not required.   Daryel Geraldorth, Truc Winfree B 06/10/2015, 12:15 PM

## 2015-06-11 LAB — PREGNANCY, URINE: Preg Test, Ur: NEGATIVE

## 2015-06-11 MED ORDER — HYDROXYZINE HCL 25 MG PO TABS
ORAL_TABLET | ORAL | Status: AC
Start: 2015-06-11 — End: 2015-06-12
  Filled 2015-06-11: qty 2

## 2015-06-11 NOTE — Progress Notes (Signed)
Patient ID: [name], female   DOB: Feb 07, 1991, 24 y.o.   MRN: [id number]   D: Pt remains very paranoid and delusional on the unit. She is also very labile, at times she is happy and other times she is very upset. Pt has rapid mood swings, and was given Ativan when she got upset and reported lots of anxiety. Pt has taken all medications as prescribed by the doctor, no issues or concerns noted. Pt reported that her depression was a 0, her hopelessness was a 0, and her anxiety was a 1. Pt reported that her goal for today was to get ready for discharge. Pt reported being negative SI/HI, no AH/VH noted. A: 15 min checks continued for patient safety. R: Pt safety maintained.

## 2015-06-11 NOTE — Progress Notes (Signed)
D   Pt is calm and cooperative    She did not attend group but does interact appropriately with select peers    She requested to take half of her dose of vistaril   Pt has labile mood and continues to have intrusive thoughts regarding her families safety    She is somatically focused and was complains of anxiety A    Verbal support given   Medications administered and effectiveness monitored   Q 15 min checks    pt  encouraged her to talk with her doctor about her concerns  R    Pt safe at present

## 2015-06-11 NOTE — [facility] Group Notes (Signed)
[facility] Group Notes:  (Nursing/MHT/Case Management/Adjunct)  Date:  06/11/2015  Time:  1:27 PM  Type of Therapy:  Psychoeducational Skills  Participation Level:  Active  Participation Quality:  Appropriate  Affect:  Appropriate  Cognitive:  Appropriate  Insight:  Appropriate  Engagement in Group:  Engaged  Modes of Intervention:  Discussion  Summary of Progress/Problems: Pt did attend self inventory group, pt reported that she was negative SI/HI, no AH/VH noted. Pt rated her depression as a 0, and her helplessness/hopelessness as a 0.     Pt reported no issues or concerns, and that her coping skills were to color, sing, and read.    Jacquelyne BalintForrest, Chidinma Clites Shanta 06/11/2015, 1:27 PM

## 2015-06-11 NOTE — Progress Notes (Signed)
Patient ID: Lisa [name], female   DOB: [birth year], 24 y.o.   MRN: [id number] [facility] MD Progress Note  06/11/2015 12:24 PM Lisa [name]  MRN:  [id number] Subjective:  Lisa [name] states that she is feeling better about her delusions.  Initially has presented with reports that, "she is being genetically modified and her father is a slave. She reported that she has brought something back from [place] where she visited. Has been non compliant with meds in past. She has been started on abilify and seroquel as in past her prolactin level was high.  Prolactin level has been sent to lab. Still feels to be elated.  She was later on found singing in her room. Denies hopelessness.  Principal Problem: <principal problem not specified> Diagnosis:   Patient Active Problem List   Diagnosis Date Noted  . Paranoia [F22] 06/08/2015  . Hyperprolactinemia [E22.1] 05/17/2015  . Bipolar disorder, current episode manic severe with psychotic features [F31.2] 05/13/2015   Total Time spent with patient: 30 minutes   Past Medical History:  Past Medical History  Diagnosis Date  . Anxiety   . Depression   . Bipolar disorder     Past Surgical History  Procedure Laterality Date  . Extraction of wisdom teeth     Family History:  Family History  Problem Relation Age of Onset  . Schizophrenia Mother   . Depression Father   . Seizures Sister    Social History:  History  Alcohol Use No     History  Drug Use No    History   Social History  . Marital Status: Single    Spouse Name: N/A  . Number of Children: N/A  . Years of Education: N/A   Social History Main Topics  . Smoking status: Former Smoker -- 0.00 packs/day  . Smokeless tobacco: Not on file  . Alcohol Use: No  . Drug Use: No  . Sexual Activity: Yes     Comment: Plan B Pill   Other Topics Concern  . None   Social History Narrative   Additional History:    Sleep: Fair  Appetite:  Fair   Assessment:    Musculoskeletal: Strength & Muscle Tone: within normal limits Gait & Station: normal Patient leans: normal   Psychiatric Specialty Exam: Physical Exam  Review of Systems  Constitutional: Negative for fever.  Cardiovascular: Negative.   Neurological: Negative.  Negative for tremors and headaches.  Endo/Heme/Allergies: Negative.   Psychiatric/Behavioral: The patient is nervous/anxious.     Blood pressure 124/77, pulse 115, temperature 97.6 F (36.4 C), temperature source Oral, resp. rate 20, height $RemoveBe'5\' 4"'cntJLBCsA$  (1.626 m), weight 63.504 kg (140 lb), last menstrual period 05/12/2015, SpO2 100 %.Body mass index is 24.02 kg/(m^2).  General Appearance: Fairly Groomed  Engineer, water::  Fair  Speech:  Clear and Coherent  Volume:  fluctuates  Mood:  Somewhat elevated  Affect:  congruent  Thought Process:  Coherent and Goal Directed  Orientation:  Full (Time, Place, and Person)  Thought Content:  symptoms events worries concerns  Suicidal Thoughts:  No  Homicidal Thoughts:  No  Memory:  Immediate;   Fair Recent;   Fair Remote;   Fair  Judgement:  Fair  Insight:  Present and Shallow  Psychomotor Activity:  Normal  Concentration:  Poor (need to repeat)  Recall:  [place]  Language: Fair  Akathisia:  No  Handed:  Left  AIMS (if indicated):     Assets:  Desire for Improvement Social Support  ADL's:  Intact  Cognition: WNL  Sleep:  Number of Hours: 5.25     Current Medications: Current Facility-Administered Medications  Medication Dose Route Frequency Provider Last Rate Last Dose  . acetaminophen (TYLENOL) tablet 650 mg  650 mg Oral Q6H PRN Kerrie Buffalo, NP      . alum & mag hydroxide-simeth (MAALOX/MYLANTA) 200-200-20 MG/5ML suspension 30 mL  30 mL Oral Q4H PRN Kerrie Buffalo, NP      . ARIPiprazole (ABILIFY) tablet 5 mg  5 mg Oral BID Nicholaus Bloom, MD   5 mg at 06/11/15 0811  . hydrOXYzine (ATARAX/VISTARIL) tablet 50 mg  50 mg Oral Q6H PRN Laverle Hobby,  PA-C   25 mg at 06/09/15 2109  . LORazepam (ATIVAN) tablet 1 mg  1 mg Oral Q6H PRN Nicholaus Bloom, MD   1 mg at 06/11/15 1210  . magnesium hydroxide (MILK OF MAGNESIA) suspension 30 mL  30 mL Oral Daily PRN Kerrie Buffalo, NP      . QUEtiapine (SEROQUEL) tablet 100 mg  100 mg Oral QHS Nicholaus Bloom, MD   100 mg at 06/10/15 2105    Lab Results:  Results for orders placed or performed during the hospital encounter of 06/08/15 (from the past 48 hour(s))  Basic metabolic panel     Status: None   Collection Time: 06/09/15  7:25 PM  Result Value Ref Range   Sodium 140 135 - 145 mmol/L   Potassium 4.3 3.5 - 5.1 mmol/L   Chloride 105 101 - 111 mmol/L   CO2 26 22 - 32 mmol/L   Glucose, Bld 88 65 - 99 mg/dL   BUN 11 6 - 20 mg/dL   Creatinine, Ser 0.81 0.44 - 1.00 mg/dL   Calcium 9.4 8.9 - 10.3 mg/dL   GFR calc non Af Amer >60 >60 mL/min   GFR calc Af Amer >60 >60 mL/min    Comment: (NOTE) The eGFR has been calculated using the CKD EPI equation. This calculation has not been validated in all clinical situations. eGFR's persistently <60 mL/min signify possible Chronic Kidney Disease.    Anion gap 9 5 - 15    Comment: Performed at [facility]  CBC     Status: Abnormal   Collection Time: 06/09/15  7:25 PM  Result Value Ref Range   WBC 11.2 (H) 4.0 - 10.5 K/uL   RBC 4.75 3.87 - 5.11 MIL/uL   Hemoglobin 14.6 12.0 - 15.0 g/dL   HCT 43.2 36.0 - 46.0 %   MCV 90.9 78.0 - 100.0 fL   MCH 30.7 26.0 - 34.0 pg   MCHC 33.8 30.0 - 36.0 g/dL   RDW 12.5 11.5 - 15.5 %   Platelets 309 150 - 400 K/uL    Comment: Performed at [facility]  Hepatic function panel     Status: Abnormal   Collection Time: 06/09/15  7:25 PM  Result Value Ref Range   Total Protein 7.8 6.5 - 8.1 g/dL   Albumin 4.7 3.5 - 5.0 g/dL   AST 23 15 - 41 U/L   ALT 16 14 - 54 U/L   Alkaline Phosphatase 67 38 - 126 U/L   Total Bilirubin 0.5 0.3 - 1.2 mg/dL   Bilirubin, Direct <0.1 (L) 0.1 - 0.5  mg/dL   Indirect Bilirubin NOT CALCULATED 0.3 - 0.9 mg/dL    Comment: Performed at [facility]  Pregnancy, urine     Status: None   Collection  Time: 06/10/15  2:18 PM  Result Value Ref Range   Preg Test, Ur NEGATIVE NEGATIVE    Comment:        THE SENSITIVITY OF THIS METHODOLOGY IS >20 mIU/mL. Performed at [facility]     Physical Findings: AIMS:  , ,  ,  ,    CIWA:    COWS:     Treatment Plan Summary: Daily contact with patient to assess and evaluate symptoms and progress in treatment and Medication management Supportive approach/coping skills Psychosis; ; Abilify 5 mg BID . Prolactin level requested. Will also continue the Seroquel 100 mg HS. Will also revisit the idea of a mood stabilizer Will give medications some time to see its effect.  Medical Decision Making:  Review of Psycho-Social Stressors (1), Review or order clinical lab tests (1), Review of Medication Regimen & Side Effects (2) and Review of New Medication or Change in Dosage (2)     Geraldyn Shain 06/11/2015, 12:24 PM

## 2015-06-11 NOTE — [facility] Group Notes (Signed)
[facility] Group Notes:  (Clinical Social Work)  06/11/2015  11:15-12:00PM  Summary of Progress/Problems:   The main focus of today's process group was to discuss patients' feelings related to being hospitalized.  It was agreed in general by the group that it would be preferable to avoid future hospitalizations, and we discussed means of doing that.  As a follow-up, problems with adhering to medication recommendations were discussed.  The patient expressed their primary feeling about being hospitalized is "confined and restricted."  She later wanted to add a word, "Coerced," and gave a lecture to the group about the word while pacing the floor like a professor in front of a class.  She did say she feels the Abilify is helping her.  She raised her hand frequently in group to talk, was patient and able to tolerate waiting, and when called on would talk about something only remotely related to the topic, if at all.  Type of Therapy:  Group Therapy - Process  Participation Level:  Active  Participation Quality:  Inattentive and Monopolizing  Affect:  Anxious  Cognitive:  Disorganized  Insight:  Improving  Engagement in Therapy:  Engaged  Modes of Intervention:  Exploration, Discussion  Ambrose MantleMareida Grossman-Orr, LCSW 06/11/2015, 12:22 PM

## 2015-06-12 LAB — GABAPENTIN LEVEL: Gabapentin Lvl: <0.5 ug/mL

## 2015-06-12 MED ORDER — ARIPIPRAZOLE 10 MG PO TABS
10.0000 mg | ORAL_TABLET | Freq: Two times a day (BID) | ORAL | Status: DC
  Administered 2015-06-12 – 2015-06-15 (×6): 10 mg via ORAL
  Filled 2015-06-12 (×10): qty 1

## 2015-06-12 NOTE — [facility] Group Notes (Signed)
[facility] Group Notes:  (Nursing/MHT/Case Management/Adjunct)  Date:  06/12/2015  Time:  12:51 PM  Type of Therapy:  Psychoeducational Skills  Participation Level:  Active  Participation Quality:  Appropriate  Affect:  Appropriate  Cognitive:  Appropriate  Insight:  Appropriate  Engagement in Group:  Engaged  Modes of Intervention:  Discussion  Summary of Progress/Problems: Pt did attend self inventory group, pt reported that she was negative SI/HI, no AH/VH noted. Pt rated her depression as a 0, and her helplessness/hopelessness as a 1.     Pt reported no issues or concerns. Pt reported that her healthy support systems were her parents and sister.   Lisa BalintForrest, Lisa [name] Shanta 06/12/2015, 12:51 PM

## 2015-06-12 NOTE — Progress Notes (Signed)
Patient ID: Arman FilterCorina Emery, female   DOB: 02-27-1991, 24 y.o.   MRN: [id number]   D: Pt continues to be very delusional on the unit today. Pt was for discharge today due to her signing 72 hour request for discharge. Pt was seen by Dr. Fredda HammedAktar and advised her that it would be in her best interest to resend request or he was going to have to IVC her. Pt agreed to resend 72 hour request for discharge at 1130 today. Pt continues to have rapid mood swings, at times she is very happy other times she is upset and pacing the halls. Pt reported on her self inventory sheet that her depression was a 0, her hopelessness was a 0, and her anxiety was a 2. Pt reported that her goal for today was to go home.  Pt reported being negative SI/HI, no AH/VH noted. A: 15 min checks continued for patient safety. R: Pt safety maintained.

## 2015-06-12 NOTE — Progress Notes (Signed)
Patient ID: Lisa [name], female   DOB: August 28, 1991, 24 y.o.   MRN: [id number] [facility] MD Progress Note  06/12/2015 12:56 PM Lisa [name]  MRN:  [id number] Subjective:  Lisa [name] states that she is feeling better about her delusions.  Initially has presented with reports that, "she is being genetically modified and her father is a slave. She reported that she has brought something back from [place] where she visited. Has been non compliant with meds in past. Objective: She has been started on abilify and seroquel as in past her prolactin level was high. Prolactin level has been requested and as in process.  Still has been singing and reading book. Somewhat getting frustrated of being in hospital. Says when she was in [place] she felt there was bomb in the way food was made.  Denies hopelessness remained cooperative and agreed for another 72 hour hold. Has poor insight of her condition.  Principal Problem: <principal problem not specified> Diagnosis:   Patient Active Problem List   Diagnosis Date Noted  . Paranoia [F22] 06/08/2015  . Hyperprolactinemia [E22.1] 05/17/2015  . Bipolar disorder, current episode manic severe with psychotic features [F31.2] 05/13/2015   Total Time spent with patient: 30 minutes   Past Medical History:  Past Medical History  Diagnosis Date  . Anxiety   . Depression   . Bipolar disorder     Past Surgical History  Procedure Laterality Date  . Extraction of wisdom teeth     Family History:  Family History  Problem Relation Age of Onset  . Schizophrenia Mother   . Depression Father   . Seizures Sister    Social History:  History  Alcohol Use No     History  Drug Use No    History   Social History  . Marital Status: Single    Spouse Name: N/A  . Number of Children: N/A  . Years of Education: N/A   Social History Main Topics  . Smoking status: Former Smoker -- 0.00 packs/day  . Smokeless tobacco: Not on file  . Alcohol Use: No  .  Drug Use: No  . Sexual Activity: Yes     Comment: Plan B Pill   Other Topics Concern  . None   Social History Narrative   Additional History:    Sleep: Fair  Appetite:  Fair   Assessment:   Musculoskeletal: Strength & Muscle Tone: within normal limits Gait & Station: normal Patient leans: normal   Psychiatric Specialty Exam: Physical Exam  Review of Systems  Constitutional: Negative for fever.  Cardiovascular: Negative.   Neurological: Negative.  Negative for tremors and headaches.  Endo/Heme/Allergies: Negative.   Psychiatric/Behavioral: The patient is nervous/anxious.     Blood pressure 120/87, pulse 118, temperature 98.2 F (36.8 C), temperature source Oral, resp. rate 20, height  (1.626 m), weight 63.504 kg (140 lb), last menstrual period 05/12/2015, SpO2 100 %.Body mass index is 24.02 kg/(m^2).  General Appearance: Fairly Groomed  Patent attorney::  Fair  Speech:  Clear and Coherent  Volume:  fluctuates  Mood:  Somewhat elevated  Affect:  congruent  Thought Process:  Coherent and Goal Directed  Orientation:  Full (Time, Place, and Person)  Thought Content:  symptoms events worries concerns  Suicidal Thoughts:  No  Homicidal Thoughts:  No  Memory:  Immediate;   Fair Recent;   Fair Remote;   Fair  Judgement:  Fair  Insight:  Present and Shallow  Psychomotor Activity:  Normal  Concentration:  Poor (need to  repeat)  Recall:  Lisa HumanFair  Fund of Knowledge:Fair  Language: Fair  Akathisia:  No  Handed:  Left  AIMS (if indicated):     Assets:  Desire for Improvement Social Support  ADL's:  Intact  Cognition: WNL  Sleep:  Number of Hours: 6.5     Current Medications: Current Facility-Administered Medications  Medication Dose Route Frequency Provider Last Rate Last Dose  . acetaminophen (TYLENOL) tablet 650 mg  650 mg Oral Q6H PRN Adonis BrookSheila Agustin, NP      . alum & mag hydroxide-simeth (MAALOX/MYLANTA) 200-200-20 MG/5ML suspension 30 mL  30 mL Oral Q4H PRN  Adonis BrookSheila Agustin, NP      . ARIPiprazole (ABILIFY) tablet 5 mg  5 mg Oral BID Rachael FeeIrving A Lugo, MD   5 mg at 06/12/15 0814  . hydrOXYzine (ATARAX/VISTARIL) tablet 50 mg  50 mg Oral Q6H PRN Kerry HoughSpencer E Simon, PA-C   25 mg at 06/11/15 2115  . LORazepam (ATIVAN) tablet 1 mg  1 mg Oral Q6H PRN Rachael FeeIrving A Lugo, MD   1 mg at 06/11/15 1210  . magnesium hydroxide (MILK OF MAGNESIA) suspension 30 mL  30 mL Oral Daily PRN Adonis BrookSheila Agustin, NP      . QUEtiapine (SEROQUEL) tablet 100 mg  100 mg Oral QHS Rachael FeeIrving A Lugo, MD   100 mg at 06/11/15 2113    Lab Results:  Results for orders placed or performed during the hospital encounter of 06/08/15 (from the past 48 hour(s))  Pregnancy, urine     Status: None   Collection Time: 06/10/15  2:18 PM  Result Value Ref Range   Preg Test, Ur NEGATIVE NEGATIVE    Comment:        THE SENSITIVITY OF THIS METHODOLOGY IS >20 mIU/mL. [facility]     Physical Findings: AIMS:  , ,  ,  ,    CIWA:    COWS:     Treatment Plan Summary: Daily contact with patient to assess and evaluate symptoms and progress in treatment and Medication management Supportive approach/coping skills Psychosis; increase Abilify 10 mg BID for mood stabilization . Prolactin level requested. Continue 100mg  qhs for anxiet/sleep and bipolar. Depending upon prolactin level can further adjust abilify or consider other mood stabilizer instead of atypical antipsychotics. Will give medications some time to see its effect.  Medical Decision Making:  Review of Psycho-Social Stressors (1), Review or order clinical lab tests (1), Review of Medication Regimen & Side Effects (2) and Review of New Medication or Change in Dosage (2)     Lisa [name] 06/12/2015, 12:56 PM

## 2015-06-12 NOTE — Progress Notes (Signed)
D. Pt had been up and visible in milieu this evening, did attempt to participate in group activity but was unable to sit the entire time. Pt has been labile and continues to be delusional. Pt denies any SI or depression and did receive evening medications without incident. A. Support and encouragement provided. R. Safety maintained, will continue to monitor.

## 2015-06-12 NOTE — [facility] Group Notes (Signed)
[facility] Group Notes:  (Clinical Social Work)  06/12/2015  [facility] Group Notes:  (Clinical Social Work)  06/12/2015  11:00AM-12:00PM  Summary of Progress/Problems:  The main focus of today's process group was to listen to a variety of genres of music and to identify that different types of music provoke different responses.  The patient then was able to identify personally what was soothing for them, as well as energizing.  Handouts were used to record feelings evoked, as well as how patient can personally use this knowledge in sleep habits, with depression, and with other symptoms.  The patient expressed understanding of concepts, as well as knowledge of how each type of music affected him/her and how this can be used at home as a wellness/recovery tool.  She was up and down, dancing and singing very loudly, then leaving the room 10 or more times, returning.    Type of Therapy:  Music Therapy   Participation Level:  Active  Participation Quality:  Attentive and Intrusive  Affect:  Appropriate  Cognitive:  Disorganized  Insight:  Limited  Engagement in Therapy:  Engaged  Modes of Intervention:   Activity, Exploration  Ambrose MantleMareida Grossman-Orr, LCSW 06/12/2015

## 2015-06-12 NOTE — Progress Notes (Signed)
Car key was given to father with patient's permission.

## 2015-06-13 LAB — PROLACTIN: Prolactin: 18.6 ng/mL (ref 4.8–23.3)

## 2015-06-13 NOTE — [facility] Group Notes (Signed)
[facility] Note   06/13/2015 8:45 AM  Participation Quality:  Alert, Appropriate and Oriented  Mood/Affect:  Calm, blunted  Depression Rating:  0  Anxiety Rating:  3  Thoughts of Suicide:  Pt denies SI/HI  Will you contract for safety?   Yes  Current AVH:  Pt denies  Plan for Discharge/Comments:  Pt attended discharge planning group and actively participated in group.  CSW provided pt with today's workbook.  Pt states that she is doing well today but her ribs hurts when she was doing push ups this morning; CSW advised pt not to do push ups then.  Pt states that she plans to return home and follow up with Dr. Jannifer FranklinAkintayo.  Pt states that she doesn't have a therapist but would be open to following up with one at Dr. Gloris ManchesterAkintayo's office.  No further needs voiced by pt at this time.    Transportation Means: Pt reports access to transportation - pt reports mom will pick her up  Supports: Mom  Reyes IvanChelsea Horton, [place]CS[place] 06/13/2015 9:35 AM

## 2015-06-13 NOTE — Plan of Care (Signed)
Problem: Alteration in thought process Goal: STG-Patient does not respond to command hallucinations Outcome: Not Progressing Pt denies A/VH but makes delusional statements about been held here for human trafficking.

## 2015-06-13 NOTE — [facility] Group Notes (Signed)
  [facility] LCSW Group Therapy  06/13/2015   1:15 PM   Type of Therapy:  Group Therapy  Participation Level:  Minimal  Participation Quality: Minimal  Affect:  Calm  Cognitive:  Alert and Oriented  Insight:  Lacking, limited  Engagement in Therapy:  Lacking, limited  Modes of Intervention:  Clarification, Confrontation, Discussion, Education, Exploration, Limit-setting, Orientation, Problem-solving, Rapport Building, Dance movement psychotherapisteality Testing, Socialization and Support  Summary of Progress/Problems: Pt identified obstacles faced currently and processed barriers involved in overcoming these obstacles. Pt identified steps necessary for overcoming these obstacles and explored motivation (internal and external) for facing these difficulties head on. Pt further identified one area of concern in their lives and chose a goal to focus on for today. Pt was observed reading her book loudly, out loud, prior to group starting.  Pt did not participate in group discussion and left group early and did not return.    Lisa IvanChelsea Horton, LCSW 06/13/2015  3:25 PM

## 2015-06-13 NOTE — Tx Team (Signed)
Interdisciplinary Treatment Plan Update (Adult)  Date: 06/13/2015  Time Reviewed:  9:45 AM  Progress in Treatment: Attending groups: Yes Participating in groups:  Yes Taking medication as prescribed:  Yes Tolerating medication:  Yes Family/Significant othe contact made: No, N/A Patient understands diagnosis:  Yes Discussing patient identified problems/goals with staff:  Yes Medical problems stabilized or resolved:  Yes Denies suicidal/homicidal ideation: Yes Issues/concerns per patient self-inventory:  Yes Other:  New problem(s) identified: N/A  Discharge Plan or Barriers: Lisa [name] will return home and has follow up scheduled at [facility] for medication management.    Reason for Continuation of Hospitalization: Anxiety Depression Medication Stabilization Delusional  Comments: RN reports Lisa [name] continues to be delusional and somatic.  Lisa [name] can be observed singing very loudly in the hall during this treatment team meeting.     Estimated length of stay: 4-5 days  For review of initial/current patient goals, please see plan of care.  Attendees: Patient:     Family:     Physician:  Dr. Tawni CarnesSaranga 06/13/2015 9:55 AM   Nursing:   Waynetta SandyJan Wright, RN 06/13/2015 9:55 AM   Clinical Social Worker:  Reyes Ivanhelsea Horton, LCSW 06/13/2015 9:55 AM   Other: Sherryl Mangeslivette Wesseh, RN 06/13/2015 9:55 AM   Other:     Other:     Other:     Other:    Other:    Other:    Other:    Other:    Other:     Scribe for Treatment Team:   Carmina MillerHorton, Lanson Randle Nicole, 06/13/2015 9:55 AM

## 2015-06-13 NOTE — Progress Notes (Signed)
Adult Psychoeducational Group Note  Date:  06/13/2015 Time:  8:56 PM  Group Topic/Focus:  Wrap-Up Group:   The focus of this group is to help patients review their daily goal of treatment and discuss progress on daily workbooks.  Participation Level:  Active  Participation Quality:  Appropriate  Affect:  Appropriate  Cognitive:  Appropriate  Insight: Appropriate  Engagement in Group:  Engaged  Modes of Intervention:  Discussion  Additional Comments: The patient expressed that hshe attended group today.the patient also said that she talk about discharge plans.  Octavio Mannshigpen, Dontaye Hur Lee 06/13/2015, 8:56 PM

## 2015-06-13 NOTE — Plan of Care (Signed)
Problem: Alteration in mood; excessive anxiety as evidenced by: Goal: STG-Pt will report an absence of self-harm thoughts/actions (Patient will report an absence of self-harm thoughts or actions)  Outcome: Progressing Pt denies SI this shift when assessed. Safety maintained on Q 15 minutes checks as ordered without gestures or incident of self injurious behavior to report at this time.

## 2015-06-13 NOTE — Progress Notes (Signed)
Patient ID: Lisa [name], female   DOB: Apr 11, 1991, 24 y.o.   MRN: [id number] [facility] MD Progress Note  06/13/2015 12:19 PM Lisa [name]  MRN:  [id number] Subjective:  Lisa [name] states that she is feeling better about her delusions. Pt reports blurred vision has resolved, which she felt was a side effect of abilify. Pt feels healthier since she went for a walk outside. Mood is good.  Initially has presented with reports that, "she is being genetically modified and her father is a slave. She reported that she has brought something back from [place] where she visited. Has been non compliant with meds in past.  Objective: She has been started on abilify and seroquel as in past her prolactin level was high. Prolactin level has been requested and as in process.  Still has been singing and reading book. Somewhat getting frustrated of being in hospital, and feels she is ready to go home.  Cooperative on unit.  Has poor insight of her condition.  Principal Problem: <principal problem not specified> Diagnosis:   Patient Active Problem List   Diagnosis Date Noted  . Paranoia [F22] 06/08/2015  . Hyperprolactinemia [E22.1] 05/17/2015  . Bipolar disorder, current episode manic severe with psychotic features [F31.2] 05/13/2015   Total Time spent with patient: 20 minutes   Past Medical History:  Past Medical History  Diagnosis Date  . Anxiety   . Depression   . Bipolar disorder     Past Surgical History  Procedure Laterality Date  . Extraction of wisdom teeth     Family History:  Family History  Problem Relation Age of Onset  . Schizophrenia Mother   . Depression Father   . Seizures Sister    Social History:  History  Alcohol Use No     History  Drug Use No    History   Social History  . Marital Status: Single    Spouse Name: N/A  . Number of Children: N/A  . Years of Education: N/A   Social History Main Topics  . Smoking status: Former Smoker -- 0.00 packs/day  .  Smokeless tobacco: Not on file  . Alcohol Use: No  . Drug Use: No  . Sexual Activity: Yes     Comment: Plan B Pill   Other Topics Concern  . None   Social History Narrative   Additional History:    Sleep: Fair  Appetite:  Fair   Assessment:   Musculoskeletal: Strength & Muscle Tone: within normal limits Gait & Station: normal Patient leans: normal   Psychiatric Specialty Exam: Physical Exam  Review of Systems  Constitutional: Negative for fever.  Cardiovascular: Negative.   Neurological: Negative.  Negative for tremors and headaches.  Endo/Heme/Allergies: Negative.   Psychiatric/Behavioral: The patient is nervous/anxious.     Blood pressure 114/64, pulse 116, temperature 98 F (36.7 C), temperature source Oral, resp. rate 18, height  (1.626 m), weight 63.504 kg (140 lb), last menstrual period 05/12/2015, SpO2 100 %.Body mass index is 24.02 kg/(m^2).  General Appearance: Fairly Groomed  Patent attorney::  Fair  Speech:  Clear and Coherent  Volume:  fluctuates  Mood:  Somewhat elevated  Affect:  congruent  Thought Process:  Coherent and Goal Directed  Orientation:  Full (Time, Place, and Person)  Thought Content:  symptoms events worries concerns  Suicidal Thoughts:  No  Homicidal Thoughts:  No  Memory:  Immediate;   Fair Recent;   Fair Remote;   Fair  Judgement:  Fair  Insight:  Present  and Shallow  Psychomotor Activity:  Normal  Concentration:  fair  Recall:  FiservFair  Fund of Knowledge:Fair  Language: Fair  Akathisia:  No  Handed:  Left  AIMS (if indicated):     Assets:  Desire for Improvement Social Support  ADL's:  Intact  Cognition: WNL  Sleep:  Number of Hours: 6.5     Current Medications: Current Facility-Administered Medications  Medication Dose Route Frequency Provider Last Rate Last Dose  . acetaminophen (TYLENOL) tablet 650 mg  650 mg Oral Q6H PRN Adonis BrookSheila Agustin, NP      . alum & mag hydroxide-simeth (MAALOX/MYLANTA) 200-200-20 MG/5ML  suspension 30 mL  30 mL Oral Q4H PRN Adonis BrookSheila Agustin, NP      . ARIPiprazole (ABILIFY) tablet 10 mg  10 mg Oral BID Thresa RossNadeem Akhtar, MD   10 mg at 06/13/15 0803  . hydrOXYzine (ATARAX/VISTARIL) tablet 50 mg  50 mg Oral Q6H PRN Kerry HoughSpencer E Simon, PA-C   25 mg at 06/11/15 2115  . LORazepam (ATIVAN) tablet 1 mg  1 mg Oral Q6H PRN Rachael FeeIrving A Lugo, MD   1 mg at 06/11/15 1210  . magnesium hydroxide (MILK OF MAGNESIA) suspension 30 mL  30 mL Oral Daily PRN Adonis BrookSheila Agustin, NP      . QUEtiapine (SEROQUEL) tablet 100 mg  100 mg Oral QHS Rachael FeeIrving A Lugo, MD   100 mg at 06/12/15 2027    Lab Results:  No results found for this or any previous visit (from the past 48 hour(s)).  Physical Findings: AIMS: Facial and Oral Movements Muscles of Facial Expression: None, normal Lips and Perioral Area: None, normal Jaw: None, normal Tongue: None, normal,Extremity Movements Upper (arms, wrists, hands, fingers): None, normal Lower (legs, knees, ankles, toes): None, normal, Trunk Movements Neck, shoulders, hips: None, normal, Overall Severity Severity of abnormal movements (highest score from questions above): None, normal Incapacitation due to abnormal movements: None, normal Patient's awareness of abnormal movements (rate only patient's report): No Awareness, Dental Status Current problems with teeth and/or dentures?: No Does patient usually wear dentures?: No  CIWA:    COWS:     Treatment Plan Summary: Daily contact with patient to assess and evaluate symptoms and progress in treatment and Medication management Supportive approach/coping skills Psychosis; continue Abilify 10 mg BID for mood stabilization . Prolactin level requested. Continue seroquel 100mg  qhs for anxiety/sleep and bipolar. Depending upon prolactin level can further adjust abilify or consider other mood stabilizer instead of atypical antipsychotics. Will give medications some time to see its effect.  Medical Decision Making:  Review of  Psycho-Social Stressors (1), Review or order clinical lab tests (1), Review of Medication Regimen & Side Effects (2) and Review of New Medication or Change in Dosage (2)     Lisa [name] Dejoseph 06/13/2015, 12:19 PM

## 2015-06-13 NOTE — Progress Notes (Addendum)
D: Pt presents with congruent affect and anxious / labile mood this shift. Pt denied SI, HI, AVH and pain when assessed. Reports her depression, hopelessness and anxiety 0/10 on self inventory sheet. Pt's goal for today is to "go home with my family and loved ones" which she plans to attain by "following MD's orders and outpatient plan". Pt observed pacing in milieu, singing loudly at intervals, argumentative and defensive with staff. Verbal outburst Advertising account [id number] staff when redirected about behavior.  A: 1:1 contact made with pt to conduct shift assessment and address needs and concerns. All medications including PRN Vistaril for anxiety administered as per order and pt compliant. Support and availability offered. Pt encouraged to voice concerns. Safety maintained on Q 15 minutes checks as ordered.  R: Pt receptive to care. Required multiple verbal redirections at intervals during shift with some effect in conjunction with medications. Safety maintained on and off unit.

## 2015-06-13 NOTE — Progress Notes (Signed)
D: Patient reports she needs to file a police reports on this hospital for keeping her here for human trafficking. Pt had a phone call from her father before bed time that got pt really upset. Pt wanted to call the police to IVC his father because she was worried about her mother's safety. Pt denies SI/HI/AVH and pain. Pt attended and participated in evening wrap up group. Cooperative with assessment. No acute distressed noted at this time.   A: Met with pt 1:1. Medications administered as prescribed. Support and encouragement provided to attend groups and engage in milieu. Pt encouraged to discuss feelings and come to staff with any question or concerns.   R: Patient is safe and complaint with medications.

## 2015-06-14 DIAGNOSIS — F312 Bipolar disorder, current episode manic severe with psychotic features: Principal | ICD-10-CM

## 2015-06-14 MED ORDER — LAMOTRIGINE 25 MG PO TABS
25.0000 mg | ORAL_TABLET | Freq: Every day | ORAL | Status: DC
  Administered 2015-06-15: 25 mg via ORAL
  Filled 2015-06-14 (×4): qty 1

## 2015-06-14 NOTE — Clinical Social Work Note (Signed)
CSW spoke w patient's father re discharge plans.  Father states that he is frustrated w some of patient's behaviors and refusal to follow family rules/standards. Father admits to expressing anger at these choices during visit last night but stresses that he sincerely loves his daughter, wants her to live at home and wants to help w managing her mental illness.  However, he is concerned about choices she is making which he considers dangerous.  Has set a boundary w patient - is awaiting patient's decision re her willingness to accept family rules/structures.  Father given support, [facility] and encouraged to call their family support line.  Patient also referred to [facility], Attala SinkRita will assess patient for acceptanceinto [facility].  Santa GeneraAnne Cunningham, LCSW Clinical Social Worker

## 2015-06-14 NOTE — Progress Notes (Signed)
D- Patient is calm and pleasant.  She is observed interacting well with her peers in the milieu.  Denies SI, HI, AVH, and pain.  Patient denies any feelings of depression, hopelessness, or anxiety today.  She is focused on discharge plans. A- Scheduled medications administered to patient, per MD orders. Support and encouragement provided.  Routine safety checks conducted every 15 minutes.  Patient informed to notify staff with problems or concerns. R- No adverse drug reactions noted. Patient contracts for safety at this time. Patient compliant with medications and treatment plan. Patient receptive, calm, and cooperative. Patient remains safe at this time.

## 2015-06-14 NOTE — Progress Notes (Signed)
Adult Psychoeducational Group Note  Date:  06/14/2015 Time:  9:15 PM  Group Topic/Focus:  Wrap-Up Group:   The focus of this group is to help patients review their daily goal of treatment and discuss progress on daily workbooks.  Participation Level:  Active  Participation Quality:  Appropriate  Affect:  Appropriate  Cognitive:  Appropriate  Insight: Appropriate  Engagement in Group:  Engaged  Modes of Intervention:  Discussion  Additional Comments: The patient expressed that she attended group.The patient also said that the Serenity Prayer brought her clarity.  Octavio Mannshigpen, Kolbie Clarkston Lee 06/14/2015, 9:15 PM

## 2015-06-14 NOTE — [facility] Group Notes (Signed)
[facility] LCSW Group Therapy  06/14/2015 2:53 PM  Type of Therapy:  Group Therapy  Participation Level:  Active  Participation Quality:  Minimal, came in and out of group, was engaged when present  Affect:  Appropriate  Cognitive:  Alert  Insight:  Developing/Improving  Engagement in Therapy:  Developing/Improving  Modes of Intervention:  Discussion, Exploration and Problem-solving  Summary of Progress/Problems:  CSW facilitated group on Feelings about Diagnosis.  Patients were encouraged to explore their reactions to being diagnosed w mental illness, labels, stigma and expectations of self and others post diagnosis.  Patients explored constructive use of self to develop greater insight and resiliency regarding recovery from mental illness.  Patient identified her feeling as calm, discussed issues related to disagreements w treating providers over diagnosis and treatment.  [name], Lisa [name] 06/14/2015, 2:53 PM

## 2015-06-14 NOTE — Plan of Care (Signed)
Problem: Alteration in thought process Goal: STG-Patient is able to sleep at least 6 hours per night Outcome: Progressing Pt reports she is doing well. Pt slept over 6 hours last night.

## 2015-06-14 NOTE — Progress Notes (Signed)
[facility]  06/14/2015 8:01 PM [name]  MRN:  [id number] Subjective:  Lisa GuppyCorina states that she is starting to feel better with the Abilify. Will rather not changed it. We discussed the possibility of hyperprolactinemia with the Invega. She was on Lamictal and states it was D/C in an attempt to simplify  her medications. Looking back states she liked the Lamictal and would not mind going back on it as she is still having some milder mood swings more towards the down side. She states that she has always been on the hyper side and hoped this is not seen as part of her Bipolar. State she cant state seated for too long and she thinks better when she moves. She shares that she had a big fight with her father last night as she is seeing an older man they do not approve of. States she was told that her things were going to be put outside for her to pick up when she is D/C. She still wants to pursue this relationship. She hopes her sister will be willing to allow her to stay at her house Principal Problem: Bipolar disorder, current episode manic severe with psychotic features Diagnosis:   Patient Active Problem List   Diagnosis Date Noted  . Paranoia [F22] 06/08/2015  . Hyperprolactinemia [E22.1] 05/17/2015  . Bipolar disorder, current episode manic severe with psychotic features [F31.2] 05/13/2015   Total Time spent with patient: 30 minutes   Past Medical History:  Past Medical History  Diagnosis Date  . Anxiety   . Depression   . Bipolar disorder     Past Surgical History  Procedure Laterality Date  . Extraction of wisdom teeth     Family History:  Family History  Problem Relation Age of Onset  . Schizophrenia Mother   . Depression Father   . Seizures Sister    Social History:  History  Alcohol Use No     History  Drug Use No    History   Social History  . Marital Status: Single    Spouse Name: N/A  . Number of Children: N/A  . Years of Education: N/A   Social  History Main Topics  . Smoking status: Former Smoker -- 0.00 packs/day  . Smokeless tobacco: Not on file  . Alcohol Use: No  . Drug Use: No  . Sexual Activity: Yes     Comment: Plan B Pill   Other Topics Concern  . None   Social History Narrative   Additional History:    Sleep: Fair  Appetite:  Fair   Assessment:   Musculoskeletal: Strength & Muscle Tone: within normal limits Gait & Station: normal Patient leans: normal   Psychiatric Specialty Exam: Physical Exam  Review of Systems  Constitutional: Negative.   HENT: Negative.   Eyes: Negative.   Respiratory: Negative.   Cardiovascular: Negative.   Gastrointestinal: Negative.   Genitourinary: Negative.   Musculoskeletal: Negative.   Skin: Negative.   Neurological: Negative.   Endo/Heme/Allergies: Negative.   Psychiatric/Behavioral: The patient is nervous/anxious.     Blood pressure 110/59, pulse 140, temperature 98.4 F (36.9 C), temperature source Oral, resp. rate 20, height 5\' 4"  (1.626 m), weight 63.504 kg (140 lb), last menstrual period 05/12/2015, SpO2 100 %.Body mass index is 24.02 kg/(m^2).  General Appearance: Fairly Groomed  Patent attorneyye Contact::  Fair  Speech:  Clear and Coherent  Volume:  fluctuates  Mood:  Anxious  Affect:  anxious worried  Thought Process:  Coherent and  Goal Directed  Orientation:  Full (Time, Place, and Person)  Thought Content:  symptoms events worries concerns  Suicidal Thoughts:  No  Homicidal Thoughts:  No  Memory:  Immediate;   Fair Recent;   Fair Remote;   Fair  Judgement:  Fair  Insight:  Present and Shallow  Psychomotor Activity:  able to stay seated for longer periods of time  Concentration:  Fair  Recall:  [employer] of Knowledge:Fair  Language: Fair  Akathisia:  No  Handed:  Right  AIMS (if indicated):     Assets:  Desire for Improvement  ADL's:  Intact  Cognition: WNL  Sleep:  Number of Hours: 6.5     Current Medications: Current Facility-Administered  Medications  Medication Dose Route Frequency Provider Last Rate Last Dose  . acetaminophen (TYLENOL) tablet 650 mg  650 mg Oral Q6H PRN Adonis Brook, NP      . alum & mag hydroxide-simeth (MAALOX/MYLANTA) 200-200-20 MG/5ML suspension 30 mL  30 mL Oral Q4H PRN Adonis Brook, NP      . ARIPiprazole (ABILIFY) tablet 10 mg  10 mg Oral BID Thresa Ross, MD   10 mg at 06/14/15 1619  . hydrOXYzine (ATARAX/VISTARIL) tablet 50 mg  50 mg Oral Q6H PRN Kerry Hough, PA-C   50 mg at 06/13/15 1636  . [START ON 06/15/2015] lamoTRIgine (LAMICTAL) tablet 25 mg  25 mg Oral Daily Rachael Fee, MD      . LORazepam (ATIVAN) tablet 1 mg  1 mg Oral Q6H PRN Rachael Fee, MD   1 mg at 06/13/15 2220  . magnesium hydroxide (MILK OF MAGNESIA) suspension 30 mL  30 mL Oral Daily PRN Adonis Brook, NP      . QUEtiapine (SEROQUEL) tablet 100 mg  100 mg Oral QHS Rachael Fee, MD   100 mg at 06/13/15 2116    Lab Results: No results found for this or any previous visit (from the past 48 hour(s)).  Physical Findings: AIMS: Facial and Oral Movements Muscles of Facial Expression: None, normal Lips and Perioral Area: None, normal Jaw: None, normal Tongue: None, normal,Extremity Movements Upper (arms, wrists, hands, fingers): None, normal Lower (legs, knees, ankles, toes): None, normal, Trunk Movements Neck, shoulders, hips: None, normal, Overall Severity Severity of abnormal movements (highest score from questions above): None, normal Incapacitation due to abnormal movements: None, normal Patient's awareness of abnormal movements (rate only patient's report): No Awareness, Dental Status Current problems with teeth and/or dentures?: No Does patient usually wear dentures?: No  CIWA:    COWS:     Treatment Plan Summary: Daily contact with patient to assess and evaluate symptoms and progress in treatment and Medication management Supportive approach/coping skills Mood disorder; continue the Abilify at 10 mg BID   Insomnia; continue the Seroquel at 100 mg HS Resume the Lamictal at a starting dose to provide further mood stabilization ( the effect upon oral contraceptives was discussed) Will get more collateral information in terms of her being able to go back home or not   Medical Decision Making:  Review of Psycho-Social Stressors (1), Review of Medication Regimen & Side Effects (2) and Review of New Medication or Change in Dosage (2)     Ilyse Tremain A 06/14/2015, 8:01 PM

## 2015-06-14 NOTE — [facility] Group Notes (Signed)
[facility] Group Notes:  (Nursing/MHT/Case Management/Adjunct)  Date:  06/14/2015  Time:  0900  Type of Therapy:  Nurse Education  Participation Level:  Active  Participation Quality:  Appropriate and Attentive  Affect:  Appropriate  Cognitive:  Alert and Appropriate  Insight:  Appropriate  Engagement in Group:  Engaged  Modes of Intervention:  Education  Summary of Progress/Problems: Patient was attentive and engaged for group.  Patient was particularly interested finding out that she is able to write down topics that she would like to discuss with the provider. Patient shared that one place she would love to visit is [place].  Larry SierrasMiddleton, Delmos Velaquez P 06/14/2015, 12:06 PM

## 2015-06-14 NOTE — Progress Notes (Signed)
D: Patient has been appropriate this evening. Pt observed interacting well with peers. Pt had a visitor which she reports made her day. Pt denies SI/HI/AVH and pain. Pt attended and participated in evening wrap up group. Cooperative with assessment. No acute distressed noted at this time.   A: Met with pt 1:1. Medications administered as prescribed. Support and encouragement provided. Pt encouraged to discuss feelings and come to staff with any question or concerns.   R: Patient is safe and complaint with medications.

## 2015-06-15 DIAGNOSIS — F22 Delusional disorders: Secondary | ICD-10-CM

## 2015-06-15 MED ORDER — LAMOTRIGINE 25 MG PO TABS: 25.0000 mg | ORAL_TABLET | Freq: Every day | ORAL | Status: DC

## 2015-06-15 MED ORDER — QUETIAPINE FUMARATE 100 MG PO TABS: 100.0000 mg | ORAL_TABLET | Freq: Every day | ORAL | Status: DC

## 2015-06-15 MED ORDER — HYDROXYZINE HCL 50 MG PO TABS: 50.0000 mg | ORAL_TABLET | Freq: Four times a day (QID) | ORAL | Status: DC | PRN

## 2015-06-15 MED ORDER — ARIPIPRAZOLE 10 MG PO TABS: 10.0000 mg | ORAL_TABLET | Freq: Two times a day (BID) | ORAL | Status: DC

## 2015-06-15 NOTE — [facility] Suicide Risk Assessment (Signed)
[facility] Discharge Suicide Risk Assessment   Demographic Factors:  Caucasian  Total Time spent with patient: 30 minutes  Musculoskeletal: Strength & Muscle Tone: within normal limits Gait & Station: normal Patient leans: normal  Psychiatric Specialty Exam: Physical Exam  Review of Systems  Constitutional: Negative.   HENT: Negative.   Eyes: Negative.   Respiratory: Negative.   Cardiovascular: Negative.   Gastrointestinal: Negative.   Genitourinary: Negative.   Musculoskeletal: Negative.   Skin: Negative.   Neurological: Negative.   Endo/Heme/Allergies: Negative.   Psychiatric/Behavioral: Negative.     Blood pressure 110/59, pulse 140, temperature 98.4 F (36.9 C), temperature source Oral, resp. rate 20, height  (1.626 m), weight 63.504 kg (140 lb), last menstrual period 05/12/2015, SpO2 100 %.Body mass index is 24.02 kg/(m^2).  General Appearance: Fairly Groomed  Patent attorney::  Fair  Speech:  Clear and Coherent409  Volume:  Normal  Mood:  Euthymic  Affect:  Appropriate  Thought Process:  Coherent and Goal Directed  Orientation:  Full (Time, Place, and Person)  Thought Content:  plans as she moves on  Suicidal Thoughts:  No  Homicidal Thoughts:  No  Memory:  Immediate;   Fair Recent;   Fair Remote;   Fair  Judgement:  Fair  Insight:  Present  Psychomotor Activity:  Normal  Concentration:  Fair  Recall:  [employer] of Knowledge:Fair  Language: Fair  Akathisia:  No  Handed:  Right  AIMS (if indicated):     Assets:  Desire for Improvement Housing Social Support  Sleep:  Number of Hours: 6.75  Cognition: WNL  ADL's:  Intact   Have you used any form of tobacco in the last 30 days? (Cigarettes, Smokeless Tobacco, Cigars, and/or Pipes): No  Has this patient used any form of tobacco in the last 30 days? (Cigarettes, Smokeless Tobacco, Cigars, and/or Pipes) No  Mental Status Per Nursing Assessment::   On Admission:     Current Mental Status by Physician: IN  full contact with reality. There are no active SI plans or intent. She states she feels much better. Objectively she is calm, logical, rational and does not seem to be responding to internal stimuli. She states she talked to her family and that if  what it will take for her to be able to have them in her life is not to see his female friend again she is willing to do it. She plans to go home, will get a medical withdrawal for next semester, concentrate on getting healthier. Plans to comply with her treatment   Loss Factors: Loss of significant relationship  Historical Factors: Family history of mental illness or substance abuse  Risk Reduction Factors:   Sense of responsibility to family, Living with another person, especially a relative and Positive social support  Continued Clinical Symptoms:  Bipolar Disorder:   Depressive phase  Cognitive Features That Contribute To Risk:  Closed-mindedness, Polarized thinking and Thought constriction (tunnel vision)    Suicide Risk:  Minimal: No identifiable suicidal ideation.  Patients presenting with no risk factors but with morbid ruminations; may be classified as minimal risk based on the severity of the depressive symptoms  Principal Problem: Bipolar disorder, current episode manic severe with psychotic features Discharge Diagnoses:  Patient Active Problem List   Diagnosis Date Noted  . Paranoia [F22] 06/08/2015  . Hyperprolactinemia [E22.1] 05/17/2015  . Bipolar disorder, current episode manic severe with psychotic features [F31.2] 05/13/2015    Follow-up Information    [facility] On 06/16/2015.   Why:  Thursday at 4:00 with Dr A.  Follow that up with Thursday the 21st at 12:15 with Dr A.   Contact information:   [address]  [place] [phone]      Follow up with [facility] On 06/21/2015.   Why:  Tuesday at 8:30    Contact information:   [address]  [facility] [place] [phone]      Plan  Of Care/Follow-up recommendations:  Activity:  as tolerated Diet:  regular Follow up as above Is patient on multiple antipsychotic therapies at discharge:  No   Has Patient had three or more failed trials of antipsychotic monotherapy by history:  No  Recommended Plan for Multiple Antipsychotic Therapies: NA    Martita Brumm A 06/15/2015, 4:52 PM

## 2015-06-15 NOTE — [facility] Group Notes (Signed)
[facility]   06/15/2015 10:33 AM  Participation Quality:  Engaged  Mood/Affect:  Appropriate  Depression Rating:  denies  Anxiety Rating:  denies  Thoughts of Suicide:  No Will you contract for safety?   NA  Current AVH:  denies  Plan for Discharge/Comments:  Greeted me warmly this AM.  Mood good. States she is happy to be back on Lamictal again, that she feels her delusions are under control, and that she is planning on returning to parents.  Said nothing about conversation with them about her boyfriend.    Transportation Means:  family  Supports: family  Kiribatiorth, Lisa DaubRodney [name]

## 2015-06-15 NOTE — Discharge Summary (Signed)
Physician Discharge Summary Note  Patient:  Lisa FilterCorina Valera is an 24 y.o., female MRN:  [id number] DOB:  12/06/1990 Patient phone:  [phone] (home)  Patient address:   [address] [place] [id number],  Total Time spent with patient: 45 minutes  Date of Admission:  06/08/2015 Date of Discharge: 06/15/2015  Reason for Admission:  psychosis  Principal Problem: Bipolar disorder, current episode manic severe with psychotic features Discharge Diagnoses: Patient Active Problem List   Diagnosis Date Noted  . Paranoia [F22] 06/08/2015  . Hyperprolactinemia [E22.1] 05/17/2015  . Bipolar disorder, current episode manic severe with psychotic features [F31.2] 05/13/2015    Musculoskeletal: Strength & Muscle Tone: within normal limits Gait & Station: normal Patient leans: N/A  Psychiatric Specialty Exam:  SEE SRA Physical Exam  Vitals reviewed.   Review of Systems  All other systems reviewed and are negative.   Blood pressure 110/59, pulse 140, temperature 98.4 F (36.9 C), temperature source Oral, resp. rate 20, height 5\' 4"  (1.626 m), weight 63.504 kg (140 lb), last menstrual period 05/12/2015, SpO2 100 %.Body mass index is 24.02 kg/(m^2).  Have you used any form of tobacco in the last 30 days? (Cigarettes, Smokeless Tobacco, Cigars, and/or Pipes): No  Has this patient used any form of tobacco in the last 30 days? (Cigarettes, Smokeless Tobacco, Cigars, and/or Pipes) N/A  Past Medical History:  Past Medical History  Diagnosis Date  . Anxiety   . Depression   . Bipolar disorder     Past Surgical History  Procedure Laterality Date  . Extraction of wisdom teeth     Family History:  Family History  Problem Relation Age of Onset  . Schizophrenia Mother   . Depression Father   . Seizures Sister    Social History:  History  Alcohol Use No     History  Drug Use No    History   Social History  . Marital Status: Single    Spouse Name: N/A  . Number of Children:  N/A  . Years of Education: N/A   Social History Main Topics  . Smoking status: Former Smoker -- 0.00 packs/day  . Smokeless tobacco: Not on file  . Alcohol Use: No  . Drug Use: No  . Sexual Activity: Yes     Comment: Plan B Pill   Other Topics Concern  . None   Social History Narrative   Risk to Self: Suicidal Ideation: No Suicidal Intent: No Is patient at risk for suicide?: No Suicidal Plan?: No Access to Means: No What has been your use of drugs/alcohol within the last 12 months?: NA How many times?: 0 Other Self Harm Risks: NA Triggers for Past Attempts: None known (NA) Intentional Self Injurious Behavior: None Risk to Others: Homicidal Ideation: No Thoughts of Harm to Others: No Current Homicidal Intent: No Current Homicidal Plan: No Access to Homicidal Means: No Identified Victim: None Reported History of harm to others?: No Assessment of Violence: None Noted Violent Behavior Description: None Reported Does patient have access to weapons?: No Criminal Charges Pending?: No Does patient have a court date: No Prior Inpatient Therapy: Prior Inpatient Therapy: Yes Prior Therapy Dates: 2016 [facility] Reason for Treatment: Delusions Prior Outpatient Therapy: Prior Outpatient Therapy: Yes Prior Therapy Dates: Ongoing  [facility] Reason for Treatment: Medication Management Does patient have an ACCT team?: No Does patient have Intensive In-House Services?  : No Does patient have [facility] services? : No Does patient have P4CC  services?: No  Level of Care:  OP  Hospital Course:  24 Y/O female who states she felt " a little bit delusional" feeling that some one tried to kill her family members. States she felt that she would be safe coming here. States she cant think of something specific other than her sister making a comment that she was not doing well. She has been compliant with her medications.  But according to her family she has not been doing well even when she left here last time. She has continued to evidence a lot of mood instability  Brigit Poplin was admitted for Bipolar disorder, current episode manic severe with psychotic features and crisis management.  She was treated discharged with the medications listed below under Medication List.  Medical problems were identified and treated as needed.  Home medications were restarted as appropriate.  Improvement was monitored by observation and Pearlena Jeangilles daily report of symptom reduction.  Emotional and mental status was monitored by daily self-inventory reports completed by Lisa [name] and clinical staff.         Letasha Marich was evaluated by the treatment team for stability and plans for continued recovery upon discharge.  Sanaya Reidel motivation was an integral factor for scheduling further treatment.  Employment, transportation, bed availability, health status, family support, and any pending legal issues were also considered during /her hospital stay.  She was offered further treatment options upon discharge including but not limited to Residential, Intensive Outpatient, and Outpatient treatment.  Kierrah Kilbride will follow up with the services as listed below under Follow Up Information.     Upon completion of this admission the patient was both mentally and medically stable for discharge denying suicidal/homicidal ideation, auditory/visual/tactile hallucinations, delusional thoughts and paranoia.      Consults:  psychiatry  Significant Diagnostic Studies:  labs: per ED  Discharge Vitals:   Blood pressure 110/59, pulse 140, temperature 98.4 F (36.9 C), temperature source Oral, resp. rate 20, height  (1.626 m), weight 63.504 kg (140 lb), last menstrual period 05/12/2015, SpO2 100 %. Body mass index is 24.02 kg/(m^2). Lab Results:   No results found for this or any previous visit (from the past 72 hour(s)).  Physical  Findings: AIMS: Facial and Oral Movements Muscles of Facial Expression: None, normal Lips and Perioral Area: None, normal Jaw: None, normal Tongue: None, normal,Extremity Movements Upper (arms, wrists, hands, fingers): None, normal Lower (legs, knees, ankles, toes): None, normal, Trunk Movements Neck, shoulders, hips: None, normal, Overall Severity Severity of abnormal movements (highest score from questions above): None, normal Incapacitation due to abnormal movements: None, normal Patient's awareness of abnormal movements (rate only patient's report): No Awareness, Dental Status Current problems with teeth and/or dentures?: No Does patient usually wear dentures?: No  CIWA:    COWS:      See Psychiatric Specialty Exam and Suicide Risk Assessment completed by Attending Physician prior to discharge.  Discharge destination:  Home  Is patient on multiple antipsychotic therapies at discharge:  Yes,   Do you recommend tapering to monotherapy for antipsychotics?  No   Has Patient had three or more failed trials of antipsychotic monotherapy by history:  No.  Patient has been on Risperdal, Invega, Geodon, Haldol     Recommended Plan for Multiple Antipsychotic Therapies: NA     Medication List    STOP taking these medications        ALEVE 220 MG tablet  Generic drug:  naproxen sodium     benztropine  1 MG tablet  Commonly known as:  COGENTIN     gabapentin 100 MG capsule  Commonly known as:  NEURONTIN     LORazepam 0.5 MG tablet  Commonly known as:  ATIVAN     VITAMIN B COMPLEX-C PO      TAKE these medications      Indication   ARIPiprazole 10 MG tablet  Commonly known as:  ABILIFY  Take 1 tablet (10 mg total) by mouth 2 (two) times daily.   Indication:  mood stabilization     hydrOXYzine 50 MG tablet  Commonly known as:  ATARAX/VISTARIL  Take 1 tablet (50 mg total) by mouth every 6 (six) hours as needed for anxiety.   Indication:  Anxiety Neurosis      lamoTRIgine 25 MG tablet  Commonly known as:  LAMICTAL  Take 1 tablet (25 mg total) by mouth daily.   Indication:  Mood Stabilization     QUEtiapine 100 MG tablet  Commonly known as:  SEROQUEL  Take 1 tablet (100 mg total) by mouth at bedtime.   Indication:  Mood Stabilization           Follow-up Information    [facility] On 06/16/2015.   Why:  Thursday at 4:00 with Dr A.  Follow that up with Thursday the 21st at 12:15 with Dr A.   Contact information:   [address]  [place] [phone]      Follow up with [facility] On 06/21/2015.   Why:  Tuesday at 8:30    Contact information:   [address]  [place] [phone]] [phone]      Follow-up recommendations:  Activity:  as tol, diet as tol  Comments:  1.  Take all your medications as prescribed.              2.  Report any adverse side effects to outpatient provider.                       3.  Patient instructed to not use alcohol or illegal drugs while on prescription medicines.            4.  In the event of worsening symptoms, instructed patient to call 911, the crisis hotline or go to nearest emergency room for evaluation of symptoms.  Total Discharge Time: 40 min  Signed: Velna Hatchet May Agustin AGNP-BC 06/15/2015, 7:04 PM  I personally assessed the patient and formulated the plan Madie Reno A. Dub Mikes, M.D.

## 2015-06-15 NOTE — Progress Notes (Signed)
Patient ID: [name], female   DOB: 05-12-1991, 24 y.o.   MRN: [id number] DIS - CHARGE  NOTE  ---  DC pt. As ordered.  All possessions were returned.  All prescriptions were provided and explained.  Pt., was bright,happy and making positive statements.  She had good eye contact and agreed to contract for safety and promised to stay safe at home.  She promised to attend all out-pt. Appointments and to remain complain on medications as prescribed.   Pt. Denied SI / HI / HA or pain at time of DC.  --- A --  Escort pt. To front lobby where she was to waite for her father to picker her up and transport home at 1420 Hrs. , 06/15/15.  --- R --  Pt. Was safe and happy at time of dis-charge

## 2015-06-16 ENCOUNTER — Telehealth (HOSPITAL_COMMUNITY): Payer: Self-pay | Admitting: Licensed Clinical Social Worker

## 2015-06-16 ENCOUNTER — Encounter (HOSPITAL_COMMUNITY): Payer: Self-pay | Admitting: Licensed Clinical Social Worker

## 2015-06-16 ENCOUNTER — Other Ambulatory Visit (HOSPITAL_COMMUNITY): Payer: BLUE CROSS/BLUE SHIELD | Attending: Psychiatry | Admitting: Licensed Clinical Social Worker

## 2015-06-16 DIAGNOSIS — F319 Bipolar disorder, unspecified: Secondary | ICD-10-CM | POA: Insufficient documentation
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
## 2015-06-16 NOTE — Progress Notes (Signed)
  [facility]  Late Entry for Lisa [name]  Will you be returning to the same living situation after discharge:  Yes, family At discharge, do you have transportation home?: Yes,  w family Do you have the ability to pay for your medications: Yes,  insurance  Release of information consent forms completed and in the chart;  Patient's signature needed at discharge.  Patient to Follow up at: Follow-up Information    Follow up with [facility] On 06/16/2015.   Why:  Thursday at 4:00 with Dr A.  Follow that up with Thursday the 21st at 12:15 with Dr A.   Contact information:   [address]  [place] [phone]      Follow up with [facility] On 06/21/2015.   Why:  Tuesday at 8:30    Contact information:   [address]  [facility] [phone]        Safety Planning and Suicide Prevention discussed: Pt declined collateral contact, reviewed w patient  Have you used any form of tobacco in the last 30 days? (Cigarettes, Smokeless Tobacco, Cigars, and/or Pipes): No  Has patient been referred to the Quitline?: N/A patient is not a smoker  [name], Lisa Vitelli C 06/16/2015, 6:38 PM

## 2015-06-17 ENCOUNTER — Ambulatory Visit (HOSPITAL_COMMUNITY): Payer: Self-pay | Admitting: Licensed Clinical Social Worker

## 2015-06-17 ENCOUNTER — Other Ambulatory Visit (HOSPITAL_COMMUNITY): Payer: BLUE CROSS/BLUE SHIELD | Admitting: Licensed Clinical Social Worker

## 2015-06-17 DIAGNOSIS — F250 Schizoaffective disorder, bipolar type: Secondary | ICD-10-CM

## 2015-06-17 DIAGNOSIS — F323 Major depressive disorder, single episode, severe with psychotic features: Secondary | ICD-10-CM

## 2015-06-17 DIAGNOSIS — F200 Paranoid schizophrenia: Secondary | ICD-10-CM

## 2015-06-17 DIAGNOSIS — F4310 Post-traumatic stress disorder, unspecified: Secondary | ICD-10-CM

## 2015-06-17 DIAGNOSIS — F22 Delusional disorders: Secondary | ICD-10-CM

## 2015-06-17 DIAGNOSIS — F319 Bipolar disorder, unspecified: Secondary | ICD-10-CM | POA: Diagnosis not present

## 2015-06-17 DIAGNOSIS — F3160 Bipolar disorder, current episode mixed, unspecified: Secondary | ICD-10-CM

## 2015-06-17 NOTE — Psych (Signed)
   [facility]  Arman FilterCorina Vesey [id number]   [facility]  Date: 06/17/15  Time: 11:00 AM-12:00 PM  Group Topic/Focus:  The purpose of the group is to establish therapeutic rapport and to establish milieu where patients feel supported. Therapist reinforced healthy coping strategies shared by patients.     Participation Level:  Active  Participation Quality:  Sharing  Affect:  Excited  Cognitive:  Lacking  Insight: Limited  Engagement in Group:  Distracting, Engaged and Off Topic  Modes of Intervention:  Discussion, Education, Exploration, Support and Coping Skills  Additional Comments: Group topic related to establishing rapport and relationship building through patients sharing why they sought treatment and goals of their treatment. Group started with ice breaker and check in. Patients were able to establish commonality with some of their symptoms that they experienced and their struggles with mental health. Through the discussion the group was able to establish a milieu that could be supportive for patients and also where they will be able to learn from each other. Therapeutic relationship was starting to develop with patient. The group was helpful as patients shared coping strategies and therapist reinforced ones that were healthy strategies. She presents with tangential thinking, elevated mood, restless and off topic. She has some insight about delusional thinking but also reluctant to give up some of what appears to be delusions. PHP is appropriate for her to help her to stabilize with symptoms.   Suicidal/Homicidal: no  Plan: 1.Therapist work with patient to gain insight to coping tools to help her stabilize with her symptoms.2.Therapist provide supportive interventions and continue to develop therapeutic relationship.   [facility] PHP THERAPIST GROUP NOTE  Date: 06/17/15   Time: 12:30 PM-2:00 PM  Group Topic/Focus:  Peer Specialist Run  Group-Keeping an Open Mind in Recovery  Participation Level:  Active  Participation Quality:  Sharing and Distracted  Affect:  Excited  Cognitive:  Distracted, Tangential, Off topic  Insight: Limited  Engagement in Group:  Engaged and Off Topic  Modes of Intervention:  Activity, Discussion, Education, Exploration, Problem-solving and CBT, coping  Additional Comments: Therapist observed while peer specialist ran group on topic of "Keeping an open Mind" and how that could help with recovery. He discussed barriers to an open mind and qualities of a person who has an open mind. This included the quality of challenging longstanding ideals and beliefs, question your perspective and accepting possibilities. Shannan identified grounding as helping to support her in her recovery. She remains off topic, restless and had to move around the room during group, tangential, elevated mood and distracted. She shows investment and engagement with group that is a positive sign for her working on issues and making progress.  Suicidal/Homicidal: no  Plan:1.Therapist work with patient gaining insight to coping tools that will be helpful for her to manage symptoms and help her to stabilize.2.Patient learn and apply helpful coping strategies to symptoms.   Diagnosis: Primary Diagnosis: Schizoaffective disorder, bipolar type [F25.0]    1. Schizoaffective disorder, bipolar type       [name],[name] 06/17/2015

## 2015-06-17 NOTE — Progress Notes (Signed)
Date: 06/17/15 Time of encounter: 12:30 PM - 2:00 PM  Purpose: To investigate personal definitions of "open mindedness" by working through barriers to recovery. Intervention: Addressed individual definitions of "open mindedness" through steps of: challenging long-standing ideals an beliefs, questioning personal perspective, and practicing accepting possibilities in recovery. Stressed approaching "positive possibilities" to confront unhealthy constants currently present in group members' lives. Effect: Lisa [name] defined open-mindednes as "loving and accepting change" through "openness to expression". She isolated self-fulfilling prophecising as being a barrier to recovery. She stated that open mindedness personally involves openness to stimulus, which leads to "educational improvement in recovery".  Donnamarie Rossettiodd Chaos Carlile CPSS

## 2015-06-17 NOTE — Progress Notes (Signed)
Adult Psychoeducational Group Note  Date:  06/17/2015 Time:  4:35 PM  Group Topic/Focus: Coping With Mental Health Crisis:   The purpose of this group is to help patients identify strategies for coping with mental health crisis.  Group discusses possible causes of crisis and ways to manage them effectively. Developing a Wellness Toolbox:   The focus of this group is to help patients develop a "wellness toolbox" with skills and strategies to promote recovery upon discharge. Healthy Communication:   The focus of this group is to discuss communication, barriers to communication, as well as healthy ways to communicate with others.  Participation Level:  Active  Participation Quality:  Monopolizing and Redirectable  Affect:  Anxious and Excited  Cognitive:  Disorganized, Delusional and Tangential  Insight: Limited  Engagement in Group:  Distracting, Monopolizing, Off Topic, Poor and Displaying a form of "word salad", hypomanic, restless, delusional  Modes of Intervention:  Activity, Discussion, Education, Exploration and Problem-solving  Additional Comments:  Patients were assisted in developing better coping skills and building confidence through their ability to manage stress related incidents. Cognitive Behavioral Therapy techniques were used to help patients identify, explore and develop a plan to change distorted and/or negative thought patterns and behaviors. Patients were asked to identify thoughts, feelings and triggers related to past and perceived future occurrences. Therapist aided patients in developing reframe statements to correct negative, fatalistic thought patterns. Patients were assisted with exploring worries, fears and ritualistic directed behaviors.  Therapist continually assessed for risk patterns associated with today's discussion topics. Therapist discussed and reviewed limits and exposure associated with fear triggers. Feedback was continually provided to assist patients  with continuing to explore, restate and create new coping skills to address self-defeating thought configurations. Therapist encouraged patients to identify cognitive activities to build into their day. Patients were provided with examples of cognitive challenges to build into the day: including reading, playing instruments, writing poetry/books/songs, learning lyrics to a new song, puzzles, and games.    Lisa [name] 06/17/2015, 4:35 PM

## 2015-06-20 ENCOUNTER — Other Ambulatory Visit (HOSPITAL_COMMUNITY): Payer: BLUE CROSS/BLUE SHIELD | Admitting: Licensed Clinical Social Worker

## 2015-06-20 DIAGNOSIS — F250 Schizoaffective disorder, bipolar type: Secondary | ICD-10-CM

## 2015-06-20 DIAGNOSIS — F319 Bipolar disorder, unspecified: Secondary | ICD-10-CM | POA: Diagnosis not present

## 2015-06-20 NOTE — Psych (Signed)
   [facility]  [name] [id number]   Date: 06/20/15  Time: 11:00 AM-12:00 PM    Group Topic/Focus:  The group topic related to Metaphor of "Diver's Reflux" was used to discuss how we can prepare for stressors  Participation Level:  Active  Participation Quality:  Sharing  Affect:  Elevated Mood  Cognitive:  Alert and Less Tangential  Insight: Limited  Engagement in Group:  Engaged  Modes of Intervention:  Discussion, Exploration, Problem-solving and Coping Skills  Additional Comments:  The topic of the group related to the concept of "Diver's Reflux" or finding strategies to manage stressors. The group began with [name] check in. The concept of "Diver's Reflux" was described as when the diver is going to dive in the water the body automatically prepares and adjusts for the change in temperature. Just like [name] diver, we have to prepare for our stressors. The group identified support, visualization, foresight to strategize, and self-talk. We also discussed self-determination and putting as much power in one's hands as possible to control the outcome. Patient actively participated in group by sharing and she presented as less tangential although still will get off topic and presents with elevated energy.   Suicidal/Homicidal: no  Plan: 1.Lisa [name] utilize different strategies to manage stressors.2.Therapist continue to educate patient on effective coping strategies.   [facility] PHP THERAPIST GROUP NOTE  Date: 06/20/15 Time: 12:30 PM-2:00 PM    Group Topic/Focus:  Peer Specialist run group on Dignity of Risk and Personal Responsibility   Participation Level:  Active  Participation Quality:  Attentive and Sharing  Affect:  Elevated Mood  Cognitive:  Oriented, Less Tangential  Insight: Limited  Engagement in Group:  Engaged  Modes of Intervention:  Activity, Discussion, Education, Exploration, Problem-solving and Coping Skills  Additional Comments: The  group was led by peer specialist who discussed the concept of dignity of risk. The concept means that as we try new things we push ourselves outside of our comfort zone. The outcome remains [name] positive one because if we succeed in our undertaking, we have expanded our comfort zone or if we fail then we still gain new information so that no matter what there is [name] possible outcome. The discussion related this to concept of personal responsibility and as we put power in our hands, that risk is [name] necessary step. In discussion of personal responsibility, the group talked about [name] daily maintenance plan. Lisa [name] identified extremes of emotions as something that causes her to stand in her own way. We discussed the concept of acting well to stay well and empowerment as [name] concept that puts the power in her hands. Lisa [name] is more focused and less tangential in discussion though still elevated energy evidenced for example by moving and changing seats during group activity. She presented with good engagement in discussion.   Suicidal/Homicidal: no  Plan: 1.Lisa [name] use insight from group discussion to help her be more comfortable in attempting something new to facilitate change.2.Patient use concept of personal responsibility to help her in making positive changes.    Diagnosis: Primary Diagnosis: Schizoaffective disorder, bipolar type [F25.0]    1. Schizoaffective disorder, bipolar type       Lisa [name] 06/20/2015

## 2015-06-20 NOTE — Progress Notes (Signed)
Date: 06/20/15 Time of encounter: 11:30 AM - 2:00 PM  Purpose: To address negative thought and self-talk through strategizing and healthy risk-taking. Intervention: Assessed personal strategies toward recovery through properly approaching stressors by using ideas of self-determination. Continued placing recovery "in our own hands" by weighing current comfort zones with taking healthy risks in order to facilitate growth ("dignity of risk"). Added that evidence of positive present in our environment is key to positive thinking. Effect: Lisa [name] reflected self-determination as key to visualizing positive outcomes, adding: "If I respect others, I will respect myself." She stated that empowerment arises from personal responsibility through the practice of healthy rituals in her daily life.  Lisa [name] CPSS

## 2015-06-21 ENCOUNTER — Ambulatory Visit (HOSPITAL_COMMUNITY): Payer: Self-pay | Admitting: Licensed Clinical Social Worker

## 2015-06-21 ENCOUNTER — Ambulatory Visit (HOSPITAL_COMMUNITY): Payer: BLUE CROSS/BLUE SHIELD | Admitting: Psychiatry

## 2015-06-21 ENCOUNTER — Other Ambulatory Visit (HOSPITAL_COMMUNITY): Payer: Self-pay

## 2015-06-21 DIAGNOSIS — F250 Schizoaffective disorder, bipolar type: Secondary | ICD-10-CM

## 2015-06-21 DIAGNOSIS — F4310 Post-traumatic stress disorder, unspecified: Secondary | ICD-10-CM

## 2015-06-21 DIAGNOSIS — F200 Paranoid schizophrenia: Secondary | ICD-10-CM

## 2015-06-21 DIAGNOSIS — F319 Bipolar disorder, unspecified: Secondary | ICD-10-CM | POA: Diagnosis not present

## 2015-06-21 DIAGNOSIS — F29 Unspecified psychosis not due to a substance or known physiological condition: Secondary | ICD-10-CM

## 2015-06-21 DIAGNOSIS — F3160 Bipolar disorder, current episode mixed, unspecified: Secondary | ICD-10-CM

## 2015-06-21 DIAGNOSIS — F323 Major depressive disorder, single episode, severe with psychotic features: Secondary | ICD-10-CM

## 2015-06-21 DIAGNOSIS — F22 Delusional disorders: Secondary | ICD-10-CM

## 2015-06-21 NOTE — Psych (Signed)
   Lisa [name] [id number]   Date: 06/21/15  Time: 11:00 AM-12:00 PM  Group Topic/Focus:  Making Healthy Choices in Early Recovery  Participation Level:  Active  Participation Quality:  Sharing  Affect:  Mood elevated  Cognitive:  Alert and at times tangential  Insight: Limited  Engagement in Group:  Engaged and Off Topic  Modes of Intervention:  Discussion, Exploration, Problem-solving, Support and Coping Skills  Additional Comments: Group topic related to making healthy decisions in early recovery. Group started with a check in. Patient related that she had a bad panic attack yesterday while running. Therapist discussed how the origin of panic is fight or flight but that we misinterpret a situation as dangerous.  Therapist explained that panic may be related to an emotional stimulus and we explored any underlying causes that may be the source of the panic.  Patient has considered going back to work, but shows more insight in recognizing that [facility] and that she needs time to stabilize and get healthy before she goes back to work. She also reviewed career paths and thinking about changing the course of study she wants to follow. She is excited about figuring out what she finally wants to study. Therapist cautioned her on not being impulsive but the value of making thought out decisions and also taking the time she needs in PHP to stabilize and feel better.  Suicidal/Homicidal: no  Plan: 1.Patient take steps to develop a healthy lifestyle and build a foundation for their recovery.2.Therapist provide education about coping skills and insight to building a healthy lifestyle.      [facility]  Date: 06/21/15   Time: 12:30 PM-1:30 PM  Group Topic/Focus:  Peer Specialist Run Group-Triggers  Participation Level:  Active  Participation Quality:  Redirectable and Sharing  Affect:  Excited  Cognitive:  Alert and  Tangential at times  Insight: Limited  Engagement in Group:  Distracting, Engaged and Off Topic  Modes of Intervention:  Activity, Discussion, Education, Exploration, Limit-setting, Problem-solving and Coping  Additional Comments: Therapist observed and contributed to group discussion while peer specialist presented a group on triggers. The group discussed how triggers are the stimulus which causes symptoms. The group members identified their triggers and the underlying emotion connected to the trigger. Patients identified reasons why they thought their emotions prevail over logic and reason. The patients then identified any possible healthy solution that would help them have better management of emotions. Lisa [name] related that reliving trauma is a trigger and the underlying emotions are fear and anger. She said that emotions prevail because of lack of control of her emotions. She said that singing helps. This exercise was helpful as patients are learning different places to intervene to manage symptoms and implement healthier coping strategies.    Suicidal/Homicidal: no  Plan: 1.Patient use insight to triggers and healthy strategies to manage emotions to help in management of her symptoms.2.Therapist continue to educate patient on effective coping strategies and skills to manage emotions.     Diagnosis: Primary Diagnosis: Schizoaffective disorder, bipolar type [F25.0]    1. Schizoaffective disorder, bipolar type       Lisa [name] A 06/21/2015

## 2015-06-21 NOTE — Progress Notes (Signed)
Adult Psychoeducational Group Note  Date:  06/21/2015 Time:  4:47 PM  Group Topic/Focus: Coping With Mental Health Crisis:   The purpose of this group is to help patients identify strategies for coping with mental health crisis.  Group discusses possible causes of crisis and ways to manage them effectively. Conflict Resolution:   The focus of this group is to discuss the conflict resolution process and how it may be used upon discharge. Crisis Planning:   The purpose of this group is to help patients create a crisis plan for use upon discharge or in the future, as needed.  Participation Level:  Active  Participation Quality:  Monopolizing and Sharing  Affect:  Anxious, Excited and Not Congruent  Cognitive:  Disorganized  Insight: Lacking  Engagement in Group:  Monopolizing and Off Topic  Modes of Intervention:  Discussion, Education, Exploration and Problem-solving  Additional Comments:  Patients were taught and asked to demonstrate their problem-solving skills. Patients were asked to define a problem clearly, brainstorming multiple solutions, role-playing, listing the pros and cons of each solution, seeking input from others, selecting and implementing a plan of action, and evaluating and readjusting the outcome. Patients were asked to discuss scenarios in which they have been prone to social anxiety. Clients were assisted with exploring the stressors and creating coping skills applicable to the issue. Patients were asked to discuss, in detail, one large obstacle they would like to overcome. Each patient goal was explored, accessed for risk, problem solved and verbalized as a commitment by the patient. Patient was able to display a clear understanding of the use of problem-solving skills as necessary to decrease anxiety symptoms.    Lisa [name] 06/21/2015, 4:47 PM

## 2015-06-21 NOTE — Progress Notes (Signed)
Date: 06/21/15 Time of encounter: 12:30 PM - 1:30 PM  Purpose: To address both root causes and healthy solutions to symptom-triggering events and stimulus.  Intervention: Approached occurrence of triggers (any stimulus that causes exacerbation of symptoms) through a step-by-step process: 1. Identify and isolate the (negatively perceived) stimulus, 2. Identify the core emotion caused by the stimulus, 3. Question why the emotion at hand prevails over logic, and 4. Attempt to enact a positive, healthy solution to the triggering event or stimulus.  Effect: Lisa [name] stated that her "triggers" often involve re-living trauma, playing on her emotions of fear and anger. She added that she is a very emotional individual, and losing control of these emotions often prevails over her logical side. She shared that singing aloud answers her triggers due to the activity being a grounding element, and the control it affords her over her breathing and bodily responses to triggers.  Lisa [name] Lisa [name] CPSS

## 2015-06-21 NOTE — Progress Notes (Signed)
Adult Psychoeducational Group Note  Date:  06/21/2015 Time:  4:45 PM  Group Topic/Focus: Building Self Esteem:   The Focus of this group is helping patients become aware of the effects of self-esteem on their lives, the things they and others do that enhance or undermine their self-esteem, seeing the relationship between their level of self-esteem and the choices they make and learning ways to enhance self-esteem. Coping With Mental Health Crisis:   The purpose of this group is to help patients identify strategies for coping with mental health crisis.  Group discusses possible causes of crisis and ways to manage them effectively.  Participation Level:  Active  Participation Quality:  Monopolizing and Sharing  Affect:  Anxious, Excited and Not Congruent  Cognitive:  Disorganized  Insight: Lacking  Engagement in Group:  Engaged, Monopolizing and Off Topic  Modes of Intervention:  Activity, Discussion, Education, Exploration and Problem-solving  Additional Comments:  Patients were asked about having very negative perception of themselves. Patients were asked to list and discuss positive qualities about themselves as part of the activity "Acknowledge and Accept Positive Qualities" from The Self Esteem Workbook by French AnaGlenn R. Schiraldi, PhD. The activity focuses on acknowledging and reinforcing one's own strengths with appreciation. It was explained to patients that no one does all of these things all of the time therefore no one is perfect or better than anyone else. Each was asked to create a list of 10 positive statements about themselves by incorporating multiple qualities from the populated list. Patients were asked to repeat these "I statements" daily, in a relaxed, comfortable setting for at least 10 minutes. Patients were able to connect comments about themselves and verbalized positive feelings about new beliefs. Patients' self-esteem appeared to have increased as they begin to make statements  affirming their own self-worth.  Lisa [name] 06/21/2015, 4:45 PM

## 2015-06-22 ENCOUNTER — Telehealth (HOSPITAL_COMMUNITY): Payer: Self-pay | Admitting: Licensed Clinical Social Worker

## 2015-06-22 ENCOUNTER — Other Ambulatory Visit (HOSPITAL_COMMUNITY): Payer: Self-pay

## 2015-06-22 NOTE — Progress Notes (Addendum)
    Group Progress Note  Program: PHP  06/21/15 Group Time: 1.35pm-2.20pm  Participation Level: Active  Behavioral Response: Appropriate and Sharing  Type of Therapy:  Yoga Skills Group  Summary of Progress: Counselor provided education re: potential benefits of yoga for emotional health and training relaxation response. led group through grounding mat practice and discussed benefits of poses.  Invited to focus on either breathing, sensations in body or movement throughout practice.  Used image of grounding to earth for support throughout the practice.  Pt shared her previous experience w/ exploring yoga on her own for calming.  Pt reported that the practice was great and felt so relaxed.  Pt was observed to be very talkative  at beginning of practice- slowly through practice and able to remain with silence by end of practice  Lisa RadonLeanne [name], [facility]

## 2015-06-23 ENCOUNTER — Other Ambulatory Visit (HOSPITAL_COMMUNITY): Payer: BLUE CROSS/BLUE SHIELD | Admitting: Licensed Clinical Social Worker

## 2015-06-23 DIAGNOSIS — F250 Schizoaffective disorder, bipolar type: Secondary | ICD-10-CM

## 2015-06-23 DIAGNOSIS — F323 Major depressive disorder, single episode, severe with psychotic features: Secondary | ICD-10-CM

## 2015-06-23 DIAGNOSIS — F319 Bipolar disorder, unspecified: Secondary | ICD-10-CM | POA: Diagnosis not present

## 2015-06-23 DIAGNOSIS — F200 Paranoid schizophrenia: Secondary | ICD-10-CM

## 2015-06-23 DIAGNOSIS — F22 Delusional disorders: Secondary | ICD-10-CM

## 2015-06-23 DIAGNOSIS — F29 Unspecified psychosis not due to a substance or known physiological condition: Secondary | ICD-10-CM

## 2015-06-23 DIAGNOSIS — F3160 Bipolar disorder, current episode mixed, unspecified: Secondary | ICD-10-CM

## 2015-06-23 DIAGNOSIS — F4310 Post-traumatic stress disorder, unspecified: Secondary | ICD-10-CM

## 2015-06-23 NOTE — Progress Notes (Signed)
Date: 06/23/15 Time of encounter: 1:00 PM - 2:00 PM  Purpose: To investigate "confirmation bias" (where we look for evidence to support our current beliefs) and reframing outlook to the positive. Intervention: Addressed occurrence of confirmation bias, stressing that this may lead to less consideration given to alternative, positive possibilities. Spoke of thought processes affected by confirmation bias: Attitude (in disagreements with others), Belief (where a belief is continued even after being disproved), and Conclusion (coming to conclusions where we "look for" links). Added that we have opportunities to come to new, positive beliefs. 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 She gave an example of assuming the negative in this situation, which she identified as an old belief pattern. She reframed her outlook in this situation by using the equation: "support + awareness = prevention". She stressed that support and awareness are two elements of her focus that she currently utilizes in her recovery process.  Lisa [name] CPSS

## 2015-06-23 NOTE — Psych (Signed)
   [facility] PHP THERAPIST PROGRESS NOTE  Lisa [name] [id number]   Date: 06/23/15 Time: 11:00 AM-12:30 PM   Group Topic/Focus:  Discussion of stressors and effective coping strategies  Participation Level:  Active  Participation Quality:  Distracted, off topic  Affect:  Excited and Elevated energy  Cognitive:  Tangential  Insight: Limited  Engagement in Group:  Distracting, Engaged and Off Topic  Modes of Intervention:  Discussion, Education, Exploration, Problem-solving and Coping Skills  Additional Comments: Group topic related to stressors that patients are currently facing and helping them to gain insight about effective coping strategies to address them. Group started with a check in. Group discussion focused on positive self-determination which meant that when we are caught up in the negative to focus on the positive, pick a goal and steer yourself toward it for a positive outcome. Another topic was the helpfulness of broadening one's support group. We discussed how each of [place] has a different perspective and that since we are complex, we gain better insight when get different points of view to understand ourselves better. Patient presents with what appears to by manic type symptoms such as elevated mood, distraction, tangential, off topic so even though attempting to be engaged she is not processing information effectively in order to benefit. My assessment is that medication needs to be reviewed. She related that she learned unexpectedly that a mentor died yesterday. It may be that manic symptoms are helping her to cope.    Suicidal/Homicidal: no  Plan: 1.Patient see doctor for medication management.2.Therapist continue to educate patient on effective coping strategies.   [facility] PHP THERAPIST GROUP NOTE  Date: 06/23/15 Time: 1:00 PM-2:00 PM  Group Topic/Focus:  Peer Specialist run group on Confirmation Bias  Participation Level:  Active  Participation Quality:  Intrusive  and Off topic  Affect:  Excited and Elevated mood  Cognitive:  Off topic, tangential  Insight: Limited  Engagement in Group:  Engaged and Off Topic  Modes of Intervention:  Discussion, Education, Exploration, Problem-solving and Coping Skills  Additional Comments: Group was led by peer specialist on the topic of confirmation bias. This concept was explained as looking for new evidence that supports are current beliefs. This can be unhealthy since it keeps you from growing or supporting unhealthy beliefs. Patients were asked to identify unhealthy beliefs and then to reframe to look for evidence to support positive beliers. This application of principles is an opportunity to come to positive conclusion in life. We can sink into the negative but we can be uplifted by the positive by looking for the evidence of the positive. Patients were asked to look for evidence from something that happened in group today to support the belief that "I am going to get better" or I will be an example of wellness. Patient identified supports from group as an indication she is getting better. Patient remains off topic and tangential in thinking so she is getting limited benefit from group.  Suicidal/Homicidal: no  Plan:1.Patient utilize confirmation bias to reframe unhealthy beliefs and look for evidence to support positive beliefs.2.Therapist continue to educate patient on healthy coping strategies.    Diagnosis: Primary Diagnosis: Schizoaffective disorder, bipolar type [F25.0]    1. Schizoaffective disorder, bipolar type       [name] A 06/23/2015

## 2015-06-24 ENCOUNTER — Encounter (HOSPITAL_COMMUNITY): Payer: Self-pay | Admitting: Licensed Clinical Social Worker

## 2015-06-24 ENCOUNTER — Other Ambulatory Visit (HOSPITAL_COMMUNITY): Payer: BLUE CROSS/BLUE SHIELD | Admitting: Licensed Clinical Social Worker

## 2015-06-24 ENCOUNTER — Ambulatory Visit (HOSPITAL_COMMUNITY): Payer: Self-pay | Admitting: Licensed Clinical Social Worker

## 2015-06-24 DIAGNOSIS — F250 Schizoaffective disorder, bipolar type: Secondary | ICD-10-CM

## 2015-06-24 DIAGNOSIS — F29 Unspecified psychosis not due to a substance or known physiological condition: Secondary | ICD-10-CM

## 2015-06-24 DIAGNOSIS — F22 Delusional disorders: Secondary | ICD-10-CM

## 2015-06-24 DIAGNOSIS — F3160 Bipolar disorder, current episode mixed, unspecified: Secondary | ICD-10-CM

## 2015-06-24 DIAGNOSIS — F323 Major depressive disorder, single episode, severe with psychotic features: Secondary | ICD-10-CM

## 2015-06-24 DIAGNOSIS — F200 Paranoid schizophrenia: Secondary | ICD-10-CM

## 2015-06-24 DIAGNOSIS — F4310 Post-traumatic stress disorder, unspecified: Secondary | ICD-10-CM

## 2015-06-24 DIAGNOSIS — F319 Bipolar disorder, unspecified: Secondary | ICD-10-CM | POA: Diagnosis not present

## 2015-06-24 NOTE — Progress Notes (Signed)
Adult Psychoeducational Group Note  Date:  06/23/15 Time:  1:38 PM  Group Topic/Focus: Building Self Esteem:   The Focus of this group is helping patients become aware of the effects of self-esteem on their lives, the things they and others do that enhance or undermine their self-esteem, seeing the relationship between their level of self-esteem and the choices they make and learning ways to enhance self-esteem. Self Esteem Action Plan:   The focus of this group is to help patients create a plan to continue to build self-esteem after discharge.  Participation Level:  Active  Participation Quality:  Intrusive, Inattentive, Redirectable and Sharing  Affect:  Excited and Not Congruent  Cognitive:  Disorganized, Delusional and Lacking  Insight: Limited  Engagement in Group:  Engaged, Monopolizing and Off Topic  Modes of Intervention:  Activity, Discussion, Education, Exploration and Role-play  Additional Comments:  Group members were shown a  4 minute clip about a self-esteem related topic. Each member was asked to identify a celebrity or business which has stated a claim that everyone has come to believe. Members were asked to recall statements they'd made about themselves that may have currently shaped or defined their attitudes or thinking patterns. Each member was challenged to create one statement about themselves that they will repeat often and convince others to believe about themselves as well.  Therapist gave feedback and reinforced positive statements shared by each member.  Group members were assigned the task of creating an "About Me" booklet.  Members were to enter fun facts about themselves. Each member was instructed to write a positive comment on each of the other member's booklets. Comments and fun facts were read aloud and discussed. The purpose of this exercise was to facilitate socialization skills and increase self-awareness and build self-esteem.   Lucette Kratz 06/23/15,  1:38 PM

## 2015-06-24 NOTE — Progress Notes (Signed)
Adult Psychoeducational Group Note  Date:  06/24/2015 Time:  5:00 PM  Group Topic/Focus: Cognitive Distortions- This group deals with negative thinking patterns   Participation Level:  Active  Participation Quality:  Intrusive, Monopolizing, Redirectable and Vociferous; Vacillating between happy and sullen  Affect:  Anxious and HyperPerplexed  Cognitive:  Disorganized and Illogical; Reality Testing; Delusional  Insight: Lacking  Engagement in Group:  Defensive, Engaged, Monopolizing and Off Topic  Modes of Intervention:  Activity, Discussion, Education and Exploration  Additional Comments:  Patients were assisted in developing better coping skills and building confidence through their ability to manage stress related incidents. Cognitive Behavioral Therapy techniques were used to help patients identify, explore and develop a plan to change distorted and/or negative thought patterns and behaviors. Patients were asked to identify thoughts, feelings and triggers related to past and perceived future occurrences. Therapist aided patients in developing reframe statements to correct negative, fatalistic thought patterns. Patients were assisted with exploring worries, fears and ritualistic directed behaviors.  Therapist continually assessed for risk patterns associated with today's discussion topics. Therapist discussed and reviewed limits and exposure associated with fear triggers. Feedback was continually provided to assist patients with continuing to explore, restate and create new coping skills to address self-defeating thought configurations.   Khamiya is prone to flights of grandiose thinking. Her conversations are tangential and present with poverty of thought. She appears to make comparisons and statements which demonstrate a sense of disorganized thinking. She often monopolizes group discussions and becomes irritated when delusional thought patterns are reality tested and challenged for factual  evidence. Idalys ruminates on grief and traumatic thoughts almost as if to "relish in the memory of feeling melancholy."  She appears to spend much of the group discussion attention seeking by disclosing grandiose or delusional scenarios as if to exaggerate its/her importance to other group members.She then becomes irritated and sullen when she does not receive the desired responses.  Taylore indicates she is enjoying the group however therapist is unsure of how much she is cognitively able to process as most days she presents as hypomanic. She appears at times to exhibit a form of anosognosia as well as auditory hallucinations (noise on the ceiling in group group is a patient in inpatient stomping and stating "Hallelujah"). She became irritable after being redirected several times when she announced wanting to visit the patient on the inpatient wing and was informed that she would have to contact inpatient to learn the procedure. Therapist questions whether she is adhering to medication regimen and will discuss the concern with Clarita on Monday.   Rayan Dyal 06/24/2015, 5:00 PM

## 2015-06-24 NOTE — Progress Notes (Addendum)
06/21/15 Initial Psychiatric Assessment / Admit Note to PHP. Duration - 45 minutes   CC- " I have flashbacks about being in the [place] "   HPI- 24 year old  Single female, college student . Patient reports she had recent psychiatric admission for " being delusional".  States she has had episodes of psychosis since she traveled to the [place] in [place]. At the time felt her psychiatric symptoms stemmed from " the dirty water over there ".  She states that at this time she is feeling better, " more like myself ". In reviewing chart, she has been diagnosed with Bipolar Disorder, and has had two recent psychiatric medications due to mania/psychosis, most recently from 7 /6 through 06/15/15.  At the time she presented with delusions that someone wanted to kill her family.  A Prior admission  Earlier this year had been related to manic symptoms such as increased aggressive /reckless behaviors, delusions,  decreased sleep, delusions. She improved with Abilify, Seroquel, Lamictal, Vistaril regimen States that she is doing well at present , and currently denies psychotic symptoms and does not express any delusions or present internally preoccupied. She denies depression and currently denies any significant neuro-vegetative symptoms- mood is "OK", sleep, appetite, energy level, described as normal. Denies any anhedonia or any suicidal ideations at present .  Past Psych. History- As Noted, has been diagnosed with Bipolar Disorder, most recently manic. Decompensations course with psychosis. She states her first episode occurred in 2014 in the context of going to the [place] . She has had three prior psychiatric admissions. Denies history of suicide attempts .She follows up with Dr. Jannifer Franklin at [facility] for outpatient psychiatric management.   Substance Abuse History - History of Cannabis Dependence, but denies recent use.  (+) Cannabis abuse during her trip to [place] in 2014.  Denies alcohol or other drug abuse .  Medical History- Denies any medical illnesses ,Does not smoke . Denies pregnancy. No surgeries, except for molar extraction.   Allergies : Allergic to [facility]- we reviewed- reports EPS from this medication.    ROS: denies headache, denies ocular issues, denies chest pain, denies shortness of breath, denies nausea,  Denies vomiting, denies diarrhea, denies abdominal pain, denies dysuria, denies urgency, denies rash, denies fever, no chills.   Social History- Single, no children, lives  with parents , works at a [employer], in college, studying to become a SW. No legal issues .   Family History-  She lives with her parents, states she has good relationship with them.   Has two sisters, states mother has had brief psychotic episodes in the past. Denies alcohol or drug abuse in family. Denies suicides in family.  Current Medications:( as reported by patient) - patient states she has been compliant with medications and denies current side effects .  Abilify 5 mgrs BID Seroquel 100 mgrs QHS Lamictal 25 mgrs QDAY  Vistaril PRNs for Anxiety ( dose?)  MSE : Alert and attentive, well related, polite,  No psychomotor restlessness or agitation, speech normal , not pressured  ,  No involuntary muscle movements or akathisia noted. She describes Mood is " pretty good"   And denies any depression or sadness . Affect reactive , bright, but not overly expansive,  no thought disorder- linear ,no flight of ideations noted, denies any suicidal ideations, denies any homicidal ideations,  Denies  Hallucinations and does not appear internally preoccupied at present, no delusions noted, no grandiose ideations. 0x3. Has insight into her illness and states "  I know I have a mental illness and that I need to take my medication"  Assessment- 24year old single female, who has A history of  Bipolar Disorder, with recent  Manic decompensation and a psychiatric  admission in early  July for delusions . Has history of  Mood/psychotic episodes since 2014. At this time she is euthymic, denying any depression and not presenting with any overt manic symptoms. She denies any current psychotic symptoms and does not express any delusions at this time /does not appear internally preoccupied. She states she feels stable on her current medications - see above .    Dx.  Bipolar Disorder, most recent episode Manic. Cannabis Dependence, in remission   Plan- Admit to PHP- this level of care warranted to minimize risk of further worsening and  Minimize risk of psychiatric readmission.  For now Continue medications as highlighted above . Does not need scripts from me at this time. Will,if Patient provides  express consent, discuss case with her outpatient Psychiatrist, Dr. Jannifer Franklin. Nehemiah Massed, MD

## 2015-06-24 NOTE — Addendum Note (Signed)
Addended by: Nehemiah Massed A on: 06/24/2015 07:04 PM   Modules accepted: Orders, SmartSet

## 2015-06-24 NOTE — Psych (Signed)
   [facility] PHP THERAPIST PROGRESS NOTE  Lisa [name] [id number]   Date: 06/24/15 Time:  11:00 AM-12:00 PM  Group Topic/Focus:  Anger Management  Participation Level:  Active  Participation Quality:  Redirectable, Sharing and tangential at times but improving in focus  Affect:  Excited and mood elevated but improving  Cognitive:  Alert and Tangential at times  Insight: Limited  Engagement in Group:  Engaged  Modes of Intervention:  Discussion, Education, Exploration, Problem-solving and Coping Skills  Additional Comments: Group topic related to anger management. Group started with check in. Group members brought up strategies they have used to manage anger that were identified as unhealthy including avoidance and escape. Therapist pointed out that these strategies can cause the problem to be worse. These type of strategies can have short-term pay offs but they come at a price. You can hold on to resentments and group members recognize that they don't feel good about the encounter afterwards. The result is that you don't feel at peace with yourself and the world. Therapist discussed alternative strategies and that there is a reward for using healthier strategies. You can be at peace with yourself and how you communicate with yourself impacts how you communicate with others. It helps to have a safe place where you can de-escalate and then use healthier strategies. [name] checked in by saying she is feeling good and actively searching for work. She actively participated by sharing appropriately and sharing respectfully.   Suicidal/Homicidal: no  Plan:1.[name] gain more insight and motivation to apply healthy coping strategies for her anger.2.Therapist continue to educate patient on healthy coping strategies.  [facility] PHP THERAPIST GROUP NOTE  Date: 06/24/15   Time: 12:30 PM-1:30 PM    Group Topic/Focus:  Infroduction to Acceptance and Committment Therapy  Participation Level:   Active  Participation Quality:  Redirectable and Sharing  Affect:  Excited and Elevated mood but improving  Cognitive:  Tangential at times  Insight: Limited  Engagement in Group:  Engaged and Supportive  Modes of Intervention:  Activity, Discussion, Education, Exploration and Coping Skills  Additional Comments: Therapist introduced ACT. Therapist discussed the core components that include acceptance of a negative thought, then defused through a variety of techniques which may include mindfulness, metaphors and language. Therapist introduced concept of mindfulness and used the concept of sitting by the edge of the river, removed from the tumult of your emotions and at the same time be a Academic librarian of your emotions. Patients learned that mindfulness is paying attention in a particular way: on purpose, in the present moment, and non-judgementally. Patient was engaged and actively participated in activity.   Suicidal/Homicidal: no  Plan: 1.Patient gain insight to healthy coping strategies and implement ones that are effective for her.2.Therapist continue to educate patient on effective coping strategies.   Diagnosis: Primary Diagnosis: Schizoaffective disorder, bipolar type [F25.0]    1. Schizoaffective disorder, bipolar type       [name] 06/24/2015

## 2015-06-27 ENCOUNTER — Other Ambulatory Visit (HOSPITAL_COMMUNITY): Payer: BLUE CROSS/BLUE SHIELD | Admitting: Licensed Clinical Social Worker

## 2015-06-27 ENCOUNTER — Encounter (HOSPITAL_COMMUNITY): Payer: Self-pay

## 2015-06-27 DIAGNOSIS — F22 Delusional disorders: Secondary | ICD-10-CM

## 2015-06-27 DIAGNOSIS — F323 Major depressive disorder, single episode, severe with psychotic features: Secondary | ICD-10-CM

## 2015-06-27 DIAGNOSIS — F250 Schizoaffective disorder, bipolar type: Secondary | ICD-10-CM

## 2015-06-27 DIAGNOSIS — F29 Unspecified psychosis not due to a substance or known physiological condition: Secondary | ICD-10-CM

## 2015-06-27 DIAGNOSIS — F4310 Post-traumatic stress disorder, unspecified: Secondary | ICD-10-CM

## 2015-06-27 DIAGNOSIS — F200 Paranoid schizophrenia: Secondary | ICD-10-CM

## 2015-06-27 DIAGNOSIS — F3160 Bipolar disorder, current episode mixed, unspecified: Secondary | ICD-10-CM

## 2015-06-27 NOTE — Progress Notes (Signed)
Date: 06/27/15 Time of encounter: 2:00 PM - 3:00 PM  Purpose: To address realistic optimism as present in recovery. Intervention: Discussed perceived control over events affecting recovery. Affirmed that belief is necessary to accomplish goals effectively. Effect: Dakisha identified herself as possessing a balance between internal (personal/self) and external (environmental) locus of control when approaching her recovery.  Donnamarie Rossetti CPSS

## 2015-06-27 NOTE — Progress Notes (Signed)
Adult Psychoeducational Group Note  Date:  06/27/2015 Time:  1:36 PM  Group Topic/Focus: Diagnosis Education:   The focus of this group is to discuss the major disorders that patients maybe diagnosed with.  Group discusses the importance of knowing what one's diagnosis is so that one can understand treatment and better advocate for oneself. Dimensions of Wellness:   The focus of this group is to introduce the topic of wellness and discuss the role each dimension of wellness plays in total health. Medication Education: The focus of this group is to discuss medications, side effects, and management.  Participation Level:  Active  Participation Quality:  Intrusive, Redirectable and Agressive; Emotionally Reactive  Affect:  Anxious, Excited and Hyper  Cognitive:  Disorganized, Confused, Delusional and Conspiracy Theories  Insight: Lacking  Engagement in Group:  Defensive, Monopolizing, Off Topic and Disruptive  Modes of Intervention:  Activity, Discussion and Education  Additional Comments:  Pharmacist met with the group to discuss medication education and compliance. Pharmacist began to discuss various medications and their role in wellness. Kalandra began to ask questions about Lithium and as Alana began to respond and answer the questions Avyonna began to escalate. Charyl's voice began to escalate as she asked questions. She appeared to pace the room as she asked her questions. She walked over to the dry erase board and wrote the chemical makeup of lithium on the board. She walked away and erased the board several times and rewrote the formula on the board again each time. She then sat down, stood immediately and yelled (as Alana was talking) " I can't listen to this anymore" and walked out of the group room and out of the building. Therapist was concerned for her safety and located her returning back into the hallway outside of the group room. She states that she felt the pharmacist did not know  what she was talking about and that she feels she was "out to get her" because "I have been diagnosed wrong so many times, I know those are delusions but I still think she is promoting the medication for the drug companies. " She displayed a series of grandiose thinking as the therapist attempted to de-escalate her she yelled "This is my stuff man! This is all on me! I've been diagnosed wrong so many time I can sue the hospital. I can sue the doctors! I can do any of that but I'm not. I just don't want her to tell me that its not lithium bicarbonate when I know it is. 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 Lesha reported that she is taking her medications daily and as prescribed. She offered that her parents would be able to attest to this. Therapist discussed her behavior in group and assisted with processing the behavior so that she will be able to return to group. Lee-Ann acknowledged her disruptive behavior and offered to apologize to the pharmacist.   Ariday Brinker 06/27/2015, 1:36 PM             Adult Psychoeducational Group Note  Date:  06/27/2015 Time:  5:24 PM  Group Topic/Focus: Healthy Communication:   The focus of this group is to discuss communication, barriers to communication, as well as healthy ways to communicate with others. Identifying Needs:   The focus of this group is to help patients identify their personal needs that have been historically problematic and identify healthy behaviors to  address their needs.  Participation Level:  Active  Participation Quality:  Monopolizing, Redirectable and Sharing  Affect:  Anxious and Excited  Cognitive:  Disorganized and Delusional  Insight: Lacking  Engagement in Group:  Monopolizing, Off Topic and Poor  Modes of Intervention:  Activity, Discussion, Education, Exploration and Support  Additional Comments:   Therapist  utilized an Chartered loss adjuster to identify the social and personal relationships of each group member within his or her own environment. Ecomaps document the connections between family members and the outside world as well as provide a way to visualize the quality of those connections either as positive and nurturing or as negative, a source of stress and/or conflictual. Connections are considered strong or weak. Ecomaps are used to assist group members with discovering possible sources of depression and anxiety as well as uncovering hidden support systems in friends, neighbors, clubs, professional agencies, charities, and social or religious organizations. Each group member was asked to list connections in which they considered a source of support. Therapist accessed for risk of increased depression due to lack of supports and discussed possible resources in which to increase support systems. Therapist increased awareness of the need for a strong support system along the process of rediscovering wellness.     Nathania continues to present with a very disorganized thought pattern. She continues to express conspiracy theories regarding "people out to get me", she continues to express grandiose and delusional thoughts and ideas that often transcribe into a sort of "word salad" of thoughts. She often begins to express a thought and appears confused by the relevancy or nature of the point she is/was attempting to make. Often when this occurs, she will begin singing or pacing the room.  She often disrupts group with her thoughts and over dramatic mannerisms. Therapist attempted several times to redirect her when she attempted to contradict the therapist's accessed risk of another patient's depressive symptoms based upon something she'd witnessed on a television show. Therapist is still unsure as to whether she is medication complaint and has mentioned this to both the psychiatrist and nurse.         Adult Psychoeducational  Group Note  Date:  06/27/2015 Time:  5:37 PM  Group Topic/Focus: Emotional Education:   The focus of this group is to discuss what feelings/emotions are, and how they are experienced. Spirituality:   The focus of this group is to discuss how one's spirituality can aide in recovery.  Participation Level:  Minimal  Participation Quality:  Intrusive, Monopolizing, Redirectable and Sharing  Affect:  Anxious, Defensive, Excited and Not Congruent  Cognitive:  Disorganized and Lacking  Insight: Lacking  Engagement in Group:  Engaged, Monopolizing, Off Topic and Supportive  Modes of Intervention:  Activity, Discussion, Education and Support  Additional Comments:   Therapist taught group members that in bereavement there are five stages of grief: Denial, Anger, Bargaining, Depression and Acceptance.   Therapist explained that each person spends different lengths of time working through each step and may express each stage with different levels of intensity.  Therapist discussed with the group that the five stages do not necessarily occur in any specific order and that each person may cycle back and forth throughout the stages. Therapist explained that there is no time limit on grief and that each stage may not necessarily be "finished" but "set aside" until acceptance has been reached.  Patients were asked to discuss their experiences with grief whether it be the loss of a loved one; loss of  a pet, relationship loss, job loss, etc. Therapist actively listened and provided support as patients reflected on their understandings and experiences with the process of grief.  Jessicalynn Deshong 06/27/2015, 5:37 PM Yosmar Ryker 06/27/2015, 5:24 PM

## 2015-06-28 ENCOUNTER — Ambulatory Visit (HOSPITAL_COMMUNITY): Payer: BLUE CROSS/BLUE SHIELD | Admitting: Psychiatry

## 2015-06-28 DIAGNOSIS — F319 Bipolar disorder, unspecified: Secondary | ICD-10-CM | POA: Diagnosis not present

## 2015-06-29 ENCOUNTER — Ambulatory Visit (HOSPITAL_COMMUNITY): Payer: Self-pay | Admitting: Psychiatry

## 2015-06-29 NOTE — Progress Notes (Signed)
   Partial Hospitalization Program Type of Therapy:  Group Therapy  06/28/2015   12:30 to 1:30 PM Participation Level:  Did Not Attend Summary of Progress/Problems: Summary of Progress/Problems: The main focus of today's process group was discussion and identification of cognitive distortions.  Motivational Interviewing was utilized to ask the group members what they get out of their cognitive distortions, and what reasons they may have for wanting to change. Several examples of cognitive distortions were explained using a handout, and patients identified where they currently are with thought distortions and where and they might be willing to challenge their own distortions.  Julious Payer Wadsworth, LCSW 06/27/14 at 4:25 PM

## 2015-06-29 NOTE — Progress Notes (Signed)
   06/29/2015   12:30 to 4:00 PM Participation Level:  Did Not Attend Summary of Progress/Problems:  The main focus of today's process group was discussion stressors and triggers for depression and anxiety.  Cognitive restructuring and reframing were used as methods to constructively think about sensory triggers and changes in the body that occur because of stress. Patients were encouraged to think of physical changes in the body due to stress as "my body helping me to meet a challenge". Patients watched and discussed Tresa Endo McGonigal video about transforming stress into a positive experience that encouraged resilience and social connection. Patients participated in the "six simple words exercise" to help develop self-identify and approach recovery process positively.   Shaune Pollack, LPC

## 2015-06-30 ENCOUNTER — Ambulatory Visit (HOSPITAL_COMMUNITY): Payer: Self-pay | Admitting: Psychiatry

## 2015-06-30 NOTE — Progress Notes (Signed)
Discharge Note  Patient:  Lisa [name] is an 24 y.o., female DOB:  Feb 10, 1991  Date of Admission:  06/21/2015 Date of Discharge:  06/30/2015  Reason for Admission depression  Partial Hospital course:  [name] attended several sessions and called saying she was okay and no longer planned to attend the program.  Mental Status at Discharge:did not return and requesting discharge  Lab Results: No results found for this or any previous visit (from the past 48 hour(s)).   Current outpatient prescriptions:  .  ARIPiprazole (ABILIFY) 10 MG tablet, Take 1 tablet (10 mg total) by mouth 2 (two) times daily., Disp: 60 tablet, Rfl: 0 .  hydrOXYzine (ATARAX/VISTARIL) 50 MG tablet, Take 1 tablet (50 mg total) by mouth every 6 (six) hours as needed for anxiety., Disp: 30 tablet, Rfl: 0 .  lamoTRIgine (LAMICTAL) 25 MG tablet, Take 1 tablet (25 mg total) by mouth daily., Disp: 30 tablet, Rfl: 0 .  QUEtiapine (SEROQUEL) 100 MG tablet, Take 1 tablet (100 mg total) by mouth at bedtime., Disp: 30 tablet, Rfl: 0  Axis Diagnosis:  Bipolar disorder 1, most recent episode depressed   Level of Care:  PHP  Discharge destination:  Other:  staff will contact her to make sure she has follow up appointments  Is patient on multiple antipsychotic therapies at discharge:  No    Has Patient had three or more failed trials of antipsychotic monotherapy by history:  Negative  Patient phone:  [phone] (home)  Patient address:   [address] [facility] [place] [address],   Follow-up recommendations:  Activity:  continue current activity Diet:  continue current diet  Comments:  none  The patient received suicide prevention pamphlet:  did not return Belongings returned:  na  Carolanne Grumbling 06/30/2015, 2:49 PM

## 2015-07-01 ENCOUNTER — Ambulatory Visit (HOSPITAL_COMMUNITY): Payer: Self-pay

## 2015-07-01 ENCOUNTER — Telehealth (HOSPITAL_COMMUNITY): Payer: Self-pay

## 2015-07-01 NOTE — Progress Notes (Signed)
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  Patient reports she was hospitalized due to " being delusional." States she has had episodes of psychosis since she traveled to the [place] in [place]. At the time felt her psychiatric symptoms stemmed from " the dirty water over there."  Diagnosed with Bipolar Disorder, and has had two recent psychiatric medications due to mania/psychosis, most recently from 7 /6 through 06/15/15. At the time she presented with delusions that someone wanted to kill her family. A Prior admission earlier this year had been related to manic symptoms such as increased aggressive /reckless behaviors, delusions, decreased sleep, delusions. During admission in PHP, pt stated that she was doing well and denied psychotic symptoms and also didn't express any delusions.  She denied depression and reported that her mood is "OK", sleep, appetite, energy level, described as normal. Denied any anhedonia or any suicidal ideations. Pt apparently attended for a couple of days and called in the other day and left vm that she wouldn't be returning.  Placed call to pt yesterday, as request from Dr. Ladona Ridgel.  According to pt, "she has gotten all she needed from partial and wouldn't be returning."  Denied SI/HI or A/V hallucinations.  Reports she had just left from Dr. Gloris Manchester office.  "He wasn't available, but I left him a message."  States her scheduled appt was next month (07-25-15).  A:  D/C pt from PHP.  F/U with Dr. Jannifer Franklin today at 4:15 pm, according to secretary at his office.  Encouraged pt to discuss referral to a therapist with Dr. Jannifer Franklin; since she declined writer to refer her to someone.  R:  Pt receptive.

## 2015-07-01 NOTE — Telephone Encounter (Signed)
Telephone call to patient's home to follow up on patient's report to Donnamarie Rossetti, peer specialist she was not going to return to the Partial Hospitalization Program.  Requested patient's Mother have patient call back in the coming week to speak to [facility] staff or Dr. Ladona Ridgel on Tuesday to discuss discharging her from the program as patient did not return this week.  Ms. Boulay agreed to give patient the message and to have her follow up in the coming week to make sure she is set up for further services with a treating psychiatrist.

## 2015-07-04 ENCOUNTER — Other Ambulatory Visit (HOSPITAL_COMMUNITY): Payer: Self-pay

## 2015-07-05 ENCOUNTER — Other Ambulatory Visit (HOSPITAL_COMMUNITY): Payer: Self-pay

## 2015-07-06 ENCOUNTER — Other Ambulatory Visit (HOSPITAL_COMMUNITY): Payer: Self-pay

## 2015-07-07 ENCOUNTER — Telehealth (HOSPITAL_COMMUNITY): Payer: Self-pay | Admitting: Licensed Clinical Social Worker

## 2015-07-07 ENCOUNTER — Other Ambulatory Visit (HOSPITAL_COMMUNITY): Payer: Self-pay

## 2015-07-08 ENCOUNTER — Other Ambulatory Visit (HOSPITAL_COMMUNITY): Payer: Self-pay

## 2015-07-11 ENCOUNTER — Other Ambulatory Visit (HOSPITAL_COMMUNITY): Payer: Self-pay

## 2015-07-12 ENCOUNTER — Other Ambulatory Visit (HOSPITAL_COMMUNITY): Payer: Self-pay

## 2015-07-13 ENCOUNTER — Other Ambulatory Visit (HOSPITAL_COMMUNITY): Payer: Self-pay

## 2015-07-14 ENCOUNTER — Other Ambulatory Visit (HOSPITAL_COMMUNITY): Payer: Self-pay

## 2015-07-15 ENCOUNTER — Other Ambulatory Visit (HOSPITAL_COMMUNITY): Payer: Self-pay

## 2015-07-18 ENCOUNTER — Other Ambulatory Visit (HOSPITAL_COMMUNITY): Payer: Self-pay

## 2015-07-19 ENCOUNTER — Telehealth (HOSPITAL_COMMUNITY): Payer: Self-pay | Admitting: Licensed Clinical Social Worker

## 2015-07-19 ENCOUNTER — Other Ambulatory Visit (HOSPITAL_COMMUNITY): Payer: Self-pay

## 2015-07-19 NOTE — Telephone Encounter (Signed)
Therapist touched base with Lisa [name] who followed up with this patient. Patient confirmed with Lisa [name] that she did want to continue PHP and Lisa [name] completed discharge and aftercare plan with her.

## 2015-07-20 ENCOUNTER — Other Ambulatory Visit (HOSPITAL_COMMUNITY): Payer: Self-pay

## 2015-07-21 ENCOUNTER — Other Ambulatory Visit (HOSPITAL_COMMUNITY): Payer: Self-pay

## 2015-07-22 ENCOUNTER — Other Ambulatory Visit (HOSPITAL_COMMUNITY): Payer: Self-pay

## 2015-07-25 ENCOUNTER — Other Ambulatory Visit (HOSPITAL_COMMUNITY): Payer: Self-pay

## 2015-07-26 ENCOUNTER — Other Ambulatory Visit (HOSPITAL_COMMUNITY): Payer: Self-pay

## 2015-07-27 ENCOUNTER — Other Ambulatory Visit (HOSPITAL_COMMUNITY): Payer: Self-pay

## 2015-07-28 ENCOUNTER — Other Ambulatory Visit (HOSPITAL_COMMUNITY): Payer: Self-pay

## 2015-07-29 ENCOUNTER — Other Ambulatory Visit (HOSPITAL_COMMUNITY): Payer: Self-pay

## 2015-08-31 ENCOUNTER — Telehealth (HOSPITAL_COMMUNITY): Payer: Self-pay | Admitting: Licensed Clinical Social Worker

## 2015-09-06 ENCOUNTER — Inpatient Hospital Stay (HOSPITAL_COMMUNITY)
Admission: AD | Admit: 2015-09-06 | Discharge: 2015-09-09 | DRG: 885 | Disposition: A | Discharge status: Home / Self Care | Payer: BLUE CROSS/BLUE SHIELD | Attending: Psychiatry | Admitting: Psychiatry

## 2015-09-06 ENCOUNTER — Other Ambulatory Visit (HOSPITAL_COMMUNITY): Payer: BLUE CROSS/BLUE SHIELD | Attending: Psychiatry | Admitting: Licensed Clinical Social Worker

## 2015-09-06 ENCOUNTER — Encounter (HOSPITAL_COMMUNITY): Payer: Self-pay | Admitting: Licensed Clinical Social Worker

## 2015-09-06 ENCOUNTER — Encounter (HOSPITAL_COMMUNITY): Payer: Self-pay | Admitting: *Deleted

## 2015-09-06 DIAGNOSIS — R45851 Suicidal ideations: Secondary | ICD-10-CM | POA: Diagnosis present

## 2015-09-06 DIAGNOSIS — F3130 Bipolar disorder, current episode depressed, mild or moderate severity, unspecified: Secondary | ICD-10-CM

## 2015-09-06 DIAGNOSIS — F319 Bipolar disorder, unspecified: Secondary | ICD-10-CM | POA: Insufficient documentation

## 2015-09-06 DIAGNOSIS — Z87891 Personal history of nicotine dependence: Secondary | ICD-10-CM | POA: Diagnosis not present

## 2015-09-06 DIAGNOSIS — F1210 Cannabis abuse, uncomplicated: Secondary | ICD-10-CM | POA: Insufficient documentation

## 2015-09-06 DIAGNOSIS — F250 Schizoaffective disorder, bipolar type: Secondary | ICD-10-CM | POA: Diagnosis present

## 2015-09-06 MED ORDER — ARIPIPRAZOLE 15 MG PO TABS
7.5000 mg | ORAL_TABLET | Freq: Two times a day (BID) | ORAL | Status: DC
  Filled 2015-09-06 (×4): qty 1

## 2015-09-06 MED ORDER — MAGNESIUM HYDROXIDE 400 MG/5ML PO SUSP: 30.0000 mL | Freq: Every day | ORAL | Status: DC | PRN

## 2015-09-06 MED ORDER — QUETIAPINE FUMARATE 50 MG PO TABS
150.0000 mg | ORAL_TABLET | Freq: Every day | ORAL | Status: DC
  Administered 2015-09-06: 150 mg via ORAL
  Filled 2015-09-06 (×4): qty 1

## 2015-09-06 MED ORDER — ACETAMINOPHEN 325 MG PO TABS
650.0000 mg | ORAL_TABLET | Freq: Four times a day (QID) | ORAL | Status: DC | PRN
  Administered 2015-09-08: 650 mg via ORAL
  Filled 2015-09-06: qty 2

## 2015-09-06 MED ORDER — VITAMIN-B COMPLEX PO TABS
1.0000 | ORAL_TABLET | ORAL | Status: DC
  Administered 2015-09-06 – 2015-09-08 (×2): 1 via ORAL
  Filled 2015-09-06 (×3): qty 1

## 2015-09-06 MED ORDER — QUETIAPINE FUMARATE 100 MG PO TABS
100.0000 mg | ORAL_TABLET | Freq: Every day | ORAL | Status: DC
  Filled 2015-09-06 (×2): qty 1

## 2015-09-06 MED ORDER — HYDROXYZINE HCL 50 MG PO TABS
50.0000 mg | ORAL_TABLET | Freq: Four times a day (QID) | ORAL | Status: DC | PRN
  Administered 2015-09-07 (×2): 50 mg via ORAL
  Filled 2015-09-06 (×2): qty 1

## 2015-09-06 MED ORDER — VITAMIN C 500 MG PO TABS
500.0000 mg | ORAL_TABLET | Freq: Every day | ORAL | Status: DC
  Administered 2015-09-06 – 2015-09-09 (×4): 500 mg via ORAL
  Filled 2015-09-06 (×6): qty 1

## 2015-09-06 MED ORDER — LAMOTRIGINE 25 MG PO TABS
25.0000 mg | ORAL_TABLET | Freq: Two times a day (BID) | ORAL | Status: DC
  Administered 2015-09-06 – 2015-09-09 (×6): 25 mg via ORAL
  Filled 2015-09-06 (×12): qty 1

## 2015-09-06 MED ORDER — ALUM & MAG HYDROXIDE-SIMETH 200-200-20 MG/5ML PO SUSP: 30.0000 mL | ORAL | Status: DC | PRN

## 2015-09-06 NOTE — [facility] Group Notes (Signed)
[facility] LCSW Group Therapy 09/06/2015 1:15 PM  Type of Therapy: Group Therapy- Feelings about Diagnosis  Participation Level: Active   Participation Quality:  Appropriate  Affect:  Appropriate  Cognitive: Alert and Oriented   Insight:  Developing   Engagement in Therapy: Developing/Improving and Engaged   Modes of Intervention: Clarification, Confrontation, Discussion, Education, Exploration, Limit-setting, Orientation, Problem-solving, Rapport Building, Dance movement psychotherapist, Socialization and Support  Description of Group:   This group will allow patients to explore their thoughts and feelings about diagnoses they have received. Patients will be guided to explore their level of understanding and acceptance of these diagnoses. Facilitator will encourage patients to process their thoughts and feelings about the reactions of others to their diagnosis, and will guide patients in identifying ways to discuss their diagnosis with significant others in their lives. This group will be process-oriented, with patients participating in exploration of their own experiences as well as giving and receiving support and challenge from other group members.  Summary of Progress/Problems:  Pt participated appropriately and focused her discussion on her frustration with family members who do not know how to support her or downplay her mental health concerns. She interacted well with peers and was receptive to feedback.  Therapeutic Modalities:   Cognitive Behavioral Therapy Solution Focused Therapy Motivational Interviewing Relapse Prevention Therapy  Chad Cordial, LCSWA 09/06/2015 4:59 PM

## 2015-09-06 NOTE — Plan of Care (Signed)
Problem: Consults Goal: Depression Patient Education See Patient Education Module for education specifics.  Outcome: Progressing Nurse discussed depression/coping skills with patient.        

## 2015-09-06 NOTE — Tx Team (Signed)
Initial Interdisciplinary Treatment Plan   PATIENT STRESSORS: Educational concerns Marital or family conflict Occupational concerns Substance abuse   PATIENT STRENGTHS: Ability for insight Average or above average intelligence Communication skills General fund of knowledge Motivation for treatment/growth Physical Health Supportive family/friends Work skills   PROBLEM LIST: Problem List/Patient Goals Date to be addressed Date deferred Reason deferred Estimated date of resolution  "anxiety" 09/06/2015   D/c  "very depressed" 09/06/2015   D/c  "panic attacks" 09/06/2015   D/c  "substance abuse" 09/06/2015   D/c  "suicidal thoughts" 09/06/2015   D/c                           DISCHARGE CRITERIA:  Ability to meet basic life and health needs Adequate post-discharge living arrangements Improved stabilization in mood, thinking, and/or behavior Medical problems require only outpatient monitoring Motivation to continue treatment in a less acute level of care Need for constant or close observation no longer present Reduction of life-threatening or endangering symptoms to within safe limits Safe-care adequate arrangements made Verbal commitment to aftercare and medication compliance Withdrawal symptoms are absent or subacute and managed without 24-hour nursing intervention  PRELIMINARY DISCHARGE PLAN: Attend aftercare/continuing care group Attend PHP/IOP Attend 12-step recovery group Outpatient therapy Participate in family therapy Return to previous living arrangement Return to previous work or school arrangements  PATIENT/FAMIILY INVOLVEMENT: This treatment plan has been presented to and reviewed with the patient, Abbagail Burtch.  The patient and family have been given the opportunity to ask questions and make suggestions.  Earline Mayotte 09/06/2015, 1:28 PM
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
## 2015-09-06 NOTE — Progress Notes (Signed)
D: Patient alert and oriented x 4. Patient denies pain/SI/HI/AVH. Patient started pacing in the hallway and then went into room.  This writer went to speak with patient to see if something was wrong, patient stated, "My anxiety level is up, I just don't want to be here. I was just here and I am afraid I am going to be here a long time like I was here the last time." Patient stated she was coming here for PHP.  A: Staff to monitor Q 15 mins for safety. Encouragement and support offered. Scheduled medications administered per orders. R: Patient remains safe on the unit. Patient attended group tonight. Patient visible on hte unit and interacting with peers. Patient taking administered medications.
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
## 2015-09-06 NOTE — Progress Notes (Signed)
Recreation Therapy Notes  Animal-Assisted Activity (AAA) Program Checklist/Progress Notes Patient Eligibility Criteria Checklist & Daily Group note for Rec Tx Intervention  Date: 10.04.2016 Time: 2:45pm Location: [address] Dayroom    AAA/T Program Assumption of Risk Form signed by Patient/ or Parent Legal Guardian yes  Patient is free of allergies or sever asthma yes  Patient reports no fear of animals yes  Patient reports no history of cruelty to animals yes  Patient understands his/her participation is voluntary yes  Patient washes hands before animal contact yes  Patient washes hands after animal contact yes  Behavioral Response: Attentive  Education: Hand Washing, Appropriate Animal Interaction   Education Outcome: Acknowledges education.   Clinical Observations/Feedback: Patient actively engaged in session, petting therapy dog appropriately and interacting with peers appropriately.   Naomy Esham L Tevita Gomer, LRT/CTRS  Albirta Rhinehart L 09/06/2015 3:11 PM 

## 2015-09-06 NOTE — Progress Notes (Signed)
Adult Psychoeducational Group Note  Date:  09/06/2015 Time:  9:54 PM  Group Topic/Focus:  Wrap-Up Group:   The focus of this group is to help patients review their daily goal of treatment and discuss progress on daily workbooks.  Participation Level:  Active  Participation Quality:  Appropriate  Affect:  Appropriate  Cognitive:  Appropriate  Insight: Appropriate  Engagement in Group:  Engaged  Modes of Intervention:  Discussion  Additional Comments:  The patient expressed that she attended group.The patient also said that group was about understanding diagnosis.  Octavio Manns 09/06/2015, 9:54 PM

## 2015-09-06 NOTE — Psych (Addendum)
[facility] Partial Program Consult Note (Note this is an update from the assessment note that therapist completed on 06/16/15 when patient last was assessed for the [facility].)  Chief Complaint: Lisa [name] is [name] 24 y.o. female patient who relates that she is here today due to depression. She can't get out of bed. She relates that "she forgot how to live". She quit her job because she could barely do her shifts. She has had suicidal thoughts but not right now. She had them yesterday and the day before. She has thought about taking all her medicine. She said that she stopped because of her "head". She feels hopeless. She doesn't want to go inpatient because she feels like it is an endless cycle and doesn't want to repeat. She doesn't want to put any financial burden on her parents. She thinks that she might need [name] medication adjustment.  HPI: She fought with her dad yesterday. She was going to see ex-fiance and her dad was mad. He told her that she was stupid and that he was going to kick her out. He said that she doesn't have [name] mental problem and that her mental problem is stupidity. He wants to kick her out if she sees ex-fiance and that she will end up prostituting herself. She is tearful and hopeless. She feels debilitated and incompetent. She says "It is an awful" and she feels she is never going to get better. Her dad is also mad because she fell for [name] job scam. She is now 4800 in debt. She was manic. He is holding it over her head and frustrated with the debt. She did break up with her boyfriend 5 days ago ( was feeling depressed even before then) , and states she had smoked cannabis on several occasions recently.  Associated Signs/Symptoms: Depression Symptoms:  depressed mood, anhedonia, hypersomnia, psychomotor retardation, recurrent thoughts of death, decreased appetite, 8 lb weight loss  Denies SIB (Hypo) Manic Symptoms:   At this time does not endorse mania, hypomania, and  does not present with any symptoms at present. Patient has [name] history of psychotic symptoms during mood episodes in the past, but at this time has no symptoms of psychosis.  PTSD-denies HI-does not endorse HI and does not have [name] history of HI  Psychiatric History-Patient had two recent hospitalizations and followed up for aftercare to the [facility] in July. She was hospitalized for delusions and manic behavior. There is no history of suicide attempts. Her history also includes that she follows up with Dr. Jannifer Franklin at [facility] for outpatient psychiatric management. She had been diagnosed with Bipolar Disorder, most recently manic on last admission to [facility]. She states her first episode occurred in 2014 in the context of going to the [place] She has had three prior psychiatric admissions.   Substance Abuse History in the last 12 months:  She has [name] history of alcohol use. Last drink 2 weeks ago, ususallly 3 drinks weekly. She reports she has stopped drinking alcohol recently, denies any abuse. Denies blackouts, denies any negative consequences. (+) Recent cannabis use- last used 2 weeks ago. Of note, her initial mood episode which occurred  In the context of cannabis abuse, at age 28.   Longest period of sobriety (when/how long): unsure Name of Substance 1: THC 1 - Age of First Use: 24 yrs old 1 - Amount (size/oz): unsure 1 - Frequency: 2 weeks ago 1 - Duration: usually every 2-3 weeks 1 - Last Use / Amount: unsure  Previous Psychotropic Medications: Seroquel , Abilify, Lamictal, Propranolol. States she feels the Propranolol in particular has not worked well for her  Past Medical History: denies any medical illnesses , smokes 3 cigarettes per day.  Past Medical History   Diagnosis  Date   .  Anxiety     .  Depression     .  Bipolar disorder [facility])       Past Surgical History   Procedure  Laterality  Date   .  Extraction of wisdom teeth        Family History: Mother and father  alive, live together, has two sisters.  Family History   Problem  Relation  Age of Onset   .  Schizophrenia  Mother     .  Depression  Father     .  Seizures  Sister      Pain Medications: tylenol Prescriptions: seroquel 100 mg   lamictal 25 mg   abilify 10 mg   propanolol    vistaril Over the Counter: tylenol Family Psychiatric  History:  Mother has been diagnosed with schizophrenia, and father has history of depression. No suicides in family. No substance abuse or alcohol abuse in the family.  Social History:  Single, no children, lives with parents, works at [employer], no legal issues, recent break up with BF.   I have reviewed the following documentation dated 06/16/15 [facility] Behavioral Health Partial Program Assessment Note and 06/21/15 Initial Psychiatric Assessment / Admit Note to PHP that includes past psychiatric history, past medical history and/past social and family history.   Assessment-  24year old single female, who has [name] history of  Bipolar Disorder, with recent  Manic decompensation and [name] psychiatric admission in early July for delusions . She has [name] history of  Mood/psychotic episodes since 2014. At this time she presents with severe depression and recent suicide thoughts with [name] plan. Inpatient care is warranted due to severity of depression and high level of risk given recent suicide thoughts and plan. She needs further stabilization and medication adjustment prior to starting the PHP. Patient agreed to plan and her mom was called and her mom was in agreement with plan.  Plan: 1.Refer inpatient and once stabilized to return to [facility].     Dx.  Bipolar Disorder, current episode depressed  Without psychotic feaatures.    Lisa [name]

## 2015-09-06 NOTE — H&P (Addendum)
Psychiatric Admission Assessment Adult  Patient Identification: Lisa [name] MRN:  [id number] Date of Evaluation:  09/06/2015 Chief Complaint:   " I have never been this depressed "  Principal Diagnosis: Bipolar Disorder , Depressed  Diagnosis:   Patient Active Problem List   Diagnosis Date Noted  . Schizoaffective disorder, bipolar type (HCC) [F25.0] 09/06/2015  . Paranoia (HCC) [F22] 06/08/2015  . Hyperprolactinemia (HCC) [E22.1] 05/17/2015  . Bipolar disorder, current episode manic severe with psychotic features (HCC) [F31.2] 05/13/2015   History of Present Illness::  24 year old single female, known to our unit from prior psychiatric admissions , most recently in July.  She has been diagnosed with Bipolar Disorder in the past.  She states she has been feeling very depressed recently, particularly over the last week. She cannot identify any particular stressor, although she did recently broke up with boyfriend 5 days ago ( was feeling depressed even before then) , and states she had smoked cannabis on several occasions recently. States " I don't think I have ever been so depressed ". She states she thought about enrolling in [facility], but was encouraged to seek inpatient treatment as she had  Developed  Some suicidal ideations  Of overdosing on medications, but states that she did not attempt . Reports neuro- vegetative symptoms as below, and states she has felt very tired, " sluggish".  Patient has a history of psychotic symptoms during mood episodes in the past, but at this time has no symptoms of psychosis.   Associated Signs/Symptoms: Depression Symptoms:  depressed mood, anhedonia, hypersomnia, psychomotor retardation, recurrent thoughts of death, decreased appetite, 8 lb weight loss  (Hypo) Manic Symptoms:   At this time does not endorse mania, hypomania, and does not present with any symptoms at present  Anxiety Symptoms:   Psychotic Symptoms:   Not at present .  PTSD  Symptoms:  denies  Total Time spent with patient: 45 minutes  Past Psychiatric History:  She has been diagnosed with Bipolar Disorder. She was hospitalized most recently in June 2016 for manic decompensation. Decompensations course with psychosis. She states her first episode occurred in 2014 in the context of going to the [place] . She has had three prior psychiatric admissions. Denies history of suicide attempts .She follows up with Dr. Jannifer Franklin at [facility] for outpatient psychiatric management.  Risk to Self: Suicidal Ideation: Yes-Currently Present Suicidal Intent: Yes-Currently Present Is patient at risk for suicide?: Yes Suicidal Plan?: Yes-Currently Present Specify Current Suicidal Plan: Overdose on medication  Access to Means: Yes Specify Access to Suicidal Means: Prescribed medication or OTC neds What has been your use of drugs/alcohol within the last 12 months?: Marijuanna in the past  How many times?: 0 Other Self Harm Risks: None Reported Triggers for Past Attempts:  (NA) Intentional Self Injurious Behavior: None Risk to Others: Homicidal Ideation: No Thoughts of Harm to Others: No Current Homicidal Intent: No Current Homicidal Plan: No Access to Homicidal Means: No Identified Victim: NA History of harm to others?: No Assessment of Violence: None Noted Violent Behavior Description: NA Does patient have access to weapons?: No Criminal Charges Pending?: No Does patient have a court date: No Prior Inpatient Therapy: Prior Inpatient Therapy: Yes Prior Therapy Dates: 2014; 2015;2016 Prior Therapy Facilty[facility]Provider(s): [facility] Reason for Treatment: SI Prior Outpatient Therapy: Prior Outpatient Therapy: Yes Prior Therapy Dates: Ongoing Prior Therapy Facilty/Provider(s): Dr. Jannifer Franklin; PHP at [facility] Reason for Treatment: Outpatient Therapy Does patient have an ACCT team?: No Does patient have  Intensive In-House Services?  : No Does  patient have [facility] services? : No Does patient have P4CC services?: No  Alcohol Screening: 1. How often do you have a drink containing alcohol?: 2 to 3 times a week 2. How many drinks containing alcohol do you have on a typical day when you are drinking?: 3 or 4 3. How often do you have six or more drinks on one occasion?: Never Preliminary Score: 1 4. How often during the last year have you found that you were not able to stop drinking once you had started?: Never 5. How often during the last year have you failed to do what was normally expected from you becasue of drinking?: Never 6. How often during the last year have you needed a first drink in the morning to get yourself going after a heavy drinking session?: Never 7. How often during the last year have you had a feeling of guilt of remorse after drinking?: Never 8. How often during the last year have you been unable to remember what happened the night before because you had been drinking?: Never 9. Have you or someone else been injured as a result of your drinking?: No 10. Has a relative or friend or a doctor or another health worker been concerned about your drinking or suggested you cut down?: No Alcohol Use Disorder Identification Test Final Score (AUDIT): 4 Brief Intervention: AUDIT score less than 7 or less-screening does not suggest unhealthy drinking-brief intervention not indicated Substance Abuse History in the last 12 months:  Has been drinking at times, in the evenings, 1-2 beers , denies any abuse. Denies blackouts, denies any negative consequences. (+) Recent cannabis use- last used 2 weeks ago. Of note, her initial mood episode which occurred  In the context of cannabis abuse , at age 71.     Consequences of Substance Abuse: as above  Previous Psychotropic Medications: Seroquel , Abilify, Lamictal, Propranolol. States she feels the Propranolol in particular has not worked well for her - " it makes me feel tired ".   Psychological Evaluations:  No  Past Medical History: denies any medical illnesses , smokes 3 cigarettes per day.  Past Medical History  Diagnosis Date  . Anxiety   . Depression   . Bipolar disorder [facility])     Past Surgical History  Procedure Laterality Date  . Extraction of wisdom teeth     Family History: Mother and father alive, live together, has two sisters.  Family History  Problem Relation Age of Onset  . Schizophrenia Mother   . Depression Father   . Seizures Sister    Family Psychiatric  History:  Mother has been diagnosed with schizophrenia, and father has history of depression. No suicides in family. No substance abuse or alcohol abuse in the family.  Social History:  Single, no children, lives with parents, works at [employer], no legal issues, recent break up with BF.  History  Alcohol Use  . Yes    Comment: last drink 2 weeks ago, usually 3 drinks weekly     History  Drug Use  . Yes  . Special: Marijuana    Comment: THC last used 2 yrs ago, unsure of amount    Social History   Social History  . Marital Status: Single    Spouse Name: N/A  . Number of Children: N/A  . Years of Education: N/A   Social History Main Topics  . Smoking status: Former Smoker -- 0.25 packs/day for .5  years    Types: Cigarettes    Quit date: 09/03/2015  . Smokeless tobacco: None  . Alcohol Use: Yes     Comment: last drink 2 weeks ago, usually 3 drinks weekly  . Drug Use: Yes    Special: Marijuana     Comment: THC last used 2 yrs ago, unsure of amount  . Sexual Activity: No     Comment: Plan B Pill   Other Topics Concern  . None   Social History Narrative   Additional Social History:    Pain Medications: tylenol Prescriptions: seroquel 100 mg   lamictal 25 mg   abilify 10 mg   propanolol    vistaril Over the Counter: tylenol History of alcohol / drug use?: Yes Longest period of sobriety (when/how long): unsure Negative Consequences of Use: Financial, Personal  relationships, Work / Programmer, multimedia Withdrawal Symptoms: Other (Comment) (anxiety, depression) Name of Substance 1: THC 1 - Age of First Use: 24 yrs old 1 - Amount (size/oz): unsure 1 - Frequency: 2 weeks ago 1 - Duration: usually every 2-3 weeks 1 - Last Use / Amount: unsure  Allergies:   Allergies  Allergen Reactions  . Artane [Trihexyphenidyl] Anaphylaxis  . Invega [Paliperidone Er] Other (See Comments)    eps and prolactin increase   . Ambien [Zolpidem] Other (See Comments)    Sleep walking  . Other Other (See Comments)    Nuts - food poisoning effect, makes sick on stomach    Lab Results: No results found for this or any previous visit (from the past 48 hour(s)).  Metabolic Disorder Labs:  Lab Results  Component Value Date   HGBA1C 5.2 05/14/2015   MPG 103 05/14/2015   Lab Results  Component Value Date   PROLACTIN 18.6 06/10/2015   PROLACTIN 77.4* 05/14/2015   Lab Results  Component Value Date   CHOL 143 05/14/2015   TRIG 120 05/14/2015   HDL 42 05/14/2015   CHOLHDL 3.4 05/14/2015   VLDL 24 05/14/2015   LDLCALC 77 05/14/2015    Current Medications: Current Facility-Administered Medications  Medication Dose Route Frequency Provider Last Rate Last Dose  . acetaminophen (TYLENOL) tablet 650 mg  650 mg Oral Q6H PRN Thermon Leyland, NP      . alum & mag hydroxide-simeth (MAALOX/MYLANTA) 200-200-20 MG/5ML suspension 30 mL  30 mL Oral Q4H PRN Thermon Leyland, NP      . ARIPiprazole (ABILIFY) tablet 7.5 mg  7.5 mg Oral BID Thermon Leyland, NP      . hydrOXYzine (ATARAX/VISTARIL) tablet 50 mg  50 mg Oral Q6H PRN Thermon Leyland, NP      . magnesium hydroxide (MILK OF MAGNESIA) suspension 30 mL  30 mL Oral Daily PRN Thermon Leyland, NP      . QUEtiapine (SEROQUEL) tablet 100 mg  100 mg Oral QHS Thermon Leyland, NP      . vitamin C (ASCORBIC ACID) tablet 500 mg  500 mg Oral Daily Thermon Leyland, NP   500 mg at 09/06/15 1507  . Vitamin-B Complex TABS 1 tablet  1 tablet Oral QODAY Thermon Leyland, NP   1 tablet at 09/06/15 1507   PTA Medications: Prescriptions prior to admission  Medication Sig Dispense Refill Last Dose  . ARIPiprazole (ABILIFY) 10 MG tablet Take 1 tablet (10 mg total) by mouth 2 (two) times daily. (Patient taking differently: Take 7.5 mg by mouth 2 (two) times daily. ) 60 tablet 0 09/06/2015 at Unknown time  .  B Complex Vitamins (VITAMIN-B COMPLEX) TABS Take 1 tablet by mouth every other day.   09/05/2015 at Unknown time  . ferrous sulfate 325 (65 FE) MG tablet Take 325 mg by mouth See admin instructions. One tablet weekly as needed for weakness   Past Week at Unknown time  . lamoTRIgine (LAMICTAL) 25 MG tablet Take 1 tablet (25 mg total) by mouth daily. 30 tablet 0 09/06/2015 at Unknown time  . propranolol (INDERAL) 10 MG tablet Take 10 mg by mouth 2 (two) times daily.   09/06/2015 at 1000,2200  . QUEtiapine (SEROQUEL) 100 MG tablet Take 1 tablet (100 mg total) by mouth at bedtime. 30 tablet 0 09/05/2015 at Unknown time  . vitamin C (ASCORBIC ACID) 500 MG tablet Take 500 mg by mouth daily.   09/05/2015 at Unknown time  . hydrOXYzine (ATARAX/VISTARIL) 50 MG tablet Take 1 tablet (50 mg total) by mouth every 6 (six) hours as needed for anxiety. (Patient not taking: Reported on 09/06/2015) 30 tablet 0 Not Taking at Unknown time    Musculoskeletal: Strength & Muscle Tone: within normal limits Gait & Station: normal Patient leans: N/A  Psychiatric Specialty Exam: Physical Exam  Review of Systems  Constitutional: Negative.   HENT: Negative.   Eyes: Negative.   Respiratory: Negative.   Cardiovascular: Negative.   Gastrointestinal: Negative.   Genitourinary: Negative.   Musculoskeletal: Negative.   Skin: Negative.   Neurological: Negative for seizures.  Psychiatric/Behavioral: Positive for depression, suicidal ideas and substance abuse.  all other systems negative   Blood pressure 116/61, pulse 81, temperature 98.5 F (36.9 C), temperature source Oral, resp. rate  18, height 5' 5.5" (1.664 m), weight 142 lb (64.411 kg), last menstrual period 08/30/2015, SpO2 99 %.Body mass index is 23.26 kg/(m^2).  General Appearance: Well Groomed  Patent attorney::  Good  Speech:  Normal Rate  Volume:  Normal  Mood:  Depressed  Affect:  Constricted  Thought Process:  Linear  Orientation:  Full (Time, Place, and Person)  Thought Content:  denies hallucinations, no delusions, not internally preoccupied   Suicidal Thoughts:  No at this time denies any plan or intention of hurting self or of SI   Homicidal Thoughts:  No  Memory:  recent and remote grossly intact   Judgement:  Fair  Insight:  Present  Psychomotor Activity:  Normal  Concentration:  Good  Recall:  Good  Fund of Knowledge:Good  Language: Good  Akathisia:  Negative  Handed:  Right  AIMS (if indicated):     Assets:  Communication Skills Desire for Improvement Resilience  ADL's:  Intact  Cognition: WNL  Sleep:        Treatment Plan Summary: Daily contact with patient to assess and evaluate symptoms and progress in treatment, Medication management, Plan inpatient admission and medications as  below  Observation Level/Precautions:  15 minute checks  Laboratory:  as needed - obtain urine drug screen   Psychotherapy:  Supportive, milieu  Medications: We discussed options - she feels Seroquel has helped the most, and has had side effects from Propranolol ( which had been prescribed for anxiety) . Agrees to titrating Lamictal to 25 mgrs BID , Seroquel to 150 mgrs QHS   Consultations:  As needed   Discharge Concerns:  Interested in [facility] after discharge   Estimated LOS: 5 days   Other:     I certify that inpatient services furnished can reasonably be expected to improve the patient's condition.   COBOS, FERNANDO 10/4/20164:02 PM
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
## 2015-09-07 LAB — PREGNANCY, URINE: Preg Test, Ur: NEGATIVE

## 2015-09-07 MED ORDER — QUETIAPINE FUMARATE 200 MG PO TABS
200.0000 mg | ORAL_TABLET | Freq: Every day | ORAL | Status: DC
  Administered 2015-09-07 – 2015-09-08 (×2): 200 mg via ORAL
  Filled 2015-09-07 (×4): qty 1

## 2015-09-07 NOTE — BHH Suicide Risk Assessment (Signed)
[facility] INPATIENT:  Family/Significant Other Suicide Prevention Education  Suicide Prevention Education:  Education Completed; Destinee Taber (mother) [phone] has been identified by the patient as the family member/significant other with whom the patient will be residing, and identified as the person(s) who will aid the patient in the event of a mental health crisis (suicidal ideations/suicide attempt).  With written consent from the patient, the family member/significant other has been provided the following suicide prevention education, prior to the and/or following the discharge of the patient.  The suicide prevention education provided includes the following:  Suicide risk factors  Suicide prevention and interventions  National Suicide Hotline telephone number  [facility] assessment telephone number  [facility] Emergency Assistance 911  [facility] and/or Residential Mobile Crisis Unit telephone number  Request made of family/significant other to:  Remove weapons (e.g., guns, rifles, knives), all items previously/currently identified as safety concern.    Remove drugs/medications (over-the-counter, prescriptions, illicit drugs), all items previously/currently identified as a safety concern.  The family member/significant other verbalizes understanding of the suicide prevention education information provided.  The family member/significant other agrees to remove the items of safety concern listed above.  Jonathon Jordan 09/07/2015, 2:49 PM

## 2015-09-07 NOTE — Progress Notes (Signed)
Recreation Therapy Notes   Date: 10.05.2016 Time: 9:30am Location: [address] Group Room   Group Topic: Stress Management  Goal Area(s) Addresses:  Patient will actively participate in stress management techniques presented during session.   Behavioral Response: Did not attend.   Cortana Vanderford L Oluwadarasimi Favor, LRT/CTRS        Kayron Kalmar L 09/07/2015 1:13 PM 

## 2015-09-07 NOTE — BHH Group Notes (Signed)
[facility] LCSW Aftercare Discharge Planning Group Note  09/07/2015 8:45 AM  Participation Quality: Alert, Appropriate and Oriented  Mood/Affect: Flat  Depression Rating: 2  Anxiety Rating: 3  Thoughts of Suicide: Pt denies SI/HI  Will you contract for safety? Yes  Current AVH: Pt denies  Plan for Discharge/Comments: Pt attended discharge planning group and actively participated in group. CSW discussed suicide prevention education with the group and encouraged them to discuss discharge planning and any relevant barriers. Pt reports feeling better today than yesterday and described her plans of returning to the [facility] at [facility].  Transportation Means: Pt reports access to transportation  Supports: No supports mentioned at this time  Chad Cordial, LCSWA 09/07/2015 9:36 AM

## 2015-09-07 NOTE — BHH Group Notes (Signed)
[facility] LCSW Group Therapy 09/07/2015 1:15 PM  Type of Therapy: Group Therapy- Emotion Regulation  Participation Level: Active   Participation Quality:  Appropriate  Affect: Appropriate  Cognitive: Alert and Oriented   Insight:  Developing/Improving  Engagement in Therapy: Developing/Improving and Engaged   Modes of Intervention: Clarification, Confrontation, Discussion, Education, Exploration, Limit-setting, Orientation, Problem-solving, Rapport Building, Dance movement psychotherapist, Socialization and Support  Summary of Progress/Problems: The topic for group today was emotional regulation. This group focused on both positive and negative emotion identification and allowed group members to process ways to identify feelings, regulate negative emotions, and find healthy ways to manage internal/external emotions. Group members were asked to reflect on a time when their reaction to an emotion led to a negative outcome and explored how alternative responses using emotion regulation would have benefited them. Group members were also asked to discuss a time when emotion regulation was utilized when a negative emotion was experienced. Pt participated actively in group discussion. She identified fear and anxiety as difficult emotions to regulate. CSW processed with Pt the innate need for fear and how it is a "fast-acting emotion" which at times makes our irrational thoughts seem overwhelming and more worrisome than in reality. Pt was able to process this and agreed that her anxiety was often unwarranted and that her fears were not based in truth. She also reports that grief is difficult to regulate due to the aspect of not having control over the trigger. Pt expressed that she would like to be "easier on herself" as she often feels guilt for broken relationships.   Chad Cordial, LCSWA 09/07/2015 4:13 PM

## 2015-09-07 NOTE — Plan of Care (Signed)
Problem: Alteration in mood Goal: LTG-Patient reports reduction in suicidal thoughts (Patient reports reduction in suicidal thoughts and is able to verbalize a safety plan for whenever patient is feeling suicidal)  Outcome: Progressing Pt denies SI tonight.  She verbally contracts for safety.      

## 2015-09-07 NOTE — Progress Notes (Signed)
Pt stated that she had an ok day. Her goal for today was not to sleep too much and she was able to achieve that.

## 2015-09-07 NOTE — Progress Notes (Signed)
D) Ptl affect blunted.  Mood appears depressed.  Pt. Continues to report anxiety.  Pt. Noted attending groups and reported anxiety 3/10 this am.  Pt. C/o anxiety at 1300.  A) Pt. Offered support and received prn vistaril.  Pt. Noted napping this afternoon.  R) Pt. Receptive and remains safe, continues on q 15 min. Observations.
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
## 2015-09-07 NOTE — Tx Team (Signed)
Interdisciplinary Treatment Plan Update (Adult)  Date:  09/07/2015 Time Reviewed:  1:57 PM  Progress in Treatment: Attending groups: Yes. Participating in groups: Yes. Taking medication as prescribed:  Yes. Tolerating medication:  Yes. Family/Significant othe contact made:  Yes, contacted Lisabeth Register (mother) [phone]  Patient understands diagnosis:  Yes, as evidenced by seeking help with suicidal ideation. Discussing patient identified problems/goals with staff:  Yes, see initial care plan. Medical problems stabilized or resolved:  Yes Denies suicidal/homicidal ideation: Yes. Issues/concerns per patient self-inventory: No. Other:  New problem(s) identified:   Discharge Plan or Barriers: Pt will return home, follow up outpt   Reason for Continuation of Hospitalization: Depression Suicidal ideation  Comments: 24 year old single female, known to our unit from prior psychiatric admissions , most recently in July.  She has been diagnosed with Bipolar Disorder in the past.  She states she has been feeling very depressed recently, particularly over the last week. She cannot identify any particular stressor, although she did recently broke up with boyfriend 5 days ago ( was feeling depressed even before then) , and states she had smoked cannabis on several occasions recently. States " I don't think I have ever been so depressed ". She states she thought about enrolling in [facility], but was encouraged to seek inpatient treatment as she had  Developed Some suicidal ideations Of overdosing on medications, but states that she did not attempt . Reports neuro- vegetative symptoms as below, and states she has felt very tired, " sluggish".  Patient has a history of psychotic symptoms during mood episodes in the past, but at this time has no symptoms of psychosis. Seroquel  Estimated length of stay: 4-5 days  New goal(s):  Review of initial/current patient goals per problem list:  1.  Goal(s): Patient will participate in aftercare plan  Met:No  Target date: at discharge  As evidenced by: Patient will participate within aftercare plan AEB aftercare provider and housing plan at discharge being identified. 09/07/15: Pt is agreeable to PHP and continuing to see Dr. Darleene Cleaver. However, no scheduled apt at this time.  2. Goal (s): Patient will exhibit decreased depressive symptoms and suicidal ideations.  Met:Yes  Target date: at discharge  As evidenced by: Patient will utilize self rating of depression at 3 or below and demonstrate decreased signs of depression or be deemed stable for discharge by MD. 09/07/15: Pt denies depression and SI at this time. Will continue to assess. Pt rates depression at 2/10     Attendees: Patient:  09/07/2015 1:57 PM  Family:   09/07/2015 1:57 PM  Physician:  Dr. Neita Garnet, MD 09/07/2015 1:57 PM  Nursing:   Johnney Ou, RN 09/07/2015 1:57 PM  Case Manager:  Peri Maris, [place] 09/07/2015 1:57 PM  Counselor:  Matthew Saras, MSW Intern 09/07/2015 1:57 PM  Other:   09/07/2015 1:57 PM  Other:   09/07/2015 1:57 PM  Other:   09/07/2015 1:57 PM  Other:  09/07/2015 1:57 PM  Other:    Other:    Other:    Other:    Other:    Other:      Scribe for Treatment Team:   Georga Kaufmann, MSW Intern 09/07/2015 1:57 PM

## 2015-09-07 NOTE — Progress Notes (Signed)
D: Pt was in dayroom upon initial approach.  Pt has depressed affect and mood.  She reports her goal today was "to not stay in bed all day."  Pt denies SI/HI, denies hallucinations, denies pain.  Pt has been visible in milieu interacting with peers and staff appropriately.  Pt attended evening group.   A: Introduced self to pt.  Met with pt 1:1 and provided support and encouragement.  Actively listened to pt.  Medications administered per order.   R: Pt is compliant with medication.  Pt verbally contracts for safety.  Will continue to monitor and assess.

## 2015-09-07 NOTE — Progress Notes (Signed)
[facility] MD Progress Note  09/07/2015 4:25 PM Lisa [name]  MRN:  [id number] Subjective:   Patient reports she is feeling partially better than upon admission. She reports ongoing depression, but to a lesser degree than before . She denies psychotic symptoms. At this time denies medication side effects. Objective : I have discussed case with treatment team and have met with patient. She is visible on unit, going to groups, no disruptive or agitated behaviors on unit. Although calm, pleasant upon approach, tends to keep to self and interactions with peers is limited . She is tolerating medications well and we reviewed rationale and side effects to include risk of weight gain and metabolic side effects from Seroquel And risk of severe rash from Lamictal.  Principal Problem: Bipolar I disorder, most recent episode depressed ([place]) Diagnosis:   Patient Active Problem List   Diagnosis Date Noted  . Schizoaffective disorder, bipolar type ([place]) [F25.0] 09/06/2015  . Bipolar I disorder, most recent episode depressed ([place]) [F31.30] 09/06/2015  . Paranoia ([place]) [F22] 06/08/2015  . Hyperprolactinemia ([place]) [E22.1] 05/17/2015  . Bipolar disorder, current episode manic severe with psychotic features ([place]) [F31.2] 05/13/2015   Total Time spent with patient: 25 minutes     Past Medical History:  Past Medical History  Diagnosis Date  . Anxiety   . Depression   . Bipolar disorder [facility])     Past Surgical History  Procedure Laterality Date  . Extraction of wisdom teeth     Family History:  Family History  Problem Relation Age of Onset  . Schizophrenia Mother   . Depression Father   . Seizures Sister     Social History:  History  Alcohol Use  . Yes    Comment: last drink 2 weeks ago, usually 3 drinks weekly     History  Drug Use  . Yes  . Special: Marijuana    Comment: THC last used 2 yrs ago, unsure of amount    Social History   Social History  . Marital Status: Single    Spouse  Name: N/A  . Number of Children: N/A  . Years of Education: N/A   Social History Main Topics  . Smoking status: Former Smoker -- 0.25 packs/day for .5 years    Types: Cigarettes    Quit date: 09/03/2015  . Smokeless tobacco: None  . Alcohol Use: Yes     Comment: last drink 2 weeks ago, usually 3 drinks weekly  . Drug Use: Yes    Special: Marijuana     Comment: THC last used 2 yrs ago, unsure of amount  . Sexual Activity: No     Comment: Plan B Pill   Other Topics Concern  . None   Social History Narrative   Additional Social History:    Pain Medications: tylenol Prescriptions: seroquel 100 mg   lamictal 25 mg   abilify 10 mg   propanolol    vistaril Over the Counter: tylenol History of alcohol / drug use?: Yes Longest period of sobriety (when/how long): unsure Negative Consequences of Use: Financial, Personal relationships, Work / Youth worker Withdrawal Symptoms: Other (Comment) (anxiety, depression) Name of Substance 1: THC 1 - Age of First Use: 24 yrs old 1 - Amount (size/oz): unsure 1 - Frequency: 2 weeks ago 1 - Duration: usually every 2-3 weeks 1 - Last Use / Amount: unsure  Sleep: Good  Appetite:  fair, but has improved since admission  Current Medications: Current Facility-Administered Medications  Medication Dose Route Frequency Provider Last  Rate Last Dose  . acetaminophen (TYLENOL) tablet 650 mg  650 mg Oral Q6H PRN Niel Hummer, NP      . alum & mag hydroxide-simeth (MAALOX/MYLANTA) 200-200-20 MG/5ML suspension 30 mL  30 mL Oral Q4H PRN Niel Hummer, NP      . hydrOXYzine (ATARAX/VISTARIL) tablet 50 mg  50 mg Oral Q6H PRN Niel Hummer, NP   50 mg at 09/07/15 1302  . lamoTRIgine (LAMICTAL) tablet 25 mg  25 mg Oral BID Jenne Campus, MD   25 mg at 09/07/15 0855  . magnesium hydroxide (MILK OF MAGNESIA) suspension 30 mL  30 mL Oral Daily PRN Niel Hummer, NP      . QUEtiapine (SEROQUEL) tablet 200 mg  200 mg Oral QHS Myer Peer Cobos, MD      . vitamin C  (ASCORBIC ACID) tablet 500 mg  500 mg Oral Daily Niel Hummer, NP   500 mg at 09/07/15 0855  . Vitamin-B Complex TABS 1 tablet  1 tablet Oral QODAY Niel Hummer, NP   1 tablet at 09/06/15 1507    Lab Results:  Results for orders placed or performed during the hospital encounter of 09/06/15 (from the past 48 hour(s))  Pregnancy, urine     Status: None   Collection Time: 09/07/15  7:03 AM  Result Value Ref Range   Preg Test, Ur NEGATIVE NEGATIVE    Comment:        THE SENSITIVITY OF THIS METHODOLOGY IS >20 mIU/mL. Performed at [facility]     Physical Findings: AIMS: Facial and Oral Movements Muscles of Facial Expression: None, normal Lips and Perioral Area: None, normal Jaw: None, normal Tongue: None, normal,Extremity Movements Upper (arms, wrists, hands, fingers): None, normal Lower (legs, knees, ankles, toes): None, normal, Trunk Movements Neck, shoulders, hips: None, normal, Overall Severity Severity of abnormal movements (highest score from questions above): None, normal Incapacitation due to abnormal movements: None, normal Patient's awareness of abnormal movements (rate only patient's report): No Awareness, Dental Status Current problems with teeth and/or dentures?: No Does patient usually wear dentures?: No  CIWA:  CIWA-Ar Total: 2 COWS:  COWS Total Score: 2  Musculoskeletal: Strength & Muscle Tone: within normal limits Gait & Station: normal Patient leans: N/A  Psychiatric Specialty Exam: ROS no fever, no chills, no vomiting, no chest pain, no shortness of breath  Blood pressure 114/69, pulse 84, temperature 97.9 F (36.6 C), temperature source Oral, resp. rate 20, height 5' 5.5" (1.664 m), weight 142 lb (64.411 kg), last menstrual period 08/30/2015, SpO2 99 %.Body mass index is 23.26 kg/(m^2).  General Appearance: Well Groomed  Engineer, water::  Good  Speech:  Normal Rate  Volume:  Normal  Mood:  Depressed  Affect:  still constricted, but  improved compared to admission  Thought Process:  Linear  Orientation:  Full (Time, Place, and Person)  Thought Content:  denies hallucinations, no delusions, not internally preoccupied   Suicidal Thoughts:  No at this time denies any plan or intention of hurting self and contracts for safety on unit   Homicidal Thoughts:  No  Memory:  recent and remote grossly intact   Judgement:  Good  Insight:  Good  Psychomotor Activity:  Normal  Concentration:  Good  Recall:  Good  Fund of Knowledge:Good  Language: Good  Akathisia:  Negative  Handed:  Right  AIMS (if indicated):     Assets:  Communication Skills Desire for Improvement Physical Health Resilience Social  Support  ADL's:  Intact  Cognition: WNL  Sleep:  Number of Hours: 6.25  Assessment- patient remains depressed, but has improved compared to admission. She is presenting with a somewhat improved range of affect. She is not suicidal . She has a history of psychosis during prior mood episodes but on this occasion is not presenting with  any psychosis. She is tolerating medications well thus far and we have reviewed rationale and side effect profiles .  Treatment Plan Summary: Daily contact with patient to assess and evaluate symptoms and progress in treatment, Medication management, Plan inpatient admission and medications as below   Increase Seroquel to 200 mgrs QHS to address depression, mood disorder Continue Lamictal 25 mgrs BID for bipolar depression Continue Vistaril 50 mgrs Q 6 hours PRN severe anxiety, as needed Patient is interested in going to [facility] level of care after discharge . Continue to encourage milieu , group participation to address  Mood symptoms, improve coping skills .   COBOS, [place] 09/07/2015, 4:25 PM

## 2015-09-08 NOTE — Progress Notes (Signed)
Patient ID: Lisa [name], female   DOB: 12-22-1990, 24 y.o.   MRN: [id number] [name] MD Progress Note  09/08/2015 11:46 AM Lisa [name]  MRN:  [id number] Subjective:     Reports partial improvement , but still is depressed , sad . Ruminates about having a chronic mental illness and how this has affected her ability to go to college, be fully independent, and how  Acquaintances and friends treat her .  Denies any suicidal ideations. Of note, denies any medication side effects thus far , and has tolerated Seroquel titration well . Denies any rash.  Objective : I have discussed case with treatment team and have met with patient. Patient has been visible on unit, cooperative with staff, no agitated or disorganized behaviors, no symptoms of psychosis. As noted, continues to present with some depression , but is better than upon admission. She is not suicidal and does not endorse any severe neuro-vegetative symptoms of depression at this time. She is tolerating medications well. She has good insight into her illness and into the importance of medication /outpatient treatment compliance . She is responsive to support, empathy, and review of coping skills , and strategies to manage chronic mental illness . Compliance with medications, avoiding illicit drugs, excessive alcohol, and realizing that it is a manageable condition which does not necessarily preclude  Having a productive/ a fulfilling life reviewed .  Affect improved during session. Pregnancy test negative    Principal Problem: Bipolar I disorder, most recent episode depressed ([place]) Diagnosis:   Patient Active Problem List   Diagnosis Date Noted  . Schizoaffective disorder, bipolar type ([place]) [F25.0] 09/06/2015  . Bipolar I disorder, most recent episode depressed ([place]) [F31.30] 09/06/2015  . Paranoia ([place]) [F22] 06/08/2015  . Hyperprolactinemia ([place]) [E22.1] 05/17/2015  . Bipolar disorder, current episode manic severe with psychotic features  ([place]) [F31.2] 05/13/2015   Total Time spent with patient: 25 minutes     Past Medical History:  Past Medical History  Diagnosis Date  . Anxiety   . Depression   . Bipolar disorder [facility])     Past Surgical History  Procedure Laterality Date  . Extraction of wisdom teeth     Family History:  Family History  Problem Relation Age of Onset  . Schizophrenia Mother   . Depression Father   . Seizures Sister     Social History:  History  Alcohol Use  . Yes    Comment: last drink 2 weeks ago, usually 3 drinks weekly     History  Drug Use  . Yes  . Special: Marijuana    Comment: THC last used 2 yrs ago, unsure of amount    Social History   Social History  . Marital Status: Single    Spouse Name: N/A  . Number of Children: N/A  . Years of Education: N/A   Social History Main Topics  . Smoking status: Former Smoker -- 0.25 packs/day for .5 years    Types: Cigarettes    Quit date: 09/03/2015  . Smokeless tobacco: None  . Alcohol Use: Yes     Comment: last drink 2 weeks ago, usually 3 drinks weekly  . Drug Use: Yes    Special: Marijuana     Comment: THC last used 2 yrs ago, unsure of amount  . Sexual Activity: No     Comment: Plan B Pill   Other Topics Concern  . None   Social History Narrative   Additional Social History:    Pain Medications:  tylenol Prescriptions: seroquel 100 mg   lamictal 25 mg   abilify 10 mg   propanolol    vistaril Over the Counter: tylenol History of alcohol / drug use?: Yes Longest period of sobriety (when/how long): unsure Negative Consequences of Use: Financial, Personal relationships, Work / Youth worker Withdrawal Symptoms: Other (Comment) (anxiety, depression) Name of Substance 1: THC 1 - Age of First Use: 24 yrs old 1 - Amount (size/oz): unsure 1 - Frequency: 2 weeks ago 1 - Duration: usually every 2-3 weeks 1 - Last Use / Amount: unsure  Sleep: Good  Appetite:  Improved   Current Medications: Current Facility-Administered  Medications  Medication Dose Route Frequency Provider Last Rate Last Dose  . acetaminophen (TYLENOL) tablet 650 mg  650 mg Oral Q6H PRN Niel Hummer, NP      . alum & mag hydroxide-simeth (MAALOX/MYLANTA) 200-200-20 MG/5ML suspension 30 mL  30 mL Oral Q4H PRN Niel Hummer, NP      . hydrOXYzine (ATARAX/VISTARIL) tablet 50 mg  50 mg Oral Q6H PRN Niel Hummer, NP   50 mg at 09/07/15 1302  . lamoTRIgine (LAMICTAL) tablet 25 mg  25 mg Oral BID Jenne Campus, MD   25 mg at 09/08/15 0815  . magnesium hydroxide (MILK OF MAGNESIA) suspension 30 mL  30 mL Oral Daily PRN Niel Hummer, NP      . QUEtiapine (SEROQUEL) tablet 200 mg  200 mg Oral QHS Jenne Campus, MD   200 mg at 09/07/15 2103  . vitamin C (ASCORBIC ACID) tablet 500 mg  500 mg Oral Daily Niel Hummer, NP   500 mg at 09/08/15 0815  . Vitamin-B Complex TABS 1 tablet  1 tablet Oral QODAY Niel Hummer, NP   1 tablet at 09/08/15 1034    Lab Results:  Results for orders placed or performed during the hospital encounter of 09/06/15 (from the past 48 hour(s))  Pregnancy, urine     Status: None   Collection Time: 09/07/15  7:03 AM  Result Value Ref Range   Preg Test, Ur NEGATIVE NEGATIVE    Comment:        THE SENSITIVITY OF THIS METHODOLOGY IS >20 mIU/mL. Performed at [facility]     Physical Findings: AIMS: Facial and Oral Movements Muscles of Facial Expression: None, normal Lips and Perioral Area: None, normal Jaw: None, normal Tongue: None, normal,Extremity Movements Upper (arms, wrists, hands, fingers): None, normal Lower (legs, knees, ankles, toes): None, normal, Trunk Movements Neck, shoulders, hips: None, normal, Overall Severity Severity of abnormal movements (highest score from questions above): None, normal Incapacitation due to abnormal movements: None, normal Patient's awareness of abnormal movements (rate only patient's report): No Awareness, Dental Status Current problems with teeth and/or  dentures?: No Does patient usually wear dentures?: No  CIWA:  CIWA-Ar Total: 2 COWS:  COWS Total Score: 2  Musculoskeletal: Strength & Muscle Tone: within normal limits Gait & Station: normal Patient leans: N/A  Psychiatric Specialty Exam: ROS no fever, no chills, no vomiting, no chest pain, no shortness of breath  Blood pressure 102/63, pulse 116, temperature 98 F (36.7 C), temperature source Oral, resp. rate 16, height 5' 5.5" (1.664 m), weight 142 lb (64.411 kg), last menstrual period 08/30/2015, SpO2 99 %.Body mass index is 23.26 kg/(m^2).  General Appearance: Well Groomed  Eye Contact::  Good  Speech:  Normal Rate  Volume:  Normal  Mood:  Depressed, some improvement compared to admission  Affect:  still constricted, but improved compared to admission  Thought Process:  Linear  Orientation:  Full (Time, Place, and Person)  Thought Content:  denies hallucinations, no delusions, not internally preoccupied - ruminative about how mental illness has affected college, friendships   Suicidal Thoughts:  No at this time denies any plan or intention of hurting self and contracts for safety on unit   Homicidal Thoughts:  No  Memory:  recent and remote grossly intact   Judgement:  Good  Insight:  Good  Psychomotor Activity:  Normal  Concentration:  Good  Recall:  Good  Fund of Knowledge:Good  Language: Good  Akathisia:  Negative  Handed:  Right  AIMS (if indicated):     Assets:  Communication Skills Desire for Improvement Physical Health Resilience Social Support  ADL's:  Intact  Cognition: WNL  Sleep:  Number of Hours: 6.25  Assessment- patient remains depressed, ruminative, but improved compared to admission symptoms. She is future oriented, and is interested in going to [facility] after discharge and  Motivated in continuing college to eventually become Education officer, museum . She is tolerating Seroquel/ Lamictal combination well thus far . Although depressed, neuro-vegetative symptoms  have been improving gradually and is now more visible on unit. As noted, she is not presenting with any current symptoms of psychosis.   Treatment Plan Summary: Daily contact with patient to assess and evaluate symptoms and progress in treatment, Medication management, Plan inpatient admission and medications as below   Continue Seroquel  200 mgrs QHS to address depression, mood disorder Continue Lamictal 25 mgrs BID for bipolar depression Continue Vistaril 50 mgrs Q 6 hours PRN severe anxiety, as needed Patient is interested in going to [facility] level of care after discharge . Continue to encourage milieu , group participation to address  Mood symptoms, improve coping skills .   COBOS, [place]ERNAND[place] 09/08/2015, 11:46 AM

## 2015-09-08 NOTE — Progress Notes (Signed)
Patient ID: Lisa [name], female   DOB: Mar 18, 1991, 24 y.o.   MRN: [id number]   Pt currently presents with a flat affect and depressed behavior. Per self inventory, pt rates depression at a 3, hopelessness 1 and anxiety 3. Pt's daily goal is to "work on coming up with coping skills and goals for when I get out " and they intend to do so by "write down my goal." Pt reports good sleep, a fair appetite, normal energy and good concentration. Pt attends groups today. Pt remains in her bed any other time today.   Pt provided with medications per providers orders. Pt's labs and vitals were monitored throughout the day. Pt supported emotionally and encouraged to express concerns and questions. Pt educated on medications.  Pt's safety ensured with 15 minute and environmental checks. Pt currently denies SI/HI and A/V hallucinations. Pt verbally agrees to seek staff if SI/HI or A/VH occurs and to consult with staff before acting on these thoughts. Will continue POC.

## 2015-09-08 NOTE — [facility] Group Notes (Signed)
[facility] [employer] Group Therapy 09/08/2015 1:15pm  Type of Therapy: Mental Health Association Presentation  Participation Level: Active  Participation Quality: Attentive  Affect: Appropriate  Cognitive: Oriented  Insight: Developing/Improving  Engagement in Therapy: Engaged  Modes of Intervention: Discussion, Education and Socialization  Summary of Progress/Problems: Mental Health Association (MHA) Speaker came to talk about his personal journey with substance abuse and addiction. The pt processed ways by which to relate to the speaker. MHA speaker provided handouts and educational information pertaining to groups and services offered by the [facility]. Pt was engaged in speaker's presentation and was receptive to resources provided.    Chad Cordial, LCSWA 09/08/2015 2:00 PM

## 2015-09-08 NOTE — Progress Notes (Signed)
D: Pt presents flat in affect and depressed in mood. Pt is visible within the milieu but with minimal interactions with others. Pt is currently denying any suicidal ideation. Pt complains of a headache of a 5 out of 10 with 10 being the most severe.  Pt in attendance for karaoke. Pt had no further concerns she wished to address with this writer at this time. A: Writer administered scheduled and prn medications to pt, per MD orders. Continued support and availability as needed was extended to this pt. Staff continue to monitor pt with q43min checks.  R: No adverse drug reactions noted. Pt receptive to treatment. Pt remains safe at this time.

## 2015-09-08 NOTE — Tx Team (Signed)
Interdisciplinary Treatment Plan Update (Adult)  Date:  09/08/2015 Time Reviewed:  2:00 PM  Progress in Treatment: Attending groups: Yes. Participating in groups: Yes. Taking medication as prescribed:  Yes. Tolerating medication:  Yes. Family/Significant othe contact made:  Yes, contacted Lisabeth Register (mother) [phone]  Patient understands diagnosis:  Yes, as evidenced by seeking help with suicidal ideation. Discussing patient identified problems/goals with staff:  Yes, see initial care plan. Medical problems stabilized or resolved:  Yes Denies suicidal/homicidal ideation: Yes. Issues/concerns per patient self-inventory: No. Other:  New problem(s) identified:   Discharge Plan or Barriers: Pt will return home, follow up outpt   Reason for Continuation of Hospitalization: Depression Suicidal ideation  Comments: 24 year old single female, known to our unit from prior psychiatric admissions , most recently in July.  She has been diagnosed with Bipolar Disorder in the past.  She states she has been feeling very depressed recently, particularly over the last week. She cannot identify any particular stressor, although she did recently broke up with boyfriend 5 days ago ( was feeling depressed even before then) , and states she had smoked cannabis on several occasions recently. States " I don't think I have ever been so depressed ". She states she thought about enrolling in [facility], but was encouraged to seek inpatient treatment as she had  Developed Some suicidal ideations Of overdosing on medications, but states that she did not attempt . Reports neuro- vegetative symptoms as below, and states she has felt very tired, " sluggish".  Patient has a history of psychotic symptoms during mood episodes in the past, but at this time has no symptoms of psychosis. Seroquel  Estimated length of stay: 1 day  New goal(s):  Review of initial/current patient goals per problem list:  1.  Goal(s): Patient will participate in aftercare plan  Met:Yes  Target date: at discharge  As evidenced by: Patient will participate within aftercare plan AEB aftercare provider and housing plan at discharge being identified. 09/07/15: Pt is agreeable to PHP and continuing to see Dr. Darleene Cleaver. However, no scheduled apt at this time. 09/08/15: Pt will start PHP on Friday; will return home  2. Goal (s): Patient will exhibit decreased depressive symptoms and suicidal ideations.  Met:Yes  Target date: at discharge  As evidenced by: Patient will utilize self rating of depression at 3 or below and demonstrate decreased signs of depression or be deemed stable for discharge by MD. 09/07/15: Pt denies depression and SI at this time. Will continue to assess. Pt rates depression at 2/10     Attendees: Patient:  09/08/2015 2:00 PM  Family:   09/08/2015 2:00 PM  Physician:  Dr. Neita Garnet, MD 09/08/2015 2:00 PM  Nursing: Marcella Dubs, RN 09/08/2015 2:00 PM  Case Manager:  Peri Maris, [place] 09/08/2015 2:00 PM  Counselor:  Matthew Saras, MSW Intern 09/08/2015 2:00 PM  Other:   09/08/2015 2:00 PM  Other:   09/08/2015 2:00 PM  Other:   09/08/2015 2:00 PM  Other:  09/08/2015 2:00 PM  Other:    Other:    Other:    Other:    Other:    Other:      Scribe for Treatment Team:   Georga Kaufmann, MSW Intern 09/08/2015 2:00 PM

## 2015-09-08 NOTE — Progress Notes (Signed)
Patient ID: Lisa [name], female   DOB: 1990-12-31, 24 y.o.   MRN: [id number]  Adult Psychoeducational Group Note  Date:  09/08/2015 Time:  09:190am  Group Topic/Focus:  Orientation:   The focus of this group is to educate the patient on the purpose and policies of crisis stabilization and provide a format to answer questions about their admission.  The group details unit policies and expectations of patients while admitted.  Participation Level:  Did Not Attend  Participation Quality: n/a  Affect:  n/a  Cognitive: n/a  Insight: n/a  Engagement in Group:  n/a  Modes of Intervention:  Education, Orientation and Support  Additional Comments:  Pt did not attend group. Pt in bed asleep.   Aurora Mask 09/08/2015, 10:25 AM

## 2015-09-09 MED ORDER — FERROUS SULFATE 325 (65 FE) MG PO TABS: 325.0000 mg | ORAL_TABLET | ORAL | Status: DC

## 2015-09-09 MED ORDER — SERTRALINE HCL 25 MG PO TABS: 25.0000 mg | ORAL_TABLET | Freq: Every day | ORAL | Status: DC

## 2015-09-09 MED ORDER — LAMOTRIGINE 25 MG PO TABS: 25.0000 mg | ORAL_TABLET | Freq: Two times a day (BID) | ORAL | Status: DC

## 2015-09-09 MED ORDER — SERTRALINE HCL 25 MG PO TABS
25.0000 mg | ORAL_TABLET | Freq: Every day | ORAL | Status: DC
  Administered 2015-09-09: 25 mg via ORAL
  Filled 2015-09-09 (×3): qty 1

## 2015-09-09 MED ORDER — QUETIAPINE FUMARATE 200 MG PO TABS: 200.0000 mg | ORAL_TABLET | Freq: Every day | ORAL | Status: DC

## 2015-09-09 MED ORDER — VITAMIN-B COMPLEX PO TABS: 1.0000 | ORAL_TABLET | ORAL | Status: DC

## 2015-09-09 MED ORDER — VITAMIN C 500 MG PO TABS: 500.0000 mg | ORAL_TABLET | Freq: Every day | ORAL | Status: DC

## 2015-09-09 NOTE — BHH Suicide Risk Assessment (Signed)
[facility] Discharge Suicide Risk Assessment   Demographic Factors:  24 year old single female,  No children, lives with parents   Total Time spent with patient: 30 minutes  Musculoskeletal: Strength & Muscle Tone: within normal limits Gait & Station: normal Patient leans: N/A  Psychiatric Specialty Exam: Physical Exam  ROS  Blood pressure 91/61, pulse 115, temperature 97.2 F (36.2 C), temperature source Oral, resp. rate 16, height 5' 5.5" (1.664 m), weight 142 lb (64.411 kg), last menstrual period 08/30/2015, SpO2 99 %.Body mass index is 23.26 kg/(m^2).  General Appearance: improved grooming   Eye Contact::  Good  Speech:  Normal Rate409  Volume:  Normal  Mood:  still depressed but better than upon admission  Affect:  less constricted, more reactive   Thought Process:  Goal Directed and Linear  Orientation:  Full (Time, Place, and Person)  Thought Content:  no hallucinations, no delusions, not internally preoccupied   Suicidal Thoughts:  No- at this time denies any suicidal ideations or any self injurious thoughts   Homicidal Thoughts:  No  Memory:  recent and remote grossly intact   Judgement:  Other:  improved   Insight:  Present  Psychomotor Activity:  Normal  Concentration:  Good  Recall:  Good  Fund of Knowledge:Good  Language: Good  Akathisia:  Negative  Handed:  Right  AIMS (if indicated):     Assets:  Communication Skills Desire for Improvement Housing Physical Health Resilience Social Support  Sleep:  Number of Hours: 6.25  Cognition: WNL  ADL's:   Improved    Have you used any form of tobacco in the last 30 days? (Cigarettes, Smokeless Tobacco, Cigars, and/or Pipes): Yes  Has this patient used any form of tobacco in the last 30 days? (Cigarettes, Smokeless Tobacco, Cigars, and/or Pipes) Yes, A prescription for an FDA-approved tobacco cessation medication was offered at discharge and the patient refused  Mental Status Per Nursing Assessment::   On Admission:   NA  Current Mental Status by Physician: At this time patient is partially but significantly improved compared to her admission presentation. She is still depressed, but affect is less constricted and more reactive, no longer tearful, no thought disorder, no SI, no HI, no psychotic symptoms, future oriented, wants to initiate  PHP participation early next week .   Loss Factors: Chronic mental illness, which has affected education , work , relationships , recent  Break up with BF  Historical Factors: prior psychiatric admissions  - history of Biopolar Disorder, with prior history of psychotic symptoms   Risk Reduction Factors:   Sense of responsibility to family, Living with another person, especially a relative, Positive social support and Positive coping skills or problem solving skills  Continued Clinical Symptoms:  As noted, currently significantly improved compared to admission, mood improved - less depressed, affect more reactive, no psychotic symptoms, no SI or HI, future oriented. With patient's express consent and in her presence I contacted mother- who corroborates that patient seems to be improving, that she can return home, and who is in agreement with discharge today. * Of note, patient interested in Zoloft trial , based on ongoing depression in spite of Seroquel/Lamictal, and because father has responded very well to Zoloft for management of his depression/mood symptoms- we discussed side effect profile, risks versus benefits . Start Low dose - 25 mgrs QDAY initially  Cognitive Features That Contribute To Risk:  No gross cognitive deficits noted upon discharge. Is alert , attentive, and oriented x 3  Suicide Risk:  Mild:  Suicidal ideation of limited frequency, intensity, duration, and specificity.  There are no identifiable plans, no associated intent, mild dysphoria and related symptoms, good self-control (both objective and subjective assessment), few other risk factors, and  identifiable protective factors, including available and accessible social support.  Principal Problem: Bipolar I disorder, most recent episode depressed [facility]) Discharge Diagnoses:  Patient Active Problem List   Diagnosis Date Noted  . Schizoaffective disorder, bipolar type (HCC) [F25.0] 09/06/2015  . Bipolar I disorder, most recent episode depressed (HCC) [F31.30] 09/06/2015  . Paranoia (HCC) [F22] 06/08/2015  . Hyperprolactinemia (HCC) [E22.1] 05/17/2015  . Bipolar disorder, current episode manic severe with psychotic features (HCC) [F31.2] 05/13/2015    Follow-up Information    Follow up with [facility]GRAM On 09/13/2015.   Specialty:  Behavioral Health   Why:  at 10:00am to complete paperwork. You will start programming on 10/12   Contact information:   [address] [id number] mc [address] [place] [address] [phone]      Plan Of Care/Follow-up recommendations:  Activity:  as tolerated Diet:  Regular  Tests:  NA  Other:  See below   Is patient on multiple antipsychotic therapies at discharge:   No  Do you recommend tapering to monotherapy for antipsychotics?  No   Has Patient had three or more failed trials of antipsychotic monotherapy by history:  No  Recommended Plan for Multiple Antipsychotic Therapies: NA  Patient is leaving unit in good spirits. She is planning on returning home. She plans to start PHP early next week. Encouraged to abstain completely from cannabis or any illicit drugs .   Santos Hardwick 09/09/2015, 1:36 PM

## 2015-09-09 NOTE — Progress Notes (Signed)
Lisa [name] is readied for her DC as the MD  Completed her DC order and her DC SRA. SHe  Completed hr daily assessment and on it she wrote she denied having SI today and she rated her depressiion, hopelessness and anxiety " 3/2/4, respectively. She was given her AVS, its contents were reviewed with her and she stated verbal understanding  Of all info reviewed. She was given all blongins from her locker and then she was given rpescriptions for her medicaitons. . She was taken downstairs to the OP dept, where she stated the physician  Old her to come " to finish my registration". Pt is dc'd.t

## 2015-09-09 NOTE — Progress Notes (Signed)
Recreation Therapy Notes  Date: 10.07.2016 Time: 9:30am Location: [address] Group room   Group Topic: Stress Management  Goal Area(s) Addresses:  Patient will actively participate in stress management techniques presented during session.   Behavioral Response: Did not attend.   Marykay Lex Maki Sweetser, LRT/CTRS        Nicasio Barlowe L 09/09/2015 10:08 AM

## 2015-09-09 NOTE — Progress Notes (Signed)
  [facility] Adult Case Management Discharge Plan :  Will you be returning to the same living situation after discharge:  Yes,  Pt returning to her parent's home At discharge, do you have transportation home?: Yes,  Pt has her own car at the hospital. Do you have the ability to pay for your medications: Yes,  Pt given prescriptions  Release of information consent forms completed and in the chart;  Patient's signature needed at discharge.  Patient to Follow up at: Follow-up Information    Follow up with [facility]PITAL[facility]GRAM On 09/13/2015.   Specialty:  Behavioral Health   Why:  at 10:00am to complete paperwork. You will start programming on 10/12   Contact information:   [address] [id number] mc [address] [place] [address] [phone]      Patient denies SI/HI: Yes,  Pt denies    Safety Planning and Suicide Prevention discussed: Yes,  with Pt's mother  Have you used any form of tobacco in the last 30 days? (Cigarettes, Smokeless Tobacco, Cigars, and/or Pipes): Yes  Has patient been referred to the Quitline?: Patient refused referral  Elaina Hoops 09/09/2015, 1:10 PM

## 2015-09-09 NOTE — Discharge Summary (Signed)
Physician Discharge Summary Note  Patient:  Lisa [name] is an 24 y.o., female MRN:  [id number] DOB:  May 07, 1991 Patient phone:  [phone] (home)  Patient address:   [address] [facility] [place] [address],  Total Time spent with patient: 30 minutes  Date of Admission:  09/06/2015 Date of Discharge: 09/09/2015  Reason for Admission:  depression  Principal Problem: Bipolar I disorder, most recent episode depressed [facility]) Discharge Diagnoses: Patient Active Problem List   Diagnosis Date Noted  . Schizoaffective disorder, bipolar type (HCC) [F25.0] 09/06/2015  . Bipolar I disorder, most recent episode depressed (HCC) [F31.30] 09/06/2015  . Paranoia (HCC) [F22] 06/08/2015  . Hyperprolactinemia (HCC) [E22.1] 05/17/2015  . Bipolar disorder, current episode manic severe with psychotic features (HCC) [F31.2] 05/13/2015    Musculoskeletal: Strength & Muscle Tone: within normal limits Gait & Station: normal Patient leans: N/A  Psychiatric Specialty Exam:  SEE SRA Physical Exam  Vitals reviewed.   Review of Systems  All other systems reviewed and are negative.   Blood pressure 91/61, pulse 115, temperature 97.2 F (36.2 C), temperature source Oral, resp. rate 16, height 5' 5.5" (1.664 m), weight 64.411 kg (142 lb), last menstrual period 08/30/2015, SpO2 99 %.Body mass index is 23.26 kg/(m^2).  Have you used any form of tobacco in the last 30 days? (Cigarettes, Smokeless Tobacco, Cigars, and/or Pipes): Yes  Has this patient used any form of tobacco in the last 30 days? (Cigarettes, Smokeless Tobacco, Cigars, and/or Pipes) N/A  Past Medical History:  Past Medical History  Diagnosis Date  . Anxiety   . Depression   . Bipolar disorder [facility])     Past Surgical History  Procedure Laterality Date  . Extraction of wisdom teeth     Family History:  Family History  Problem Relation Age of Onset  . Schizophrenia Mother   . Depression Father   . Seizures Sister    Social History:   History  Alcohol Use  . Yes    Comment: last drink 2 weeks ago, usually 3 drinks weekly     History  Drug Use  . Yes  . Special: Marijuana    Comment: THC last used 2 yrs ago, unsure of amount    Social History   Social History  . Marital Status: Single    Spouse Name: N/A  . Number of Children: N/A  . Years of Education: N/A   Social History Main Topics  . Smoking status: Former Smoker -- 0.25 packs/day for .5 years    Types: Cigarettes    Quit date: 09/03/2015  . Smokeless tobacco: None  . Alcohol Use: Yes     Comment: last drink 2 weeks ago, usually 3 drinks weekly  . Drug Use: Yes    Special: Marijuana     Comment: THC last used 2 yrs ago, unsure of amount  . Sexual Activity: No     Comment: Plan B Pill   Other Topics Concern  . None   Social History Narrative   Risk to Self: Suicidal Ideation: Yes-Currently Present Suicidal Intent: Yes-Currently Present Is patient at risk for suicide?: Yes Suicidal Plan?: Yes-Currently Present Specify Current Suicidal Plan: Overdose on medication  Access to Means: Yes Specify Access to Suicidal Means: Prescribed medication or OTC neds What has been your use of drugs/alcohol within the last 12 months?: Pt admits to recent marijauna use. Pt states that she used to drink 3 to 4 drink every night but currently does not drink at all. How many times?:  0 Other Self Harm Risks: None Reported Triggers for Past Attempts:  (NA) Intentional Self Injurious Behavior: None Risk to Others: Homicidal Ideation: No Thoughts of Harm to Others: No Current Homicidal Intent: No Current Homicidal Plan: No Access to Homicidal Means: No Identified Victim: NA History of harm to others?: No Assessment of Violence: None Noted Violent Behavior Description: NA Does patient have access to weapons?: No Criminal Charges Pending?: No Does patient have a court date: No Prior Inpatient Therapy: Prior Inpatient Therapy: Yes Prior Therapy Dates: 2014;  2015;2016 Prior Therapy Facilty[facility]Provider(s): [facility] Reason for Treatment: SI Prior Outpatient Therapy: Prior Outpatient Therapy: Yes Prior Therapy Dates: Ongoing Prior Therapy Facilty/Provider(s): Dr. Jannifer Franklin; PHP at [facility] Outpatient Reason for Treatment: Outpatient Therapy Does patient have an ACCT team?: No Does patient have Intensive In-House Services?  : No Does patient have [facility] services? : No Does patient have P4CC services?: No  Level of Care:  OP  Hospital Course:  Lashaun Poch was admitted for Bipolar I disorder, most recent episode depressed (HCC) and crisis management.  She was treated discharged with the medications listed below under Medication List.  Medical problems were identified and treated as needed.  Home medications were restarted as appropriate.  Improvement was monitored by observation and Skylar Vizcarrondo daily report of symptom reduction.  Emotional and mental status was monitored by daily self-inventory reports completed by [name] and clinical staff.         Hildagard Pillard was evaluated by the treatment team for stability and plans for continued recovery upon discharge.  Jaydalynn Deller motivation was an integral factor for scheduling further treatment.  Employment, transportation, bed availability, health status, family support, and any pending legal issues were also considered during her hospital stay.  She was offered further treatment options upon discharge including but not limited to Residential, Intensive Outpatient, and Outpatient treatment.  Lance Galas will follow up with the services as listed below under Follow Up Information.     Upon completion of this admission the patient was both mentally and medically stable for discharge denying suicidal/homicidal ideation, auditory/visual/tactile hallucinations, delusional thoughts and paranoia.      Consults:  psychiatry  Significant Diagnostic Studies:  labs: per ED  Discharge Vitals:   Blood  pressure 91/61, pulse 115, temperature 97.2 F (36.2 C), temperature source Oral, resp. rate 16, height 5' 5.5" (1.664 m), weight 64.411 kg (142 lb), last menstrual period 08/30/2015, SpO2 99 %. Body mass index is 23.26 kg/(m^2). Lab Results:   Results for orders placed or performed during the hospital encounter of 09/06/15 (from the past 72 hour(s))  Pregnancy, urine     Status: None   Collection Time: 09/07/15  7:03 AM  Result Value Ref Range   Preg Test, Ur NEGATIVE NEGATIVE    Comment:        THE SENSITIVITY OF THIS METHODOLOGY IS >20 mIU/mL. Performed at [facility]     Physical Findings: AIMS: Facial and Oral Movements Muscles of Facial Expression: None, normal Lips and Perioral Area: None, normal Jaw: None, normal Tongue: None, normal,Extremity Movements Upper (arms, wrists, hands, fingers): None, normal Lower (legs, knees, ankles, toes): None, normal, Trunk Movements Neck, shoulders, hips: None, normal, Overall Severity Severity of abnormal movements (highest score from questions above): None, normal Incapacitation due to abnormal movements: None, normal Patient's awareness of abnormal movements (rate only patient's report): No Awareness, Dental Status Current problems with teeth and/or dentures?: No Does patient usually wear dentures?: No  CIWA:  CIWA-Ar Total: 2 COWS:  COWS Total Score: 2   See Psychiatric Specialty Exam and Suicide Risk Assessment completed by Attending Physician prior to discharge.  Discharge destination:  Home  Is patient on multiple antipsychotic therapies at discharge:  No   Has Patient had three or more failed trials of antipsychotic monotherapy by history:  No    Recommended Plan for Multiple Antipsychotic Therapies: NA     Medication List    STOP taking these medications        ARIPiprazole 10 MG tablet  Commonly known as:  ABILIFY     hydrOXYzine 50 MG tablet  Commonly known as:  ATARAX/VISTARIL      propranolol 10 MG tablet  Commonly known as:  INDERAL      TAKE these medications      Indication   ferrous sulfate 325 (65 FE) MG tablet  Take 1 tablet (325 mg total) by mouth See admin instructions. One tablet weekly as needed for weakness   Indication:  Iron Deficiency     lamoTRIgine 25 MG tablet  Commonly known as:  LAMICTAL  Take 1 tablet (25 mg total) by mouth 2 (two) times daily.   Indication:  mood stabilization     QUEtiapine 200 MG tablet  Commonly known as:  SEROQUEL  Take 1 tablet (200 mg total) by mouth at bedtime.   Indication:  Mood Stabilization     vitamin C 500 MG tablet  Commonly known as:  ASCORBIC ACID  Take 1 tablet (500 mg total) by mouth daily.   Indication:  Vitamin deficiency     Vitamin-B Complex Tabs  Take 1 tablet by mouth every other day.   Indication:  Vitamin Deficiency           Follow-up Information    Follow up with BE[facility] HOSPITALIZATION [facility]GRAM On 09/13/2015.   Specialty:  Behavioral Health   Why:  at 10:00am to complete paperwork. You will start programming on 10/12   Contact information:   [address] [id number] mc [address] [place] [address] [phone]      Follow-up recommendations:  Activity:  as tol Diet:  as tol  Comments:  1.  Take all your medications as prescribed.              2.  Report any adverse side effects to outpatient provider.                       3.  Patient instructed to not use alcohol or illegal drugs while on prescription medicines.            4.  In the event of worsening symptoms, instructed patient to call 911, the crisis hotline or go to nearest emergency room for evaluation of symptoms.  Total Discharge Time: 30 min  Signed: Velna Hatchet May Agustin AGNP-BC 09/09/2015, 10:08 AM   Patient seen, Suicide Assessment Completed.  Disposition Plan Reviewed

## 2015-09-09 NOTE — BHH Group Notes (Signed)
[facility] LCSW Aftercare Discharge Planning Group Note  09/09/2015 8:45 AM  Participation Quality: Alert, Appropriate and Oriented  Mood/Affect: Appropriate  Depression Rating: 3  Anxiety Rating: 4  Thoughts of Suicide: Pt denies SI/HI  Will you contract for safety? Yes  Current AVH: Pt denies  Plan for Discharge/Comments: Pt attended discharge planning group and actively participated in group. CSW discussed suicide prevention education with the group and encouraged them to discuss discharge planning and any relevant barriers. Pt reports being ready for discharge and will continue PHP next week.  Transportation Means: Pt reports access to transportation  Supports: Parents  Chad Cordial, Theresia Majors 09/09/2015 9:37 AM

## 2015-09-12 ENCOUNTER — Other Ambulatory Visit (HOSPITAL_COMMUNITY): Payer: BLUE CROSS/BLUE SHIELD | Admitting: Licensed Clinical Social Worker

## 2015-09-12 ENCOUNTER — Telehealth (HOSPITAL_COMMUNITY): Payer: Self-pay | Admitting: Licensed Clinical Social Worker

## 2015-09-12 DIAGNOSIS — F319 Bipolar disorder, unspecified: Secondary | ICD-10-CM | POA: Diagnosis not present

## 2015-09-12 DIAGNOSIS — F1210 Cannabis abuse, uncomplicated: Secondary | ICD-10-CM | POA: Diagnosis not present

## 2015-09-12 DIAGNOSIS — F3130 Bipolar disorder, current episode depressed, mild or moderate severity, unspecified: Secondary | ICD-10-CM

## 2015-09-12 NOTE — Progress Notes (Signed)
09/12/15 Encounter: 11 AM - 1 PM, 1:30 PM - 4 PM  Purpose: Addressing coping skills in recovery. Intervention: Open guided discussion on finding personal meaning in positive coping skills. Effect: Murray concluded that she has set unrealistic expectations for herself that may inhibit personal coping mechanisms & growth.  Donnamarie Rossetti CPSS

## 2015-09-12 NOTE — Progress Notes (Signed)
Adult Psychoeducational Group Note  Date:  09/12/2015 Time:  6:02 PM  Group Topic/Focus: Coping With Mental Health Crisis:   The purpose of this group is to help patients identify strategies for coping with mental health crisis.  Group discusses possible causes of crisis and ways to manage them effectively. Crisis Planning:   The purpose of this group is to help patients create a crisis plan for use upon discharge or in the future, as needed.  Participation Level:  Active  Participation Quality:  Appropriate and Attentive  Affect:  Anxious and Appropriate  Cognitive:  Alert  Insight: Improving  Engagement in Group:  Engaged  Modes of Intervention:  Activity, Clarification, Discussion, Education, Exploration and Problem-solving  Additional Comments:  Patients were involved in an activity and discussion with the peer support specialist who explained the varying levels of coping skills. Patients were taught that coping skills are a method in which to deal with stressful situations. Coping skills were explained as either being constructive, non-coping mechanisms and simply developing and creating positive coping techniques from past or present stressful experiences. Therapist explained that coping skills can be either positive or negative. Therapist probed for real-life examples from each patient and asked about how they were able to develop specific coping skills to address their situation. Patients were again informed that developing good, positive, effective coping skills takes practice. Therapist monitored each patient's response and assessed for difficulty with problem solving skills. Therapist provided patients with unconditional positive praise when they were able to identify effective solutions to stressful events.    [name] appears much more stable that when she initially presented for intake last week. She was able to sit for an extended period of time and actively participate in  discussions. She was able to offer feedback and to effectively evaluate her situation without becoming anxious.    Lisa [name] 09/12/2015, 6:02 PM

## 2015-09-12 NOTE — Psych (Signed)
   Lisa [name] BH PHP THERAPIST PROGRESS NOTE  Lisa [name] [id number]   Date: 09/12/15  Time: 1:00 PM-2:00 PM    Group Topic/Focus:  Group led by Michelle Nasuti, pharmacist, on medication education. Also group related to discussion of emotional regulation worksheet titled "Recognizing Your Emotions"  Participation Level:  Minimal  Participation Quality:  Attentive  Affect:  Depressed  Cognitive:  Appropriate  Insight: Limited  Engagement in Group:  Engaged  Modes of Intervention:  Discussion, Education and Exploration, DBT Skills  Additional Comments: Group was led by pharmacist Michelle Nasuti who discussed medication education and therapist was also present. Topic also included application of Emotional regulation worksheet titiled "Recognizing You Emotions" worksheet. Patients applied the worksheet to [name] specific situation in their life and learned that it helps to ask themselves specific questions to identify the emotion when the situation occurs.  During medication education segment topic included that it takes [name] little bit longer for medications that address both anxiety and depression to work for anxiety. It takes about 4-6 weeks for the anxiety to improve and little bit less time for the depression to improve. She described the process of improvement as feeling [name] little bit better in the day and that as one is on the medication longer one feels better for longer periods of time. She also said she is in favor of medications that produce chemicals as opposed to ones that suppress them. She explains that the problem is that once the chemical wears off, that one is back to the same problem and has not applied any coping skills. She said the same thing happens when one uses [name] chemical like marijuana and alcohol. This was helpful for patients to understand the problems with using substances to cope with emotions. This was patient's first day of group so therapist worked on Electronics engineer and helping her feel  integrated into the group by asking her questions so she could share more about herself in the group. Patient's behavior and input indicates that she is beginning to feel comfortable with the group.      Plan: 1.Patient continue to use group to educate her on managing her symptoms effectively.    Diagnosis: Primary Diagnosis: Bipolar I disorder, most recent episode depressed (HCC) [F31.30]    1. Bipolar I disorder, most recent episode depressed (HCC)       Lisa [name] 09/12/2015

## 2015-09-13 ENCOUNTER — Other Ambulatory Visit (HOSPITAL_COMMUNITY): Payer: BLUE CROSS/BLUE SHIELD | Admitting: Psychiatry

## 2015-09-13 DIAGNOSIS — F319 Bipolar disorder, unspecified: Secondary | ICD-10-CM | POA: Diagnosis not present

## 2015-09-13 DIAGNOSIS — F3130 Bipolar disorder, current episode depressed, mild or moderate severity, unspecified: Secondary | ICD-10-CM

## 2015-09-13 NOTE — Progress Notes (Signed)
09/13/15 Encounter: 12:30 - 1:30 PM, 1:30 - 4 PM  Purpose: Addressing "black & white thinking" in recovery. Intervention: Open guided discussion on resoling emotional polarities as peers. Effect: Jelicia acknowledged the need to reconcile her emotional depressive & manic thought processes through gaining perspective on her past.  Donnamarie Rossetti CPSS

## 2015-09-13 NOTE — Psych (Signed)
[facility] BH PHP THERAPIST PROGRESS NOTE  Lisa [name] [id number]   Date: 09/13/15   Time: 11:00 AM-12:00 PM  Group Topic/Focus:  Group topic related self-esteem issues and lack of support issues.  Participation Level:  Active  Participation Quality:  Attentive and Sharing  Affect:  Appropriate  Cognitive:  Appropriate  Insight: Limited  Engagement in Group:  Engaged  Modes of Intervention:  Discussion, Education, Exploration, Support and skills to build self-esteem and develop support  Additional Comments: Check in provided topic of group discussion. Group members discussed how their sense of value has decreased because they are not working of going to school. They also question their ability to perform in these activities again now that they have not been participating in them. There was also the anxiety related to not having anything to do, looking for ways to keep busier and the discontent related to going home often to parents as well as a feeling of lack of meaning and purpose to their lives.  Patients also discussed their lack of support due to not being involved in many activities. Therapist said that the group offered support through other group members who felt similarly. Discussion offered suggestions of different coping strategies. Patients discussed different ways they could find support. Therapist pointed out that value comes internally and not from an external source and that we all have the same unconditional worth. Patient related to topic of the group and therapist encouraged her to look for internal sources for self-worth. Patient thought about taking small steps to re-engage in activities such as volunteer work.   Plan:1.Lisa [name] gain increase in self-esteem though insight gained in group and take steps to engage in positive activities that will help increase self-esteem.   [facility] BH PHP THERAPIST GROUP NOTE  Date: 09/13/15  Time: 12:30 PM-1:30 PM   Group Topic/Focus:   Education Group on Panic Disorder  Participation Level:  Minimal  Participation Quality:  Attentive  Affect:  Appropriate  Cognitive:  Appropriate  Insight: Limited  Engagement in Group:  Engaged  Modes of Intervention:  Activity, Discussion, Education and Coping Skills for anxiety/panic  Additional Comments: Group topic related to worksheet that discussed how panic attacks happen, how does panic attack develop and how is panic disorder maintained.  Peer specialist was also present to help in the discussion. Therapist discussed how there are internal and external triggers and there is misinterpretation of the trigger. Peer specialist presented model of a panic attack and discussed how patients have to work up from the fight/flight mode, not let their emotions get them off track and allow themselves to get to their thinking and rational part of the brain to manage the trigger. Patient participated appropriately by respectively listening during group discussion.  Plan: 1.Patient gain insight about strategies to address mental health issues that she can apply to her own symptoms.   [facility] BH PHP THERAPIST GROUP NOTE  Date: 09/13/15 Time: 2:30 PM-4:00 PM   Group Topic/Focus:  Therapist present while group led by peer specialist on "Black and white thinking or splitting"   Participation Level:  Active  Participation Quality:  Attentive and Sharing  Affect:  Appropriate  Cognitive:  Appropriate  Insight: Limited  Engagement in Group:  Engaged  Modes of Intervention:  Education, Exploration and Coping Skills  Additional Comments: Group was led by peer specialist on topic of black and white thinking and therapist was present and participated as well.  Patients learned that in splitting we fail to bring together  our positive and negative qualities into a realistic whole.  None of [place] are good or bad and a more realistic view is to these extremes into a middle ground.  Patients  discussed that with the tendency toward black and white thinking their emotions can increase emotions and get out of hand. For example, when they believe that things will always be a certain way and things are permanent and never change.  Patients discussed ways to manage black and white thinking including set aside your emotions temporarily and try to think more positively. They learned to challenge the thought and reflect whether they are using logic or emotion? They also learned they can reframe the thought and ask how they can benefit from this negative situation? Lisa [name] discussed that she needs to address balancing her extremes of mood. She also has to work to forgive herself for the past, and give herself permission to make mistakes and recognize the assets of today and learn form her past.   Plan: 1.Lisa [name] work on challenging her negative perspectives that will help her forgive herself for the past and start to become more aware of the opportunities of the present.   Diagnosis: Primary Diagnosis: Bipolar I disorder, most recent episode depressed (HCC) [F31.30]    1. Bipolar I disorder, most recent episode depressed (HCC)       [facility] 09/13/2015

## 2015-09-14 ENCOUNTER — Other Ambulatory Visit (HOSPITAL_COMMUNITY): Payer: BLUE CROSS/BLUE SHIELD | Admitting: Licensed Clinical Social Worker

## 2015-09-14 DIAGNOSIS — F319 Bipolar disorder, unspecified: Secondary | ICD-10-CM | POA: Diagnosis not present

## 2015-09-14 DIAGNOSIS — F3130 Bipolar disorder, current episode depressed, mild or moderate severity, unspecified: Secondary | ICD-10-CM

## 2015-09-14 NOTE — Progress Notes (Signed)
Adult Psychoeducational Group Note  Date:  09/14/2015 Time:  5:36 PM  Group Topic/Focus: Building Self Esteem:   The Focus of this group is helping patients become aware of the effects of self-esteem on their lives, the things they and others do that enhance or undermine their self-esteem, seeing the relationship between their level of self-esteem and the choices they make and learning ways to enhance self-esteem.  Participation Level:  Minimal  Participation Quality:  Appropriate and Sharing  Affect:  Anxious  Cognitive:  Alert  Insight: Improving  Engagement in Group:  Engaged  Modes of Intervention:  Activity, Discussion, Exploration, Rapport Building, Socialization and Support  Additional Comments:   Patients were asked to discuss a history of feeling inadequate compared with others dating back to childhood. Many of the patients gave a self-description which included self-critical statements, lack of confidence in abilities, and social withdrawal. Therapist was supported each patient as they shared experiences from their family-of-origin history that have caused feelings of low self-esteem.  Therapist explored statements based on low self-esteem within the session.  Therapist assessed for statements that were self-disparaging, indicated feelings of low self-esteem, lack self- confidence, and placed the patient in a situation in which they were vulnerable to depression. Therapist actively listed and helped patients to reframe negative perceptions of themselves.  Therapist supported patients as they revealed experiences with critical and rejecting family members, friends, neighbors, co-workers, classmates and strangers that led to feelings of low self-esteem. Mckenleigh Tarlton 09/14/2015, 5:36 PM

## 2015-09-14 NOTE — Psych (Signed)
[facility] BH PHP THERAPIST PROGRESS NOTE  Lisa [name] [id number]   Date: 09/14/15  Time: 11:00 AM-12:00 PM  Group Topic/Focus:  The focus of this group is to explore various ways to relieve stress in a positive manner. Patients also discussed some of their long-term goals.  Participation Level:  Active  Participation Quality:  Attentive and Sharing  Affect:  Appropriate  Cognitive:  Appropriate  Insight: Improving  Engagement in Group:  Engaged  Modes of Intervention:  Discussion, Education, Exploration and Coping skills  Additional Comments: The focus of the group was to explore various ways to relieve stress in a positive manner. Group started with a check in. Some of the strategies that group members identified were volunteer work, walking, joining a club, gratitude, relax, playing music, and learn how to relax. Patients also discussed some of their long-term goals such as careers that they are thinking of pursuing. People who use positive strategies are better able to tackle challenges and bounce back from tough times, and are much happier. To find the best coping strategies for you, list the types of situations that you find difficult to manage.  When the stressful situations arise, try out one of your strategies. Keep notes on how it went - things that worked, or didn't. You'll soon work out which strategies work well for you, and which situations favor certain strategies over others. Therapist also pointed out that motivation for your life and recovery goals come from dreams. One way to keep your motivation is to remind yourself what all the work is for and that is why it is important to discuss goals. Lisa [name] discussed that she was thinking of volunteer work for the elderly and was exploring a possible group to be able to feel less isolated and keeping her more active in her daily life.   Plan: 1.Patient continue to take positive steps to implement positive coping strategies to help  in improving mood and self-esteem.  [facility] BH PHP THERAPIST GROUP NOTE  Date: 09/14/15  Time: 12:30 PM-2:00 PM    Group Topic/Focus:  The focus of the group was learning the value of relaxation techniques.  Participation Level:  Active  Participation Quality:  Appropriate and Sharing  Affect:  Appropriate  Cognitive:  Appropriate  Insight: Improving  Engagement in Group:  Engaged  Modes of Intervention:  Activity, Discussion, Education, Exploration and Coping Skills  Additional Comments: The topic of the group was relaxation techniques. Therapist explained that relaxation techniques are a great way to reduce stress and enjoy a better quality of life. Patients discussed some of the physical benefits and they discussed different types of strategies. Therapist identified techniques such as visualization, deep breathing, massage, meditation, Tai chi, yoga, and music and art therapy to reduce stress. Therapist explained that relaxation exercises take practice. Therapist pointed out to get the most benefit use relaxation techniques along with other positive coping methods, such as thinking positively, finding humor, problem-solving, managing time, exercising, getting enough sleep, and reaching out to supportive family and friends. One of the group members brought her guitar so the group members were able to benefit from practicing a relaxation technique and also having fun in recovery. Patient was a positive group member who participated by singing during segment when group member was playing guitar.   Plan: 1.Patient gain insight as to helpfulness of relaxation techniques and gain motivation to implement into her daily recovery plan.   Diagnosis: Primary Diagnosis: Bipolar I disorder, most recent episode depressed ([place]) [F31.30]  1. Bipolar I disorder, most recent episode depressed ([place])       [name],Lisa A 09/14/2015

## 2015-09-14 NOTE — Progress Notes (Signed)
09/14/15 Encounter: 12:45 PM - 4 PM  Purpose: To address individual progress in recovery. Intervention: Open guided discussion on what each group member identifies as assets/deficits in their current recovery journey. Effect: Lisa [name] stated the need to broaden her current support system to maintain a foundation of wellness.  Lisa [name] Lisa [name] CPSS

## 2015-09-15 ENCOUNTER — Other Ambulatory Visit (HOSPITAL_COMMUNITY): Payer: BLUE CROSS/BLUE SHIELD | Admitting: Licensed Clinical Social Worker

## 2015-09-15 DIAGNOSIS — F319 Bipolar disorder, unspecified: Secondary | ICD-10-CM | POA: Diagnosis not present

## 2015-09-15 DIAGNOSIS — F3130 Bipolar disorder, current episode depressed, mild or moderate severity, unspecified: Secondary | ICD-10-CM

## 2015-09-15 NOTE — Psych (Signed)
   [facility]  Arman FilterCorina Guidotti [id number]   Date: 09/15/15 Time: 1:00 PM-2:30 PM  Group Topic/Focus:  Activty on Worksheet "Road to Recovery"  Participation Level:  Active  Participation Quality:  Attentive, Sharing and quiet  Affect:  Blunted  Cognitive:  Appropriate  Insight: Limited  Engagement in Group:  Engaged and Lacking  Modes of Intervention:  Activity, Discussion, Education and Exploration  Additional Comments: Topic of group was a worksheet called "Road to Recovery". Group members had to draw their road to recovery including roadblocks, potholes, road-signs, shortcuts or side streets that they have encountered since starting on their road. They also were also to include times when they made a wrong turn, and the hills and valleys of their recovery. They were asked to begin with their first encounter and then draw their road into the future. During the discussion of patient's maps, therapist discussed how recovery is their entire experience including when they had relapses and went into the hospital. Even during "relapses" they were learning skills and ways to avoid potholes. Patient participated in activity but did not seem engaged. She was quiet in group and therapist asked if she was doing aright and she said "I am just quiet".   Plan: 1.Therapist will continue to engage patient and utilize skills to build therapeutic relationship to help patient open up in group so she is able to use group therapeutically.  [facility] BH PHP THERAPIST GROUP NOTE  Date: 09/15/15  Time: 2:30 PM-4:00 PM   Group Topic/Focus:  Peer Specialist run group on trauma informed care and therapist also participated  Participation Level:  Active  Participation Quality:  Attentive, Sharing and quiet  Affect:  Blunted  Cognitive:  Appropriate  Insight: Limited  Engagement in Group:  Engaged and Lacking  Modes of Intervention:  Discussion, Education, Exploration,  Problem-solving and coping skills  Additional Comments: Group was led by peer specialist on topic of trauma informed care and therapist also participated in discussion. The topic was brought up that many of the patients have experienced some type of trauma and patients were asked about their assets and behaviors that helped them to survive. Through discussion patients were able to see their strengths and skills they have developed and that they will be able to apply to difficult events and emotions that arise in their lives. One of the strategies mentioned was "sitting with the pain". By sitting with the pain, patients can work through it, that it is a "catharsis, they address it, and then look to the next step. If there is enough pain you move away from it and then an option is to look for a creative way to deal with their feelings. Azari related that she watches videos and testimonials so that she does not feel so alone. She related that music helps her to process feelings. She also related that socializing, staying busy, and sleeping are behaviors that have helped her to survive difficult situations. Tessla remains quiet and therapist will continue to explore any underlying depressive symptoms and encourage her to discuss in group.  Plan:1.Therapist explore any underlying symptoms patient may have indicated by her being quiet and removed in group.2.Patient will gain insight to using group process to work through issues and emotions.     Diagnosis: Primary Diagnosis: Bipolar I disorder, most recent episode depressed (HCC) [F31.30]    1. Bipolar I disorder, most recent episode depressed (HCC)       [name],[name] 09/15/2015

## 2015-09-15 NOTE — Progress Notes (Signed)
09/15/15 Encounter: 1:30 PM - 4 PM  Purpose: Addressing assets in facing trauma during recovery. Intervention: Open guided discussion on identifying recovery-oriented characteristics & actions. Effect: Armanda shared how developing a strong support system & staying intellectually occupied with school studies encouraged her on her road to recovery.  Donnamarie Rossettiodd Thao Bauza CPSS

## 2015-09-15 NOTE — Progress Notes (Signed)
Group Progress Note  Program: PHP  Group Time: 1.30pm-2.30pm  Participation Level: Active  Behavioral Response: Appropriate  Type of Therapy:  Yoga Skills Group  Summary of Progress: counselor led group through energizing yoga class with focus on movement in coordination of breath.  Also introduced to use of imagery with healing internal light. Pt expressed that she was feeling just "ok", affect depressed. Pt fully participating in group today and was observed moving through poses at pace of own breath. Dr. Jama Flavorsobos came for pt during group.  As pt wasn't present at end of group wasn't able to process effect.    Forde RadonLeanne Antinio Sanderfer, LPC

## 2015-09-15 NOTE — Progress Notes (Signed)
Adult Psychoeducational Group Note  Date:  09/15/2015 Time:  3:46 PM  Group Topic/Focus: Conflict Resolution:   The focus of this group is to discuss the conflict resolution process and how it may be used upon discharge. Making Healthy Choices:   The focus of this group is to help patients identify negative/unhealthy choices they were using prior to admission and identify positive/healthier coping strategies to replace them upon discharge.  Participation Level:  Active  Participation Quality:  Appropriate  Affect:  Appropriate  Cognitive:  Appropriate  Insight: Improving  Engagement in Group:  Engaged  Modes of Intervention:  Activity, Discussion, Education, Exploration and Problem-solving  Additional Comments:  Patients were taught and asked to demonstrate their problem-solving skills. Patients were asked to define a problem clearly, brainstorming multiple solutions, role-playing, listing the pros and cons of each solution, seeking input from others, selecting and implementing a plan of action, and evaluating and readjusting the outcome. Patients were asked to discuss scenarios in which they have been prone to social anxiety. Clients were assisted with exploring the stressors and creating coping skills applicable to the issue. Patients were asked to discuss, in detail, one large obstacle they would like to overcome. Each patient goal was explored, accessed for risk, problem solved and verbalized as a commitment by the patient. Patient was able to display a clear understanding of the use of problem-solving skills as necessary to decrease anxiety symptoms.    Lisa [name] reports feeling "not like myself" and states that since she has begun to adhere to her medication schedule that she feels "Disconnected and blank" and states her brain "is in a fog all the time." She offered that she is having difficulty thinking, being in relationships and fears being unable to hold a steady job due to the  possibility of relapse. Therapist assisted her with identifying the cognitive distortion patterns in her statement and utilized CBT techniques to collaborate with her to brainstorm  Possible positive vs. negative solutions. While she was able to list some positive aspects of medication adherence she maintains that she continues to fear the stigma associated with her mental health symptoms.   Lisa [name] 09/15/2015, 3:46 PM

## 2015-09-16 ENCOUNTER — Other Ambulatory Visit (HOSPITAL_COMMUNITY): Payer: BLUE CROSS/BLUE SHIELD | Admitting: Licensed Clinical Social Worker

## 2015-09-16 DIAGNOSIS — F3130 Bipolar disorder, current episode depressed, mild or moderate severity, unspecified: Secondary | ICD-10-CM

## 2015-09-16 DIAGNOSIS — F319 Bipolar disorder, unspecified: Secondary | ICD-10-CM | POA: Diagnosis not present

## 2015-09-16 NOTE — Progress Notes (Signed)
Adult Psychoeducational Group Note  Date:  09/16/2015 Time:  12:38 PM  Group Topic/Focus: Making Healthy Choices:   The focus of this group is to help patients identify negative/unhealthy choices they were using prior to admission and identify positive/healthier coping strategies to replace them upon discharge. Personal Choices and Values:   The focus of this group is to help patients assess and explore the importance of values in their lives, how their values affect their decisions, how they express their values and what opposes their expression.  Participation Level:  Active  Participation Quality:  Appropriate  Affect:  Anxious, Appropriate and Flat  Cognitive:  Appropriate  Insight: Improving  Engagement in Group:  Developing/Improving  Modes of Intervention:  Activity, Clarification, Discussion, Education, Exploration and Limit-setting  Additional Comments:  Patients were asked to do an exercise in which boundaries were explored.  Patients were paired in sets of two. Each patient was given the explanation that they were to walk towards the other set of patients and to hold up their hand to indicate "STOP" when the other person has reached the limits of their personal space. Patients were asked to discuss their feelings regarding the closeness they were allowed with each person. Feelings regarding past interactions in relationships were explored. Therapist inquired about setting limits and reviewed limits that were experienced. Therapist normalized feelings of disappointment and explained that boundaries are a means of safety and are usually closer when emotional connections have been established.  Patients were asked for examples and led in clarifying boundary issues and reluctance to establish appropriate boundaries in the past. Each was asked to problem solve and provide feedback on how they will be able to use this exercise with setting both physical and emotional boundaries in future  relationships.    Lisa [name] 09/16/2015, 12:38 PM

## 2015-09-16 NOTE — Psych (Signed)
   [facility]  Lisa [name] [id number]     Date: 09/16/15   Time: 10:00 AM-11:00 AM   Group Topic/Focus:  Self-Esteem-Unconditional Self-worth  Participation Level:  Active  Participation Quality:  Attentive and Sharing  Affect:  Appropriate  Cognitive:  Appropriate  Insight: Limited  Engagement in Group:  Engaged  Modes of Intervention:  Discussion, Education, Exploration, Support and Skills to [employer] Self-Esteem  Additional Comments: The topic of the group was self-esteem. Therapist discussed that the basis everyone has unconditional self-worth. Unconditional self-worth means that you are important and valuable as a person because your essential, core self is unique, precious, of infinite, eternal, unchanging value and good. Unconditional human worth implies that you are as precious as any other person. Patients discussed why humans have worth. Therapist pointed out that we has humans are fallible but infinitely perfectible and compare a person to a seed-whole: complete, but not completed-possessing in the seed every conceivable capacity. Therapist also related that when worth equals externals then self-worth rises and falls with events. Unconditional self-worth provides a foundation that can weather circumstances and outside events. Cheri GuppyCorina shared that after last manic episode that she feels that she lost her foundation. This discussion was helpful for her to see that capacities are innate and there to be discovered and developed and do not go go away. She is free to develop herself the way she wants to.   Plan: 1.Patient continue to work on self-esteem. [facility] PHP THERAPIST GROUP NOTE  Date: 09/16/15 Time: 11:00 AM-12:30 PM    Group Topic/Focus:  Peer Specialist run group "I am Enough"   Participation Level:  Active  Participation Quality:  Sharing  Affect:  Appropriate  Cognitive:  Appropriate  Insight: Limited  Engagement in Group:   Engaged  Modes of Intervention:  Discussion, Education, Exploration, Support and Skills to [employer] Self-Esteem  Additional Comments: Peer specialist led group on "I am Enough" and therapist was also present. Patients were asked questions such as whether they thought they were enough, whether they deserved better? and why they felt they deserved better? The questions helped them look at their potential and were empowering. Self-esteem was identified as a core recovery principle. Another concept that came from discussion was that if something was unhealthy then there was a need to reassess whether you needed in your life. [name] was cited to convey the message of worthiness with the quote "You are imperfect, you are wired for struggle, but you are worthy of love and belonging." Cheri GuppyCorina feels that she deserves what she was before her mania. The concept was highlighted for her that she has the right to define normal for ourselves.   Plan: 1.Patient continue to work on self-esteem.  Diagnosis: Primary Diagnosis: Bipolar I disorder, most recent episode depressed (HCC) [F31.30]    1. Bipolar I disorder, most recent episode depressed (HCC)       [name],[name] 09/16/2015

## 2015-09-17 NOTE — Addendum Note (Signed)
Addended by: Nehemiah MassedOBOS, Shizue Kaseman A on: 09/17/2015 11:41 AM   Modules accepted: Orders, SmartSet

## 2015-09-17 NOTE — Progress Notes (Addendum)
09/13/15 Psychiatric Assessment / Re- Admit Note to PHP. Duration - 35 minutes   CC- " I am here for PHP"   HPI- 24 year old Single female. Lives with parents  Patient is known to Clinical research associatewriter and to our program from recent admission in July/ 16 ,  and [facility] ( [facility]  [facility]/15) . She has a history of chronic mental illness, and has been diagnosed with Bipolar Disorder, with previous episodes of psychosis associated with mood decompensations . She was recently admitted to inpatient psychiatric unit due to worsening  depression,  Worsening neuro-vegetative symptoms of depression and emerging suicidal ideations, with thoughts of overdosing . On this occasion she did not have any psychotic symptoms. Recent break up with boyfriend was a stressor that contributed to her depression. She was stabilized on inpatient unit and discharged home, with [facility] . She was discharged on Seroquel/ Lamictal . States she is tolerating these medications well thus far .  States she has continued to feel better and "OK" since her  Recent discharge from the unit . Denies any significant neuro-vegetative symptoms of depression at this time- sleep and appetite have been"OK", and sense of self esteem has improved. Denies any current hallucinations, does not appear psychotic and no delusions are expressed at this time. Of note, patient has history of abusing cannabis, and her initial decompensation 3-4 years ago occurred in the context of regular cannabis use. She had smoked cannabis prior to her recent decompensation as well. She denies any drug or alcohol use since her discharge.  Past Psych. History- Has been diagnosed  with Bipolar Disorder versus  Schizoaffective Disorder,  most recent episode depressed, resulting in recent psychiatric admission. ( 10/4 through 09/09/15)  She has had prior  decompensations  which have coursed  with psychosis, and has had  several prior  psychiatric admissions. Denies history of suicide attempts . Her outpatient psychiatrist is Dr. Jannifer FranklinAkintayo at [facility] for [facility] psychiatric management. Prior psychiatric medication trials have included Abilify, but patient has stated Seroquel has seemed more effective and denies having had any significant side effects from  it thus far .    Substance Abuse History - History of Cannabis  Abuse, denies any use since she discharged from hospital recently. Denies alcohol or other drug abuse .  Medical History- Denies any medical illnesses ,Does not smoke .   Allergies : [facility]- we reviewed- reports EPS from this medication.   ROS: denies headache, denies ocular issues, denies chest pain, denies shortness of breath, denies nausea, Denies vomiting, denies diarrhea, denies abdominal pain, denies dysuria, denies urgency, denies rash, denies fever, no chills.   Social History- Single, no children, lives with parents , had been working at a [employer] but states she has resigned as it was not a good environment for her . She is in college, working on Social Work degree . Denies any financial or legal  issues . Recent break up with BF .   Family History- She lives with her parents,  Describes stable family relationship . Two  sisters, states mother has history of psychosis ( Schizophrenia ?)  Denies alcohol or drug abuse in family. Denies suicides in family.  Current Medications: Seroquel 200 mgrs QHS Lamictal 25 mgrs BID   MSE : Alert and attentive, well related, polite,  Well groomed , No psychomotor restlessness or agitation, speech normal , not pressured . She describes Mood  is " OK" and describes it is much better than prior to her recent admission. At this time does not report  depression . Affect reactive , smiles appropriately, not irritable or expansive at present , thought process is linear , no   Flight of ideations  Noted, she   denies any suicidal ideations or any self injurious thoughts at present. No homicidal ideations. Denies hallucinations and does not appear internally preoccupied at present, no delusions expressed , no grandiose ideations. 0x3. Has insight regarding her mental illness .   Assessment- 24 year old single female, who hasa history of Bipolar Spectrum Disorder, with several prior psychiatric admissions starting in 2014. She was recently admitted to our inpatient unit from to , due to worsening depression and suicidal ideations. She has had prior psychotic symptoms but did not have any psychotic symptoms during her most recent mood episode . At this time she is doing better, mood is improved and today seems stable and relatively euthymic, with an appropriate range of affect. There are no current psychotic symptoms and she is not suicidal . She reports she is tolerating current medication regimen well at this time.  No EPS, no akathisia, and denies any significant weight gain from Seroquel at this time.  Of note, prior mood episodes , decompensations appear to be partly related to cannabis abuse - she reports she is more aware of this at this time and that she has abstained from cannabis since discharge from hospital.     Dx.  Bipolar Disorder I , most recent  Depressed - consider also Schizoaffective Disorder, Bipolar type  Cannabis Abuse    Plan- Admit to PHP- this level of care warranted to minimize risk of further worsening and to minimize risk of  Symptom exacerbation/ psychiatric readmission.  For now continue medications as highlighted above . Does not need scripts from me at this time.   Nehemiah Massed, MD

## 2015-09-19 ENCOUNTER — Other Ambulatory Visit (HOSPITAL_COMMUNITY): Payer: BLUE CROSS/BLUE SHIELD | Admitting: Licensed Clinical Social Worker

## 2015-09-19 DIAGNOSIS — F3130 Bipolar disorder, current episode depressed, mild or moderate severity, unspecified: Secondary | ICD-10-CM

## 2015-09-19 DIAGNOSIS — F319 Bipolar disorder, unspecified: Secondary | ICD-10-CM | POA: Diagnosis not present

## 2015-09-19 NOTE — Progress Notes (Signed)
09/19/15 Encounter: 12:30 - 4 PM  Purpose: Performing "check-in" of current recovery process. Intervention: Open guided discussion on charting individual self-assessment of recovery journey. Effect: Lisa GuppyCorina recognized her need to work through the "grieving process" of resuming her recovery apart from mania & psychosis.  Lisa [name] CPSS

## 2015-09-19 NOTE — Psych (Signed)
[facility] BH PHP THERAPIST PROGRESS NOTE  Jayelyn Barno [id number]   Date: 09/19/15  Time:  11:00 AM-12:30 PM  Group Topic/Focus:  Coping Skills for Depression  Participation Level:  Active  Participation Quality:  Attentive and Sharing  Affect:  Appropriate  Cognitive:  Appropriate  Insight: Improving  Engagement in Group:  Engaged  Modes of Intervention:  Discussion, Education, Exploration and Coping Skills  Additional Comments: Group discussion related to coping strategies for depression as one group member reported severe depression over the weekend. The group discussed how sometimes depression can be a powerful feeling without identifying a source and that we still do not know everything about mental health so that there could be a number of reasons for these overwhelming feelings. The group identified reaching out to supports, mindfulness, distraction, and riding through the "wave of emotion" rather than acting on them. If you allow yourself to be with your emotion, exposing yourself to emotions and not acting on them, you will find they are not catastrophic. You are not your emotion. The group also discussed a spiritual approach that is to take the approach of taking steps to make life better even when you are feeling that life is difficult. Additionally, there was supportive discussion about how it is scary with mental health and you do not have a norm and your mental health is all over the place. The suggestion was to figure out what the norm is for you and try to maintain what the norm is for you. This was helpful group for Dejanay who is able to get support from other group members dealing with depression as well as insight to effective coping skills.  Plan: 1.Mikal continue to use the group for support and gain insight to effective coping skills for depression.  [facility] PHP THERAPIST GROUP NOTE  Date: 09/19/15 Time: 1:00 PM-2:00 PM   Group Topic/Focus:  Group on Healthy  Relationships and Peer Support also participated  Participation Level:  Active  Participation Quality:  Attentive and Sharing  Affect:  Appropriate  Cognitive:  Appropriate  Insight: Improving  Engagement in Group:  Engaged  Modes of Intervention:  Discussion, Education, Exploration and Skills for a Healthy Relationship  Additional Comments: The group discussion related to the healthiness of being in relationships in early recovery. The helpfulness of supportive relationships was identified including that the Partial Program was beneficial because it offered this support. Therapist cautioned group members that relationships could provide additional stress and this could put one at higher risk of one is not stable enough to manage. It could lead to worsening symptoms. Having a strong foundation by working on oneself first would help navigate the stressors of a relationship. One of the attractions of a relationship is that it helps one feel better and takes the focus on oneself. Kamyia shared that she has had obsessive thoughts about a person who has been supportive to her during a period when she was struggled with her mental health symptoms. Patients discussed how when you resist thoughts the more that you have them. Therapist suggested that the first step in dealing with these thoughts is to forgive herself for being imperfect. If she were to fix everything in her head, she would always have something to fix. Kylei admitted that she could be focused on the relationship because she is bored and needs something to fill her life while she is between things.  Therapist encouraged Aarin to think about working on herself in her early recovery and  this would put her in a better position to make healthier choices in regards to relationships. Also, therapist suggested to use the guideline of whether the relationship is a healthy one for her in her recovery.  Plan: 1.Patient use the group process to help  her in making healthy choices for her recovery.    Diagnosis: Primary Diagnosis: Bipolar I disorder, most recent episode depressed (HCC) [F31.30]    1. Bipolar I disorder, most recent episode depressed (HCC)       Dianah Pruett A 09/19/2015

## 2015-09-20 ENCOUNTER — Other Ambulatory Visit (HOSPITAL_COMMUNITY): Payer: BLUE CROSS/BLUE SHIELD | Admitting: Licensed Clinical Social Worker

## 2015-09-20 DIAGNOSIS — F319 Bipolar disorder, unspecified: Secondary | ICD-10-CM | POA: Diagnosis not present

## 2015-09-20 DIAGNOSIS — F3130 Bipolar disorder, current episode depressed, mild or moderate severity, unspecified: Secondary | ICD-10-CM

## 2015-09-20 NOTE — Progress Notes (Signed)
Group Progress Note  Program:PHP  Group Time: 1.30pm-2.30pm  Participation Level: Active  Behavioral Response: Appropriate and Restless  Type of Therapy:  Yoga Skills Group  Summary of Progress: counselor led group through grounding yoga practice.  Introduced and led through structural breath- breathing practice and focus on mindfulness of movement and sensations in body.  Invited to listen to needs through awareness of body and modify to meet their needs.  Modifications offered by counselor.  During final relaxation led through meditation w/ So Hum mantra.  Pt reported that she would be limited today in practice due to clothes.  Pt fully participated and was offered modifications when needed.  pt was seen moving through poses w/ her own breath.  Pt reported that today's practiced was very relaxing for her.   Forde RadonLeanne Yates, LPC

## 2015-09-20 NOTE — Psych (Signed)
[facility]  Arman FilterCorina Shrode [id number]   Date: 09/20/15   Time: 11:00 AM-12:00 PM   Group Topic/Focus:  slide show on "Core Beliefs and Self-Esteem".  Participation Level:  Active  Participation Quality:  Attentive and Sharing  Affect:  Appropriate  Cognitive:  Appropriate  Insight: Limited  Engagement in Group:  Engaged  Modes of Intervention:  Activity, Discussion, Education, Exploration and Skills to [employer] Self-Esteem  Additional Comments: Therapist presented slide show on "Core Beliefs and Self-Esteem". Patients were asked to describe themselves, their attributes and their characteristics. Therapist discussed signs of low self-esteem and patients rated on a scale of 1-10 their level of self-esteem. Group begain with a check in. Therapist explained that like many of things in our life that we value, it is something we have to work at and takes effort. Work on self-esteem has an impact on our mental and physical well-being and reduces symptoms of depression and anxiety.  Patients discussed how low self-esteem develops and some of the ways to increase self-esteem. Tala rated her self-esteem at a 5. She described that she has self-defeating thoughts but that she believes they are real. She said that "she went crazy and lost her mind". Therapist challenged her on these beliefs but it did not seem she had shifted significantly in her perspective. Cheri GuppyCorina needs to gain insight that her core worth is not defined by her experiences and her belief in negative self-talk indicates the need to work on self-esteem. Therapist also worked on building insight that it was self-esteem was important aspect of her her well-being.   Plan: 1.Therapist continue to challenge negative self-talk and Anhthu gain insight as to ways she can build self-esteem.  [facility] PHP THERAPIST GROUP NOTE  Date: 09/20/15 Time: 12:30 PM-1:30 PM  Group Topic/Focus:  Pathways to Build Self-Esteem  and Peer Specialist Participated  Participation Level:  Active  Participation Quality:  Attentive  Affect:  Appropriate  Cognitive:  Appropriate  Insight: Limited  Engagement in Group:  Engaged  Modes of Intervention:  Discussion, Education, Exploration and Skills to [employer] Self-Esteem  Additional Comments: Topic of group was more in depth discussion of ways to build self-esteem. Patients discussed some of the factors on which it is built. It is built on three sequential factors: 1. Unconditional human worth, 2.love and 3. Growing.  Growing means moving in the desired direction. Therapist explained that love refers to a source that they are loved, accepted and worthwhile. It is a soil for growth. The sequences is important. Too many people become frustrated because they try to start with growth, and neglect the first low important factors: unconditional worth and love. Without a secure base, self-esteem topples. The discussion also involved identifying whether there was an internal and external balance to their sense of worth. Cheri GuppyCorina relates that her self-esteem comes from helping others. Remedy related that she did not have a good understanding of self-esteem. Therapist pointed out it is when we see our value and worth. She does see that action comes from self-esteem. Therapist pointed out that action is the part of self-esteem where we are growing and developing our capacities and potentialities. It appears that Cheri GuppyCorina needs more of a balance and an internal sense of self worth.   1.Deondra continue to gain insight and take positive steps to work on self-esteem.    Diagnosis: Primary Diagnosis: Bipolar I disorder, most recent episode depressed (HCC) [F31.30]    1. Bipolar I disorder, most recent  episode depressed (HCC)       [facility] 09/20/2015

## 2015-09-20 NOTE — Progress Notes (Signed)
09/20/15 Encounter: 12:30 - 4:00 PM  Purpose: Addressing affirmative self-worth. Intervention: Open guided discussion on using personal connection & positive affirmation to increase self-esteem. Effect: Shaivi stated her conclusion that "self-esteem directly influences our actions."  Donnamarie Rossettiodd Vanellope Passmore CPSS

## 2015-09-20 NOTE — Progress Notes (Signed)
Adult Psychoeducational Group Note  Date:  09/20/2015 Time:  5:26 PM  Group Topic/Focus: Dimensions of Wellness:   The focus of this group is to introduce the topic of wellness and discuss the role each dimension of wellness plays in total health. Recovery Goals:   The focus of this group is to identify appropriate goals for recovery and establish a plan to achieve them.  Participation Level:  Active  Participation Quality:  Appropriate and Attentive  Affect:  Appropriate  Cognitive:  Alert and Appropriate  Insight: Improving  Engagement in Group:  Engaged  Modes of Intervention:  Activity, Clarification, Discussion, Education and Exploration  Additional Comments:  Patients were shown a 10 minute clip from the movie "The Matrix."  The scene from the clip focused on the character Neo choosing the red pill or the blue pill. Therapist offered each group member one red M&M and one blue M&M. Therapist explained that the blue M&M would allow them to continue life as it is. They would not have to follow therapeutic advice, practice any strategies, develop any new coping skills and could remain dis-engaged until [facility]. Therapist then connected comments to meaning from the movie that to choose the red M&M would open their possibility to exact positive change in their lives. Each patient was asked to choose which M&M they would like to eat. After choosing, each was directed to throw away the other M&M and to explain why they'd chosen the one they'd eaten.  Each patient was asked to explain what they looked forward to changing in their life after eating the red M&M and if they did not, they were asked if they were able to accept that their life will remain the same if choosing the blue pill.Lisa [name] chose the red and gave insight into her goals for recovery.    Lisa [name] 09/20/2015, 5:26 PM

## 2015-09-20 NOTE — Progress Notes (Signed)
Adult Psychoeducational Group Note  Date:  09/19/2015 Time:  4:25pm  Group Topic/Focus: Early Warning Signs:   The focus of this group is to help patients identify signs or symptoms they exhibit before slipping into an unhealthy state or crisis.  Participation Level:  Active  Participation Quality:  Appropriate  Affect:  Appropriate  Cognitive:  Alert and Appropriate  Insight: Improving  Engagement in Group:  Engaged  Modes of Intervention:  Activity, Clarification, Confrontation, Discussion, Exploration and Limit-setting  Additional Comments:  Therapist assisted patients in using their newly adapted behavior patterns and emotional regulation skills to reduce denial and increase insight into the effects of previous traumas. Patients were helped to reduce maladaptive emotional and/or behavioral responses to trauma-related stimuli through the regular use of adaptive behavioral patterns and emotional skills. Therapist connected comments about various patients' toleration of distress patterns related to past trauma. Patients were helped to express and confront their emotions when remembering and accepting the facts of previous trauma and in reducing self-blame.   Lisa [name] indorses attempts to "normalize" her life but states she does not "feel like myself." She reports it as "going through a grief process" and describes past interactions with her friends and associates as "crazy and out of my mind." She states she is embarrassed by actions which she may have displayed during her stages of mania/paranoia and has begun to isolate herself to avoid the need to explain past behaviors medically. She reposts feelings of increased anxiety when she encounters former friends in public and states she associates them with feelings of shame and guilt. She offered "I feel like I'm starting over." Therapist actively listed as she explained several instances in which she considers "traumatic." Therapist gave  feedback on the process of grief as it applies to relationships and the loss of something and/or someone. Therapist utilized empathetic understanding when listening to Lisa [name] explain her fears associated with each event. Therapist assisted her with re-defining the feelings and emotions associated with each event. Therapist helped her to develop positive affirmations and utilized humor where appropriate and welcomed.    Lisa [name] 09/19/2015, 4:25pm

## 2015-09-21 ENCOUNTER — Other Ambulatory Visit (HOSPITAL_COMMUNITY): Payer: BLUE CROSS/BLUE SHIELD | Admitting: Licensed Clinical Social Worker

## 2015-09-21 DIAGNOSIS — F319 Bipolar disorder, unspecified: Secondary | ICD-10-CM | POA: Diagnosis not present

## 2015-09-21 DIAGNOSIS — F3130 Bipolar disorder, current episode depressed, mild or moderate severity, unspecified: Secondary | ICD-10-CM

## 2015-09-21 NOTE — Psych (Signed)
[facility]  [name] [id number]  Date: 09/21/15  Time: 11:00 AM-12:30 PM   Group Topic/Focus:  Group topic related to Ted Talk by Filiberto Pinksracy [name] "[facility]"  Participation Level:  Active  Participation Quality:  Attentive and Sharing  Affect:  Appropriate  Cognitive:  Appropriate  Insight: Lacking  Engagement in Group:  Engaged  Modes of Intervention:  Activity, Discussion, Education, Exploration and Skills to [employer] Self-Esteem and Relationships  Additional Comments: Therapist showed a video by Filiberto Pinksracy [name] from [employer] titled "The [facility] you Need [facility]". The message of the video is to marry yourself through a commitment to yourself. You are whole already and part of your vow to yourself is to love yourself right where you are. You commit to yourself by being honest with yourself and you make a commitment "for better or for worse".  You forgive yourself for your mistakes and that mistakes are only mistakes if you don't learn and grow.  You realize that you are committed to yourself in a relationship until "death do you part". You can count on yourself, and love yourself the way you want someone else to love you.  By loving yourself right where you are, you are able to get to where you need to go. When you love yourself, you can love people the way they are. You can love in a new way, you have everything you need and you can light up the part of your world in a new way. The video was helpful for patient in gaining insight to treatment issues of self-esteem and healthy relationships. The message of forgiving yourself was helpful for Lisa GuppyCorina who is working through forgiving herself for guilt and shame related to manic episode. She is still working on grasping the concept of self-esteem.   Plan: 1.Lisa [name] gain insight to self-esteem and forgiving self to apply to her help her in making progress on treatment goals.   [facility] PHP THERAPIST  GROUP NOTE  Date: 09/21/15  Time: 1:00 PM-2:00 PM   Group Topic/Focus:  Peer Specialist Run Group on Existentialism as it relates to recovery  Participation Level:  Active  Participation Quality:  Attentive and Sharing  Affect:  Appropriate  Cognitive:  Appropriate  Insight: Limited  Engagement in Group:  Engaged  Modes of Intervention:  Activity, Discussion, Education, Exploration and Strength-Based  Additional Comments: Peer specialist led group on Existentialism and related this to patient's recovery and therapist also participated. The concept of existentialism was explained that here you are free to determine your own development apart from unhelpful ideas & values.  Group members were asked what choices so they feel they are free to make in recovery?  Existential crisis was explained as questioning everything you have ever believed; whether your life has any meaning, purpose or value. Group was guided in an understanding of how they could handle crisis. They could handle crisis by realizing that they can and do define what is meaningful for themselves; reality is their choice by practicing discovery. Discovery means what is your Core(what makes you unique & special)? Also through autonomy where you personally challenge ideas & values that keep you from recovery.  The group was effective for patient's to recognize their own power in their recovery.  Plan: 1.Lisa [name] apply message of empowerment to work on healthy treatment goals.    Diagnosis: Primary Diagnosis: Bipolar I disorder, most recent episode depressed ([facility]) [F31.30]    1.  Bipolar I disorder, most recent episode depressed ([facility])       [name],Lisa A 09/21/2015

## 2015-09-21 NOTE — Progress Notes (Signed)
09/21/15 Encounter: 12:30 - 4 PM  Purpose: To address the value of recovery-based choice. Intervention: Open guided discussion on understanding personal choice & overcoming crisis in recovery using principles of existentialism. Effect: Lisa [name] shared how she is facing questioning the foundation of her former belief system & the opportunity for present growth.  Donnamarie Rossettiodd Amyrah Pinkhasov CPSS

## 2015-09-21 NOTE — Progress Notes (Signed)
Adult Psychoeducational Group Note  Date:  09/21/2015 Time:  5:04 PM  Group Topic/Focus: Building Self Esteem:   The Focus of this group is helping patients become aware of the effects of self-esteem on their lives, the things they and others do that enhance or undermine their self-esteem, seeing the relationship between their level of self-esteem and the choices they make and learning ways to enhance self-esteem. Emotional Education:   The focus of this group is to discuss what feelings/emotions are, and how they are experienced.  Participation Level:  Active  Participation Quality:  Appropriate, Attentive and Sharing  Affect:  Appropriate  Cognitive:  Alert  Insight: Improving  Engagement in Group:  Engaged  Modes of Intervention:  Activity, Clarification, Confrontation, Discussion, Education, Exploration, Limit-setting, Problem-solving, Rapport Building and Reality Testing  Additional Comments:  Therapist utilized a 12 minute video clip to explain the components and meaning of Emotional Intelligence. The video outlined five essential components of Emotional Intelligence (EQ). Those components are known as: self-awareness; managing emotions; self-motivation; empathy and handling relationships. Therapist educated patients by exploring each of the components and teaching them how to develop the skills necessary to improve their EQ. Patient were asked to give examples from their lives to explain how their emotions have either positively or negatively affected others. Therapist addressed patient's concerns and fears in dealing with certain emotions. Therapist explored self-defeating life patterns and addressed relationship conflicts which identified signs of emotional abuse. Therapist utilized a scale of 21 Signs of Emotional Abuse and asked patients to rate their most recent past or present relationship. Therapist continued to build rapport and trust through empathy, information, increased  awareness, connectedness and teaching self-reflection.   Angell Honse 09/21/2015, 5:04 PM

## 2015-09-22 ENCOUNTER — Other Ambulatory Visit (HOSPITAL_COMMUNITY): Payer: BLUE CROSS/BLUE SHIELD | Admitting: Licensed Clinical Social Worker

## 2015-09-22 DIAGNOSIS — F319 Bipolar disorder, unspecified: Secondary | ICD-10-CM | POA: Diagnosis not present

## 2015-09-22 DIAGNOSIS — F3130 Bipolar disorder, current episode depressed, mild or moderate severity, unspecified: Secondary | ICD-10-CM

## 2015-09-22 NOTE — Psych (Signed)
[facility] BH PHP THERAPIST PROGRESS NOTE  Hydia Copelin [id number]    Date: 09/22/15  Time: 11:00 AM-12:30 PM   Group Topic/Focus:  Therapist introduced Maslow's hierarchy of needs pyramid  Participation Level:  Active  Participation Quality:  Attentive and Sharing  Affect:  Appropriate  Cognitive:  Appropriate  Insight: Limited  Engagement in Group:  Engaged  Modes of Intervention:  Discussion, Education, Exploration and Coping Skills  Additional Comments:  Therapist introduced Maslow's hierarchy of needs pyramid.  Group began with a check in. Therapist explained that in his theory he identifies higher order psychological needs.  While not necessary for survival, meeting these needs is essential to your emotional well-being and a satisfying adjustment to life.  In his hierarchy, taking care of higher level needs is dependent on having satisfied lower-level needs.  It is difficult to fulfill your full potential if you're feeling isolated and alienated for lack of having met needs for love and belongingness. Therapist encouraged patients to recognize that finding purpose and meaning in daily life would improve mood. Aileena felt that with her struggles with mental health issues it makes it harder for her to work on higher level needs. She said that she has a lack of emotion and is tired of feeling this way. When she is not by herself and in her head then it is not a problem, but when she is home alone she is back to feeling a void and lack of emotion. Therapist pointed out that this comes from a perspective of being depressed so it is distorted. Therapist pointed out that rather than her manic state where she had the experience of feeling every emotion, she gives herself the chance to slowly experience positive emotion and to work into a healthy emotion. Zalika continues to struggle with her depressive and therapist is utilizing interventions to reframe and help her gain perspective.    The  group became a supportive milieu as patients identifying with each other in struggling with their mental health issues. Therapist pointed out that seeing the pyramid help give them insight to be able to better recognize their needs as learning to take care of yourself involves being able to 1) recognize and 2) meet your basic needs as a human being. Shey seemed to find support from the group with other group members who struggled with mental health issues.  Plan: 1.Mattilyn use the group as a supportive milieu as she deals with depressive symptoms. 2.Nakeshia take steps to recognize and better meet her needs to increase positive emotion.   [facility] PHP THERAPIST GROUP NOTE  Date: 09/22/15  Time: 1:00 PM-2:00 PM   Group Topic/Focus:  Group topic on emotional regulartion skills "Overcoming the Barriers to Healthy Emotions"  Participation Level:  Active  Participation Quality:  Attentive  Affect:  Appropriate  Cognitive:  Appropriate  Insight: Limited  Engagement in Group:  Engaged  Modes of Intervention:  Activity, Discussion, Education, Exploration and DBT  Additional Comments: Therapist introduced worksheet on emotional regulation skills called "Overcoming the Barriers to Healthy Emotions". Patients learned how your emotions can influence your thoughts and behavior and also how your emotions can be affected by your thoughts and behaviors. This can be a vicious cycle for your emotions if you get caught in self-destructive behaviors or self-critical thinking. But this cycle can also lead to more fulfilling emotional experiences if you engage in healthy behaviors, reframing and self-affirming thoughts. Patients also discussed how when positive emotions come back, they can be  looked at as a seed and slowly increase to where it is a healthy emotion. When the positive emotion comes back, ask themselves what they were doing and how can I do more. Look to Identify the emotion as it comes back. This  was a helpful educational activity for Macayla to help her understand that she can intervene to create positive emotions through behaviors and thoughts although she reports that it is difficult to change thoughts and she feels that emotions are the starting pont for thoughts and behaviors. Therapist identified that one of the sources of her depression comes from loss. Therapist encouraged her to see that she can recreate her life and that she now can choose what assets she wants to make part of her life now. Heidee continues to work through the loss process and therapist utilizing interventions to help her work through the loss process to build hope.   Plan: 1.Passion gain insight to effective strategies to manage her emotions and to implement into life situations. 2.Nalaya continue to work through the loss process.     Diagnosis: Primary Diagnosis: Bipolar I disorder, most recent episode depressed ([place]) [F31.30]    1. Bipolar I disorder, most recent episode depressed ([place])       Shonta Bourque A 09/22/2015

## 2015-09-22 NOTE — Progress Notes (Signed)
Adult Psychoeducational Group Note  Date:  09/22/2015 Time:  4:36 PM  Group Topic/Focus: Managing Feelings:   The focus of this group is to identify what feelings patients have difficulty handling and develop a plan to handle them in a healthier way upon discharge. Rediscovering Joy:   The focus of this group is to explore various ways to relieve stress in a positive manner.  Participation Level:  Active  Participation Quality:  Attentive  Affect:  Anxious  Cognitive:  Alert  Insight: Lacking  Engagement in Group:  Developing/Improving  Modes of Intervention:  Activity, Discussion, Education, Exploration and Problem-solving  Additional Comments:  Therapist taught group members that in bereavement there are five stages of grief: Denial, Anger, Bargaining, Depression and Acceptance.   Therapist explained that each person spends different lengths of time working through each step and may express each stage with different levels of intensity.  Therapist discussed with the group that the five stages do not necessarily occur in any specific order and that each person may cycle back and forth throughout the stages. Therapist explained that there is no time limit on grief and that each stage may not necessarily be "finished" but "set aside" until acceptance has been reached.  Patients were asked to discuss their experiences with grief whether it be the loss of a loved one; loss of a pet, relationship loss, job loss, etc. Therapist actively listened and provided support as patients reflected on their understandings and experiences with the process of grief.   Lisa [name] 09/22/2015, 4:36 PM

## 2015-09-22 NOTE — Progress Notes (Signed)
09/22/15 Encounter: 12:30 - 4 PM  Purpose: To address learning as a growth process in recovery. Intervention: Open guided discussion on expanding intellectual horizons using the philosopher Plato's "allegory of the cave". Effect: Lisa [name] concluded that the intellect, once taught new information, has the opportunity to better approach the recovery process.  [name] CPSS

## 2015-09-23 ENCOUNTER — Other Ambulatory Visit (HOSPITAL_COMMUNITY): Payer: Self-pay

## 2015-09-25 ENCOUNTER — Other Ambulatory Visit (HOSPITAL_COMMUNITY): Payer: Self-pay | Admitting: Psychiatry

## 2015-09-25 MED ORDER — LAMOTRIGINE 25 MG PO TABS: ORAL_TABLET | ORAL | Status: DC

## 2015-09-25 NOTE — Addendum Note (Signed)
Addended by: Nehemiah MassedOBOS, FERNANDO A on: 09/25/2015 10:58 PM   Modules accepted: Orders

## 2015-09-25 NOTE — Progress Notes (Addendum)
09/22/15  Medication Management Note   Duration - 25 minutes   Diagnosis- Bipolar Disorder, most recent Depressed, consider also Schizoaffective Disorder   Subjective - In general, reports she has been doing better. She is less depressed, and denies any psychotic symptoms ( no hallucinations, no paranoid ideations or feelings ). She does state, however, that she is feeling excessively emotionally " flat lined, numb", which she associates temporally with Zoloft trial. She denies any recent cannabis use or any other substance/alcohol abuse . States she is working on finding a job. She is living with her parents and states relationship with them is stable . She feels less concerned and less ruminative about relationship break, issues .    Objective - Patient presents improved compared to her recent inpatient admission- she presents less depressed, and currently does not endorse any significant neuro-vegetative symptoms of depression.  As noted above, she does feel that Zoloft trial ( currently on low dose - 25 mgrs QDAY ) is causing her to feel emotionally blunted and without the depth of emotion that she usually feels . She describes this not as depression or anhedonia, but rather as being emotionally numb . Her affect, however, appears improved compared to her admission presentation- she is smiling at times appropriately, is not tearful or constricted. 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 Patient denies any recent drug abuse, has not used any cannabis since she was discharged from inpatient unit, and has good insight regarding mental illness and importance of medication compliance and abstinence from  illicit drugs as important for best outcome .   Current Medications- Zoloft 25 mgrs QDAY, Lamictal 25 mgrs  BID, Seroquel 200 mgrs QHS  ROS- no  headaches, no nausea , no vomiting , no chest pain, no shortness of breath,  no palpitations,  Denies excessive sedation, denies  akathisia , denies increased appetite or weight gain  MSE - alert and attentive, well  groomed,  Good eye contact improved, speech  Normal in tone, volume and rate,  no psychomotor agitation or restlessness, mood is improved, at this time does not present depressed ,   Reports she feels emotionally blunted, but affect is not constricted and does smile at times appropriately.  There is no thought disorder. She  denies any current SI or HI  denies hallucinations and does not appear internally preoccupied  , no delusions expressed, noted, patient is future oriented, looking for a job, 0x3 .   Assessment- Significant improvement compared to prior presentation during recent psychiatric admissions. At this time presents less depressed, with no reported significant neuro-vegetative symptoms and without any symptoms of psychosis. She has also remained abstinent from cannabis, which may have played a contributing role in previous decompensations. At this time reports, however, feeling uncomfortably emotionally blunted and flat , which she attributes to Zoloft . We discussed options and she prefers to stop Zoloft at this time. She is tolerating Lamictal and Seroquel well.  Plan- continue PHP, with goal of working on coping skills and ego strengths in order to minimize symptoms and reduce risk of recurrence of illness. D/C Zoloft - see rational above . Increase Lamictal to 25 mgrs QAM and  50 mgrs QHS Continue Seroquel 200 mgrs QHS .      Lisa MassedFernando [name], MD

## 2015-09-26 ENCOUNTER — Other Ambulatory Visit (HOSPITAL_COMMUNITY): Payer: BLUE CROSS/BLUE SHIELD | Admitting: Licensed Clinical Social Worker

## 2015-09-26 DIAGNOSIS — F3130 Bipolar disorder, current episode depressed, mild or moderate severity, unspecified: Secondary | ICD-10-CM

## 2015-09-26 DIAGNOSIS — F319 Bipolar disorder, unspecified: Secondary | ICD-10-CM | POA: Diagnosis not present

## 2015-09-26 NOTE — Psych (Signed)
[facility] PHP THERAPIST PROGRESS NOTE  Lisa [name] [id number]   Date: 09/26/15 Time: 11:00 AM-12:00 PM    Group Topic/Focus:  Therapist presented some of the concepts from the worksheet "Basic Interpersonal Effectiveness Skills".  Participation Level:  Active  Participation Quality:  Attentive and Sharing  Affect:  Appropriate and Resistant  Cognitive:  Appropriate  Insight: Limited  Engagement in Group:  Engaged  Modes of Intervention:  Activity, Discussion, Education, Exploration and DBT  Additional Comments: Therapist presented some of the concepts from the worksheet "Basic Interpersonal Effectiveness Skills".  The group also discussed codependency. Therapist explained that keeping your relationships healthy and alive requires interpersonal skills. Some of these skills include assertiveness, which is the ability to 1.Ask for what you want, 2.say no, and 3.negotiate conflict without damaging the relationship. Patients also discussed how codependency is an unhealthy dependence on relationships. How one feels stem from being liked by another. Therapists pointed out the outcome is that the power of our happiness is put in another person's hand and the solution is to work on fulfilling one's own needs and making oneself happy. Lisa [name] feels that she is direct with people but does have to work on codependency issues. She said that she feels isolated, feels numb and is struggling to socialize. It seems that her depression is distorting her perspective so that she remains discouraged when therapist has encouraged her to push through what she is feeling and trying something new and different. Therapist will continue to reframe and encourage her to see that behavioral change will be helpful.   Plan: 1.Therapist will continue to provide interventions that will help patient see that behavioral changes will be helpful.2.Lisa [name] gain insight as to positive changes that will be helpful for her  depression and apply to life situations.   [facility] PHP THERAPIST GROUP NOTE  Date: 09/26/15  Time: 12:30 PM-1:30 PM   Group Topic/Focus:  Group was led by pharmacist Michelle Nasuti who provided education about patient medication.  Participation Level:  Active  Participation Quality:  Appropriate and Sharing  Affect:  Appropriate  Cognitive:  Appropriate  Insight: Limited  Engagement in Group:  Engaged  Modes of Intervention:  Clarification, Discussion, Education and Exploration  Additional Comments: Group was led by pharmacist Michelle Nasuti who provided education about patient medication. Patients discussed some of the issues that they are having with their medications. The discussion brought up some therapeutic insights such as how everyone has to take risks and chances and the very nature of life itself involves taking chances. This was applied to Lisa [name] who is hesitant about joining new social clubs. This insight may help her overcome her hesitations by accepting that she will need to take chances to make positive changes. Patients discussed as well that medicines can "unlock the door" and help allow for transitions but changes is what will carry them forward. Patients also discussed how medications that were not helpful were ones that numb and suppress how we feel. The medications that are more helpful are ones that do not suppress but rather promote chemicals and promote how our body uses chemicals. Lisa [name] feels that Seroquel may be too sedating and discussed this with the pharmacist. The pharmacist said that she may want to consider Abilify that is less sedating effect.   Plan: 1.Lisa [name] gain insight that behavioral changes will play an important role in helping her to progress and apply this insight to life situations.   [facility] PHP THERAPIST GROUP NOTE  Date: 09/26/15  Time: 1:30 PM-3:30 PM   Group Topic/Focus:  Therapist presented segments of the film "About Time" with group and discussed some  of the themes of the movie. T  Participation Level:  Active  Participation Quality:  Attentive and Sharing  Affect:  Appropriate  Cognitive:  Appropriate  Insight: Limited  Engagement in Group:  Engaged  Modes of Intervention:  Activity, Discussion, Education and Exploration  Additional Comments: Therapist presented segments of the film "About Time" with group and discussed some of the themes of the movie. The main character of the movie, Lisa [name], discovers that the men in the family can travel in time throughout their own lives. Time travel is simply the catalyst for Lisa [name]'s journey, with the true heart of the movie is about every person's journey to find love, cope with loss and appreciate life while it moves relentlessly forward. Patients discussed some of the messages of the movie that included 1.Quality and quantity time with the people you love are the most cherished moments in life. 2."The real troubles in life will always be the things that never crossed your worried mind. 3."Sometimes, terrible things must happen in order for life to change for the better. 4.Live every moment to the fullest. "The truth is, now I don't travel back at all, not even for the day. I just try to live everyday as if I've deliberately come back to this one day. To enjoy it.  As if it was the full final day. Of my extraordinary, ordinary life." Patients were able to enjoy an activity in recovery while also being able to discuss and reflect on important themes in life and for their recovery.   Plan: 1.Patient gain insight through discussions in group that will help her discover more about herself and help her in developing her own program of recovery.     Diagnosis: Primary Diagnosis: Bipolar I disorder, most recent episode depressed (HCC) [F31.30]    1. Bipolar I disorder, most recent episode depressed (HCC)       [name],Lisa [name] 09/26/2015

## 2015-09-27 ENCOUNTER — Telehealth (HOSPITAL_COMMUNITY): Payer: Self-pay | Admitting: Licensed Clinical Social Worker

## 2015-09-27 ENCOUNTER — Other Ambulatory Visit (HOSPITAL_COMMUNITY): Payer: BLUE CROSS/BLUE SHIELD | Admitting: Licensed Clinical Social Worker

## 2015-09-27 DIAGNOSIS — F319 Bipolar disorder, unspecified: Secondary | ICD-10-CM | POA: Diagnosis not present

## 2015-09-27 DIAGNOSIS — F3130 Bipolar disorder, current episode depressed, mild or moderate severity, unspecified: Secondary | ICD-10-CM

## 2015-09-27 NOTE — Progress Notes (Signed)
09/27/15 Encounter: 12:30 - 1:30 PM, 2:30 -4 PM  Purpose: Addressing concepts of self-awareness. Intervention: Open guided discussion on knowledge of personality & character with objective perspective. Effect: Lisa [name] shared that while reality is based on belief systems, she concurred that mental illness is an "extreme form" of consensus reality.  Lisa [name] CPSS

## 2015-09-27 NOTE — Progress Notes (Signed)
Adult Psychoeducational Group Note  Date:  09/27/2015 Time:  5:00 PM  Group Topic/Focus: Building Self Esteem:   The Focus of this group is helping patients become aware of the effects of self-esteem on their lives, the things they and others do that enhance or undermine their self-esteem, seeing the relationship between their level of self-esteem and the choices they make and learning ways to enhance self-esteem. Emotional Education:   The focus of this group is to discuss what feelings/emotions are, and how they are experienced. Managing Feelings:   The focus of this group is to identify what feelings patients have difficulty handling and develop a plan to handle them in a healthier way upon discharge.  Participation Level:  Active  Participation Quality:  Appropriate, Attentive and Sharing  Affect:  Appropriate  Cognitive:  Alert  Insight: Limited  Engagement in Group:  Engaged and Improving  Modes of Intervention:  Activity, Clarification, Discussion, Education and Socialization  Additional Comments:  Patients were asked to describe their personal definition of self-awareness. Each patient was given asked to elaborate and offered positive feedback regarding their definitions. Therapist explained that self-awareness is the sense of having a clear perception of one's own weaknesses, thoughts, beliefs and motivations. Therapist explained that self-awareness allows you to be conscious and understanding about what contributes to your ability to make decisions whether they turn out to be positive or negative. Therapist further explained that self-awareness is the ability to gain introspection while recognizing oneself as an individual separate from others and even from one's own environment or circumstances.  Therapist instructed each group member to complete a handout of the "I Am From" poem template by [employer]. Each member was encouraged to recite their version of the poem after  inserting required wording onto the handout.  Members were affirmed for their willingness to share their poetry and thanked for allowing themselves to be vulnerable within the group setting. Therapist modeled the flow of the poem by reciting a pre-completed handout. Group members were asked to identify common family themes they shared with other members, to elaborate on family traditions, to explore positive family values instilled in themselves. Each member was asked to summarize one think they learned about other group members and their families that makes them special.    Carsynn Bethune 09/27/2015, 5:00 PM

## 2015-09-27 NOTE — Psych (Signed)
   [facility]  Arman FilterCorina Lall [id number]   Date: 09/27/15  Time: 11:00 AM-12:00 PM   Group Topic/Focus:  Fernanda DrumDonna Shelton from the [employer] presented group wtih information about the Association  Participation Level:  Active  Participation Quality:  Appropriate  Affect:  Appropriate  Cognitive:  Appropriate  Insight: Limited  Engagement in Group:  Engaged  Modes of Intervention:  Discussion and Education  Additional Comments: Fernanda DrumDonna Shelton from the [employer] presented the group with information about the association.  She described the mission is to provide leadership in identifying and addressing mental health needs and serves as an advocate for those dealing with mental challenges by promoting hope and recovery. She described some of the programs that include weekly support groups to families and drop-in center for consumers. There is a job [employer] skills training program, Support Source and a referral source.  They provide the most up-to-date information programs and services that better inform and provide support for patients and other members of the community. This group gave patients more information about support and resources for them that are available in the community.   Plan: 1.Patient learn and utilize resources that are available to her in the community.  [facility] PHP THERAPIST GROUP NOTE  Date: 09/27/15 Time: 12:30 PM-1:30 PM   Group Topic/Focus:  Peer Specialist led group on self-awareness and therapist also present  Participation Level:  Active  Participation Quality:  Attentive and Sharing  Affect:  Appropriate  Cognitive:  Appropriate  Insight: Limited  Engagement in Group:  Engaged  Modes of Intervention:  Discussion, Education, Exploration and Skills to Build Relationships, Coping Skills  Additional Comments: Peer Specialist led group on Self-Awareness and therapist also present. Patients  discussed how self-awareness could assist them in their recovery process. Peer Specialist explained that self-awareness is the ability to take an honest look at your life without any attachment to it being right or wrong, good or bad.  Patients discussed how self-awareness does not have to mean self-approval. We begin to improve only when we recognize our flaws. Peer Specialist emphasized in particular one of the issues for the group and that is skills for establishing healthy relationships. Group discussed how it is important in our support system to know boundaries. It is important to communicate your needs because people will be clueless unless you express it. Patients have a responsibility to express how they feel and not leave room for misperceptions. Patient identified her pessimistic thinking as a flaw. Therapist pointed out that this was positive as she realizes that she has to work on negative perspectives.   Plan: 1.Patient use skills of self-awareness to work on progress in her treatment goals.   Diagnosis: Primary Diagnosis: Bipolar I disorder, most recent episode depressed (HCC) [F31.30]    1. Bipolar I disorder, most recent episode depressed (HCC)       [name],[name] 09/27/2015

## 2015-09-28 ENCOUNTER — Other Ambulatory Visit (HOSPITAL_COMMUNITY): Payer: BLUE CROSS/BLUE SHIELD | Admitting: Licensed Clinical Social Worker

## 2015-09-28 DIAGNOSIS — F319 Bipolar disorder, unspecified: Secondary | ICD-10-CM | POA: Diagnosis not present

## 2015-09-28 DIAGNOSIS — F3130 Bipolar disorder, current episode depressed, mild or moderate severity, unspecified: Secondary | ICD-10-CM

## 2015-09-28 NOTE — Psych (Signed)
   [facility]  [name] [id number]   Date: 09/28/15 Time: 11:00 AM-12:00 PM  Group Topic/Focus:  Coping Skills that Patients have Learned in PHP  Participation Level:  Active  Participation Quality:  Attentive and Sharing  Affect:  Appropriate  Cognitive:  Appropriate  Insight: Improving  Engagement in Group:  Engaged  Modes of Intervention:  Discussion, Education and Coping Skills  Additional Comments: Therapist led session on important coping skills that patients have learned in PHP. They identified important insights such as not letting things get so bad, not behaving in ways that feed into worsening symptoms, addressing self-esteem issues, and not avoiding but addressing their issues. Patient agreed that talking about their thoughts and feelings helps them to feel better. It helps to normalize their feelings so that what their feeling doesn't feel so bad, helps them to change perspective so that their life doesn't always have to be how they experience it right now and that things can change. Lisa GuppyCorina said that she pushed herself to not fall back into behaviors that did not help her feel better such as staying in bed and countering her feelings of being lazy. She said doing things helped her not feel so bad. She says she talked to her sister and family support and encouragement is helping her to feel better and for her self-esteem to improve. She relates endorsed recognizing her strengths and things do not always have to be this way. She reports that she still needs to work on self-esteem.   [facility] PHP THERAPIST GROUP NOTE  Date: 09/28/15  Time: 12:30 PM-2:00 PM  Group Topic/Focus:  Peer Specialist Run Group on Maslow's Hierarchy  Participation Level:  Active  Participation Quality:  Attentive and Sharing  Affect:  Appropriate  Cognitive:  Appropriate  Insight: Improving  Engagement in Group:  Engaged  Modes of Intervention:  Discussion,  Education and Coping Skills  Additional Comments: Peer Specialist led group on Maslow's hierarchy of needs and therapist was also present. He related that we can go back and forth to higher and lower levels. Trauma can cause Lisa [name] to regress but that regression can serve a purpose. In regressing patients go back to a safe place and they can think of a safe place before they had mental health issues. It helps with regrouping, memories help to steer toward something, remind you of who you are, recovery is possible and life is worth living. Lisa GuppyCorina was able to reflect back on a positive time in her life as encouragement to have hope in the future.   Plan:1. Therapist continue to work with Lisa [name] on recognizing her strengths to build hope and self-esteem.    Diagnosis: Primary Diagnosis: Bipolar I disorder, most recent episode depressed (HCC) [F31.30]    1. Bipolar I disorder, most recent episode depressed (HCC)       Oliver Heitzenrater A 09/28/2015

## 2015-09-28 NOTE — Progress Notes (Signed)
09/28/15 Encounter: 12:30 - 4 PM  Purpose: Addressing Maslow's ""Hierarchy of Needs". Intervention: Open guided discussion on how trauma is a recovery process. Effect: Kaimana related how for her, hope is sustained by past experience.  Donnamarie Rossettiodd Suheyla Mortellaro CPSS

## 2015-09-28 NOTE — Progress Notes (Signed)
Adult Psychoeducational Group Note  Date:  09/28/2015 Time:  5:28 PM  Group Topic/Focus: Building Self Esteem:   The Focus of this group is helping patients become aware of the effects of self-esteem on their lives, the things they and others do that enhance or undermine their self-esteem, seeing the relationship between their level of self-esteem and the choices they make and learning ways to enhance self-esteem. Recovery Goals:   The focus of this group is to identify appropriate goals for recovery and establish a plan to achieve them.  Participation Level:  Active  Participation Quality:  Appropriate and Attentive  Affect:  Appropriate  Cognitive:  Alert and Appropriate  Insight: Limited  Engagement in Group:  Developing/Improving  Modes of Intervention:  Activity, Discussion, Exploration, Problem-solving, Role-play and Socialization  Additional Comments:  Patients were taught and asked to demonstrate their problem-solving skills. Patients were asked to define a problem clearly, brainstorming multiple solutions, role-playing, listing the pros and cons of each solution, seeking input from others, selecting and implementing a plan of action, and evaluating and readjusting the outcome. Patients were asked to discuss scenarios in which they have been prone to social anxiety. Clients were assisted with exploring the stressors and creating coping skills applicable to the issue. Patients were asked to discuss, in detail, one large obstacle they would like to overcome. Each patient goal was explored, accessed for risk, problem solved and verbalized as a commitment by the patient. Patient was able to display a clear understanding of the use of problem-solving skills as necessary to decrease anxiety symptoms.     Lisa [name] 09/28/2015, 5:28 PM

## 2015-09-29 ENCOUNTER — Other Ambulatory Visit (HOSPITAL_COMMUNITY): Payer: BLUE CROSS/BLUE SHIELD | Admitting: Licensed Clinical Social Worker

## 2015-09-29 DIAGNOSIS — F319 Bipolar disorder, unspecified: Secondary | ICD-10-CM | POA: Diagnosis not present

## 2015-09-29 DIAGNOSIS — F3130 Bipolar disorder, current episode depressed, mild or moderate severity, unspecified: Secondary | ICD-10-CM

## 2015-09-29 NOTE — Progress Notes (Signed)
Adult Psychoeducational Group Note  Date:  09/29/2015 Time:  4:55 PM  Group Topic/Focus: Conflict Resolution:   The focus of this group is to discuss the conflict resolution process and how it may be used upon discharge. Developing a Wellness Toolbox:   The focus of this group is to help patients develop a "wellness toolbox" with skills and strategies to promote recovery upon discharge. Overcoming Stress:   The focus of this group is to define stress and help patients assess their triggers. Rediscovering Joy:   The focus of this group is to explore various ways to relieve stress in a positive manner. Wellness Toolbox:   The focus of this group is to discuss various aspects of wellness, balancing those aspects and exploring ways to increase the ability to experience wellness.  Patients will create a wellness toolbox for use upon discharge.  Participation Level:  Active  Participation Quality:  Appropriate and Attentive  Affect:  Appropriate  Cognitive:  Alert  Insight: Improving  Engagement in Group:  Engaged  Modes of Intervention:  Activity, Clarification, Discussion, Education, Exploration and Problem-solving  Additional Comments:  Patients were taught and asked to demonstrate their problem-solving skills. Patients were asked to define a problem clearly, brainstorming multiple solutions, role-playing, listing the pros and cons of each solution, seeking input from others, selecting and implementing a plan of action, and evaluating and readjusting the outcome. Patients were asked to discuss scenarios in which they have been prone to social anxiety, negative self-talk and defeatist thinking patterns. Patients were assisted with exploring the stressors and creating coping skills applicable to the issue. Patients were shown five different ways to define a problem, process the positive and negative solutions, develop a plan of action, solicit assistance if needed, finally to decide upon and  execute a plan of action. Patients were asked to discuss, in detail, one large obstacle they would like to overcome. Each patient goal was explored, accessed for risk, problem solved and given feedback from the therapist. Patients were able to display a clear understanding of the use of problem-solving skills as necessary to decrease anxiety and depressive symptoms.     Lisa [name] 09/29/2015, 4:55 PM

## 2015-09-29 NOTE — Progress Notes (Signed)
Group Progress Note  Program:PHP  Group Time: 1.30pm-2.30pm   Participation Level: Active  Behavioral Response: Resistant and Cooperative  Type of Therapy:  Yoga Therapy Group  Summary of Progress: Counselor planned for Energizing mat class.  Reflected resistance of group today and processed w/ group resistance, acknowledging concerns, energy level, barriers today.  Invited group to join at level comfortable but still being present w/ group- not distracting with another activity.  Counselor modeled flexibility when space would typically use wasn't accessible to complete a mat class.  Discussed options for meditation and mindful relaxation and provided group choice.  Counselor led through guided mediation prompting to set an intention for self related to goal for today.  Meditation involved use of imagery of light, awareness through use of sense to be present to internal and external and then completed w/ a progressive body relaxation.  Focused on skills of nonjudging self and mindful.   Pt initially expressed she was tired and ached.  Pt reported that she wasn't dressed for yoga.  Pt was willing to participate and expressed interest in meditation.  Pt reported that she really enjoyed today's practice and was beneficial in relaxing. Pt inquired more about setting an intention and asked for examples.  Pt reported she struggled to identify one for herself.    Forde RadonLeanne Guida Asman, LPC

## 2015-09-29 NOTE — Progress Notes (Signed)
09/29/15 Encounter: 12:30 - 4:30 PM  Purpose: Addressing the concept o "locus of control". Intervention: Open guided discussion on personal ideas of what degree of control we feel we have in our lives. Effect: Jalene shared how viewing life through a "negative lens" prevents positive thought.  Donnamarie Rossettiodd Tramane Gorum CPSS

## 2015-09-29 NOTE — Psych (Signed)
   [facility]  [name] [id number]   Date: 09/29/15   Time: 12:30 PM-2:00 PM   Group Topic/Focus:  Peer Specialist run group on "Locus of Control" and therapist present  Participation Level:  Active  Participation Quality:  Attentive and Sharing  Affect:  Appropriate  Cognitive:  Appropriate  Insight: Improving  Engagement in Group:  Engaged  Modes of Intervention:  Discussion, Education, Exploration and coping Skills  Additional Comments: Peer Specialist led group on "Locus of Control" and therapist was also present. Patients learned that this means how much control one feels over their environment and that when a person has mental health issues, they can feel that they lose control and their power is taken away. When you feel that there is an internal locus of control you feel that the power is in your hands. When your feel that there is an external locus of control you feel that the power is outside of your hands. Patients can learn to be empowered and start to increase their internal locus of control. You can create a list and work at it slowly to build up your person power. Ask yourself "How can I change myself in the world one step at a time". Patients were then asked how they can allow themselves to feel their personal power? Patient reported that learning and then teaching what she learns is a way for her to gain her personal power.   Plan: 1. Patient continue to take positive steps to help increase her sense of personal power in the environment.   Diagnosis: Primary Diagnosis: Bipolar I disorder, most recent episode depressed (HCC) [F31.30]    1. Bipolar I disorder, most recent episode depressed (HCC)       [name],[name] 09/29/2015

## 2015-09-30 ENCOUNTER — Other Ambulatory Visit (HOSPITAL_COMMUNITY): Payer: BLUE CROSS/BLUE SHIELD | Admitting: Psychiatry

## 2015-09-30 DIAGNOSIS — F319 Bipolar disorder, unspecified: Secondary | ICD-10-CM | POA: Diagnosis not present

## 2015-09-30 DIAGNOSIS — F3130 Bipolar disorder, current episode depressed, mild or moderate severity, unspecified: Secondary | ICD-10-CM

## 2015-09-30 IMAGING — CT CT HEAD W/O CM
2 series · 16 of 30 positions shown, 20 images · non-contrast
Comparison: None.

CLINICAL DATA: Psychosis.

EXAM:
CT HEAD WITHOUT CONTRAST
TECHNIQUE: Contiguous axial images were obtained from the base of the skull
through the vertex without intravenous contrast.

[Series 2: head w/o · axial · non-contrast · 0.48mm/px · z∈[+1281,+1406]mm · 13 of 31 slices shown, 17 images]
[im 3/31  brain]
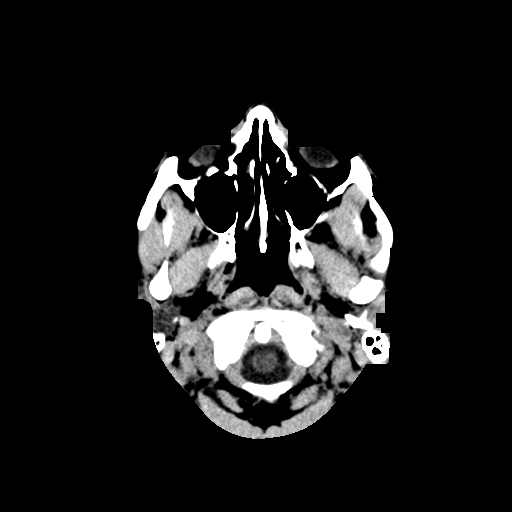
[im 3/31  bone]
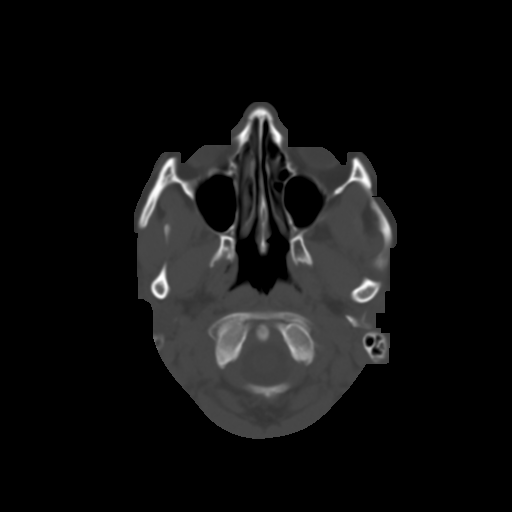
[im 5/31  brain]
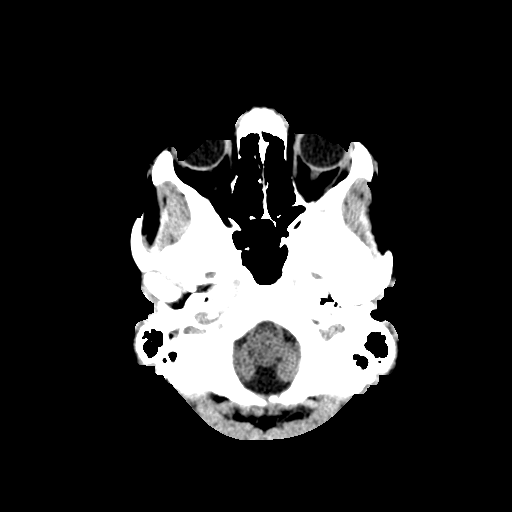
[im 7/31  brain]
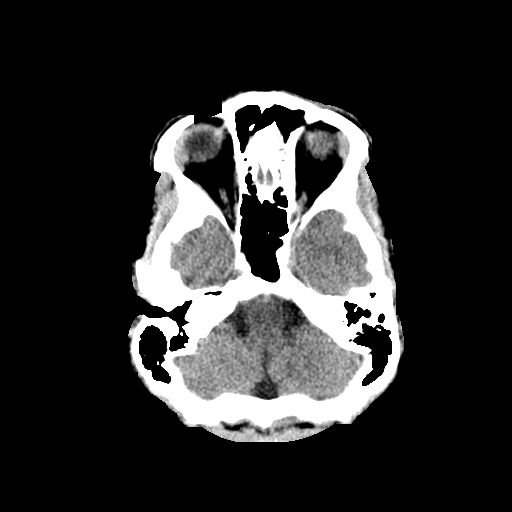
[im 9/31  brain]
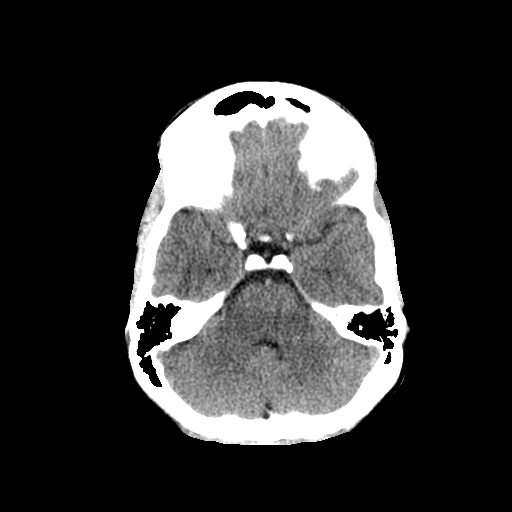
[im 11/31  brain]
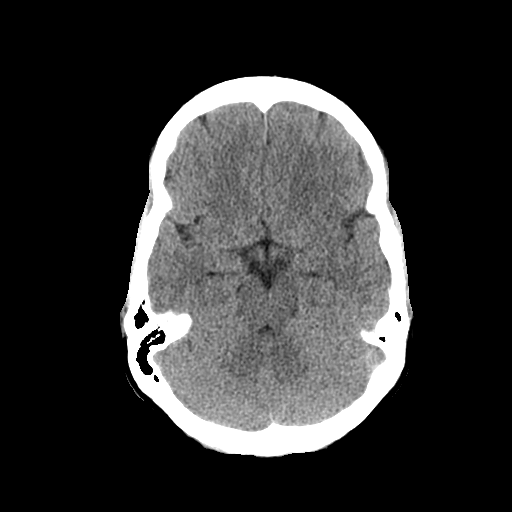
[im 11/31  bone]
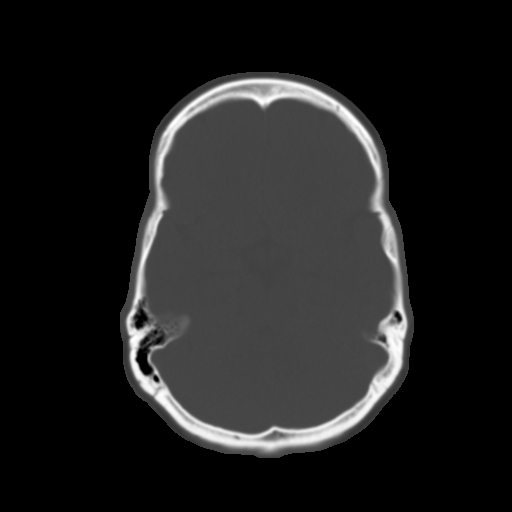
[im 13/31  brain]
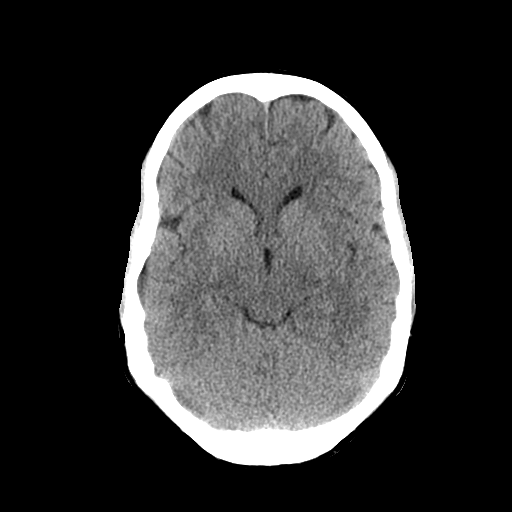
[im 16/31  brain]
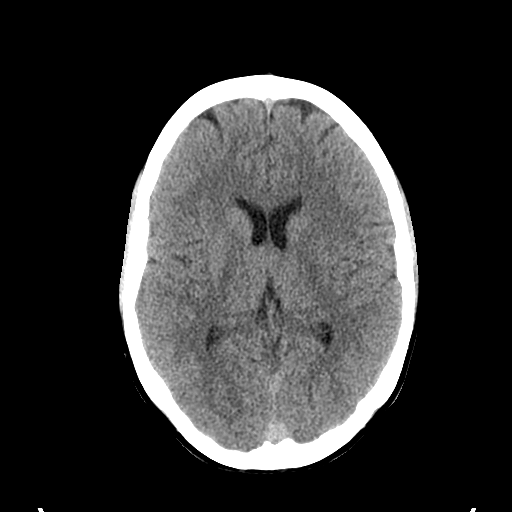
[im 18/31  brain]
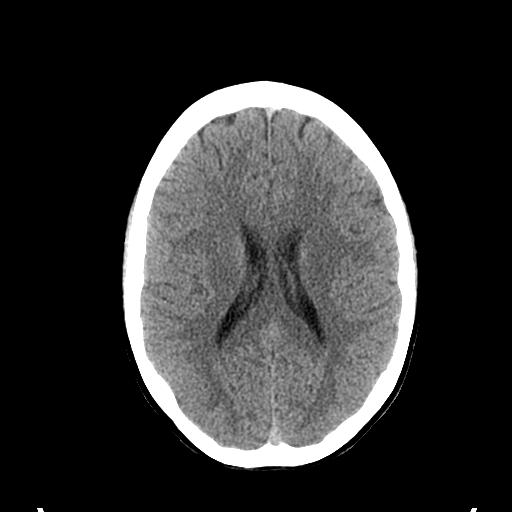
[im 20/31  brain]
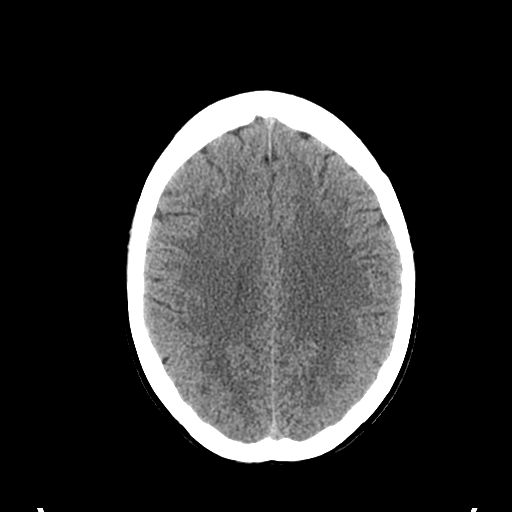
[im 20/31  bone]
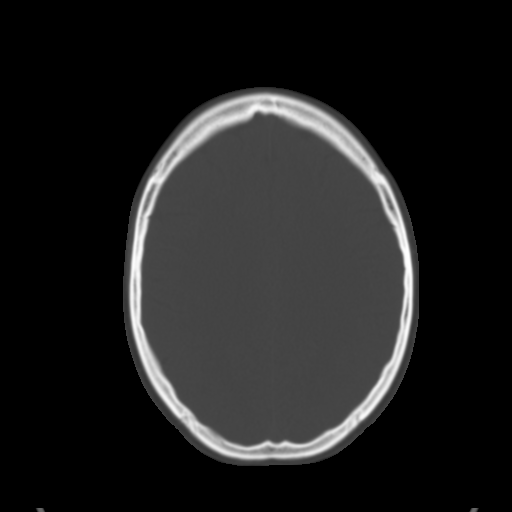
[im 22/31  brain]
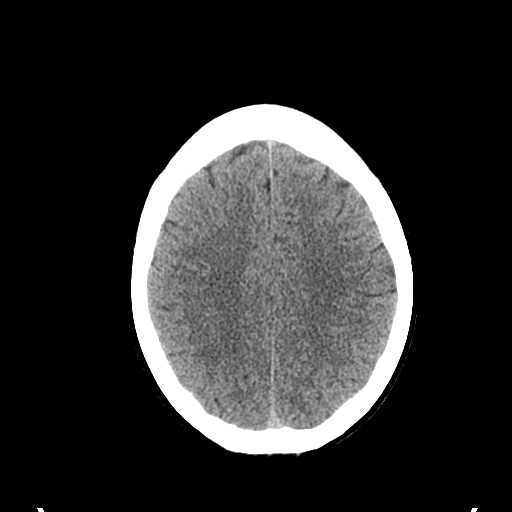
[im 24/31  brain]
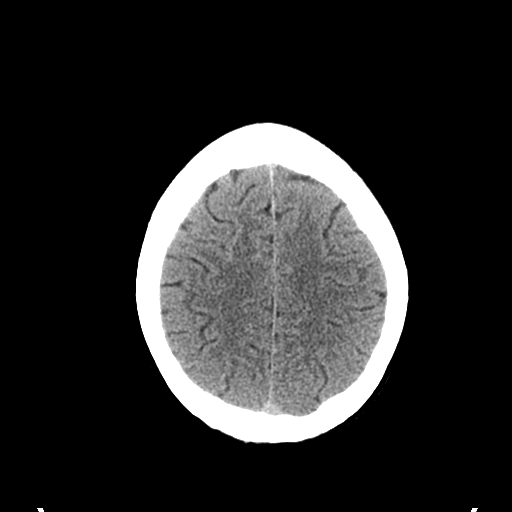
[im 26/31  brain]
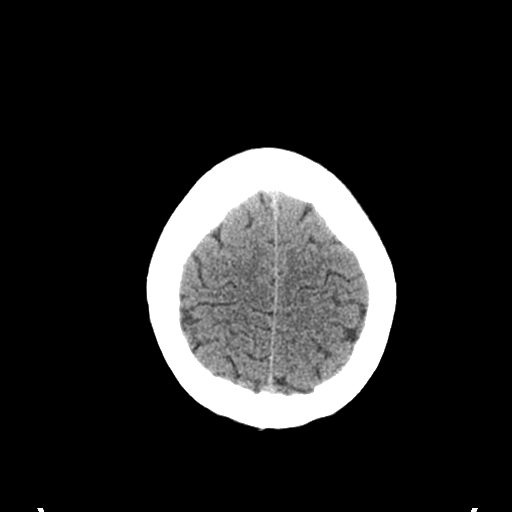
[im 28/31  brain]
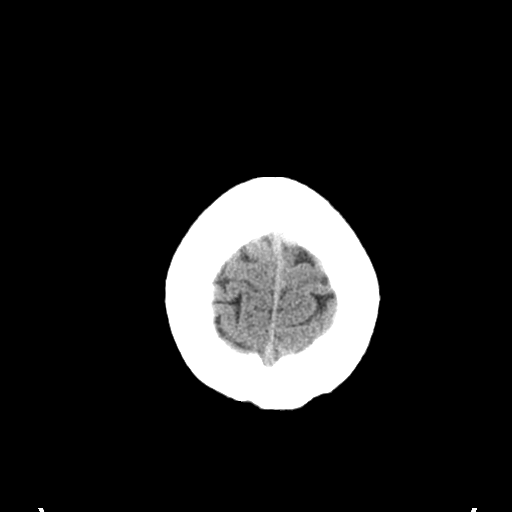
[im 28/31  bone]
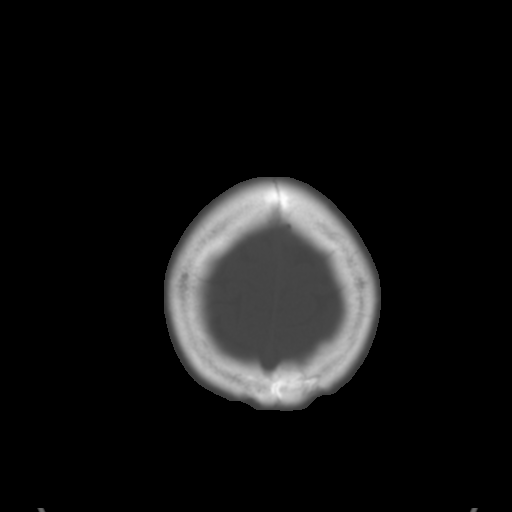

[Series 3: bone windows · axial · 0.48mm/px · z∈[+1281,+1321]mm · 3 of 31 slices shown]
[im 3/31  bone]
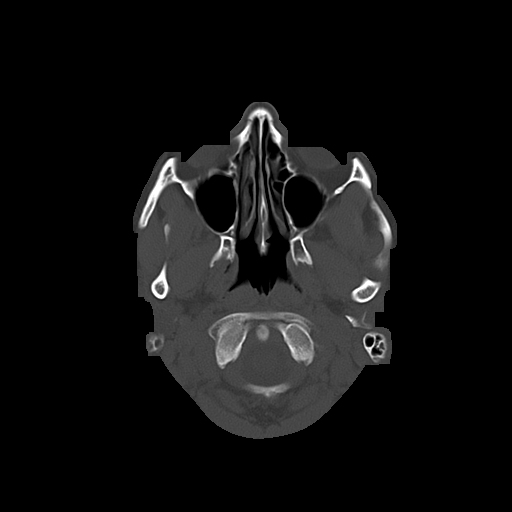
[im 7/31  bone]
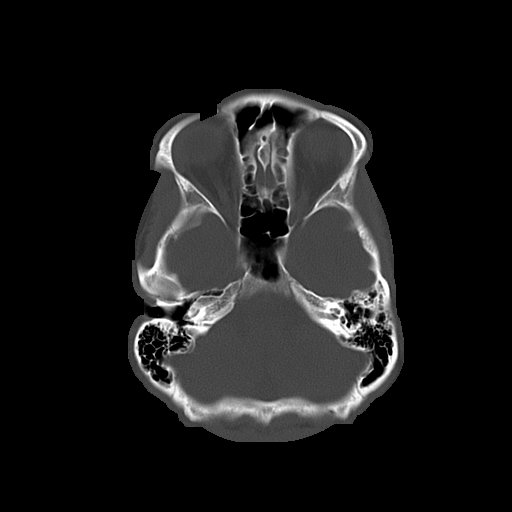
[im 11/31  bone]
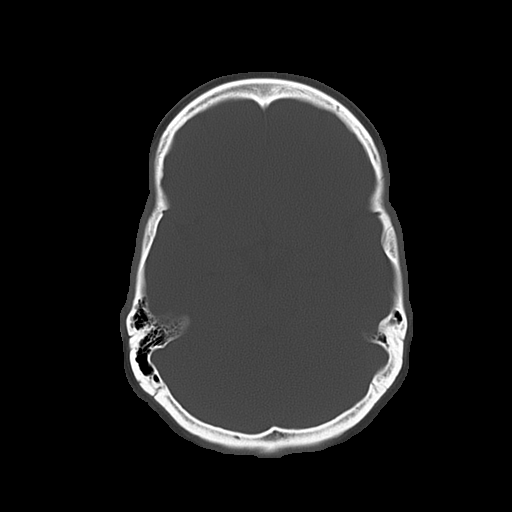

[16 of 30 positions shown; findings below may reference images not displayed]

FINDINGS: Bony calvarium is intact. No mass effect or midline shift is noted.
Ventricular size is within normal limits. There is no evidence of
mass lesion, hemorrhage or acute infarction.
IMPRESSION: No gross intracranial abnormality seen.

## 2015-09-30 NOTE — Psych (Signed)
   [facility]  Arman FilterCorina Foutz [id number]   Date: 09/30/15  Time: 10:00 AM-11:00 AM   Group Topic/Focus:  Group Topic Was Processing Current State of Recovery  Participation Level:  Active  Participation Quality:  Attentive and Sharing  Affect:  Appropriate  Cognitive:  Appropriate  Insight: Improving  Engagement in Group:  Engaged  Modes of Intervention:  Discussion, Education, Exploration and Coping Skills  Additional Comments: Group topic was on processing current state of recovery. Group started with a check in. Patients discussed their process in terms of rebranding and making a new and better you. The Therapist related the work of self in recovery to taking things step by step to create the structure for your building. Patients discussed how self-talk, moving to action and recognizing personal responsibility as helpful strategies. They also discussed some of the joys of coming to know themselves better as they shared some of their interests and thoughts about a career. Cheri GuppyCorina discussed that she had a job interview today and recognizes that this shows progress in her recovery.  Plan: 1.Cheri GuppyCorina continue to take steps to support her progress in her recovery.  [facility] PHP THERAPIST GROUP NOTE  Date: 09/30/15  Time: 11:00 AM-1:00 PM  Group Topic/Focus:  Peer Specialist led group on "Flexibility in Recovery"  Participation Level:  Active  Participation Quality:  Sharing  Affect:  Appropriate  Cognitive:  Appropriate  Insight: Improving  Engagement in Group:  Engaged  Modes of Intervention:  Activity, Discussion, Education, Exploration and Coping Skills  Additional Comments: Peer Specialist led group on "Flexibility in Recovery". He discussed the steps as adapting to things that are difficult. He then related that patient have to change their tools. One of the ways is to use your mental resources to make symptoms better and to make better choices.  Patients have to shift their perspective. This is done by reframing and one of the ways is to find ways to make a negative situation more lucrative. Finally, they have to find balance. In mental health they can get stuck in an emotional state and they have to learn to let go of unhealthy strategies and to introduce ones that are healthy. Cheri GuppyCorina shared that she is relearning how to feel. She is wants freedom from boredom and freedom to passion.    Plan: 1.Chanell use principles of flexibility to develop effective coping tools for her recovery.     Diagnosis: Primary Diagnosis: Bipolar I disorder, most recent episode depressed (HCC) [F31.30]    1. Bipolar I disorder, most recent episode depressed (HCC)       [name],[name] 09/30/2015

## 2015-10-03 ENCOUNTER — Other Ambulatory Visit (HOSPITAL_COMMUNITY): Payer: BLUE CROSS/BLUE SHIELD | Admitting: Licensed Clinical Social Worker

## 2015-10-03 DIAGNOSIS — F3130 Bipolar disorder, current episode depressed, mild or moderate severity, unspecified: Secondary | ICD-10-CM

## 2015-10-03 DIAGNOSIS — F323 Major depressive disorder, single episode, severe with psychotic features: Secondary | ICD-10-CM

## 2015-10-03 NOTE — Progress Notes (Signed)
Adult Psychoeducational Group Note  Date:  10/03/2015 Time:  2:30pm  Group Topic/Focus: Early Warning Signs:   The focus of this group is to help patients identify signs or symptoms they exhibit before slipping into an unhealthy state or crisis. Emotional Education:   The focus of this group is to discuss what feelings/emotions are, and how they are experienced. Primary and Secondary Emotions:   The focus of this group is to discuss the difference between primary and secondary emotions.  Participation Level:  Active  Participation Quality:  Attentive  Affect:  Appropriate  Cognitive:  Alert  Insight: Good  Engagement in Group:  Engaged  Modes of Intervention:  Activity, Clarification, Discussion, Education and Exploration  Additional Comments:  Patients were asked to develop, discuss and complete a list of negative (bad thoughts) worst case scenarios (ugly thoughts) and to develop a list of positive reframes (good thoughts).  Therapist asked each member to contribute a scenario in which all members were allowed to give input and feedback on the situation. Members were praised for being supportive and assisting other members with developing new thought patterns. Cost-benefit analyses of mental filters were asked to access the advantages vs. disadvantages of anxiety producing situations that lead to panic attacks and symptoms of depression.  Therapist explained that positive thoughts inspire change and challenge which lead to more positive experiences while negative thoughts result in "feeling stuck" which contributes to feelings of sadness, hopelessness, depression and anxiety. Therapist examined automatic thoughts and identified self-defeating patterns. Therapist aided members in developing insight regarding the use of positive thoughts daily. Therapist explained it as a skill that has to be practiced daily to see results. Therapist compared practice and training to skills relevant in each  person's lives. Therapist educated members in the premise that automatic negative self-talk leads to feelings of low-self-esteem and positive self-talk leads to development of optimism, healthy self-image and being able to handle criticism, problem solve and life's disappointments in a healthy way.    Lisa [name] 10/03/2015, 2:30pm

## 2015-10-03 NOTE — Progress Notes (Signed)
Adult Psychoeducational Group Note  Date:  10/03/2015 Time:  12:45pm  Group Topic/Focus: Conflict Resolution:   The focus of this group is to discuss the conflict resolution process and how it may be used upon discharge. Healthy Communication:   The focus of this group is to discuss communication, barriers to communication, as well as healthy ways to communicate with others. Making Healthy Choices:   The focus of this group is to help patients identify negative/unhealthy choices they were using prior to admission and identify positive/healthier coping strategies to replace them upon discharge.  Participation Level:  Active  Participation Quality:  Appropriate and Attentive  Affect:  Anxious and Appropriate  Cognitive:  Alert  Insight: Improving  Engagement in Group:  Engaged  Modes of Intervention:  Activity, Clarification, Discussion, Education and Problem-solving  Additional Comments:  Therapist offered each group member a handout with a list of Cognitive Distortions. Therapist encouraged each group member to identify three cognitive distortions that pre-dominate most of their thought patterns. Therapist explained that cognitive distortions are ways that our mind convinces us to believe in something that isn't really true. Furthermore, these inaccurate thoughts are usually used to reinforce one's negative thinking or emotions which tell you that these sorts of thoughts are rational and accurate.  These negative thought patterns are what increases feelings of hopelessness, helplessness, sadness and facilitate depressive and anxious thoughts.  Therapist instructed group members with processing negative thought patterns to formulate positive solutions. Therapist taught patients to challenge negative thoughts and to reframe them into positive solutions. Patients were taught how to create decision trees to explore negative thoughts.    Kari Montero 10/03/2015, 12:45pm

## 2015-10-03 NOTE — Progress Notes (Signed)
Adult Psychoeducational Group Note  Date:  10/03/2015 Time:  4:37 PM  Group Topic/Focus: Coping With Mental Health Crisis:   The purpose of this group is to help patients identify strategies for coping with mental health crisis.  Group discusses possible causes of crisis and ways to manage them effectively. Emotional Education:   The focus of this group is to discuss what feelings/emotions are, and how they are experienced. Self Esteem Action Plan:   The focus of this group is to help patients create a plan to continue to build self-esteem after discharge. CBT: The focus of cognitive therapy seeks to change problem behaviors by changing the thoughts that cause and perpetuate them. The focus at first is usually on identifying and challenging dysfunctional/irrational Automatic Thoughts which lead to self-defeating emotional and behavioral responses.   Participation Level:  Active  Participation Quality:  Attentive  Affect:  Anxious and Appropriate  Cognitive:  Appropriate  Insight: Good and Improving  Engagement in Group:  Engaged and Improving  Modes of Intervention:  Activity, Discussion, Education, Exploration and Problem-solving  Additional Comments:  Patients were presented with an activity which identified the CBT process of situation=thoughts=emotions=behaviors. Therapist identified several examples from patients' lives to assist with explanation of the model. Therapist educated clients on how the CBT model can become cyclical based upon the positive or negative behavior displayed by the client. Therapist explored examples of situations and life events to facilitate individual understanding.  Therapist noted that during discussions many of the patients made decisions based on lack of confidence, low self-esteem, and negative past experiences. Therapist gave feedback on individual patient responses. . Therapist encouraged patients to identify and verbalize positive outcomes about their  situations. Therapist assisted patients and solicited input from other group members when a member appeared reluctant, despondent or incapable of verbalizing positive outcomes or lack of insight regarding their personal situations.    Lisa [name] 10/03/2015, 4:37 PM

## 2015-10-04 ENCOUNTER — Other Ambulatory Visit (HOSPITAL_COMMUNITY): Payer: BLUE CROSS/BLUE SHIELD | Attending: Psychiatry | Admitting: Licensed Clinical Social Worker

## 2015-10-04 DIAGNOSIS — F259 Schizoaffective disorder, unspecified: Secondary | ICD-10-CM | POA: Insufficient documentation

## 2015-10-04 DIAGNOSIS — F3130 Bipolar disorder, current episode depressed, mild or moderate severity, unspecified: Secondary | ICD-10-CM

## 2015-10-04 NOTE — Progress Notes (Signed)
Adult Psychoeducational Group Note  Date:  10/04/2015 Time:  5:12 PM  Group Topic/Focus: Building Self Esteem:   The Focus of this group is helping patients become aware of the effects of self-esteem on their lives, the things they and others do that enhance or undermine their self-esteem, seeing the relationship between their level of self-esteem and the choices they make and learning ways to enhance self-esteem. Emotional Education:   The focus of this group is to discuss what feelings/emotions are, and how they are experienced. Overcoming Stress:   The focus of this group is to define stress and help patients assess their triggers. Spirituality:   The focus of this group is to discuss how one's spirituality can aide in recovery.  Participation Level:  Active  Participation Quality:  Appropriate and Attentive  Affect:  Appropriate  Cognitive:  Alert  Insight: Improving  Engagement in Group:  Engaged  Modes of Intervention:  Activity, Education, Exploration, Dance movement psychotherapisteality Testing and Support  Additional Comments:  Patients were asked to view a 20 minute Ted Talk from [employer]ristin Neff entitled "The Space between Self Esteem and Self Compassion." Each patient was asked to complete and rate two surveys.  The first survey asked questions regarding how compassionate one would be to themselves and how compassionate and the other with how compassionate one would be towards others. Therapist and patients both discussed and explored concepts taken from the video such as: including yourself in the circle of compassion, the effects of low vs. high self-esteem and how its manifested, the concept of "bullying thoughts", Mindfulness, realization that being imperfect is part of being human, different ways we treat ourselves, the attack and counteract we have internally within our own thought patterns and the detriment of self-criticism.  Patients were taught self-compassion techniques. Therapist demonstrated with the  group how to perform these techniques on themselves. Therapist taught group members how to "self-hug" and to utilize positive affirmations during this activity. Patients were shoun how to create an Eco-Map of compassion and to access areas in which compassion shown towards others can be practiced with oneself.    Damarious Holtsclaw 10/04/2015, 5:12 PM

## 2015-10-04 NOTE — Psych (Signed)
   [facility]  Arman FilterCorina Grabert [id number]   Date: 10/04/15  Time: 11:00 AM-12:30 PM   Group Topic/Focus:  How family members can be supportive of Patient's mental health issues  Participation Level:  Active  Participation Quality:  Attentive and Sharing  Affect:  Appropriate  Cognitive:  Appropriate  Insight: Improving  Engagement in Group:  Engaged  Modes of Intervention:  Discussion, Education, Exploration, Problem-solving, Support and Approaches to family therapy  Additional Comments:  Group topic related to how family members can be supportive to patients who have been diagnosed with a mental health diagnosis. Group started with a check in. The discussion related to how some family members lack knowledge of mental health as an illness and that they are unsure of how to be supportive. Through discussion patients came to the insight that the process of healing takes time and that family members needs to realize that patients need to take things at a pace where they do not feel overwhelmed. Patients agreed that having a dialogue of what they needed would be helpful and a family meeting would offer that opportunity. It also is not up to the supports to be the sole provider of their happiness but that it is up to patients to meet their needs and find their source of happiness. Cheri GuppyCorina recognizes that she can have a tendency to rush things but that she needs to take things slower. She also says that she wants to get a job to keep busy and will help her find meaning and purpose. She processed with therapist greater insight and acknowledged that a job will not solve everything and will not be the complete answer to find purpose. Therapist encouraged her to use this time to explore here interests and what she wants to pick as her specialization when she goes back to school.  Plan: 1.Cheri GuppyCorina continue to gain insight as to healthy coping skills.   [facility] PHP THERAPIST GROUP  NOTE  Date: 10/04/15   Time: 1:00 PM-2:00 PM   Group Topic/Focus:  DBT-Mindfulness and Radical Acceptance  Participation Level:  Active  Participation Quality:  Attentive and Sharing  Affect:  Appropriate  Cognitive:  Appropriate  Insight: Improving  Engagement in Group:  Engaged  Modes of Intervention:  Activity, Discussion, Education, Exploration and DBT  Additional Comments: Therapist introduced concepts of mindfulness and radical acceptance. Patients learned that mindfulness is the ability to be aware of your thoughts, emotions, physical sensations, and actions-in the present moment-without judging or criticizing yourself or your experience. Therapist explained that judging oneself or others is a source of negative emotions and when judging one is not able to be in the present moment. Therapist explained that mindfulness helps you to be more of an observer of your emotions, thoughts and actions so you have space from them and have more control over them. Being in the present moment helps the focus better on their daily experiences and make healthier choices in their life. Cheri GuppyCorina recognizes that she can be judgmental with herself about her future and her past. Therapist pointed this out as one of the main issues that has led to negative emotion.   Plan: Cheri GuppyCorina continue to learn healthy strategies to manage emotions.     Diagnosis: Primary Diagnosis: Bipolar I disorder, most recent episode depressed (HCC) [F31.30]    1. Bipolar I disorder, most recent episode depressed (HCC)       [name],[name] 10/04/2015

## 2015-10-04 NOTE — Progress Notes (Signed)
09/30/15  Family Meeting - Progress Note   Duration - 40  minutes   Attendees - patient, patient's parents  Therapists (Ms. [name]), Writer   Diagnosis- Schizoaffective Disorder   Family meeting at patient's and parents' request. We reviewed patient's clinical improvement , progress, and parents agree that patient seems much improved compared to prior presentation. At this time patient stable, functioning well in daily activities , working on getting a job, has been going to job interviews, planning on returning to college next semester. She has been living with parents, and they report that she has been doing better, is functioning well in daily activities . At their request we discussed diagnostic considerations - patient has history suggestive of Schizoaffective Disorder - and strategies to minimize risk of relapses / recurrences . We reviewed importance of medication compliance ( patient states she has been taking medications as prescribed ) , outpatient psychiatric management ( she follows with Dr. Jannifer FranklinAkintayo for psychiatric management ) and avoidance of illicit drugs, to include cannabis ( patient has history suggestive of cannabis abuse  contributing to decompensations in the past ) . Overall family meeting went well, and patient's parents reiterated patient seems much improved and currently stable and their ongoing support for patient .   Regarding medication issues, patient had recently stopped Zoloft, as she reported she felt affectively numbed by this medication. She states she feels better off it , and currently does not endorse any affective numbing . We have reviewed medication side effects, to include risk of metabolic disturbances, weight gain, sedation, movement disorders from Seroquel, and severe rashes from Lamictal. At this time patient not endorsing any of these side effects, denies significant weight gain and is not presenting with akathisia. She presents euthymic with full  range of affect at this time.   Current Medications-  Lamictal 25 mgrs QAM and 50 mgrs QHS , Seroquel 200 mgrs QHS   MSE - alert and attentive, well groomed, Good eye contact, speech is normal in tone, volume and rate,  no psychomotor agitation or restlessness, mood currently euthymic , affect full in range at this time. There is no thought disorder. She denies any current SI or HI, she denies hallucinations and does not appear internally preoccupied , no delusions expressed. She is future oriented, 0x3 .   Assessment- Patient parents and patient corroborate current clinical improvement and stability. Patient insightful about mental illness and importance of medication compliance and avoiding illicit drug abuse . Currently presents euthymic. She had reported feeling emotionally numb on Zoloft trial. States this has resolved since she stopped the Zoloft and at this time presents with full range of affect .  Plan-  [facility] plan to continue ongoing outpatient psychiatric management with Dr. Jannifer FranklinAkintayo.   Continue Lamictal to 25 mgrs QAM and 50 mgrs QHS Continue Seroquel 200 mgrs QHS .      [name] Cobos, MD

## 2015-10-05 ENCOUNTER — Other Ambulatory Visit (HOSPITAL_COMMUNITY): Payer: BLUE CROSS/BLUE SHIELD | Admitting: Licensed Clinical Social Worker

## 2015-10-05 DIAGNOSIS — F259 Schizoaffective disorder, unspecified: Secondary | ICD-10-CM | POA: Diagnosis not present

## 2015-10-05 DIAGNOSIS — F3130 Bipolar disorder, current episode depressed, mild or moderate severity, unspecified: Secondary | ICD-10-CM

## 2015-10-05 MED ORDER — QUETIAPINE FUMARATE 200 MG PO TABS: 200.0000 mg | ORAL_TABLET | Freq: Every day | ORAL | Status: DC

## 2015-10-05 MED ORDER — LAMOTRIGINE 25 MG PO TABS: ORAL_TABLET | ORAL | Status: DC

## 2015-10-05 NOTE — Psych (Signed)
[facility] Partial Hospitalization Program Psych Discharge Summary  Lisa [name] [id number]  Admission date: 09/12/15 Discharge date: 10/05/15  Reason for admission: Patient was known to this therapist for recent PHP admission in July/2016.  She was 24 year old single female, who had a history of Bipolar Spectrum Disorder, with several prior psychiatric admissions starting in 2014. She was following for aftercare to the [facility] program from recent [facility] admission due to worsening depression and suicidal ideations. She has had prior psychotic symptoms but did not have any psychotic symptoms during her most recent mood episode. She had been diagnosed with Bipolar Disorder, with previous episodes of psychosis associated with mood decompensations. At the time of admission to [facility] she was doing better, mood was improved, relatively euthymic, with an appropriate range of affect. Of note, prior mood episodes, decompensations appeared to be partly related to cannabis abuse - she reported she was more aware of this and that she has abstained from cannabis since discharge from hospital. Of note, patient had history of abusing cannabis, and her initial decompensation 3-4 years ago occurred in the context of regular cannabis use. She had smoked cannabis prior to her recent decompensation as well. She denied any drug or alcohol use since her discharge. Prior psychiatric medication trials had included Abilify, but patient has stated Seroquel has seemed more effective and denied having had any significant side effects from it thus far.   Progress in Program Toward Treatment Goals: Patients goals in treatment included 1)she wanted to be able to manage her depressive symptoms a lot better and 2)She wanted to be able to make through a shift without feeling overwhelmed. At the beginning of treatment, patient expressed feelings of numbness and blunted affect that she contributed to feelings of depression. She explained  that she felt embarrassed about her episodes of psychosis and associated with her symptoms was a feeling of loss because of the impact of her mental health issues to her life. She had been doing well in school but her psychotic symptoms had shaken her confidence since it meant she had to withdraw from school and had also had caused her embarrassment for things she had done. Lisa [name] was challenged on her perspective, look at the evidence of her beliefs and to look at her distortions in her perspective. She was asked to look at this phase of her life as a transition, to question her negative assessment of herself and to recognize that she had not lost the skills and abilities she had accomplished. She also gained insight of the negative judgements that she placed on herself, that "she was a bully" to herself and recognize that this increased her negative emotions. Lisa [name] gained insight that she needed to work on her self-esteem and that the work in the [employer] had helped and at the same time that she would need to continue to work on improving her self-esteem. She related that she did become more hopeful as she felt that time was helping her to let the past go and helped her to work through her feelings of loss and that the support of her family in building hope was also helpful. One of the issues in increasing depressive symptoms was her feelings that she was not engaged in activities and that fed into feelings of purposelessness and lack of meaning in her life. She gained insight that behavior change such as increase in motivation and interest in activities were important in improving her mood. There were positive steps as she  presented as future oriented, she had gone to a church social gathering, had played in cello with a orchestra, and had reported she had started applying for jobs. She did take some positive steps but while she had developed more insight as to coping skills, she will need to continue to  work at being more proactive in taking active steps to apply coping skills she has learned. Therapist also worked on building insight that finding purposefulness can not be found by simply keeping herself busy in a full time job but rather through pathways that include working on self-esteem, developing support, continuing to find ways to meet her needs and exploring her hopes and dreams.  Another factor that contributed to blunted affect appeared to be the Zoloft trial, on low dose - 25 mgrs QDAY. She described this not as depression or anhedonia, but rather as being emotionally numb. It appeared that the discontinuation of this medication coincided with improvement of mood.  A family meeting was held and patient's progress was reviewed. Parents agreed that patient seems much improved compared to prior presentation. She was functioning well in daily activities, working on getting a job, has been going to job interviews, planning on returning to college next semester. She has been living with parents, and they reported that she has been doing better and functioning well in daily activities.  Progress (rationale): Patient parents and patient corroborated clinical improvement and stability. Patient was insightful about mental illness and importance of medication compliance and avoiding illicit drug abuse. She presented as euthymic with no reported significant neuro-vegetative symptoms and without any symptoms of psychosis. She has also remained abstinent from cannabis, which may have played a contributing role in previous decompensations. She is started to be more future oriented but needs to continue to work on self-esteem, active application of coping strategies and be active and find healthy ways to find meaning and purpose in her life.     Discharge Plan: 1.Referral to Psychiatrist-Dr. Jannifer FranklinAkintayo 11/03/15 at 12:30 PM 2.Referral to Counselor/Psychotherapist-Leanne Yates 10/14/15 at 12:30 PM  Dx.  Bipolar  Disorder I , most recent  Depressed - consider also Schizoaffective Disorder, Bipolar type   Cannabis Abuse   [name],[name] 10/05/2015

## 2015-10-05 NOTE — Progress Notes (Signed)
Adult Psychoeducational Group Note  Date:  10/05/2015 Time:  5:29 PM  Group Topic/Focus: Building Self Esteem:   The Focus of this group is helping patients become aware of the effects of self-esteem on their lives, the things they and others do that enhance or undermine their self-esteem, seeing the relationship between their level of self-esteem and the choices they make and learning ways to enhance self-esteem. Conflict Resolution:   The focus of this group is to discuss the conflict resolution process and how it may be used upon discharge. Crisis Planning:   The purpose of this group is to help patients create a crisis plan for use upon discharge or in the future, as needed.  Participation Level:  Active  Participation Quality:  Attentive  Affect:  Anxious, Appropriate, Depressed and Flat  Cognitive:  Alert  Insight: Improving  Engagement in Group:  Engaged  Modes of Intervention:  Discussion, Education, Exploration, Problem-solving, Reality Testing and Support  Additional Comments:  Therapist instructed patients to routinely use the strategies that he/she has learned in therapy (e.g., cognitive restructuring, exposure).Therapist urged patients to find ways to build these new strategies into their lives as much as possible. Therapist encouraged patients to demonstrate ways in which they have incorporated coping strategies into their life and daily routines.  Techniques from acceptance and commitment therapy (ACT) were used to help patients face and deal with uncomfortable realities. Patients were asked to utilize a given scenario to identify and formulate acceptance towards situations in which they do not have complete control, has imperfection or needs to tolerate uncertainty and unpleasant emotions. Therapist assisted patients in processing and accepting uncomfortable situations and committing to utilizing reality based cognitive techniques to problem solve.   Lisa [name] endorses feelings  of ongoing sadness, helplessness and hopelessness related to returning to the environment in which she lives with her parents. She describes her home life as "crazy and depressing" and states she feels it contributes to feeling "even more sad and hopeless" on a daily basis. Therapist explored with her daily activities which reoccur in the home. Therapist challenged Lisa [name]'s cognitive distortion regarding her lack of motivation, as well as, Lisa [name]'s mindset that she should "just be fixed by now." Therapist brainstormed with her activities in which she can improve her feelings of emotional satisfaction by decreasing activities in which she has labeled as "depressive" Activities such as: going home early after group, sitting around the house and watching tv until she falls asleep. Therapist assisted her in developing a list of activities she would like to pursue and encouraged her to set small, daily goals so as not to overwhelm herself. The goals are to consist of one goal with two activities which will indicate the "what" and "where" of each individual goal intervention.Therapist encouraged her earlier to develop a list of resources in which to prepare before returning to school (last year of social work).  keep her mind busy and occupied but also to allow her to be active in managing her own recovery and wellness. Lisa [name] appeared at first reluctant but later expressed hopefulness towards the possibility of preparing for new learning activities (one of her values) that she would be able to implement before school begins next semester. Therapist accessed for increased depressive symptoms by administering both the Burn's and PHQ-9 scales. Pt scores were lower compared to initial intake score. Pt also expressed that she feels more feelings of frustration and discouragement because "I don't have anything to do"  than hopelessness. Therapist explored positives  in pt life and encouraged her to continue to look for the  positives that "will happen" as opposed to those that "did not happen" and are out of her control.   Arman Loy 10/05/2015, 5:29 PM

## 2015-10-05 NOTE — Psych (Signed)
   [facility]  [name] [id number]   Date: 10/05/15 Time:  11:00 AM-12:30 PM  Group Topic/Focus:  Practical Application of Radical Acceptance and Mindfulness  Participation Level:  Active  Participation Quality:  Attentive and Sharing  Affect:  Appropriate  Cognitive:  Appropriate  Insight: Improving  Engagement in Group:  Engaged  Modes of Intervention:  Discussion, Education, Exploration, Problem-solving and DBt  Additional Comments: Group topic related to patients working on Orthoptistpractical application of concept they learned yesterday about radical acceptance and being aware of your thoughts, emotions in the present moment without judging or criticizing yourself or your experience. Therapist pointed out that the first step to changing a problem is to recognize when the problem occurs. Patients learned to keep track of when you made the judgment, where you were, and what the positive or negative judgment was. The more quickly patients record them after they occur, the sooner radical acceptance will become a regular part of their life. In discussion patients brought up specific examples of negative judgements they recognize about themselves and these were challenged. Lisa [name] notices the negative judgements that she puts on her feelings of loss related to her past and to her self-appraisals for things like starting a new job. She was challenged by examining the evidence for negative judgement. This exercise helps raise awareness of how her negative judgements feed into her negative emotions.   Plan: 1.Cheri GuppyCorina continue to gain insight as to how her negative emotions continue and be active in applying coping strategies to help regulate emotions more effectively.     Diagnosis: Primary Diagnosis: Bipolar I disorder, most recent episode depressed (HCC) [F31.30]    1. Bipolar I disorder, most recent episode depressed (HCC)       [name] A 10/05/2015

## 2015-10-05 NOTE — Progress Notes (Signed)
Met with Dr. Parke Poisson who requested this nurse send in a new order for patient's prescribed Lamictal 25 mg, two in the morning and one in the evening, #75 with no refills and Seroquel $RemoveBefo'200mg'[id number]$ , one at bedtime, #30.  Lamictal e-scribed to patient's [facility] on [address] as authorized but Lamictal order printed.  Dr. Parke Poisson reviewed and signed Lamictal new order and this was given to patient.  Patient to follow up with Dr. Darleene Cleaver on 11/03/15 and agreed to call if any problems prior to that appointment or if any problems filling medications. Patient stable and reports being ready for discharge from [facility].  No suicidal or homicidal ideations or concerns at this time expressed.

## 2015-10-06 ENCOUNTER — Other Ambulatory Visit (HOSPITAL_COMMUNITY): Payer: Self-pay

## 2015-10-07 ENCOUNTER — Other Ambulatory Visit (HOSPITAL_COMMUNITY): Payer: Self-pay

## 2015-10-10 ENCOUNTER — Other Ambulatory Visit (HOSPITAL_COMMUNITY): Payer: Self-pay

## 2015-10-11 ENCOUNTER — Other Ambulatory Visit (HOSPITAL_COMMUNITY): Payer: Self-pay

## 2015-10-12 ENCOUNTER — Telehealth (HOSPITAL_COMMUNITY): Payer: Self-pay | Admitting: Licensed Clinical Social Worker

## 2015-10-12 ENCOUNTER — Other Ambulatory Visit (HOSPITAL_COMMUNITY): Payer: Self-pay

## 2015-10-13 ENCOUNTER — Other Ambulatory Visit (HOSPITAL_COMMUNITY): Payer: Self-pay

## 2015-10-14 ENCOUNTER — Other Ambulatory Visit (HOSPITAL_COMMUNITY): Payer: Self-pay

## 2015-10-17 ENCOUNTER — Other Ambulatory Visit (HOSPITAL_COMMUNITY): Payer: Self-pay

## 2015-10-18 ENCOUNTER — Other Ambulatory Visit (HOSPITAL_COMMUNITY): Payer: Self-pay

## 2015-10-19 ENCOUNTER — Other Ambulatory Visit (HOSPITAL_COMMUNITY): Payer: Self-pay

## 2015-10-20 ENCOUNTER — Other Ambulatory Visit (HOSPITAL_COMMUNITY): Payer: Self-pay

## 2015-10-21 ENCOUNTER — Other Ambulatory Visit (HOSPITAL_COMMUNITY): Payer: Self-pay

## 2015-10-24 ENCOUNTER — Other Ambulatory Visit (HOSPITAL_COMMUNITY): Payer: Self-pay

## 2015-10-25 ENCOUNTER — Other Ambulatory Visit (HOSPITAL_COMMUNITY): Payer: Self-pay

## 2015-10-26 ENCOUNTER — Encounter (HOSPITAL_COMMUNITY): Payer: Self-pay

## 2015-10-26 ENCOUNTER — Other Ambulatory Visit (HOSPITAL_COMMUNITY): Payer: Self-pay

## 2015-11-02 ENCOUNTER — Encounter (HOSPITAL_COMMUNITY): Payer: Self-pay | Admitting: Psychology

## 2015-11-02 ENCOUNTER — Ambulatory Visit (INDEPENDENT_AMBULATORY_CARE_PROVIDER_SITE_OTHER): Payer: BLUE CROSS/BLUE SHIELD | Admitting: Psychology

## 2015-11-02 DIAGNOSIS — F3130 Bipolar disorder, current episode depressed, mild or moderate severity, unspecified: Secondary | ICD-10-CM | POA: Diagnosis not present
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
## 2015-11-09 ENCOUNTER — Ambulatory Visit (HOSPITAL_COMMUNITY): Payer: Self-pay | Admitting: Psychiatry

## 2015-11-14 ENCOUNTER — Telehealth (HOSPITAL_COMMUNITY): Payer: Self-pay | Admitting: Psychology

## 2015-11-14 NOTE — Telephone Encounter (Signed)
Pt on cancellation list.  Counselor had opening for 11/15/15 at 9am and called offering to pt.  Pt declined informing she has to work tomorrow at [employer]am.  Counselor informed she would call if any other openings.

## 2015-11-16 ENCOUNTER — Telehealth (HOSPITAL_COMMUNITY): Payer: Self-pay | Admitting: Psychology

## 2015-11-16 NOTE — Telephone Encounter (Signed)
Called and left message offering pt appointment for 11/17/15 at either 9am or 11am.  Asked to call and schedule if interested.

## 2015-12-06 ENCOUNTER — Ambulatory Visit (INDEPENDENT_AMBULATORY_CARE_PROVIDER_SITE_OTHER): Payer: BLUE CROSS/BLUE SHIELD | Admitting: Psychology

## 2015-12-06 DIAGNOSIS — F3130 Bipolar disorder, current episode depressed, mild or moderate severity, unspecified: Secondary | ICD-10-CM | POA: Diagnosis not present

## 2015-12-06 NOTE — Progress Notes (Signed)
   THERAPIST PROGRESS NOTE  Session Time: 8.05am-8.57am  Participation Level: Active  Behavioral Response: Well GroomedAlertDepressed  Type of Therapy: Individual Therapy  Treatment Goals addressed: Diagnosis: Bipolar 1 D/O and goal 1./  Interventions: CBT, Strength-based and Supportive  Summary: [name] is a 25 y.o. female who presents with affect congruent w/ reports of depressed mood some days.  Pt reports that she is working 30-35 hours a week and this has been positive.  Pt reports constant low level anxiety- worrying about her future, where she "should be" at this point.  Pt reported she is looking into volunteer, practice w/ philharmonic, classes for personal growth, running group.  Pt reports she can start with some of these next week.  Pt aware to not take on too much as not healthy either. Pt identified cognitive distortions of judging that she isn't being productive/as far along as she should be.  Pt also struggles w/ shame of her actions during psychotic or manic episodes and avoiding those that she experienced at those times.  Pt was able to reframe and focus self about steps she is taking and building positive interactions w/ others.   Suicidal/Homicidal: Nowithout intent/plan  Therapist Response: Assessed pt current functioning per pt report.  Explored w/pt activities and interactions she is engaging in and assisted in refaming negative self talk.  Encouraged pt to focus on experiences in which she feels in control and acknowledge and be mindful to these.  Assisted pt in recognizing positive interactions she is having and not shaming self for past.   Plan: Return again in 2 weeks.  Diagnosis: Bipolar 1 D/O, most recent depressed    Tyri Elmore, LPC 12/06/2015

## 2015-12-29 ENCOUNTER — Ambulatory Visit (HOSPITAL_COMMUNITY): Payer: Self-pay | Admitting: Psychology

## 2016-01-03 ENCOUNTER — Telehealth (HOSPITAL_COMMUNITY): Payer: Self-pay

## 2016-01-10 ENCOUNTER — Ambulatory Visit (HOSPITAL_COMMUNITY): Payer: Self-pay | Admitting: Psychology

## 2016-01-24 ENCOUNTER — Ambulatory Visit (HOSPITAL_COMMUNITY): Payer: Self-pay | Admitting: Psychology

## 2016-01-25 ENCOUNTER — Ambulatory Visit (HOSPITAL_COMMUNITY): Payer: Self-pay | Admitting: Psychology

## 2016-01-30 ENCOUNTER — Encounter (HOSPITAL_COMMUNITY): Payer: Self-pay | Admitting: Psychology

## 2016-01-30 NOTE — Progress Notes (Signed)
Lisa [name] is a 25 y.o. female patient who is discharged from counseling services.  Pt called 01/03/16 cancelling f/u counseling appointments informing that she has found another therapist.    Outpatient Therapist Discharge Summary  Kayti Poss    [birth year]   Admission Date: 11/01/16   Discharge Date:  01/30/16 Reason for Discharge:  Pt found another provider in the community Diagnosis: Bipolar D/O  Comments:  Pt was only seen for 2 appointments.  Pt was seeing a psychiatrist outside of this practice as well.    Alfredo Batty, LPC

## 2016-11-01 ENCOUNTER — Other Ambulatory Visit (HOSPITAL_COMMUNITY)
Admission: RE | Admit: 2016-11-01 | Discharge: 2016-11-01 | Disposition: A | Discharge status: Home / Self Care | Payer: BLUE CROSS/BLUE SHIELD | Source: Ambulatory Visit | Attending: Nurse Practitioner | Admitting: Nurse Practitioner

## 2016-11-01 ENCOUNTER — Other Ambulatory Visit: Payer: Self-pay | Admitting: Nurse Practitioner

## 2016-11-01 DIAGNOSIS — E049 Nontoxic goiter, unspecified: Secondary | ICD-10-CM

## 2016-11-01 DIAGNOSIS — Z01419 Encounter for gynecological examination (general) (routine) without abnormal findings: Secondary | ICD-10-CM | POA: Diagnosis present

## 2016-11-01 DIAGNOSIS — Z113 Encounter for screening for infections with a predominantly sexual mode of transmission: Secondary | ICD-10-CM | POA: Insufficient documentation

## 2016-11-02 ENCOUNTER — Ambulatory Visit
Admission: RE | Admit: 2016-11-02 | Discharge: 2016-11-02 | Disposition: A | Discharge status: Home / Self Care | Payer: BLUE CROSS/BLUE SHIELD | Source: Ambulatory Visit | Attending: Nurse Practitioner | Admitting: Nurse Practitioner

## 2016-11-02 DIAGNOSIS — E049 Nontoxic goiter, unspecified: Secondary | ICD-10-CM

## 2016-11-02 LAB — CYTOLOGY - PAP
Chlamydia: NEGATIVE
Diagnosis: NEGATIVE
Neisseria Gonorrhea: NEGATIVE
Trichomonas: NEGATIVE

## 2018-01-24 ENCOUNTER — Other Ambulatory Visit: Payer: Self-pay | Admitting: Nurse Practitioner

## 2018-01-24 DIAGNOSIS — E049 Nontoxic goiter, unspecified: Secondary | ICD-10-CM

## 2018-01-28 ENCOUNTER — Other Ambulatory Visit: Payer: Self-pay

## 2018-01-29 ENCOUNTER — Ambulatory Visit
Admission: RE | Admit: 2018-01-29 | Discharge: 2018-01-29 | Disposition: A | Discharge status: Home / Self Care | Payer: BLUE CROSS/BLUE SHIELD | Source: Ambulatory Visit | Attending: Nurse Practitioner | Admitting: Nurse Practitioner

## 2018-01-29 DIAGNOSIS — E049 Nontoxic goiter, unspecified: Secondary | ICD-10-CM

## 2018-02-26 ENCOUNTER — Other Ambulatory Visit: Payer: Self-pay

## 2018-11-05 NOTE — Progress Notes (Unsigned)
See note

## 2019-02-12 ENCOUNTER — Other Ambulatory Visit: Payer: Self-pay | Admitting: Nurse Practitioner

## 2019-02-12 DIAGNOSIS — R946 Abnormal results of thyroid function studies: Secondary | ICD-10-CM

## 2019-08-20 ENCOUNTER — Encounter: Payer: Self-pay | Admitting: Neurology

## 2019-08-20 ENCOUNTER — Ambulatory Visit (INDEPENDENT_AMBULATORY_CARE_PROVIDER_SITE_OTHER): Payer: Commercial Managed Care - PPO | Admitting: Neurology

## 2019-08-20 ENCOUNTER — Other Ambulatory Visit: Payer: Self-pay

## 2019-08-20 VITALS — BP 155/93 | HR 101 | Temp 109.0°F | Ht 66.0 in | Wt 167.6 lb

## 2019-08-20 DIAGNOSIS — R202 Paresthesia of skin: Secondary | ICD-10-CM

## 2019-08-20 NOTE — Progress Notes (Addendum)
Reason for visit: Substernal chest pain, paresthesias  Referring physician: Dr. Mickel DuhamelEksir  [name] is a 28 y.o. female  History of present illness:  [name] is a 28 year old left-handed white female with a history of a drug-induced psychosis at age 28, the patient apparently was diagnosed with bipolar disorder and had been treated with multiple medications including Latuda, lamotrigine, and Seroquel in the past.  Recently, she was taken off of JordanLatuda, this was discontinued in July 2020.  The patient takes Ativan if needed for anxiety which has been a problem for her.  She has been off of Seroquel and lamotrigine for several years.  The patient comes in today with symptoms that began about 3 weeks ago.  She began noting some discomfort in the upper abdomen and lower chest area in the midline that initially felt like muscle tension.  As time has gone on, this has evolved into an achy pain that sometimes will transmit into the left chest area below the breast.  The patient then began having a multitude of sensory symptoms including a tight sensation of the left forearm, some occasional electric shock sensations in the heart area and some transient speech problems.  The patient has had an occasional sharp pain in the right leg.  She has had sweats at nighttime that occurred within the last day or so, she notes that the discomfort in the substernal area will worsen when she is lying down flat, she indicates that she has had reflux symptoms in the past.  She was seen by orthopedic surgery, the possibility of costochondritis was entertained.  She does have some discomfort at times with breathing.  She does note some occasional left-sided neck pain.  Most if not all of her symptoms are intermittent in nature, and are not persistent.  The patient is having a significant reaction with anxiety to these above symptoms.  She reports no definite weakness of extremities, she has not had any significant change in  balance, she occasionally will have problems with voiding the bladder.  She denies any visual field changes such as double vision or loss of vision.  She is sent to this office for an evaluation.  Past Medical History:  Diagnosis Date   Anxiety     Past Surgical History:  Procedure Laterality Date   extraction of wisdom teeth      Family History  Problem Relation Age of Onset   Schizophrenia Mother    Depression Father    Seizures Sister     Social history:  reports that she quit smoking about 3 years ago. Her smoking use included cigarettes. She has a 0.13 pack-year smoking history. She has never used smokeless tobacco. She reports current alcohol use. She reports that she does not use drugs.  Medications:  Prior to Admission medications   Medication Sig Start Date End Date Taking? Authorizing Provider  B Complex Vitamins (VITAMIN-B COMPLEX) TABS Take 1 tablet by mouth every other day. 09/09/15  Yes Adonis BrookAgustin, Sheila, NP  ferrous sulfate 325 (65 FE) MG tablet Take 1 tablet (325 mg total) by mouth See admin instructions. One tablet weekly as needed for weakness 09/09/15  Yes Adonis BrookAgustin, Sheila, NP  lamoTRIgine (LAMICTAL) 25 MG tablet Take two tablets in morning and one tablet at 5pm. Patient taking differently: 100 mg. Take two tablets in morning and one tablet at 5pm. 10/05/15  Yes Cobos, Rockey SituFernando A, MD  LATUDA 20 MG TABS tablet TAKE 1 TABLET BY MOUTH NIGHTLY FOR 2  WEEKS THEN REDUCE TO 1/2 TABLET NIGHTLY FOR 2 WEEKS THEN STOP 05/15/19  Yes [provider]  LORazepam (ATIVAN) 0.5 MG tablet Take 0.5 mg by mouth as needed for anxiety.   Yes [provider]  QUEtiapine (SEROQUEL) 200 MG tablet Take 1 tablet (200 mg total) by mouth at bedtime. 10/05/15  Yes Cobos, Myer Peer, MD  vitamin C (ASCORBIC ACID) 500 MG tablet Take 1 tablet (500 mg total) by mouth daily. 09/09/15  Yes Kerrie Buffalo, NP      Allergies  Allergen Reactions   Artane [Trihexyphenidyl] Anaphylaxis     Invega [Paliperidone Er] Other (See Comments)    eps and prolactin increase    Ambien [Zolpidem] Other (See Comments)    Sleep walking   Other Other (See Comments)    Nuts - food poisoning effect, makes sick on stomach     ROS:  Out of a complete 14 system review of symptoms, the patient complains only of the following symptoms, and all other reviewed systems are negative.  Chest pain Tightness sensation, paresthesias Difficulty voiding bladder  Blood pressure (!) 155/93, pulse (!) 101, temperature (!) 109 F (42.8 C), height 5\' 6"  (1.676 m), weight 167 lb 9.6 oz (76 kg).  Physical Exam  General: The patient is alert and cooperative at the time of the examination.  Eyes: Pupils are equal, round, and reactive to light. Discs are flat bilaterally.  Neck: The neck is supple, no carotid bruits are noted.  Respiratory: The respiratory examination is clear.  Cardiovascular: The cardiovascular examination reveals a regular rate and rhythm, no obvious murmurs or rubs are noted.  Neuromuscular: Range of movement of the cervical spine is full.  Skin: Extremities are without significant edema.  Neurologic Exam  Mental status: The patient is alert and oriented x 3 at the time of the examination. The patient has apparent normal recent and remote memory, with an apparently normal attention span and concentration ability.  Cranial nerves: Facial symmetry is present. There is good sensation of the face to pinprick and soft touch bilaterally. The strength of the facial muscles and the muscles to head turning and shoulder shrug are normal bilaterally. Speech is well enunciated, no aphasia or dysarthria is noted. Extraocular movements are full. Visual fields are full. The tongue is midline, and the patient has symmetric elevation of the soft palate. No obvious hearing deficits are noted.  Motor: The motor testing reveals 5 over 5 strength of all 4 extremities. Good symmetric motor tone is  noted throughout.  Sensory: Sensory testing is intact to pinprick, soft touch, vibration sensation, and position sense on all 4 extremities. No evidence of extinction is noted.  Coordination: Cerebellar testing reveals good finger-nose-finger and heel-to-shin bilaterally.  Gait and station: Gait is normal. Tandem gait is normal. Romberg is negative. No drift is seen.  Reflexes: Deep tendon reflexes are symmetric and normal bilaterally. Toes are downgoing bilaterally.   Assessment/Plan:  1.  Substernal chest pain  2.  Paresthesias, type sensations, intermittent  3.  Anxiety disorder, panic disorder  The patient appears to have some symptoms consistent with gastroesophageal reflux disease, she will go on Prilosec for several weeks to see if this improves her symptoms.  The patient may be having a significant anxiety reaction to her underlying symptoms, she is having a multitude of body symptoms that are intermittent in nature and apparently unrelated.  However, further work-up is indicated, we need to exclude an underlying demyelinating disease.  Blood work will be  done today, she will have MRI of the brain and cervical spine.  If the studies are unremarkable, adequate treatment of anxiety will need to be undertaken.  Dr. Angus Palms is not in the epic system, his telephone number is [phone].  His fax number is [phone].  Marlan Palau MD 08/28/2019 8:04 AM  [facility] [address] [place], [place] [address]  Phone [phone] Fax [phone]

## 2019-08-21 LAB — SEDIMENTATION RATE: Sed Rate: 2 mm/hr (ref 0–32)

## 2019-08-21 LAB — COMPREHENSIVE METABOLIC PANEL
ALT: 14 IU/L (ref 0–32)
AST: 18 IU/L (ref 0–40)
Albumin/Globulin Ratio: 2.2 (ref 1.2–2.2)
Albumin: 5.1 g/dL — ABNORMAL HIGH (ref 3.9–5.0)
Alkaline Phosphatase: 48 IU/L (ref 39–117)
BUN/Creatinine Ratio: 9 (ref 9–23)
BUN: 9 mg/dL (ref 6–20)
Bilirubin Total: 0.5 mg/dL (ref 0.0–1.2)
CO2: 22 mmol/L (ref 20–29)
Calcium: 9.6 mg/dL (ref 8.7–10.2)
Chloride: 103 mmol/L (ref 96–106)
Creatinine, Ser: 0.95 mg/dL (ref 0.57–1.00)
GFR calc Af Amer: 96 mL/min/{1.73_m2} (ref >59)
GFR calc non Af Amer: 83 mL/min/{1.73_m2} (ref >59)
Globulin, Total: 2.3 g/dL (ref 1.5–4.5)
Glucose: 84 mg/dL (ref 65–99)
Potassium: 4.5 mmol/L (ref 3.5–5.2)
Sodium: 139 mmol/L (ref 134–144)
Total Protein: 7.4 g/dL (ref 6.0–8.5)

## 2019-08-21 LAB — HIV ANTIBODY (ROUTINE TESTING W REFLEX): HIV Screen 4th Generation wRfx: NONREACTIVE

## 2019-08-21 LAB — VITAMIN B12: Vitamin B-12: 269 pg/mL (ref 232–1245)

## 2019-08-21 LAB — B. BURGDORFI ANTIBODIES: Lyme IgG/IgM Ab: <0.91 {ISR} (ref 0.00–0.90)

## 2019-08-21 LAB — ANA W/REFLEX: Anti Nuclear Antibody (ANA): NEGATIVE

## 2019-08-21 LAB — ANGIOTENSIN CONVERTING ENZYME: Angio Convert Enzyme: 26 U/L (ref 14–82)

## 2019-08-24 ENCOUNTER — Telehealth: Payer: Self-pay

## 2019-08-24 ENCOUNTER — Telehealth: Payer: Self-pay | Admitting: Neurology

## 2019-08-24 NOTE — Telephone Encounter (Signed)
no to the covid questions MR Brain w/wo contrast & MR Cervical spine w/wo contrast Dr. Alphonzo Severance Josem Kaufmann: [id number] (exp. 08/24/19 to 09/22/19). Patient is scheduled at [facility] for 08/25/19.

## 2019-08-24 NOTE — Telephone Encounter (Signed)
I contacted the pt and she and I dicussed the possibility of switching providers. I advised the pt both MD's wanted to wait and see what the MRI report showed before scheduling a f.u at our office because one may not be needed.  Pt was ok with waiting for MRI results. Pt did report over the weekend; chest pain increased and she felt like her "chest was collapsing" along with "something breaking inside her chest". Pt would treat symptoms with heating pad/ice and would help some along with ibuprofen. She also reported on 08/21/2019 she woke up at 4 am and had left arm numbness went back to sleep for a few hours and then had severe chest pain.

## 2019-08-24 NOTE — Telephone Encounter (Signed)
I agree with Dr. Jannifer Franklin, thanks

## 2019-08-24 NOTE — Telephone Encounter (Signed)
-----   Message from Kathrynn Ducking, MD sent at 08/20/2019  6:39 PM EDT ----- Please copy the NP note and fax to Dr. Daron Offer. Thank you

## 2019-08-24 NOTE — Telephone Encounter (Signed)
Pt called stating that she is wanting to know what is the update on scheduling her MRI. Also she is wanting to switch provider's to Dr. Jaynee Eagles due to not agreeing with a recommendation that she was given by current provider. Please advise.

## 2019-08-24 NOTE — Telephone Encounter (Signed)
I would hold off on until the work-up is completed, I am not sure that a revisit is even needed.  If the MRI studies are completely normal, the patient does not need to be followed here, I believe the main issue is anxiety.

## 2019-08-25 ENCOUNTER — Ambulatory Visit: Payer: Commercial Managed Care - PPO

## 2019-08-25 ENCOUNTER — Other Ambulatory Visit: Payer: Self-pay

## 2019-08-25 DIAGNOSIS — R202 Paresthesia of skin: Secondary | ICD-10-CM | POA: Diagnosis not present

## 2019-08-25 MED ORDER — GADOBENATE DIMEGLUMINE 529 MG/ML IV SOLN
15.0000 mL | Freq: Once | INTRAVENOUS | Status: AC | PRN
  Administered 2019-08-25: 15 mL via INTRAVENOUS

## 2019-08-28 ENCOUNTER — Telehealth: Payer: Self-pay | Admitting: Neurology

## 2019-08-28 NOTE — Telephone Encounter (Signed)
  I called the patient.  MRI of the brain and cervical spine is completely normal.  Blood work is normal, no evidence of any sinister etiology of her sensory symptoms, suspect the patient has a high degree of anxiety leading to somatic symptoms.    MRI brain 08/27/19:  IMPRESSION:   Normal MRI brain (with and without).    MRI cervical 08/27/19:   IMPRESSION:   Normal MRI cervical spine (with and without).

## 2019-08-31 ENCOUNTER — Encounter (HOSPITAL_COMMUNITY): Payer: Self-pay | Admitting: Emergency Medicine

## 2019-08-31 ENCOUNTER — Emergency Department (HOSPITAL_COMMUNITY)
Admission: EM | Admit: 2019-08-31 | Discharge: 2019-08-31 | Disposition: A | Discharge status: Other Acute Inpatient Hospital | Payer: Commercial Managed Care - PPO | Source: Home / Self Care | Attending: Emergency Medicine | Admitting: Emergency Medicine

## 2019-08-31 ENCOUNTER — Inpatient Hospital Stay (HOSPITAL_COMMUNITY)
Admission: AD | Admit: 2019-08-31 | Discharge: 2019-09-08 | DRG: 885 | Disposition: A | Discharge status: Home / Self Care | Payer: Commercial Managed Care - PPO | Attending: Psychiatry | Admitting: Psychiatry

## 2019-08-31 ENCOUNTER — Other Ambulatory Visit: Payer: Self-pay

## 2019-08-31 ENCOUNTER — Encounter (HOSPITAL_COMMUNITY): Payer: Self-pay

## 2019-08-31 DIAGNOSIS — Z818 Family history of other mental and behavioral disorders: Secondary | ICD-10-CM | POA: Diagnosis not present

## 2019-08-31 DIAGNOSIS — F250 Schizoaffective disorder, bipolar type: Principal | ICD-10-CM | POA: Diagnosis present

## 2019-08-31 DIAGNOSIS — I10 Essential (primary) hypertension: Secondary | ICD-10-CM | POA: Diagnosis present

## 2019-08-31 DIAGNOSIS — F309 Manic episode, unspecified: Secondary | ICD-10-CM

## 2019-08-31 DIAGNOSIS — Z9119 Patient's noncompliance with other medical treatment and regimen: Secondary | ICD-10-CM | POA: Diagnosis not present

## 2019-08-31 DIAGNOSIS — G4700 Insomnia, unspecified: Secondary | ICD-10-CM | POA: Diagnosis present

## 2019-08-31 DIAGNOSIS — F600 Paranoid personality disorder: Secondary | ICD-10-CM | POA: Diagnosis not present

## 2019-08-31 DIAGNOSIS — F419 Anxiety disorder, unspecified: Secondary | ICD-10-CM | POA: Diagnosis present

## 2019-08-31 DIAGNOSIS — F312 Bipolar disorder, current episode manic severe with psychotic features: Secondary | ICD-10-CM | POA: Insufficient documentation

## 2019-08-31 DIAGNOSIS — Z87891 Personal history of nicotine dependence: Secondary | ICD-10-CM | POA: Insufficient documentation

## 2019-08-31 DIAGNOSIS — Z20828 Contact with and (suspected) exposure to other viral communicable diseases: Secondary | ICD-10-CM | POA: Diagnosis present

## 2019-08-31 LAB — CBC
HCT: 47.8 % — ABNORMAL HIGH (ref 36.0–46.0)
Hemoglobin: 16.4 g/dL — ABNORMAL HIGH (ref 12.0–15.0)
MCH: 31.3 pg (ref 26.0–34.0)
MCHC: 34.3 g/dL (ref 30.0–36.0)
MCV: 91.2 fL (ref 80.0–100.0)
Platelets: 305 10*3/uL (ref 150–400)
RBC: 5.24 MIL/uL — ABNORMAL HIGH (ref 3.87–5.11)
RDW: 12.1 % (ref 11.5–15.5)
WBC: 12.4 10*3/uL — ABNORMAL HIGH (ref 4.0–10.5)
nRBC: 0 % (ref 0.0–0.2)

## 2019-08-31 LAB — I-STAT BETA HCG BLOOD, ED (MC, WL, AP ONLY): I-stat hCG, quantitative: <5 m[IU]/mL (ref <5)

## 2019-08-31 LAB — ETHANOL: Alcohol, Ethyl (B): <10 mg/dL (ref <10)

## 2019-08-31 LAB — COMPREHENSIVE METABOLIC PANEL
ALT: 33 U/L (ref 0–44)
AST: 37 U/L (ref 15–41)
Albumin: 4.9 g/dL (ref 3.5–5.0)
Alkaline Phosphatase: 45 U/L (ref 38–126)
Anion gap: 16 — ABNORMAL HIGH (ref 5–15)
BUN: 13 mg/dL (ref 6–20)
CO2: 18 mmol/L — ABNORMAL LOW (ref 22–32)
Calcium: 9.4 mg/dL (ref 8.9–10.3)
Chloride: 104 mmol/L (ref 98–111)
Creatinine, Ser: 0.97 mg/dL (ref 0.44–1.00)
GFR calc Af Amer: >60 mL/min (ref >60)
GFR calc non Af Amer: >60 mL/min (ref >60)
Glucose, Bld: 125 mg/dL — ABNORMAL HIGH (ref 70–99)
Potassium: 3.4 mmol/L — ABNORMAL LOW (ref 3.5–5.1)
Sodium: 138 mmol/L (ref 135–145)
Total Bilirubin: 1 mg/dL (ref 0.3–1.2)
Total Protein: 7.7 g/dL (ref 6.5–8.1)

## 2019-08-31 LAB — CK: Total CK: 387 U/L — ABNORMAL HIGH (ref 38–234)

## 2019-08-31 LAB — SALICYLATE LEVEL: Salicylate Lvl: <7 mg/dL (ref 2.8–30.0)

## 2019-08-31 LAB — SARS CORONAVIRUS 2 BY RT PCR (HOSPITAL ORDER, PERFORMED IN ~~LOC~~ HOSPITAL LAB): SARS Coronavirus 2: NEGATIVE

## 2019-08-31 LAB — ACETAMINOPHEN LEVEL: Acetaminophen (Tylenol), Serum: <10 ug/mL — ABNORMAL LOW (ref 10–30)

## 2019-08-31 LAB — RAPID URINE DRUG SCREEN, HOSP PERFORMED
Amphetamines: NOT DETECTED
Barbiturates: NOT DETECTED
Benzodiazepines: NOT DETECTED
Cocaine: NOT DETECTED
Opiates: NOT DETECTED
Tetrahydrocannabinol: NOT DETECTED

## 2019-08-31 MED ORDER — SODIUM CHLORIDE 0.9 % IV BOLUS
1000.0000 mL | Freq: Once | INTRAVENOUS | Status: AC
  Administered 2019-08-31: 1000 mL via INTRAVENOUS

## 2019-08-31 MED ORDER — ZIPRASIDONE MESYLATE 20 MG IM SOLR
20.0000 mg | Freq: Once | INTRAMUSCULAR | Status: AC
  Administered 2019-08-31: 20 mg via INTRAMUSCULAR
  Filled 2019-08-31: qty 20

## 2019-08-31 MED ORDER — LORAZEPAM 2 MG/ML IJ SOLN
2.0000 mg | Freq: Once | INTRAMUSCULAR | Status: AC
  Administered 2019-08-31: 2 mg via INTRAMUSCULAR
  Filled 2019-08-31: qty 1

## 2019-08-31 MED ORDER — STERILE WATER FOR INJECTION IJ SOLN
INTRAMUSCULAR | Status: AC
  Filled 2019-08-31: qty 10

## 2019-08-31 MED ORDER — LORAZEPAM 0.5 MG PO TABS
0.5000 mg | ORAL_TABLET | Freq: Three times a day (TID) | ORAL | Status: DC | PRN
  Administered 2019-09-01 – 2019-09-06 (×4): 0.5 mg via ORAL
  Filled 2019-08-31 (×4): qty 1

## 2019-08-31 MED ORDER — OLANZAPINE 10 MG IM SOLR
10.0000 mg | Freq: Once | INTRAMUSCULAR | Status: AC
  Administered 2019-08-31: 19:00:00 10 mg via INTRAMUSCULAR
  Filled 2019-08-31 (×2): qty 10

## 2019-08-31 MED ORDER — DIPHENHYDRAMINE HCL 50 MG/ML IJ SOLN
25.0000 mg | Freq: Once | INTRAMUSCULAR | Status: AC
  Administered 2019-08-31: 19:00:00 25 mg via INTRAVENOUS
  Filled 2019-08-31: qty 1
  Filled 2019-08-31: qty 0.5

## 2019-08-31 MED ORDER — LEVONORGEST-ETH ESTRAD 91-DAY 0.15-0.03 MG PO TABS: 1.0000 | ORAL_TABLET | Freq: Every day | ORAL | Status: DC

## 2019-08-31 MED ORDER — OLANZAPINE 5 MG PO TBDP
5.0000 mg | ORAL_TABLET | Freq: Every day | ORAL | Status: DC
  Filled 2019-08-31: qty 1

## 2019-08-31 NOTE — Progress Notes (Signed)
Pt in the dayroom being disruptive. Pt continuing to try to remove the chair from the dayroom. Pt continues to stand in the hallway making noise. Pt is asked to bring her voice down due to other patients sleeping.

## 2019-08-31 NOTE — ED Notes (Signed)
Pt discharged safely with [employer].  All belongings were sent with patient. 

## 2019-08-31 NOTE — Progress Notes (Signed)
Pt continues to walk up to the nursing station trying to do and say things to bother staff. Pt came to the nursing station opening a juice and spilling it on the nursing station.

## 2019-08-31 NOTE — Progress Notes (Signed)
Pt comes out dancing at nursing station, pt continues to be defiant , needing constant redirection.

## 2019-08-31 NOTE — Progress Notes (Signed)
Pt comes up to the board trying to wipe names off, pt goes down to end of the hall and sits on the floor. Pt sits on the floor then crawls around trying to get staff to notice her and say something to her, pt safe at this time

## 2019-08-31 NOTE — Progress Notes (Signed)
Pt continues to pace the hall making noise, pt has to require constant redirection to maintain compliance.

## 2019-08-31 NOTE — ED Provider Notes (Signed)
Patient in the process of being formally assessed by behavioral health.  But most likely will require admission.  Patient became more agitated.  Last had Geodon at 3 in the morning.  Will re-dose with the Geodon.  Helps her temporarily.  Pending formal behavioral health recommendations.   Fredia Sorrow, MD 08/31/19 1011

## 2019-08-31 NOTE — ED Notes (Addendum)
This patient is extremely labile.  She is crying one minute and cursing staff the next.  She has been standing at nurses station demanding [place] to define words and calling us "Stupid"  She is not redirectable.  She is disturbing other patients and asking them to hug her.

## 2019-08-31 NOTE — Progress Notes (Signed)
Pt continues up touching the board, pt continues doing things being disruptive on the unit. Pt around on the unit continuing to be disruptive.

## 2019-08-31 NOTE — ED Notes (Addendum)
Making rounds on pt and patient playing with TTS machine. Pt. Wondering around in the room and would not get into bed for her safety. Pt. Standing in room at the door. Advised pt. she needs to get back in the bed that she couldn't be wondering around in the room. Security at bedside. Pt. Resting in her bed at this time.

## 2019-08-31 NOTE — Progress Notes (Signed)
Pt up to the nursing station singing, pt stated she can not do that she was waking up other patients.
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
## 2019-08-31 NOTE — Progress Notes (Signed)
Pt up at the nursing station banging on the nursing desl " Fuck me please fuck me, I love it when men fuck me " . Pt asked to go to her room. Pt continues to be defiant towards staff.

## 2019-08-31 NOTE — ED Notes (Signed)
Pt. In burgundy scrubs and wanded by security. Pt. Has 1 black sports bra, 1 black pant, 1 pr. Socks, 1 yellow cell phone, no wallet and no money. Pt. Belongings locked up in [place] in 1-8 zone.

## 2019-08-31 NOTE — Progress Notes (Signed)
Pt continues to present with intentional disruptive behaviors. Pt has oppositional tendicies

## 2019-08-31 NOTE — Progress Notes (Signed)
Pt was disruptive on the milieu, yelling, screaming and intrusive towards other patients. Pt was trying to go into other patients rooms. Pt was given IMs per MAR. At the beginning of the shift.

## 2019-08-31 NOTE — Progress Notes (Signed)
Pt given 2 mg Ativan IM , accepted without resistance,per MAR due to pt being disruptive on the unit trying to wake up other patients and refusing to be redirectable at times.

## 2019-08-31 NOTE — Progress Notes (Signed)
Pt came out the room asking for snacks , pt was informed that she wasted her snacks and juice she had and was informed that she could get something for breakfast. Pt is very argumentative and demanding.
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
## 2019-08-31 NOTE — Progress Notes (Signed)
Pt continues to come to the nursing station making noise trying to wake up other patients, pt has to continuously be redirected. Other patients can be seen looking out their room at where the noise is coming from

## 2019-08-31 NOTE — Discharge Summary (Addendum)
  Patient to be transferred to [place] for inpatient psychiatric treatment  Patient's chart reviewed. Reviewed the information documented and agree with the treatment plan.  Buford Dresser, DO 08/31/19 6:44 PM

## 2019-08-31 NOTE — Progress Notes (Signed)
Pt was in the dayroom trying to close the door, pt was being disruptive , very intrusive in other patients personal space needing constant re direction. Pt was shouting in the dayroom. Pt was asked to go to her room. Pt began yelling and singing, pt was informed that she could sing and dance in her room , but she could not be disruptive to other patients trying to sleep.

## 2019-08-31 NOTE — Progress Notes (Signed)
Pt up in the hall making noise and when asked to quiet down pt continues to be argumentative needing redirection. Pt continues to come up talking and not following directions .

## 2019-08-31 NOTE — Progress Notes (Signed)
Pt smeared pudding on the bench by the phone, pt sat in the pudding, pt sat on the floor and smeared pudding on the floor

## 2019-08-31 NOTE — Progress Notes (Signed)
Adult Psychoeducational Group Note  Date:  08/31/2019 Time:  11:01 PM  Group Topic/Focus:  Wrap-Up Group:   The focus of this group is to help patients review their daily goal of treatment and discuss progress on daily workbooks.  Participation Level:  Did Not Attend  Participation Quality:  Did Not Attend  Affect:  Did Not Attend  Cognitive:  Did Not Attend  Insight: None  Engagement in Group:  Did Not Attend  Modes of Intervention:  Did Not Attend  Additional Comments:  Pt did not attend evening wrap up group tonight.  Candy Sledge 08/31/2019, 11:01 PM

## 2019-08-31 NOTE — BH Assessment (Signed)
Pt's father's contact information:  Jaedan Schuman, father: [phone] (home)

## 2019-08-31 NOTE — BH Assessment (Signed)
[facility] Assessment Progress Note  Per Buford Dresser, DO, this pt requires psychiatric hospitalization.  Heather, RN has assigned pt to [facility] Rm 502-2.  Pt presents under IVC initiated by pt's father, and upheld by Dr Mariea Clonts, and IVC documents have been faxed to [facility].  Pt's nurse, Nena Jordan, has been notified, and agrees to call report to [phone].  Pt is to be transported via [facility].   Jalene Mullet, [place] Coordinator [phone]

## 2019-08-31 NOTE — ED Notes (Signed)
Pt is continuously making herself throw up throughout the unit.

## 2019-09-01 DIAGNOSIS — F250 Schizoaffective disorder, bipolar type: Principal | ICD-10-CM

## 2019-09-01 MED ORDER — ZIPRASIDONE MESYLATE 20 MG IM SOLR
20.0000 mg | Freq: Once | INTRAMUSCULAR | Status: DC
  Filled 2019-09-01: qty 20

## 2019-09-01 MED ORDER — TEMAZEPAM 30 MG PO CAPS
30.0000 mg | ORAL_CAPSULE | Freq: Every day | ORAL | Status: DC
  Administered 2019-09-04 – 2019-09-07 (×2): 30 mg via ORAL
  Filled 2019-09-01 (×3): qty 1

## 2019-09-01 MED ORDER — BENZTROPINE MESYLATE 1 MG PO TABS
1.0000 mg | ORAL_TABLET | Freq: Two times a day (BID) | ORAL | Status: DC
  Administered 2019-09-03 – 2019-09-08 (×11): 1 mg via ORAL
  Filled 2019-09-01 (×21): qty 1

## 2019-09-01 MED ORDER — RISPERIDONE 3 MG PO TABS
3.0000 mg | ORAL_TABLET | Freq: Two times a day (BID) | ORAL | Status: DC
  Administered 2019-09-02 – 2019-09-03 (×2): 3 mg via ORAL
  Filled 2019-09-01 (×7): qty 1

## 2019-09-01 MED ORDER — CARVEDILOL 25 MG PO TABS
25.0000 mg | ORAL_TABLET | Freq: Two times a day (BID) | ORAL | Status: DC
  Administered 2019-09-03 – 2019-09-08 (×9): 25 mg via ORAL
  Filled 2019-09-01 (×9): qty 1
  Filled 2019-09-01: qty 2
  Filled 2019-09-01 (×10): qty 1

## 2019-09-01 MED ORDER — OMEGA-3-ACID ETHYL ESTERS 1 G PO CAPS
1.0000 g | ORAL_CAPSULE | Freq: Two times a day (BID) | ORAL | Status: DC
  Administered 2019-09-03 – 2019-09-08 (×11): 1 g via ORAL
  Filled 2019-09-01 (×21): qty 1

## 2019-09-01 NOTE — Progress Notes (Signed)
Pt up to the nursing station yelling at staff" I can fucking yell I'm a woman". Pt informed she was the only one yelling and disrespecting staff" I can fucking disrespect who I want I'm a woman" . Pt continues to be verbally aggressive and intrusive. When other patients are trying to talk to staff, pt up listening and pt becomes loud and says something disrespectful when asked to give writer and another pt privacy.
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
## 2019-09-01 NOTE — Progress Notes (Signed)
D: Pt stated " I don't take meds" pt up to the nursing station argumentative toward writer coming up asking questions then yelling at writer " you are threatening me " " can I message the doctor, can I have alka seltzer plus, can I have vitamins" pt was referred to ask the doctor about her demands.   A: Pt was offered support and encouragement. Pt was encourage to attend groups. Q 15 minute checks were done for safety.  R:  safety maintained on unit.

## 2019-09-01 NOTE — B[facility] Suicide Risk Assessment (Signed)
[facility] Admission Suicide Risk Assessment   Nursing information obtained from:  Patient Demographic factors:  Caucasian Current Mental Status:  NA Loss Factors:  NA Historical Factors:  Impulsivity Risk Reduction Factors:  NA  Total Time spent with patient: 45 minutes Principal Problem: Manic with psychosis Diagnosis:  Active Problems:   Schizoaffective disorder, bipolar type ([place])  Subjective Data: poor cooperation thus far Continued Clinical Symptoms:  Alcohol Use Disorder Identification Test Final Score (AUDIT): 0 The "Alcohol Use Disorders Identification Test", Guidelines for Use in Primary Care, Second Edition.  World Pharmacologist [facility]). Score between 0-7:  no or low risk or alcohol related problems. Score between 8-15:  moderate risk of alcohol related problems. Score between 16-19:  high risk of alcohol related problems. Score 20 or above:  warrants further diagnostic evaluation for alcohol dependence and treatment.   CLINICAL FACTORS: Schizoaffective/bipolar type Musculoskeletal: Strength & Muscle Tone: within normal limits Gait & Station: normal Patient leans: N/A  Psychiatric Specialty Exam: Physical Exam  ROS  There were no vitals taken for this visit.There is no height or weight on file to calculate BMI.  General Appearance: Casual  Eye Contact:  Fair  Speech:  Pressured  Volume:  Increased  Mood:  Angry, Irritable and Manic  Affect:  Congruent and Labile  Thought Process:  Disorganized, Irrelevant and Descriptions of Associations: Loose  Orientation:  Other:  Person place situation  Thought Content:  Illogical, Delusions and And states she has no recall of why she is in the hospital  Suicidal Thoughts:  No  Homicidal Thoughts:  No  Memory:  Immediate;   Poor Recent;   Poor Remote;   Good  Judgement:  Impaired  Insight:  Lacking  Psychomotor Activity:  Increased  Concentration:  Concentration: Poor and Attention Span: Poor  Recall:  Poor  Fund of  Knowledge:  Fair  Language:  Fair  Akathisia:  Negative  Handed:  Right  AIMS (if indicated):     Assets:  Agricultural consultant Housing Leisure Time Physical Health Resilience Social Support  ADL's:  Intact  Cognition:  WNL  Sleep:  Number of Hours: 1     COGNITIVE FEATURES THAT CONTRIBUTE TO RISK:  Closed-mindedness and Loss of executive function    SUICIDE RISK:   Minimal: No identifiable suicidal ideation.  Patients presenting with no risk factors but with morbid ruminations; may be classified as minimal risk based on the severity of the depressive symptoms  PLAN OF CARE: Admit for stabilization  I certify that inpatient services furnished can reasonably be expected to improve the patient's condition.   [name], MD 09/01/2019, 8:31 AM

## 2019-09-01 NOTE — Progress Notes (Signed)
Pt up this morning irritable, talking down to staff. " I don't take meds" "ya'll Fucking idiots , Ya'll don't fucking know what ya'll doing"

## 2019-09-01 NOTE — H&P (Addendum)
Psychiatric Admission Assessment Adult  Patient Identification: Lisa AbedCorina J Begley MRN:  [id number] Date of Evaluation:  09/01/2019 Chief Complaint:  SCHIZOAFFECTIVE DISORDER; BIPOLAR TYPE Principal Diagnosis: Exacerbation of underlying bipolar/schizoaffective type condition Diagnosis:  Active Problems:   Schizoaffective disorder, bipolar type (HCC)  History of Present Illness:   This is at least the fifth psychiatric admission, the fourth here for this 28 year old patient who presented under petition for involuntary commitment.  She is cooperative with the interview for only brief periods of time, but then later comes back to make other demands but again she is clearly manic and unmedicated at this point in time- History of cannabis and K-2 (synthetic cannabis) dep. in past-  Again the patient has a history of initially being diagnosed with a bipolar type condition, versus schizoaffective/bipolar type in the past medications have included Abilify (oral), risperidone, Zyprexa, Invega, Trileptal, Seroquel, lithium, lamotrigine, lurasidone, gabapentin, sertraline, fluoxetine, Ambien and trazodone so she has tried numerous medication combinations.  She is pressured manic has flight of ideas, she is angry irritable and refusing to talk and halfway through the interview breaks off stating "I do not trust you" and states "I am not going to give you my data" refusing to answer further questions she also states she was hospitalized simply because "I was conducting a performance act to demonstrate narcissism and domestic violence" so she quickly makes many nonsensical statements.  Thus she is alert oriented to person place situation refused to answer many questions but manic irritable using profanity and making nonsensical statements that are frankly delusional but ill-defined  Followed by Dr. Rene KocherEksir -   According to information from her father and yesterday's assessment note  "Pt's father shares pt has  been off and on medication in 2014, 2015, and 2016. He states pt has [place], which was a terrible experience for her, and [facility]x [facility]. Pt's father states that they began to get pt's meds straight in 2016 and that, in 2017, she was placed on Latuda and Ativan. Pt's father shares pt talked to her psychiatrist several months ago and requested to be tapered off of her medication, as she felt she was doing well enough to be off of it, and she has been off of her medication since that time. Pt's father states that she was doing well until several weeks ago. Pt's father states pt can never tell when she's doing poorly and that he's had to IVC pt numerous times in the past."  Associated Signs/Symptoms: Depression Symptoms:  psychomotor agitation, (Hypo) Manic Symptoms:  Delusions, Distractibility, Flight of Ideas, Impulsivity, Irritable Mood, Anxiety Symptoms:  n/a Psychotic Symptoms:  Delusions, PTSD Symptoms: NA Total Time spent with patient: 45 minutes  Past Psychiatric History: Multiple admissions multiple medication trials  Is the patient at risk to self? Yes.    Has the patient been a risk to self in the past 6 months? No.  Has the patient been a risk to self within the distant past? Yes.    Is the patient a risk to others? Yes.    Has the patient been a risk to others in the past 6 months? No.  Has the patient been a risk to others within the distant past? No.   Prior Inpatient Therapy:  Intermittent follow-up by report Prior Outpatient Therapy:  4 or 5 admissions  Alcohol Screening: 1. How often do you have a drink containing alcohol?: Never 2. How many drinks containing alcohol do you have on a  typical day when you are drinking?: 1 or 2 3. How often do you have six or more drinks on one occasion?: Never AUDIT-C Score: 0 4. How often during the last year have you found that you were not able to stop drinking once you had started?: Never 5. How often  during the last year have you failed to do what was normally expected from you becasue of drinking?: Never 6. How often during the last year have you needed a first drink in the morning to get yourself going after a heavy drinking session?: Never 7. How often during the last year have you had a feeling of guilt of remorse after drinking?: Never 8. How often during the last year have you been unable to remember what happened the night before because you had been drinking?: Never 9. Have you or someone else been injured as a result of your drinking?: No 10. Has a relative or friend or a doctor or another health worker been concerned about your drinking or suggested you cut down?: No Alcohol Use Disorder Identification Test Final Score (AUDIT): 0 Substance Abuse History in the last 12 months:  No. Consequences of Substance Abuse: NA Previous Psychotropic Medications: yes Psychological Evaluations: No  Past Medical History:  Past Medical History:  Diagnosis Date  . Anxiety     Past Surgical History:  Procedure Laterality Date  . extraction of wisdom teeth     Family History:  Family History  Problem Relation Age of Onset  . Schizophrenia Mother   . Depression Father   . Seizures Sister    Family Psychiatric  History: neg/ukn Tobacco Screening:   Social History:  Social History   Substance and Sexual Activity  Alcohol Use Yes   Comment: casually     Social History   Substance and Sexual Activity  Drug Use No  . Types: Marijuana   Comment: THC last used 2 yrs ago, unsure of amount    Additional Social History:                           Allergies:   Allergies  Allergen Reactions  . Artane [Trihexyphenidyl] Anaphylaxis  . Invega [Paliperidone Er] Other (See Comments)    eps and prolactin increase   . Ambien [Zolpidem] Other (See Comments)    Sleep walking  . Other Other (See Comments)    Nuts - food poisoning effect, makes sick on stomach    Lab Results:   Results for orders placed or performed during the hospital encounter of 08/31/19 (from the past 48 hour(s))  Comprehensive metabolic panel     Status: Abnormal   Collection Time: 08/31/19 12:41 AM  Result Value Ref Range   Sodium 138 135 - 145 mmol/L   Potassium 3.4 (L) 3.5 - 5.1 mmol/L   Chloride 104 98 - 111 mmol/L   CO2 18 (L) 22 - 32 mmol/L   Glucose, Bld 125 (H) 70 - 99 mg/dL   BUN 13 6 - 20 mg/dL   Creatinine, Ser 1.61 0.44 - 1.00 mg/dL   Calcium 9.4 8.9 - 09.6 mg/dL   Total Protein 7.7 6.5 - 8.1 g/dL   Albumin 4.9 3.5 - 5.0 g/dL   AST 37 15 - 41 U/L   ALT 33 0 - 44 U/L   Alkaline Phosphatase 45 38 - 126 U/L   Total Bilirubin 1.0 0.3 - 1.2 mg/dL   GFR calc non Af Amer >60 >60 mL/min  GFR calc Af Amer >60 >60 mL/min   Anion gap 16 (H) 5 - 15    Comment: Performed at [facility], [address] [place], [place] [address]  Ethanol     Status: None   Collection Time: 08/31/19 12:41 AM  Result Value Ref Range   Alcohol, Ethyl (B) <10 <10 mg/dL    Comment: (NOTE) Lowest detectable limit for serum alcohol is 10 mg/dL. For medical purposes only. Performed at [facility], [address] [place], [place] [address]   Salicylate level     Status: None   Collection Time: 08/31/19 12:41 AM  Result Value Ref Range   Salicylate Lvl <7.0 2.8 - 30.0 mg/dL    Comment: Performed at [facility], [address] [place], [place] [address]  Acetaminophen level     Status: Abnormal   Collection Time: 08/31/19 12:41 AM  Result Value Ref Range   Acetaminophen (Tylenol), Serum <10 (L) 10 - 30 ug/mL    Comment: (NOTE) Therapeutic concentrations vary significantly. A range of 10-30 ug/mL  may be an effective concentration for many patients. However, some  are best treated at concentrations outside of this range. Acetaminophen concentrations >150 ug/mL at 4 hours after ingestion  and >50 ug/mL at 12 hours after ingestion are often  associated with  toxic reactions. Performed at [facility], [address] [place], [place] [address]   cbc     Status: Abnormal   Collection Time: 08/31/19 12:41 AM  Result Value Ref Range   WBC 12.4 (H) 4.0 - 10.5 K/uL   RBC 5.24 (H) 3.87 - 5.11 MIL/uL   Hemoglobin 16.4 (H) 12.0 - 15.0 g/dL   HCT 29.5 (H) 62.1 - 30.8 %   MCV 91.2 80.0 - 100.0 fL   MCH 31.3 26.0 - 34.0 pg   MCHC 34.3 30.0 - 36.0 g/dL   RDW 65.7 84.6 - 96.2 %   Platelets 305 150 - 400 K/uL   nRBC 0.0 0.0 - 0.2 %    Comment: Performed at [facility], [address] [place], [place] [address]  Rapid urine drug screen (hospital performed)     Status: None   Collection Time: 08/31/19 12:41 AM  Result Value Ref Range   Opiates NONE DETECTED NONE DETECTED   Cocaine NONE DETECTED NONE DETECTED   Benzodiazepines NONE DETECTED NONE DETECTED   Amphetamines NONE DETECTED NONE DETECTED   Tetrahydrocannabinol NONE DETECTED NONE DETECTED   Barbiturates NONE DETECTED NONE DETECTED    Comment: (NOTE) DRUG SCREEN FOR MEDICAL PURPOSES ONLY.  IF CONFIRMATION IS NEEDED FOR ANY PURPOSE, NOTIFY LAB WITHIN 5 DAYS. LOWEST DETECTABLE LIMITS FOR URINE DRUG SCREEN Drug Class                     Cutoff (ng/mL) Amphetamine and metabolites    1000 Barbiturate and metabolites    200 Benzodiazepine                 200 Tricyclics and metabolites     300 Opiates and metabolites        300 Cocaine and metabolites        300 THC                            50 Performed at [facility], [address], [place], [place] [address]   CK  Status: Abnormal   Collection Time: 08/31/19 12:41 AM  Result Value Ref Range   Total CK 387 (H) 38 - 234 U/L    Comment: Performed at [facility], [place] [address] [place], [place] [address]  I-Stat beta hCG blood, ED     Status: None   Collection Time: 08/31/19 12:51 AM  Result Value Ref Range   I-stat hCG, quantitative  <5.0 <5 mIU/mL   Comment 3            Comment:   GEST. AGE      CONC.  (mIU/mL)   <=1 WEEK        5 - 50     2 WEEKS       50 - 500     3 WEEKS       100 - 10,000     4 WEEKS     1,000 - 30,000        FEMALE AND NON-PREGNANT FEMALE:     LESS THAN 5 mIU/mL   SARS Coronavirus 2 [facility] order, Performed in [facility] hospital lab) Nasopharyngeal Nasopharyngeal Swab     Status: None   Collection Time: 08/31/19  2:39 PM   Specimen: Nasopharyngeal Swab  Result Value Ref Range   SARS Coronavirus 2 NEGATIVE NEGATIVE    Comment: (NOTE) If result is NEGATIVE SARS-CoV-2 target nucleic acids are NOT DETECTED. The SARS-CoV-2 RNA is generally detectable in upper and lower  respiratory specimens during the acute phase of infection. The lowest  concentration of SARS-CoV-2 viral copies this assay can detect is 250  copies / mL. A negative result does not preclude SARS-CoV-2 infection  and should not be used as the sole basis for treatment or other  patient management decisions.  A negative result may occur with  improper specimen collection / handling, submission of specimen other  than nasopharyngeal swab, presence of viral mutation(s) within the  areas targeted by this assay, and inadequate number of viral copies  (<250 copies / mL). A negative result must be combined with clinical  observations, patient history, and epidemiological information. If result is POSITIVE SARS-CoV-2 target nucleic acids are DETECTED. The SARS-CoV-2 RNA is generally detectable in upper and lower  respiratory specimens dur ing the acute phase of infection.  Positive  results are indicative of active infection with SARS-CoV-2.  Clinical  correlation with patient history and other diagnostic information is  necessary to determine patient infection status.  Positive results do  not rule out bacterial infection or co-infection with other viruses. If result is PRESUMPTIVE POSTIVE SARS-CoV-2 nucleic acids MAY BE  PRESENT.   A presumptive positive result was obtained on the submitted specimen  and confirmed on repeat testing.  While 2019 novel coronavirus  (SARS-CoV-2) nucleic acids may be present in the submitted sample  additional confirmatory testing may be necessary for epidemiological  and / or clinical management purposes  to differentiate between  SARS-CoV-2 and other Sarbecovirus currently known to infect humans.  If clinically indicated additional testing with an alternate test  methodology [phone]) is advised. The SARS-CoV-2 RNA is generally  detectable in upper and lower respiratory sp ecimens during the acute  phase of infection. The expected result is Negative. Fact Sheet for Patients:  StrictlyIdeas.no Fact Sheet for Healthcare Providers: BankingDealers.co.za This test is not yet approved or cleared by the [place] FDA and has been authorized for detection and/or diagnosis of SARS-CoV-2 by FDA under an Emergency Use Authorization (EUA).  This  EUA will remain in effect (meaning this test can be used) for the duration of the COVID-19 declaration under Section 564(b)(1) of the Act, 21 U.S.C. section 360bbb-3(b)(1), unless the authorization is terminated or revoked sooner. Performed at [facility], [address], [place], [place] [address]     Blood Alcohol level:  Lab Results  Component Value Date   [facility] <10 08/31/2019   ETH <5 05/11/2015    Metabolic Disorder Labs:  Lab Results  Component Value Date   HGBA1C 5.2 05/14/2015   MPG 103 05/14/2015   Lab Results  Component Value Date   PROLACTIN 18.6 06/10/2015   PROLACTIN 77.4 (H) 05/14/2015   Lab Results  Component Value Date   CHOL 143 05/14/2015   TRIG 120 05/14/2015   HDL 42 05/14/2015   CHOLHDL 3.4 05/14/2015   VLDL 24 05/14/2015   LDLCALC 77 05/14/2015    Current Medications: Current Facility-Administered Medications  Medication Dose  Route Frequency Provider Last Rate Last Dose  . levonorgestrel-ethinyl estradiol (SEASONALE) 0.15-0.03 MG per tablet 1 tablet  1 tablet Oral Daily Rankin, Shuvon B, NP      . LORazepam (ATIVAN) tablet 0.5 mg  0.5 mg Oral Q8H PRN Rankin, Shuvon B, NP   0.5 mg at 09/01/19 0728  . OLANZapine zydis (ZYPREXA) disintegrating tablet 5 mg  5 mg Oral QHS Rankin, Shuvon B, NP      . ziprasidone (GEODON) injection 20 mg  20 mg Intramuscular Once Malvin Johns, MD       PTA Medications: Medications Prior to Admission  Medication Sig Dispense Refill Last Dose  . levonorgestrel-ethinyl estradiol (SEASONALE) 0.15-0.03 MG tablet Take 1 tablet by mouth daily.     [name] LORazepam (ATIVAN) 0.5 MG tablet Take 0.5 mg by mouth every 8 (eight) hours as needed for anxiety.       Musculoskeletal: Strength & Muscle Tone: within normal limits Gait & Station: normal Patient leans: N/A  Psychiatric Specialty Exam: Physical Exam  Nursing note and vitals reviewed. Constitutional: She appears well-developed and well-nourished.  Cardiovascular: Normal rate and regular rhythm.    Review of Systems  Constitutional: Negative.   HENT: Negative.   Eyes: Negative.   Respiratory: Negative.   Cardiovascular: Negative.   Gastrointestinal: Negative.   Genitourinary: Negative.   Musculoskeletal: Negative.   Skin: Negative.   Neurological: Negative.   Endo/Heme/Allergies: Negative.   Denies all issues but not taking the review of systems seriously  There were no vitals taken for this visit.There is no height or weight on file to calculate BMI.  General Appearance: Casual  Eye Contact:  Fair  Speech:  Pressured  Volume:  Increased  Mood:  Angry, Irritable and Manic  Affect:  Congruent and Labile  Thought Process:  Disorganized, Irrelevant and Descriptions of Associations: Loose  Orientation:  Other:  Person place situation  Thought Content:  Illogical, Delusions and And states she has no recall of why she is in the  hospital  Suicidal Thoughts:  No  Homicidal Thoughts:  No  Memory:  Immediate;   Poor Recent;   Poor Remote;   Good  Judgement:  Impaired  Insight:  Lacking  Psychomotor Activity:  Increased  Concentration:  Concentration: Poor and Attention Span: Poor  Recall:  Poor  Fund of Knowledge:  Fair  Language:  Fair  Akathisia:  Negative  Handed:  Right  AIMS (if indicated):     Assets:  Architect Housing Leisure Time Physical Health Resilience Social  Support  ADL's:  Intact  Cognition:  WNL  Sleep:  Number of Hours: 1    Treatment Plan Summary: Daily contact with patient to assess and evaluate symptoms and progress in treatment and Medication management  Observation Level/Precautions:  15 minute checks  Laboratory:  UDS  Psychotherapy: Cognitive and reality based  Medications: Begin antipsychotic and mood stabilizer therapy as well as Sleep Aid  Consultations: None necessary  Discharge Concerns: Longer term stability  Estimated LOS: 5-7  Other: Axis I schizoaffective bipolar type/acute mania with delusions and hostility at least verbally we will use Geodon initially to calm her down as she is disruptive to the milieu and agitating other patients with psychotic and manic symptoms   *Mother reports Kasandra Knudsen was successful for 3 yrs + We will cross taper with Risperdal  Physician Treatment Plan for Primary Diagnosis: Use Risperdal for manic symptoms/may need long-acting injectable continue to monitor Long Term Goal(s): Improvement in symptoms so as ready for discharge  Short Term Goals: Ability to verbalize feelings will improve, Ability to demonstrate self-control will improve, Ability to identify and develop effective coping behaviors will improve and Ability to maintain clinical measurements within normal limits will improve  Physician Treatment Plan for Secondary Diagnosis: Active Problems:   Schizoaffective disorder, bipolar type  (HCC)  Long Term Goal(s): Improvement in symptoms so as ready for discharge  Short Term Goals: Ability to disclose and discuss suicidal ideas, Ability to demonstrate self-control will improve, Ability to identify and develop effective coping behaviors will improve and Ability to maintain clinical measurements within normal limits will improve  I certify that inpatient services furnished can reasonably be expected to improve the patient's condition.    Malvin Johns, MD 9/29/20208:22 AM

## 2019-09-01 NOTE — Progress Notes (Signed)
Pt came out yelling , pt encouraged to go back to her room. Pt starting to wake up , pt in room yelling , pt falling out in the floor. Pt up top the nursing station stating irrelevant things , theres wires and things under her bed and we need to have someone come to her room and look at it. Pt informed that no one would be coming to her room to look at her bed tonight.

## 2019-09-01 NOTE — Progress Notes (Signed)
Staff had to be placed outside of pt door to redirect , so pt would not wake up other patients

## 2019-09-01 NOTE — BHH Counselor (Signed)
Adult Comprehensive Assessment  Patient ID: Lisa [name], female   DOB: [birth year], 28 y.o.   MRN: [id number]  Information Source: Information source: Patient  Current Stressors:  Patient states their primary concerns and needs for treatment are:: "I have no idea." Patient states their goals for this hospitilization and ongoing recovery are:: "I would like to have peace and time and loved and compassionate and respect and dignitiy with friends and family" Educational / Learning stressors: Pt denies stressors Employment / Job issues: Pt denies stressors Family Relationships: Pt denies stressors Museum/gallery curator / Lack of resources (include bankruptcy): Pt denies stressors Housing / Lack of housing: Pt denies stressors Physical health (include injuries & life threatening diseases): Pt denies stressors Social relationships: Pt denies stressors Substance abuse: Pt denies stressors. Bereavement / Loss: Pt denies stressors.  Living/Environment/Situation:  Living Arrangements: (Pt reports that she refuses to answer the question) Living conditions (as described by patient or guardian): Pt refuses to answer the question Who else lives in the home?: Pt refuses to answer the question How long has patient lived in current situation?: Pt refuses to answer the question  Family History:  Marital status: Single Are you sexually active?: (Pt refuses to answer the question) What is your sexual orientation?: Pt refuses to answer the question Has your sexual activity been affected by drugs, alcohol, medication, or emotional stress?: Pt refuses to answer the question Does patient have children?: No  Childhood History:  By whom was/is the patient raised?: (Pt refuses to answer the question) Description of patient's relationship with caregiver when they were a child: Pt refuses to answer the question Patient's description of current relationship with people who raised him/her: Pt refuses to answer the  question How were you disciplined when you got in trouble as a child/adolescent?: Pt refuses to answer the question Does patient have siblings?: (Pt refuses to answer the question) Did patient suffer any verbal/emotional/physical/sexual abuse as a child?: (Pt refuses to answer the question) Did patient suffer from severe childhood neglect?: (Pt refuses to answer the question) Has patient ever been sexually abused/assaulted/raped as an adolescent or adult?: (Pt refuses to answer the question) Was the patient ever a victim of a crime or a disaster?: (Pt refuses to answer the question) Witnessed domestic violence?: (Pt refuses to answer the question) Has patient been effected by domestic violence as an adult?: (Pt refuses to answer the question)  Education:  Highest grade of school patient has completed: Scientist, water quality (Social Work) Currently a Ship broker?: No Learning disability?: No  Employment/Work Situation:   Employment situation: Employed Where is patient currently employed?: [employer] long has patient been employed?: 1.5 years Patient's job has been impacted by current illness: No What is the longest time patient has a held a job?: Pt refuses to answer the question Where was the patient employed at that time?: Pt refuses to answer the question Did You Receive Any Psychiatric Treatment/Services While in the [employer]?: No Are There Guns or Other Weapons in [place]?: No  Financial Resources:   Financial resources: Income from employment, Private insurance Does patient have a representative payee or guardian?: No  Alcohol/Substance Abuse:   What has been your use of drugs/alcohol within the last 12 months?: Pt denies substance abuse. If attempted suicide, did drugs/alcohol play a role in this?: No Alcohol/Substance Abuse Treatment Hx: Denies past history Has alcohol/substance abuse ever caused legal problems?: No  Social Support System:   Describe Community Support  System: Pt refuses to answer the  question Type of faith/religion: Pt refuses to answer the question How does patient's faith help to cope with current illness?: Pt refuses to answer the question  Leisure/Recreation:   Leisure and Hobbies: Music  Strengths/Needs:   What is the patient's perception of their strengths?: Plyometric Workouts Patient states they can use these personal strengths during their treatment to contribute to their recovery: "Helps me to become physically fit to be able to do anything" Patient states these barriers may affect/interfere with their treatment: N/A Patient states these barriers may affect their return to the community: N/A Other important information patient would like considered in planning for their treatment: N/A  Discharge Plan:   Currently receiving community mental health services: Yes (From Whom)([facility]) Patient states concerns and preferences for aftercare planning are: [facility]  -Valma Cava (therapist) Patient states they will know when they are safe and ready for discharge when: "It feels like I can walk out of these doors and get in my car and make it to my home without being followed, attacked, etc". Does patient have access to transportation?: Yes(personal car) Does patient have financial barriers related to discharge medications?: No Will patient be returning to same living situation after discharge?: Yes(home)  Summary/Recommendations:   Summary and Recommendations (to be completed by the evaluator): Patient is a 28 year old female who per her father has been tapered off of her medications which has resulted in her doing poorly. Patient's diagnosis is: Schizoaffective disorder, bipolar type (HCC). Recommendations for pt include: crisis stabilization, therapeutic milieu, medication management, attend and participate in group therapy, and development of a comprehensive mental wellness plan.  Delphia Grates. 09/01/2019

## 2019-09-01 NOTE — Progress Notes (Signed)
Per past notes pt has Hx of using K-2

## 2019-09-01 NOTE — [facility] Suicide Risk Assessment (Signed)
[facility] INPATIENT:  Family/Significant Other Suicide Prevention Education  Suicide Prevention Education:  Patient Refusal for Family/Significant Other Suicide Prevention Education: The patient Lisa [name] has refused to provide written consent for family/significant other to be provided Family/Significant Other Suicide Prevention Education during admission and/or prior to discharge.  Physician notified.  Trecia Rogers 09/01/2019, 3:57 PM

## 2019-09-01 NOTE — Progress Notes (Signed)
Recreation Therapy Notes  Date: 9.29.20 Time: 1000 Location: 500 Hall Dayroom  Group Topic: Coping Skills  Goal Area(s) Addresses:  Patient will identify positive coping skills. Patient will identify benefits of using coping skills post d/c.   Intervention: Worksheet, pencils, white board, dry erase marker  Activity: Mind map.  LRT and patients filled in the first 8 boxes of the mind map together (substance abuse, psychological abuse, eating disorders, physical abuse, family, anger management, finances and work).  Patients were then given time to come up with at least 3 coping skills for each area identified.  The group would come back together and LRT wrote the coping skills on the board.  Education: Coping Skills, Discharge Planning.   Education Outcome: Acknowledges understanding/In group clarification offered/Needs additional education.   Clinical Observations/Feedback: Pt did not attend group.    Annie Saephan, LRT/CTRS         Lisa [name] 09/01/2019 11:20 AM 
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
## 2019-09-02 MED ORDER — DIPHENHYDRAMINE HCL 50 MG/ML IJ SOLN
50.0000 mg | Freq: Once | INTRAMUSCULAR | Status: AC
  Administered 2019-09-02: 08:00:00 50 mg via INTRAMUSCULAR
  Filled 2019-09-02: qty 1

## 2019-09-02 MED ORDER — LORAZEPAM 2 MG/ML IJ SOLN
4.0000 mg | Freq: Once | INTRAMUSCULAR | Status: AC
  Administered 2019-09-02: 4 mg via INTRAMUSCULAR

## 2019-09-02 MED ORDER — HALOPERIDOL LACTATE 5 MG/ML IJ SOLN
INTRAMUSCULAR | Status: AC
  Administered 2019-09-02: 10 mg via INTRAMUSCULAR
  Filled 2019-09-02: qty 2

## 2019-09-02 MED ORDER — HALOPERIDOL LACTATE 5 MG/ML IJ SOLN
10.0000 mg | Freq: Once | INTRAMUSCULAR | Status: AC
  Administered 2019-09-02: 08:00:00 10 mg via INTRAMUSCULAR
  Filled 2019-09-02: qty 2

## 2019-09-02 MED ORDER — DIPHENHYDRAMINE HCL 50 MG/ML IJ SOLN
INTRAMUSCULAR | Status: AC
  Administered 2019-09-02: 50 mg via INTRAMUSCULAR
  Filled 2019-09-02: qty 1

## 2019-09-02 NOTE — Progress Notes (Signed)
Pt up to the nursing station asking irrelevant questions " If breakfast is at 645 who makes the call for breakfast" . Pt informed we could talk about this at 6 am , this was not the time. Pt went back to her room making noise slamming her bathroom door and throwing things in her room requiring redirection

## 2019-09-02 NOTE — Progress Notes (Signed)
Female staff member placed outside pt room to help redirect pt , so pt does not disturb peers on the unit

## 2019-09-02 NOTE — Progress Notes (Signed)
[facility] Second Physician Opinion Progress Note for Medication Administration to Non-consenting Patients (For Involuntarily Committed Patients)  Patient: MARYALICE PASLEY Date of Birth: [id number] MRN: [id number]  Reason for the Medication: The patient, without the benefit of the specific treatment measure, is incapable of participating in any available treatment plan that will give the patient a realistic opportunity of improving the patient's condition.  Consideration of Side Effects: Consideration of the side effects related to the medication plan has been given.  Rationale for Medication Administration:  Asked by Dr. Jake Samples, Attending on case, to evaluate for second opinion for medication administration to non consenting patient. 80 y old female, history of chronic mental illness, has been diagnosed with Schizoaffective Disorder. Presented for manic symptoms,irritability, anger, paranoia. On unit has presented irritable , disruptive to milieu, agitating other patients, yelling at staff, using foul language, exhibiting sexually inappropriate behaviors. Has needed IM medication for acute agitation. Has been refusing to take medication, demonstrating limited insight.  As per above presentation , agree with need for forced medication.      Jenne Campus, MD 09/02/19  5:02 PM   This documentation is good for (7) seven days from the date of the MD signature. New documentation must be completed every seven (7) days with detailed justification in the medical record if the patient requires continued non-emergent administration of psychotropic medications.

## 2019-09-02 NOTE — Progress Notes (Signed)
Pt constantly coming out her room asking irrelevant questions. Pt continues to come out requesting water. Pt given multiple cups of water, then a pitcher. Pt on the hall standing in front of other patients rooms. Pt starting to wake other patients up. Pt redirected to her room and informed she could not disturb other patients.

## 2019-09-02 NOTE — Progress Notes (Signed)
[facility] MD Progress Note  09/02/2019 7:27 AM Lisa [name]  MRN:  [id number] Subjective:    This 28 year old patient with a known schizoaffective/bipolar type condition was admitted due to a relapse and she continues to refuse medication, continues to be very disruptive.  During the basic mental status exam she states "I do not like you" and begins using a great deal of profanity, screaming at the staff, screaming at me again using vulgar and sexually inappropriate language. Patient is of course seen at the nurses station with staff around as she is so sexually focused in her statements at times that staff is not to see her in private She has received Geodon IM but has had little impact so the staff is requesting a different IM medication for her disruptive behaviors.  Again she is very disruptive to the milieu of the unit and very agitating to the other patients who are trying to achieve stability.  Principal Problem: Manic with psychosis/anger and hostility prominent Diagnosis: Active Problems:   Schizoaffective disorder, bipolar type (HCC)  Total Time spent with patient: 20 minutes  Past Psychiatric History: Previously had responded to lurasidone by reports of family  Past Medical History:  Past Medical History:  Diagnosis Date  . Anxiety     Past Surgical History:  Procedure Laterality Date  . extraction of wisdom teeth     Family History:  Family History  Problem Relation Age of Onset  . Schizophrenia Mother   . Depression Father   . Seizures Sister    Family Psychiatric  History: see eval Social History:  Social History   Substance and Sexual Activity  Alcohol Use Yes   Comment: casually     Social History   Substance and Sexual Activity  Drug Use No  . Types: Marijuana   Comment: THC last used 2 yrs ago, unsure of amount    Social History   Socioeconomic History  . Marital status: Single    Spouse name: Not on file  . Number of children: Not on file  . Years  of education: Not on file  . Highest education level: Not on file  Occupational History  . Not on file  Social Needs  . Financial resource strain: Not on file  . Food insecurity    Worry: Not on file    Inability: Not on file  . Transportation needs    Medical: Not on file    Non-medical: Not on file  Tobacco Use  . Smoking status: Former Smoker    Packs/day: 0.25    Years: 0.50    Pack years: 0.12    Types: Cigarettes    Quit date: 09/03/2015    Years since quitting: 4.0  . Smokeless tobacco: Never Used  Substance and Sexual Activity  . Alcohol use: Yes    Comment: casually  . Drug use: No    Types: Marijuana    Comment: THC last used 2 yrs ago, unsure of amount  . Sexual activity: Not Currently    Comment: Plan B Pill  Lifestyle  . Physical activity    Days per week: Not on file    Minutes per session: Not on file  . Stress: Not on file  Relationships  . Social Musician on phone: Not on file    Gets together: Not on file    Attends religious service: Not on file    Active member of club or organization: Not on file  Attends meetings of clubs or organizations: Not on file    Relationship status: Not on file  Other Topics Concern  . Not on file  Social History Narrative  . Not on file   Additional Social History:                         Sleep: Poor  Appetite:  Poor  Current Medications: Current Facility-Administered Medications  Medication Dose Route Frequency Provider Last Rate Last Dose  . benztropine (COGENTIN) tablet 1 mg  1 mg Oral BID Malvin JohnsFarah, Kadence Mimbs, MD      . carvedilol (COREG) tablet 25 mg  25 mg Oral BID WC Malvin JohnsFarah, Starasia Sinko, MD      . diphenhydrAMINE (BENADRYL) injection 50 mg  50 mg Intramuscular Once Malvin JohnsFarah, Jozlynn Plaia, MD      . haloperidol lactate (HALDOL) injection 10 mg  10 mg Intramuscular Once Malvin JohnsFarah, Fishel Wamble, MD      . levonorgestrel-ethinyl estradiol (SEASONALE) 0.15-0.03 MG per tablet 1 tablet  1 tablet Oral Daily Rankin, Shuvon  B, NP      . LORazepam (ATIVAN) injection 4 mg  4 mg Intramuscular Once Malvin JohnsFarah, Yarexi Pawlicki, MD      . LORazepam (ATIVAN) tablet 0.5 mg  0.5 mg Oral Q8H PRN Rankin, Shuvon B, NP   0.5 mg at 09/01/19 0728  . omega-3 acid ethyl esters (LOVAZA) capsule 1 g  1 g Oral BID Malvin JohnsFarah, Cedarius Kersh, MD      . risperiDONE (RISPERDAL) tablet 3 mg  3 mg Oral BID Malvin JohnsFarah, Sefora Tietje, MD      . temazepam (RESTORIL) capsule 30 mg  30 mg Oral QHS Malvin JohnsFarah, Tarahji Ramthun, MD      . ziprasidone (GEODON) injection 20 mg  20 mg Intramuscular Once Malvin JohnsFarah, Virdie Penning, MD        Lab Results:  Results for orders placed or performed during the hospital encounter of 08/31/19 (from the past 48 hour(s))  SARS Coronavirus 2 [facility](Hospital order, [facility]) Nasopharyngeal Nasopharyngeal Swab     Status: None   Collection Time: 08/31/19  2:39 PM   Specimen: Nasopharyngeal Swab  Result Value Ref Range   SARS Coronavirus 2 NEGATIVE NEGATIVE    Comment: (NOTE) If result is NEGATIVE SARS-CoV-2 target nucleic acids are NOT DETECTED. The SARS-CoV-2 RNA is generally detectable in upper and lower  respiratory specimens during the acute phase of infection. The lowest  concentration of SARS-CoV-2 viral copies this assay can detect is 250  copies / mL. A negative result does not preclude SARS-CoV-2 infection  and should not be used as the sole basis for treatment or other  patient management decisions.  A negative result may occur with  improper specimen collection / handling, submission of specimen other  than nasopharyngeal swab, presence of viral mutation(s) within the  areas targeted by this assay, and inadequate number of viral copies  (<250 copies / mL). A negative result must be combined with clinical  observations, patient history, and epidemiological information. If result is POSITIVE SARS-CoV-2 target nucleic acids are DETECTED. The SARS-CoV-2 RNA is generally detectable in upper and lower  respiratory specimens dur ing the acute  phase of infection.  Positive  results are indicative of active infection with SARS-CoV-2.  Clinical  correlation with patient history and other diagnostic information is  necessary to determine patient infection status.  Positive results do  not rule out bacterial infection or co-infection with other viruses. If result is PRESUMPTIVE POSTIVE SARS-CoV-2  nucleic acids MAY BE PRESENT.   A presumptive positive result was obtained on the submitted specimen  and confirmed on repeat testing.  While 2019 novel coronavirus  (SARS-CoV-2) nucleic acids may be present in the submitted sample  additional confirmatory testing may be necessary for epidemiological  and / or clinical management purposes  to differentiate between  SARS-CoV-2 and other Sarbecovirus currently known to infect humans.  If clinically indicated additional testing with an alternate test  methodology [phone]) is advised. The SARS-CoV-2 RNA is generally  detectable in upper and lower respiratory sp ecimens during the acute  phase of infection. The expected result is Negative. Fact Sheet for Patients:  BoilerBrush.com.cy Fact Sheet for Healthcare Providers: https://pope.com/ This test is not yet approved or cleared by the [place] FDA and has been authorized for detection and/or diagnosis of SARS-CoV-2 by FDA under an Emergency Use Authorization (EUA).  This EUA will remain in effect (meaning this test can be used) for the duration of the COVID-19 declaration under Section 564(b)(1) of the Act, 21 U.S.C. section 360bbb-3(b)(1), unless the authorization is terminated or revoked sooner. Performed at [facility], [address], [place], [place] [address]     Blood Alcohol level:  Lab Results  Component Value Date   ETH <10 08/31/2019   ETH <5 05/11/2015    Metabolic Disorder Labs: Lab Results  Component Value Date   HGBA1C 5.2 05/14/2015   MPG  103 05/14/2015   Lab Results  Component Value Date   PROLACTIN 18.6 06/10/2015   PROLACTIN 77.4 (H) 05/14/2015   Lab Results  Component Value Date   CHOL 143 05/14/2015   TRIG 120 05/14/2015   HDL 42 05/14/2015   CHOLHDL 3.4 05/14/2015   VLDL 24 05/14/2015   LDLCALC 77 05/14/2015    Physical Findings: AIMS:  , ,  ,  ,    CIWA:    COWS:     Musculoskeletal: Strength & Muscle Tone: within normal limits Gait & Station: normal Patient leans: N/A  Psychiatric Specialty Exam: Physical Exam  ROS  There were no vitals taken for this visit.There is no height or weight on file to calculate BMI.  General Appearance: Casual  Eye Contact:  Good  Speech:  Pressured  Volume:  Increased  Mood:  Manic/untreated at present  Affect:  Labile  Thought Process:  Disorganized and Irrelevant  Orientation:  Other:  Will not answer but presumed to person place and situation  Thought Content:  Illogical, Delusions and Paranoid Ideation  Suicidal Thoughts:  No  Homicidal Thoughts:  No  Memory:  Immediate;   Poor Recent;   Poor Remote;   Fair  Judgement:  Impaired  Insight:  Lacking  Psychomotor Activity:  Normal  Concentration:  Concentration: Poor and Attention Span: Poor  Recall:  Poor  Fund of Knowledge:  Fair  Language: Profane/insulting/rambling and manic  Akathisia:  Negative  Handed:  Right  AIMS (if indicated):     Assets:  Physical Health Resilience Social Support  ADL's:  Intact  Cognition:  WNL  Sleep:  Number of Hours: 2.25     Treatment Plan Summary: Daily contact with patient to assess and evaluate symptoms and progress in treatment and Medication management  We will give IM medications this morning as she is agitating other patients verbally abusive to staff threatening staff, so forth Has refused medication so far will get forced medication consult today Simply will not recover without antipsychotics at this point in time  continue to attempt to engage in  reality based therapy continue to monitor on current precautions  Bralen Wiltgen, MD 09/02/2019, 7:27 AM

## 2019-09-02 NOTE — Progress Notes (Signed)
Pt continues to get no sleep, pt slept 2.25 hrs last night.

## 2019-09-02 NOTE — Progress Notes (Signed)
Adult Psychoeducational Group Note  Date:  09/02/2019 Time:  2:00 AM  Group Topic/Focus:  Wrap-Up Group:   The focus of this group is to help patients review their daily goal of treatment and discuss progress on daily workbooks.  Participation Level:  Active  Participation Quality:  Appropriate  Affect:  Appropriate  Cognitive:  Appropriate  Insight: Appropriate  Engagement in Group:  Developing/Improving  Modes of Intervention:  Discussion  Additional Comments: Pt stated her goal for today was to focus on her treatment plan. Pt stated she felt she accomplished her goal today. Pt stated her relationship with her remain the same since she been here.  Pt stated she felt better about herself today. Pt rated her overall day an 7 out of 10. Pt stated her appetite was pretty good today. Pt stated her goal for tonight is to get some rest. Pt did complain of pain tonight. Pt stated she was not hearing or seeing anything that was not there. Pt stated she had no thoughts of harming herself or others. Pt stated she would alert staff if anything changes.   Candy Sledge 09/02/2019, 2:00 AM

## 2019-09-02 NOTE — Progress Notes (Signed)
"  what time does the doctor come in " pt informed the doctor will be in in the AM" are you fucking kidding me I don't have to speak to a doctor and be killed" pt continued to get loud on the unit starting to wake up people on the unit. Pt the pushed the STARR button

## 2019-09-02 NOTE — Progress Notes (Signed)
Pt took her bags and placed them by the double doors like she was planning to leave, but would not talk to staff

## 2019-09-02 NOTE — Progress Notes (Signed)
Adult Psychoeducational Group Note  Date:  09/02/2019 Time:  9:38 PM  Group Topic/Focus:  Wrap-Up Group:   The focus of this group is to help patients review their daily goal of treatment and discuss progress on daily workbooks.  Participation Level:  Active  Participation Quality:  Appropriate  Affect:  Appropriate  Cognitive:  Oriented  Insight: Appropriate  Engagement in Group:  Engaged  Modes of Intervention:  Education and Support  Additional Comments:  Patient attended and participated in group tonight. She reports having an OK day.She went outside, went for meals, cried and talked to her doctor.  Salley Scarlet [facility] 09/02/2019, 9:38 PM

## 2019-09-02 NOTE — Progress Notes (Signed)
Recreation Therapy Notes  Date: 9.30.20 Time: 0945 Location: [address] Day Room  Group Topic: Communication, Team Building, Problem Solving  Goal Area(s) Addresses:  Patient will effectively work with peer towards shared goal.  Patient will identify skill used to make activity successful.  Patient will identify how skills used during activity can be used to reach post d/c goals.   Intervention: STEM Activity   Activity: [employer].  In groups, patients were to build a free standing tower as tall as possible.  During the course of the activity, LRT would inform patients of budget cuts.  During these budget cuts, patients will have to put one hand behind their backs and not speak.  With the addition of funds, patients will be able to return to using both hands and speaking.     Education: Education officer, community, Dentist.   Education Outcome: Acknowledges education  Clinical Observations/Feedback:  Pt did not attend group.     Victorino Sparrow, LRT/CTRS         Victorino Sparrow A 09/02/2019 11:25 AM

## 2019-09-02 NOTE — Progress Notes (Signed)
Pt up trying to pace the unit, pt stated she could walk in her room due to having no roommate. " I can't sleep" pt asked to take sleep medication , continues to refuse medication.

## 2019-09-02 NOTE — Tx Team (Signed)
Interdisciplinary Treatment and Diagnostic Plan Update  09/02/2019 Time of Session: 10:20am Lisa [name] MRN: [id number]  Principal Diagnosis: <principal problem not specified>  Secondary Diagnoses: Active Problems:   Schizoaffective disorder, bipolar type (HCC)   Current Medications:  Current Facility-Administered Medications  Medication Dose Route Frequency Provider Last Rate Last Dose  . benztropine (COGENTIN) tablet 1 mg  1 mg Oral BID Lisa Hai, MD      . carvedilol (COREG) tablet 25 mg  25 mg Oral BID WC Lisa Hai, MD      . levonorgestrel-ethinyl estradiol (SEASONALE) 0.15-0.03 MG per tablet 1 tablet  1 tablet Oral Daily Rankin, Lisa B, NP      . LORazepam (ATIVAN) tablet 0.5 mg  0.5 mg Oral Q8H PRN Rankin, Lisa B, NP   0.5 mg at 09/01/19 0728  . omega-3 acid ethyl esters (LOVAZA) capsule 1 g  1 g Oral BID Lisa Hai, MD      . risperiDONE (RISPERDAL) tablet 3 mg  3 mg Oral BID Lisa Hai, MD   3 mg at 09/02/19 1037  . temazepam (RESTORIL) capsule 30 mg  30 mg Oral QHS Lisa Hai, MD      . ziprasidone (GEODON) injection 20 mg  20 mg Intramuscular Once Lisa Hai, MD       PTA Medications: Medications Prior to Admission  Medication Sig Dispense Refill Last Dose  . levonorgestrel-ethinyl estradiol (SEASONALE) 0.15-0.03 MG tablet Take 1 tablet by mouth daily.     [name] LORazepam (ATIVAN) 0.5 MG tablet Take 0.5 mg by mouth every 8 (eight) hours as needed for anxiety.       Patient Stressors:    Patient Strengths:    Treatment Modalities: Medication Management, Group therapy, Case management,  1 to 1 session with clinician, Psychoeducation, Recreational therapy.   Physician Treatment Plan for Primary Diagnosis: <principal problem not specified> Long Term Goal(s): Improvement in symptoms so as ready for discharge Improvement in symptoms so as ready for discharge   Short Term Goals: Ability to verbalize feelings will improve Ability to demonstrate  self-control will improve Ability to identify and develop effective coping behaviors will improve Ability to maintain clinical measurements within normal limits will improve Ability to disclose and discuss suicidal ideas Ability to demonstrate self-control will improve Ability to identify and develop effective coping behaviors will improve Ability to maintain clinical measurements within normal limits will improve  Medication Management: Evaluate patient's response, side effects, and tolerance of medication regimen.  Therapeutic Interventions: 1 to 1 sessions, Unit Group sessions and Medication administration.  Evaluation of Outcomes: Not Progressing  Physician Treatment Plan for Secondary Diagnosis: Active Problems:   Schizoaffective disorder, bipolar type (Lisa [name])  Long Term Goal(s): Improvement in symptoms so as ready for discharge Improvement in symptoms so as ready for discharge   Short Term Goals: Ability to verbalize feelings will improve Ability to demonstrate self-control will improve Ability to identify and develop effective coping behaviors will improve Ability to maintain clinical measurements within normal limits will improve Ability to disclose and discuss suicidal ideas Ability to demonstrate self-control will improve Ability to identify and develop effective coping behaviors will improve Ability to maintain clinical measurements within normal limits will improve     Medication Management: Evaluate patient's response, side effects, and tolerance of medication regimen.  Therapeutic Interventions: 1 to 1 sessions, Unit Group sessions and Medication administration.  Evaluation of Outcomes: Not Progressing   RN Treatment Plan for Primary Diagnosis: <principal problem not specified> Long Term Goal(s): Knowledge  of disease and therapeutic regimen to maintain health will improve  Short Term Goals: Ability to participate in decision making will improve, Ability to verbalize  feelings will improve, Ability to disclose and discuss suicidal ideas, Ability to identify and develop effective coping behaviors will improve and Compliance with prescribed medications will improve  Medication Management: RN will administer medications as ordered by provider, will assess and evaluate patient's response and provide education to patient for prescribed medication. RN will report any adverse and/or side effects to prescribing provider.  Therapeutic Interventions: 1 on 1 counseling sessions, Psychoeducation, Medication administration, Evaluate responses to treatment, Monitor vital signs and CBGs as ordered, Perform/monitor CIWA, COWS, AIMS and Fall Risk screenings as ordered, Perform wound care treatments as ordered.  Evaluation of Outcomes: Not Progressing   LCSW Treatment Plan for Primary Diagnosis: <principal problem not specified> Long Term Goal(s): Safe transition to appropriate next level of care at discharge, Engage patient in therapeutic group addressing interpersonal concerns.  Short Term Goals: Engage patient in aftercare planning with referrals and resources and Increase skills for wellness and recovery  Therapeutic Interventions: Assess for all discharge needs, 1 to 1 time with Social worker, Explore available resources and support systems, Assess for adequacy in community support network, Educate family and significant other(s) on suicide prevention, Complete Psychosocial Assessment, Interpersonal group therapy.  Evaluation of Outcomes: Not Progressing   Progress in Treatment: Attending groups: No. Participating in groups: No. Taking medication as prescribed: No. Toleration medication: No. Family/Significant other contact made: Yes, individual(s) contacted:  with pt; pt declined Patient understands diagnosis: No. Discussing patient identified problems/goals with staff: Yes. Medical problems stabilized or resolved: Yes. Denies suicidal/homicidal ideation:  Yes. Issues/concerns per patient self-inventory: No. Other:   New problem(s) identified: No, Describe:  None  New Short Term/Long Term Goal(s): Medication stabilization, elimination of SI thoughts, and development of a comprehensive mental wellness plan.   Patient Goals:  Patient did not identify a goal  Discharge Plan or Barriers: Patient will discharge and continue with online therapy through [facility].   Reason for Continuation of Hospitalization: Aggression Mania Medication stabilization  Estimated Length of Stay: 3-5 days   Attendees: Patient:  09/02/2019   Physician: Dr. Malvin Johns, MD 09/02/2019   Nursing: Marton Redwood, RN 09/02/2019  RN Care Manager: 09/02/2019   Social Worker: Stephannie Peters, LCSW 09/02/2019   Recreational Therapist:  09/02/2019   MSW Intern: Earlyne Iba  09/02/2019   Other:  09/02/2019   Other: 09/02/2019      Scribe for Treatment Team: Delphia Grates, LCSW 09/02/2019 11:04 AM

## 2019-09-02 NOTE — Progress Notes (Signed)
Pt continues to stand by the double doors with her bags on the floor. Pt continues to ignore staff when asked what she is doing and what issues she was having.

## 2019-09-03 MED ORDER — LITHIUM CARBONATE ER 450 MG PO TBCR
450.0000 mg | EXTENDED_RELEASE_TABLET | Freq: Two times a day (BID) | ORAL | Status: DC
  Administered 2019-09-03 – 2019-09-08 (×11): 450 mg via ORAL
  Filled 2019-09-03 (×17): qty 1

## 2019-09-03 MED ORDER — RISPERIDONE 2 MG PO TABS
4.0000 mg | ORAL_TABLET | Freq: Two times a day (BID) | ORAL | Status: AC
  Administered 2019-09-03 – 2019-09-05 (×5): 4 mg via ORAL
  Filled 2019-09-03 (×9): qty 2

## 2019-09-03 MED ORDER — CLONAZEPAM 1 MG PO TABS
1.0000 mg | ORAL_TABLET | Freq: Three times a day (TID) | ORAL | Status: AC
  Administered 2019-09-03 – 2019-09-05 (×6): 1 mg via ORAL
  Filled 2019-09-03 (×5): qty 1

## 2019-09-03 NOTE — Progress Notes (Signed)
D: Pt continues to be oppositional, but pt was not as rude. Pt had complaints of the medication being too strong. Pt encouraged to talk to the doctor.  A: Pt was offered support and encouragement. Pt was given scheduled medication. Pt was encourage to attend groups. Q 15 minute checks were done for safety.  R:Pt attends groups and interacts  with peers and staff. Pt is taking medication. Pt receptive to treatment and safety maintained on unit.

## 2019-09-03 NOTE — Progress Notes (Signed)
Patient ID: Lisa [name], female   DOB: [birth year], 28 y.o.   MRN: [id number]  CSW faxed a note for pt's employer to [phone].

## 2019-09-03 NOTE — [facility] Group Notes (Signed)
[facility] LCSW Group Therapy Note   Date and Time: 09/03/2019 @ 1:30pm  Type of Group and Topic: Psychoeducational Group: Discharge Planning  Participation Level: [facility] PARTICIPATION LEVEL: Minimal  Mood: Pleasant   Description of Group: Discharge planning group reviews patient's anticipated discharge plans and assists patients to anticipate and address any barriers to wellness/recovery in the community. Suicide prevention education is reviewed with patients in group. Therapeutic Goals 1. Patients will state their anticipated discharge plan and mental health aftercare 2. Patients will identify potential barriers to wellness in the community setting 3. Patients will engage in problem solving, solution focused discussion of ways to anticipate and address barriers to wellness/recovery     Summary of Patient Progress:  Patient was engaged throughout group therapy. She did not talk until the end of group but when she did. She was very pleasant and gave great advice to fellow group members. Patient was able to identify how she would assist herself in different scenarios that could possibly result in the patient wanting to come back to the hospital and instead figure out how to cope with the scenarios. Patient identified needing supports and a safe place (safe place that she considers safe) when she is discharged into the community. Patient states that advocating for herself is her way of problem solving.    Plan for Discharge/Comments:    Transportation Means: Pt's own car     Supports: Job and family      Therapeutic Modalities: Motivational Interviewing

## 2019-09-03 NOTE — Progress Notes (Signed)
[facility] MD Progress Note  09/03/2019 9:07 AM Lisa [name]  MRN:  [id number] Subjective:    Patient has written a long letter about a page and she tells me that the police dragged her out of her home that she is not ill she will not take medication so forth she asks which mask is the most protective for the virus that is the subject of the pandemic, and then she states she simply will not take medications.  She become sarcastic and irritable throughout the interview.  She did however take some of her morning meds because she knew we had a forced medication order and I did explain to her that we cannot force medications.  We also explained that she been very stable on the lurasidone for period of time but she does not want to take that either.  No EPS or TD but minimally compliant  Principal Problem: Exacerbation of underlying schizoaffective-type condition complicated by noncompliance here Diagnosis: Active Problems:   Schizoaffective disorder, bipolar type (HCC)  Total Time spent with patient: 20 minutes  Past Medical History:  Past Medical History:  Diagnosis Date  . Anxiety     Past Surgical History:  Procedure Laterality Date  . extraction of wisdom teeth     Family History:  Family History  Problem Relation Age of Onset  . Schizophrenia Mother   . Depression Father   . Seizures Sister    Family Psychiatric  History: no new Social History:  Social History   Substance and Sexual Activity  Alcohol Use Yes   Comment: casually     Social History   Substance and Sexual Activity  Drug Use No  . Types: Marijuana   Comment: THC last used 2 yrs ago, unsure of amount    Social History   Socioeconomic History  . Marital status: Single    Spouse name: Not on file  . Number of children: Not on file  . Years of education: Not on file  . Highest education level: Not on file  Occupational History  . Not on file  Social Needs  . Financial resource strain: Not on file  .  Food insecurity    Worry: Not on file    Inability: Not on file  . Transportation needs    Medical: Not on file    Non-medical: Not on file  Tobacco Use  . Smoking status: Former Smoker    Packs/day: 0.25    Years: 0.50    Pack years: 0.12    Types: Cigarettes    Quit date: 09/03/2015    Years since quitting: 4.0  . Smokeless tobacco: Never Used  Substance and Sexual Activity  . Alcohol use: Yes    Comment: casually  . Drug use: No    Types: Marijuana    Comment: THC last used 2 yrs ago, unsure of amount  . Sexual activity: Not Currently    Comment: Plan B Pill  Lifestyle  . Physical activity    Days per week: Not on file    Minutes per session: Not on file  . Stress: Not on file  Relationships  . Social Musician on phone: Not on file    Gets together: Not on file    Attends religious service: Not on file    Active member of club or organization: Not on file    Attends meetings of clubs or organizations: Not on file    Relationship status: Not on file  Other Topics Concern  . Not on file  Social History Narrative  . Not on file   Additional Social History:                         Sleep: poor  Appetite:  Fair  Current Medications: Current Facility-Administered Medications  Medication Dose Route Frequency Provider Last Rate Last Dose  . benztropine (COGENTIN) tablet 1 mg  1 mg Oral BID Malvin JohnsFarah, Lucero Auzenne, MD   1 mg at 09/03/19 0803  . carvedilol (COREG) tablet 25 mg  25 mg Oral BID WC Malvin JohnsFarah, Giordan Fordham, MD   25 mg at 09/03/19 0803  . levonorgestrel-ethinyl estradiol (SEASONALE) 0.15-0.03 MG per tablet 1 tablet  1 tablet Oral Daily Rankin, Shuvon B, NP      . lithium carbonate (ESKALITH) CR tablet 450 mg  450 mg Oral Q12H Malvin JohnsFarah, Rykin Route, MD      . LORazepam (ATIVAN) tablet 0.5 mg  0.5 mg Oral Q8H PRN Rankin, Shuvon B, NP   0.5 mg at 09/01/19 0728  . omega-3 acid ethyl esters (LOVAZA) capsule 1 g  1 g Oral BID Malvin JohnsFarah, Sidharth Leverette, MD   1 g at 09/03/19 0803  .  risperiDONE (RISPERDAL) tablet 4 mg  4 mg Oral BID Malvin JohnsFarah, Alexsus Papadopoulos, MD      . temazepam (RESTORIL) capsule 30 mg  30 mg Oral QHS Malvin JohnsFarah, Candy Ziegler, MD      . ziprasidone (GEODON) injection 20 mg  20 mg Intramuscular Once Malvin JohnsFarah, Leili Eskenazi, MD        Lab Results: No results found for this or any previous visit (from the past 48 hour(s)).  Blood Alcohol level:  Lab Results  Component Value Date   ETH <10 08/31/2019   ETH <5 05/11/2015    Metabolic Disorder Labs: Lab Results  Component Value Date   HGBA1C 5.2 05/14/2015   MPG 103 05/14/2015   Lab Results  Component Value Date   PROLACTIN 18.6 06/10/2015   PROLACTIN 77.4 (H) 05/14/2015   Lab Results  Component Value Date   CHOL 143 05/14/2015   TRIG 120 05/14/2015   HDL 42 05/14/2015   CHOLHDL 3.4 05/14/2015   VLDL 24 05/14/2015   LDLCALC 77 05/14/2015   Musculoskeletal: Strength & Muscle Tone: within normal limits Gait & Station: normal Patient leans: N/A  Psychiatric Specialty Exam: Physical Exam  ROS  Blood pressure (!) 142/97, pulse (!) 120.There is no height or weight on file to calculate BMI.  General Appearance: Casual  Eye Contact:  Fair  Speech:  Pressured  Volume:  Increased  Mood:  Angry and Irritable  Affect:  Congruent  Thought Process:  Irrelevant and Descriptions of Associations: Loose  Orientation:  Full (Time, Place, and Person)  Thought Content:  Illogical and Delusions  Suicidal Thoughts:  No  Homicidal Thoughts:  No  Memory:  Immediate;   Fair Recent;   Fair Remote;   Good  Judgement: poor  Insight:  Lacking  Psychomotor Activity:  Normal  Concentration:  Concentration: Fair and Attention Span: Fair  Recall:  [employer]Fair  Fund of Knowledge:  Fair  Language:  Good  Akathisia:  Negative  Handed:  Right  AIMS (if indicated):     Assets:  Leisure Time Physical Health Resilience Social Support  ADL's:  Intact  Cognition:  WNL  Sleep:  Number of Hours: 2.25     Treatment Plan Summary: Daily contact  with patient to assess and evaluate symptoms and progress in treatment  and Medication management Continue current precautions continue to encourage compliance again we do have the forced medication order as needed continue to keep in touch with family left a message for father today.  No change in precautions/add lithium as she has been prescribed that previously it is unclear why it was stopped   [name], MD 09/03/2019, 9:07 AM

## 2019-09-03 NOTE — Progress Notes (Signed)
D: " I don't take medicines" A: Pt was encourage to attend groups. Q 15 minute checks were done for safety.   safety maintained on unit.

## 2019-09-03 NOTE — Progress Notes (Signed)
DAR NOTE: Patient presents with anxious affect and depressed mood.  Denies suicidal thoughts, pain, auditory and visual hallucinations.  Rates depression at 0, hopelessness at 0, and anxiety at 0.  Maintained on routine safety checks.  Medications given as prescribed.  Support and encouragement offered as needed.   Patient is withdrawn and isolates to her room.  Patient is safe on and off the unit. Offered no complaint.

## 2019-09-03 NOTE — Progress Notes (Signed)
Recreation Therapy Notes  Date: 10.1.20 Time: 5170-0174 Location: [place]   Group Topic: Leisure Education  Goal Area(s) Addresses:  Patient will identify positive leisure activities.  Patient will identify one positive benefit of participation in leisure activities.   Intervention: Leisure Group Game  Activity: Pictionary.  LRT and patients played a game of Pictionary.  One person would pick a word from the container and draw what the word represents on the board.  The remaining participants get one minute to guess what the picture is.  The person who guesses correctly gets the next turn.  Education:  Leisure Education, Dentist  Education Outcome: Acknowledges education/In group clarification offered/Needs additional education  Clinical Observations/Feedback: Pt did not attend group.     Victorino Sparrow, LRT/CTRS    Ria Comment, Hina Gupta A 09/03/2019 11:13 AM

## 2019-09-04 NOTE — Progress Notes (Addendum)
  Spirituality group facilitated by Simone Curia, MDiv, B[facility].  Group Description:  Group focused on topic of hope.  Patients participated in facilitated discussion around topic, connecting with one another around experiences and definitions for hope.  Group members engaged with visual explorer photos, reflecting on what hope looks like for them today.  Group engaged in discussion around how their definitions of hope are present today in hospital.   Modalities: Psycho-social ed, Adlerian, Narrative, MI Patient Progress: Lisa [name] was present for first part of group. Was initially concerned with chaplain's credentials and process of providing spiritual care at [facility].  Was assured that no patient is required to accept spiritual support.  During group Lisa [name] was able to communicate that she found motivation in justice, naming empathy and compassion for those who have experienced trauma and the value of hearing and believing stories.  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  Lisa [name] took issue with the spirituality group - instead stating she would like to operate from a strengths-based modality.  As chaplain reframed conversation to focus on Lisa [name]'s understanding of her strengths and values in her vocation, she became frustrated and left group.

## 2019-09-04 NOTE — Progress Notes (Signed)
[facility] MD Progress Note  09/04/2019 10:45 AM Lisa [name]  MRN:  [id number] Subjective:   Patient has finally demonstrated some mood stability she has intervals of hypomania and she still has delusional statements but she is no longer volatile/hostile so forth at least so far today  No involuntary movements.  States that in the past she had bizarre side effects from medications that are clearly so fantastical as to be delusional at any rate she is taking all of her medications except she does not recognize the beta-blocker and does not want to take it but she agrees to take all others which is adequate for today  Again showing improvement but not baseline Principal Problem: Presenting with mania/psychosis/volatility requiring IM medications but taking oral meds at this point in time Diagnosis: Active Problems:   Schizoaffective disorder, bipolar type (HCC)  Total Time spent with patient: 20 minutes  Past Psychiatric History: exstensive  Past Medical History:  Past Medical History:  Diagnosis Date  . Anxiety     Past Surgical History:  Procedure Laterality Date  . extraction of wisdom teeth     Family History:  Family History  Problem Relation Age of Onset  . Schizophrenia Mother   . Depression Father   . Seizures Sister    Family Psychiatric  History: see eval Social History:  Social History   Substance and Sexual Activity  Alcohol Use Yes   Comment: casually     Social History   Substance and Sexual Activity  Drug Use No  . Types: Marijuana   Comment: THC last used 2 yrs ago, unsure of amount    Social History   Socioeconomic History  . Marital status: Single    Spouse name: Not on file  . Number of children: Not on file  . Years of education: Not on file  . Highest education level: Not on file  Occupational History  . Not on file  Social Needs  . Financial resource strain: Not on file  . Food insecurity    Worry: Not on file    Inability: Not on file   . Transportation needs    Medical: Not on file    Non-medical: Not on file  Tobacco Use  . Smoking status: Former Smoker    Packs/day: 0.25    Years: 0.50    Pack years: 0.12    Types: Cigarettes    Quit date: 09/03/2015    Years since quitting: 4.0  . Smokeless tobacco: Never Used  Substance and Sexual Activity  . Alcohol use: Yes    Comment: casually  . Drug use: No    Types: Marijuana    Comment: THC last used 2 yrs ago, unsure of amount  . Sexual activity: Not Currently    Comment: Plan B Pill  Lifestyle  . Physical activity    Days per week: Not on file    Minutes per session: Not on file  . Stress: Not on file  Relationships  . Social Musician on phone: Not on file    Gets together: Not on file    Attends religious service: Not on file    Active member of club or organization: Not on file    Attends meetings of clubs or organizations: Not on file    Relationship status: Not on file  Other Topics Concern  . Not on file  Social History Narrative  . Not on file   Additional Social History:  Sleep: Good  Appetite:  Good  Current Medications: Current Facility-Administered Medications  Medication Dose Route Frequency Provider Last Rate Last Dose  . benztropine (COGENTIN) tablet 1 mg  1 mg Oral BID Malvin JohnsFarah, Alvera Tourigny, MD   1 mg at 09/04/19 0830  . carvedilol (COREG) tablet 25 mg  25 mg Oral BID WC Malvin JohnsFarah, Francesco Provencal, MD   25 mg at 09/03/19 1634  . clonazePAM (KLONOPIN) tablet 1 mg  1 mg Oral TID Malvin JohnsFarah, Brynlea Spindler, MD   1 mg at 09/04/19 0831  . levonorgestrel-ethinyl estradiol (SEASONALE) 0.15-0.03 MG per tablet 1 tablet  1 tablet Oral Daily Rankin, Shuvon B, NP      . lithium carbonate (ESKALITH) CR tablet 450 mg  450 mg Oral Q12H Malvin JohnsFarah, Nancye Grumbine, MD   450 mg at 09/04/19 0830  . LORazepam (ATIVAN) tablet 0.5 mg  0.5 mg Oral Q8H PRN Rankin, Shuvon B, NP   0.5 mg at 09/03/19 2103  . omega-3 acid ethyl esters (LOVAZA) capsule 1 g  1 g Oral  BID Malvin JohnsFarah, Arleene Settle, MD   1 g at 09/04/19 0830  . risperiDONE (RISPERDAL) tablet 4 mg  4 mg Oral BID Malvin JohnsFarah, Viliami Bracco, MD   4 mg at 09/04/19 0830  . temazepam (RESTORIL) capsule 30 mg  30 mg Oral QHS Malvin JohnsFarah, Marcianne Ozbun, MD      . ziprasidone (GEODON) injection 20 mg  20 mg Intramuscular Once Malvin JohnsFarah, Malachi Suderman, MD        Lab Results: No results found for this or any previous visit (from the past 48 hour(s)).  Blood Alcohol level:  Lab Results  Component Value Date   ETH <10 08/31/2019   ETH <5 05/11/2015    Metabolic Disorder Labs: Lab Results  Component Value Date   HGBA1C 5.2 05/14/2015   MPG 103 05/14/2015   Lab Results  Component Value Date   PROLACTIN 18.6 06/10/2015   PROLACTIN 77.4 (H) 05/14/2015   Lab Results  Component Value Date   CHOL 143 05/14/2015   TRIG 120 05/14/2015   HDL 42 05/14/2015   CHOLHDL 3.4 05/14/2015   VLDL 24 05/14/2015   LDLCALC 77 05/14/2015    Musculoskeletal: Strength & Muscle Tone: within normal limits Gait & Station: normal Patient leans: N/A  Psychiatric Specialty Exam: Physical Exam  ROS  Blood pressure 120/89, pulse (!) 122.There is no height or weight on file to calculate BMI.  General Appearance: Casual  Eye Contact:  Good  Speech:  Clear and Coherent  Volume:  Increased  Mood:  Hypomanic and still intervals of irritability  Affect:  Congruent  Thought Process:  Irrelevant and Descriptions of Associations: Loose  Orientation:  Full (Time, Place, and Person)  Thought Content:  Illogical and Tangential  Suicidal Thoughts:  No  Homicidal Thoughts:  No  Memory:  Immediate;   Fair Recent;   Fair Remote;   Fair  Judgement:  Fair  Insight:  Fair  Psychomotor Activity:  Normal  Concentration:  Concentration: Fair and Attention Span: Fair  Recall:  [employer]  Fund of Knowledge:  Fair  Language:  Fair  Akathisia:  Negative  Handed:  Right  AIMS (if indicated):     Assets:  Leisure Time Physical Health Resilience Social Support  ADL's:   Intact  Cognition:  WNL  Sleep:  Number of Hours: 7.75     Treatment Plan Summary: Daily contact with patient to assess and evaluate symptoms and progress in treatment and Medication management  Continue current cognitive and reality-based therapies continue to  monitor for safety no change in precautions continue to encourage full med compliance but as long as she takes mood stabilizer and antipsychotic we will make progress.  Monitor through the weekend  [facility], MD 09/04/2019, 10:45 AM

## 2019-09-04 NOTE — Progress Notes (Signed)
D: Pt denies SI/HI/AVH. Pt is controversial with Probation officer. Pt continues to express her displeasure of the IVC process and when writer tries to explain the process pt begins to tell how wrong the process was and how domestic violence cases are not positive for women.   A: Pt was offered support and encouragement. Pt was given scheduled medications. Pt was encourage to attend groups. Q 15 minute checks were done for safety.   R: safety maintained on unit.

## 2019-09-04 NOTE — Progress Notes (Signed)
Recreation Therapy Notes  Date: 10.2.20 Time: 1000 Location: 400 Hall Dayroom   Group Topic: Communication, Team Building, Problem Solving  Goal Area(s) Addresses:  Patient will effectively work with peer towards shared goal.  Patient will identify skill used to make activity successful.  Patient will identify how skills used during activity can be used to reach post d/c goals.   Behavioral Response:  Engaged  Intervention: STEM Activity   Activity: [employer]. Patients were provided the following materials: 5 drinking straws, 5 rubber bands, 5 paper clips, 2 index cards, and 2 drinking cups.  Using the provided materials patients were asked to build a launching mechanisms to launch a ping pong ball approximately 12 feet. Patients were divided into teams of 3-5.   Education: Education officer, community, Dentist.   Education Outcome: Acknowledges education/In group clarification offered/Needs additional education.   Clinical Observations/Feedback: Pt was more engaged and appropriate.  Pt worked well with her peer.  Pt was able to focus and complete activity.  Pt was social with peers and staff.    Victorino Sparrow, LRT/CTRS     Victorino Sparrow A 09/04/2019 11:25 AM

## 2019-09-05 DIAGNOSIS — F600 Paranoid personality disorder: Secondary | ICD-10-CM

## 2019-09-05 MED ORDER — RISPERIDONE 2 MG PO TABS
2.0000 mg | ORAL_TABLET | Freq: Every morning | ORAL | Status: DC
  Administered 2019-09-06 – 2019-09-07 (×2): 2 mg via ORAL
  Filled 2019-09-05 (×6): qty 1

## 2019-09-05 MED ORDER — RISPERIDONE 3 MG PO TABS
6.0000 mg | ORAL_TABLET | Freq: Every day | ORAL | Status: DC
  Administered 2019-09-06 – 2019-09-07 (×2): 6 mg via ORAL
  Filled 2019-09-05 (×4): qty 2

## 2019-09-05 NOTE — Progress Notes (Addendum)
[facility] MD Progress Note  09/05/2019 11:37 AM Lisa [name]  MRN:  [id number] Subjective:  "I'm sleepy."  Lisa [name] is a 28 year old female with history of schizoaffective vs bipolar disorder, who presented under IVC for acute mania. On assessment today she is found asleep in a chair in the dayroom. She appears sedated and reports daytime sleepiness. 5.5 hours of sleep recorded overnight. She is significantly calmer than on admission. She reports that she had felt staff were trying to kill her initially but feels safe around staff now. She continues to exhibit paranoid thoughts- states she was terrorized while abroad in [place] and that people were tracking her and leaving notes for her, and continued to do so after she returned to the [place] She reports smoking marijuana and when she ate a cookie there, she became violently ill because someone was trying to poison her. She states she was driving to visit her sister in [place], and when she pulled over at [employer], she saw a handprint on her car and knew that people were following her. 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 She is not receptive to paranoid thoughts being a psychotic process and states "My diagnosis is false. I am having a natural response to the things happening to me." Per chart review, her father reports that she had been doing well before tapering off psychotropic medications. She states that she does better when she is not taking medications and that medications are "biomedical warfare." She has been med compliant and agreeable to adjust Risperdal dose to HS to minimize daytime sedation. No agitated or disruptive behaviors on the unit. She denies SI/HI/AVH.  Principal Problem: <principal problem not specified> Diagnosis: Active Problems:  Schizoaffective disorder, bipolar type (HCC)  Total Time spent with patient: 20 minutes  Past Psychiatric History: See admission H&P  Past Medical History:  Past Medical History:  Diagnosis Date  . Anxiety     Past Surgical History:  Procedure Laterality Date  . extraction of wisdom teeth     Family History:  Family History  Problem Relation Age of Onset  . Schizophrenia Mother   . Depression Father   . Seizures Sister    Family Psychiatric  History: See admission H&P Social History:  Social History   Substance and Sexual Activity  Alcohol Use Yes   Comment: casually     Social History   Substance and Sexual Activity  Drug Use No  . Types: Marijuana   Comment: THC last used 2 yrs ago, unsure of amount    Social History   Socioeconomic History  . Marital status: Single    Spouse name: Not on file  . Number of children: Not on file  . Years of education: Not on file  . Highest education level: Not on file  Occupational History  . Not on file  Social Needs  . Financial resource strain: Not on file  . Food insecurity    Worry: Not on file    Inability: Not on file  . Transportation needs    Medical: Not on file    Non-medical: Not on file  Tobacco Use  . Smoking status: Former Smoker    Packs/day: 0.25    Years: 0.50    Pack years: 0.12    Types: Cigarettes  Quit date: 09/03/2015    Years since quitting: 4.0  . Smokeless tobacco: Never Used  Substance and Sexual Activity  . Alcohol use: Yes    Comment: casually  . Drug use: No    Types: Marijuana    Comment: THC last used 2 yrs ago, unsure of amount  . Sexual activity: Not Currently    Comment: Plan B Pill  Lifestyle  . Physical activity    Days per week: Not on file    Minutes per session: Not on file  . Stress: Not on file  Relationships  . Social Musicianconnections    Talks on phone: Not on file    Gets together: Not on file    Attends religious service: Not on file    Active member of club  or organization: Not on file    Attends meetings of clubs or organizations: Not on file    Relationship status: Not on file  Other Topics Concern  . Not on file  Social History Narrative  . Not on file   Additional Social History:                         Sleep: Good  Appetite:  Good  Current Medications: Current Facility-Administered Medications  Medication Dose Route Frequency Provider Last Rate Last Dose  . benztropine (COGENTIN) tablet 1 mg  1 mg Oral BID Malvin JohnsFarah, Brian, MD   1 mg at 09/05/19 0756  . carvedilol (COREG) tablet 25 mg  25 mg Oral BID WC Malvin JohnsFarah, Brian, MD   25 mg at 09/05/19 0757  . levonorgestrel-ethinyl estradiol (SEASONALE) 0.15-0.03 MG per tablet 1 tablet  1 tablet Oral Daily Rankin, Shuvon B, NP      . lithium carbonate (ESKALITH) CR tablet 450 mg  450 mg Oral Q12H Malvin JohnsFarah, Brian, MD   450 mg at 09/05/19 0756  . LORazepam (ATIVAN) tablet 0.5 mg  0.5 mg Oral Q8H PRN Rankin, Shuvon B, NP   0.5 mg at 09/03/19 2103  . omega-3 acid ethyl esters (LOVAZA) capsule 1 g  1 g Oral BID Malvin JohnsFarah, Brian, MD   1 g at 09/05/19 0756  . [START ON 09/06/2019] risperiDONE (RISPERDAL) tablet 2 mg  2 mg Oral q morning - 10a Aldean BakerSykes, Janet E, NP      . risperiDONE (RISPERDAL) tablet 4 mg  4 mg Oral BID Aldean BakerSykes, Janet E, NP   4 mg at 09/05/19 0758  . [START ON 09/06/2019] risperiDONE (RISPERDAL) tablet 6 mg  6 mg Oral QHS Marciano SequinSykes, Janet E, NP      . temazepam (RESTORIL) capsule 30 mg  30 mg Oral QHS Malvin JohnsFarah, Brian, MD   30 mg at 09/04/19 2121  . ziprasidone (GEODON) injection 20 mg  20 mg Intramuscular Once Malvin JohnsFarah, Brian, MD        Lab Results: No results found for this or any previous visit (from the past 48 hour(s)).  Blood Alcohol level:  Lab Results  Component Value Date   ETH <10 08/31/2019   ETH <5 05/11/2015    Metabolic Disorder Labs: Lab Results  Component Value Date   HGBA1C 5.2 05/14/2015   MPG 103 05/14/2015   Lab Results  Component Value Date   PROLACTIN 18.6  06/10/2015   PROLACTIN 77.4 (H) 05/14/2015   Lab Results  Component Value Date   CHOL 143 05/14/2015   TRIG 120 05/14/2015   HDL 42 05/14/2015   CHOLHDL 3.4 05/14/2015   VLDL 24  05/14/2015   LDLCALC 77 05/14/2015    Physical Findings: AIMS:  , ,  ,  ,    CIWA:    COWS:     Musculoskeletal: Strength & Muscle Tone: within normal limits Gait & Station: normal Patient leans: N/A  Psychiatric Specialty Exam: Physical Exam  Nursing note and vitals reviewed. Constitutional: She is oriented to person, place, and time. She appears well-developed and well-nourished.  Cardiovascular: Normal rate.  Respiratory: Effort normal.  Neurological: She is alert and oriented to person, place, and time.    Review of Systems  Constitutional: Negative.   Respiratory: Negative for cough and shortness of breath.   Cardiovascular: Negative for chest pain.  Psychiatric/Behavioral: Positive for substance abuse (THC). Negative for depression, hallucinations and suicidal ideas. The patient is not nervous/anxious and does not have insomnia.     Blood pressure 145/90. Heart rate 113. Respirations 16. Temperature 98.2.  General Appearance: Fairly Groomed  Eye Contact:  Good  Speech:  Normal Rate  Volume:  Normal  Mood:  Anxious  Affect:  Congruent  Thought Process:  Disorganized  Orientation:  Full (Time, Place, and Person)  Thought Content:  Delusions and Paranoid Ideation  Suicidal Thoughts:  No  Homicidal Thoughts:  No  Memory:  Immediate;   Fair Recent;   Fair  Judgement:  Fair  Insight:  Lacking  Psychomotor Activity:  Normal  Concentration:  Concentration: Good and Attention Span: Good  Recall:  [employer] of Knowledge:  Fair  Language:  Fair  Akathisia:  No  Handed:  Right  AIMS (if indicated):     Assets:  Communication Skills Desire for Improvement Housing Physical Health Resilience  ADL's:  Intact  Cognition:  WNL  Sleep:  Number of Hours: 5.5     Treatment Plan  Summary: Daily contact with patient to assess and evaluate symptoms and progress in treatment and Medication management   Continue inpatient hospitalization.  Adjust Risperdal schedule- 2 mg PO QAM, 6 mg PO QHS for psychosis/mood instability Continue Cogentin 1 mg PO BID for EPS Continue lithium 450 mg PO BID for mood instability Continue Restoril 30 gm PO QHS for insomnia Continue Lovaza 1 g PO BID for neuroprotection Continue Coreg 25 mg PO BID for HTN Continue Seasonale tablet PO daily for contraception Continue Ativan 0.5 mg PO Q8HR PRN anxiety  Patient will participate in the therapeutic group milieu.  Discharge disposition in progress.   Lisa Burkitt, NP 09/05/2019, 11:37 AM   Attest to NP progress note

## 2019-09-05 NOTE — [facility] Group Notes (Signed)
[place] Group Notes:  (Nursing/MHT/Case Management/Adjunct)  Date:  09/05/2019  Time:  0900   Type of Therapy:  Nurse Education  Participation Level:  Active  Participation Quality:  Appropriate  Affect:  Anxious and Irritable  Cognitive:  Disorganized  Insight:  Lacking  Engagement in Group:  Lacking  Modes of Intervention:  Discussion and Education  Summary of Progress/Problems:  Marissa Calamity 09/05/2019, 9:39 AM

## 2019-09-05 NOTE — Progress Notes (Signed)
Patient ID: Lisa [name], female   DOB: 1991-11-24, 28 y.o.   MRN: [id number]   [place] NOVEL CORONAVIRUS (COVID-19) DAILY CHECK-OFF SYMPTOMS - answer yes or no to each - every day NO YES  Have you had a fever in the past 24 hours?  . Fever (Temp > 37.80C / 100F) X   Have you had any of these symptoms in the past 24 hours? . New Cough .  Sore Throat  .  Shortness of Breath .  Difficulty Breathing .  Unexplained Body Aches   X   Have you had any one of these symptoms in the past 24 hours not related to allergies?   . Runny Nose .  Nasal Congestion .  Sneezing   X   If you have had runny nose, nasal congestion, sneezing in the past 24 hours, has it worsened?  X   EXPOSURES - check yes or no X   Have you traveled outside the state in the past 14 days?  X   Have you been in contact with someone with a confirmed diagnosis of COVID-19 or PUI in the past 14 days without wearing appropriate PPE?  X   Have you been living in the same home as a person with confirmed diagnosis of COVID-19 or a PUI (household contact)?    X   Have you been diagnosed with COVID-19?    X              What to do next: Answered NO to all: Answered YES to anything:   Proceed with unit schedule Follow the [facility] Inpatient Flowsheet.

## 2019-09-05 NOTE — Progress Notes (Signed)
Adult Psychoeducational Group Note  Date:  09/05/2019 Time:  1:34 AM  Group Topic/Focus:  Wrap-Up Group:   The focus of this group is to help patients review their daily goal of treatment and discuss progress on daily workbooks.  Participation Level:  Active  Participation Quality:  Appropriate  Affect:  Appropriate  Cognitive:  Appropriate  Insight: Appropriate  Engagement in Group:  Developing/Improving  Modes of Intervention:  Discussion  Additional Comments: Pt stated her goal for today was to focus on her treatment plan. Pt stated she felt she accomplished her goal today. Pt stated her relationship with her family has improved since she was admitted here. Pt stated that her mother coming for visitation help improve her day. Pt stated she felt better about herself today. Pt rated her overall day an 8 out of 10. Pt stated her appetite was pretty good today. Pt stated her goal for tonight is to get some rest. Pt did not complain of any pain tonight. Pt stated she was not hearing or seeing anything that was not there. Pt stated she had no thoughts of harming herself or others. Pt stated she would alert staff if anything changes.   Candy Sledge 09/05/2019, 1:34 AM

## 2019-09-05 NOTE — Plan of Care (Signed)
  Problem: Education: Goal: Emotional status will improve Outcome: Progressing Goal: Verbalization of understanding the information provided will improve Outcome: Progressing   Problem: Activity: Goal: Interest or engagement in activities will improve Outcome: Progressing   Problem: Safety: Goal: Periods of time without injury will increase Outcome: Progressing

## 2019-09-05 NOTE — [facility] Group Notes (Signed)
Group Therapy was deferred for outside time,  Selmer Dominion, LCSW 09/05/2019, 4:39 PM

## 2019-09-06 NOTE — Progress Notes (Signed)
Patient has her mother to visit tonight. Patient was in the dayroom briefly before returning to her room. She refused her Restoril and paced around in her room most of the night. Safety maintained with 15 min checks,.

## 2019-09-06 NOTE — [facility] Group Notes (Signed)
[place] LCSW Group Therapy Note  Date/Time:  09/06/2019  9:30-10:30am  Type of Therapy and Topic:  Group Therapy:  Music and Mood  Participation Level:  Did Not Attend   Description of Group: In this process group, members listened to a variety of genres of music and identified that different types of music evoke different responses.  Patients were encouraged to identify music that was soothing for them and music that was energizing for them.  Patients discussed how this knowledge can help with wellness and recovery in various ways including managing depression and anxiety as well as encouraging healthy sleep habits.    Therapeutic Goals: 1. Patients will explore the impact of different varieties of music on mood 2. Patients will verbalize the thoughts they have when listening to different types of music 3. Patients will identify music that is soothing to them as well as music that is energizing to them 4. Patients will discuss how to use this knowledge to assist in maintaining wellness and recovery 5. Patients will explore the use of music as a coping skill  Summary of Patient Progress:  PATIENT WAS PRESENT AT THE BEGINNING OF GROUP AND LEFT STATING SHE WAS ANXIOUS.  Therapeutic Modalities: Solution Focused Brief Therapy Activity   Selmer Dominion, LCSW

## 2019-09-06 NOTE — Progress Notes (Addendum)
[facility] MD Progress Note  09/06/2019 2:40 PM Lisa [name]  MRN:  [id number] Subjective:  "I'm doing fine."  Lisa [name] is a 28 year old female with history of schizoaffective vs bipolar disorder, who presented under IVC for acute mania. On assessment today she is found socializing in the dayroom. She appears more awake and alert with decreased daytime dose of Risperdal. She has been calm and cooperative. She does report difficulty sleeping last night but states this was related to noise from maintenance being done on the hallway. She continues to state that she does not feel she needs medication, but she is agreeable to follow up at [facility] with her psychiatrist there. She denies any SI/HI/AVH. She shows no signs of responding to internal stimuli. No delusional thought content expressed.   Principal Problem: <principal problem not specified> Diagnosis: Active Problems:   Schizoaffective disorder, bipolar type ([place])  Total Time spent with patient: 15 minutes  Past Psychiatric History: See admission H&P  Past Medical History:  Past Medical History:  Diagnosis Date  . Anxiety     Past Surgical History:  Procedure Laterality Date  . extraction of wisdom teeth     Family History:  Family History  Problem Relation Age of Onset  . Schizophrenia Mother   . Depression Father   . Seizures Sister    Family Psychiatric  History: See admission H&P Social History:  Social History   Substance and Sexual Activity  Alcohol Use Yes   Comment: casually     Social History   Substance and Sexual Activity  Drug Use No  . Types: Marijuana   Comment: THC last used 2 yrs ago, unsure of amount    Social History   Socioeconomic History  . Marital status: Single    Spouse name: Not on file  . Number of children: Not on file  . Years of education: Not on file  . Highest education level: Not on file  Occupational History  . Not on file  Social Needs  . Financial resource strain: Not on  file  . Food insecurity    Worry: Not on file    Inability: Not on file  . Transportation needs    Medical: Not on file    Non-medical: Not on file  Tobacco Use  . Smoking status: Former Smoker    Packs/day: 0.25    Years: 0.50    Pack years: 0.12    Types: Cigarettes    Quit date: 09/03/2015    Years since quitting: 4.0  . Smokeless tobacco: Never Used  Substance and Sexual Activity  . Alcohol use: Yes    Comment: casually  . Drug use: No    Types: Marijuana    Comment: THC last used 2 yrs ago, unsure of amount  . Sexual activity: Not Currently    Comment: Plan B Pill  Lifestyle  . Physical activity    Days per week: Not on file    Minutes per session: Not on file  . Stress: Not on file  Relationships  . Social Herbalist on phone: Not on file    Gets together: Not on file    Attends religious service: Not on file    Active member of club or organization: Not on file    Attends meetings of clubs or organizations: Not on file    Relationship status: Not on file  Other Topics Concern  . Not on file  Social History Narrative  . Not  on file   Additional Social History:                         Sleep: Good  Appetite:  Good  Current Medications: Current Facility-Administered Medications  Medication Dose Route Frequency Provider Last Rate Last Dose  . benztropine (COGENTIN) tablet 1 mg  1 mg Oral BID Malvin Johns, MD   1 mg at 09/06/19 0748  . carvedilol (COREG) tablet 25 mg  25 mg Oral BID WC Malvin Johns, MD   25 mg at 09/06/19 0748  . levonorgestrel-ethinyl estradiol (SEASONALE) 0.15-0.03 MG per tablet 1 tablet  1 tablet Oral Daily Rankin, Shuvon B, NP      . lithium carbonate (ESKALITH) CR tablet 450 mg  450 mg Oral Q12H Malvin Johns, MD   450 mg at 09/06/19 0748  . LORazepam (ATIVAN) tablet 0.5 mg  0.5 mg Oral Q8H PRN Rankin, Shuvon B, NP   0.5 mg at 09/06/19 1207  . omega-3 acid ethyl esters (LOVAZA) capsule 1 g  1 g Oral BID Malvin Johns, MD    1 g at 09/06/19 0748  . risperiDONE (RISPERDAL) tablet 2 mg  2 mg Oral q morning - 10a Aldean Baker, NP   2 mg at 09/06/19 1030  . risperiDONE (RISPERDAL) tablet 6 mg  6 mg Oral QHS Marciano Sequin E, NP      . temazepam (RESTORIL) capsule 30 mg  30 mg Oral QHS Malvin Johns, MD   30 mg at 09/04/19 2121  . ziprasidone (GEODON) injection 20 mg  20 mg Intramuscular Once Malvin Johns, MD        Lab Results: No results found for this or any previous visit (from the past 48 hour(s)).  Blood Alcohol level:  Lab Results  Component Value Date   ETH <10 08/31/2019   ETH <5 05/11/2015    Metabolic Disorder Labs: Lab Results  Component Value Date   HGBA1C 5.2 05/14/2015   MPG 103 05/14/2015   Lab Results  Component Value Date   PROLACTIN 18.6 06/10/2015   PROLACTIN 77.4 (H) 05/14/2015   Lab Results  Component Value Date   CHOL 143 05/14/2015   TRIG 120 05/14/2015   HDL 42 05/14/2015   CHOLHDL 3.4 05/14/2015   VLDL 24 05/14/2015   LDLCALC 77 05/14/2015    Physical Findings: AIMS:  , ,  ,  ,    CIWA:    COWS:     Musculoskeletal: Strength & Muscle Tone: within normal limits Gait & Station: normal Patient leans: N/A  Psychiatric Specialty Exam: Physical Exam  Nursing note and vitals reviewed. Constitutional: She is oriented to person, place, and time. She appears well-developed and well-nourished.  Cardiovascular: Normal rate.  Respiratory: Effort normal.  Neurological: She is alert and oriented to person, place, and time.    Review of Systems  Constitutional: Negative.   Respiratory: Negative for cough and shortness of breath.   Cardiovascular: Negative for chest pain.  Psychiatric/Behavioral: Negative for depression, hallucinations, substance abuse and suicidal ideas. The patient is not nervous/anxious and does not have insomnia.     Blood pressure 115/83, pulse (!) 107.There is no height or weight on file to calculate BMI.  General Appearance: Casual  Eye Contact:   Good  Speech:  Normal Rate  Volume:  Normal  Mood:  Euthymic  Affect:  Appropriate and Congruent  Thought Process:  Coherent  Orientation:  Full (Time, Place, and Person)  Thought Content:  Logical  Suicidal Thoughts:  No  Homicidal Thoughts:  No  Memory:  Immediate;   Fair Recent;   Fair  Judgement:  Intact  Insight:  Limited  Psychomotor Activity:  Normal  Concentration:  Concentration: Fair and Attention Span: Fair  Recall:  [employer]  Fund of Knowledge:  Good  Language:  Good  Akathisia:  No  Handed:  Right  AIMS (if indicated):     Assets:  Communication Skills Desire for Improvement Financial Resources/Insurance Housing Resilience Vocational/Educational  ADL's:  Intact  Cognition:  WNL  Sleep:  Number of Hours: 4.25     Treatment Plan Summary: Daily contact with patient to assess and evaluate symptoms and progress in treatment and Medication management  Continue inpatient hospitalization. Check lithium level.  Continue Risperdal 2 mg PO QAM, 6 mg PO QHS for psychosis/mood instability Continue Cogentin 1 mg PO BID for EPS Continue lithium 450 mg PO BID for mood instability Continue Restoril 30 gm PO QHS for insomnia Continue Lovaza 1 g PO BID for neuroprotection Continue Coreg 25 mg PO BID for HTN Continue Seasonale tablet PO daily for contraception Continue Ativan 0.5 mg PO Q8HR PRN anxiety  Patient will participate in the therapeutic group milieu.  Discharge disposition in progress.   Aldean BakerJanet E Sykes, NP 09/06/2019, 2:40 PM   Attest to NP progress note

## 2019-09-06 NOTE — Plan of Care (Signed)
  Problem: Education: Goal: Verbalization of understanding the information provided will improve Outcome: Progressing   Problem: Activity: Goal: Interest or engagement in activities will improve Outcome: Progressing   Problem: Coping: Goal: Ability to demonstrate self-control will improve Outcome: Progressing   Problem: Safety: Goal: Periods of time without injury will increase Outcome: Progressing

## 2019-09-06 NOTE — [facility] Group Notes (Signed)
[place] Group Notes:  (Nursing/MHT/Case Management/Adjunct)  Date:  09/06/2019  Time:  0900  Type of Therapy:  Nurse Education  Participation Level:  Minimal  Participation Quality:  Inattentive  Affect:  Irritable  Cognitive:  Disorganized and Lacking  Insight:  Lacking  Engagement in Group:  None  Modes of Intervention:  Discussion and Education  Summary of Progress/Problems:  Lisa [name] 09/06/2019,

## 2019-09-06 NOTE — Progress Notes (Signed)
Adult Psychoeducational Group Note  Date:  09/06/2019 Time:  2:14 AM  Group Topic/Focus:  Wrap-Up Group:   The focus of this group is to help patients review their daily goal of treatment and discuss progress on daily workbooks.  Participation Level:  Active  Participation Quality:  Appropriate  Affect:  Appropriate  Cognitive:  Appropriate  Insight: Appropriate  Engagement in Group:  Developing/Improving  Modes of Intervention:  Discussion  Additional Comments:  Pt stated her goal for today was to focus on her treatment plan. Pt stated she felt she accomplished her goal today. Pt stated her relationship with her family has improved since she was admitted here. Pt stated her mother coming by visitation today help improve her overall day. Pt stated she felt better about herself today. Pt rated her overall day a 10. Pt stated her appetite was pretty good today. Pt stated her goal for tonight is to get some rest. Pt did not complain of any pain tonight. Pt stated she was not hearing or seeing anything that was not there. Pt stated she had no thoughts of harming herself or others. Pt stated she would alert staff if anything changes.  Lisa [name] 09/06/2019, 2:14 AM

## 2019-09-06 NOTE — Progress Notes (Signed)
Patient ID: Lisa [name], female   DOB: 01/19/1991, 27 y.o.   MRN: [id number]   [place] NOVEL CORONAVIRUS (COVID-19) DAILY CHECK-OFF SYMPTOMS - answer yes or no to each - every day NO YES  Have you had a fever in the past 24 hours?  . Fever (Temp > 37.80C / 100F) X   Have you had any of these symptoms in the past 24 hours? . New Cough .  Sore Throat  .  Shortness of Breath .  Difficulty Breathing .  Unexplained Body Aches   X   Have you had any one of these symptoms in the past 24 hours not related to allergies?   . Runny Nose .  Nasal Congestion .  Sneezing   X   If you have had runny nose, nasal congestion, sneezing in the past 24 hours, has it worsened?  X   EXPOSURES - check yes or no X   Have you traveled outside the state in the past 14 days?  X   Have you been in contact with someone with a confirmed diagnosis of COVID-19 or PUI in the past 14 days without wearing appropriate PPE?  X   Have you been living in the same home as a person with confirmed diagnosis of COVID-19 or a PUI (household contact)?    X   Have you been diagnosed with COVID-19?    X              What to do next: Answered NO to all: Answered YES to anything:   Proceed with unit schedule Follow the [facility] Inpatient Flowsheet.   

## 2019-09-07 LAB — CBC
HCT: 46.3 % — ABNORMAL HIGH (ref 36.0–46.0)
Hemoglobin: 15.3 g/dL — ABNORMAL HIGH (ref 12.0–15.0)
MCH: 31 pg (ref 26.0–34.0)
MCHC: 33 g/dL (ref 30.0–36.0)
MCV: 93.7 fL (ref 80.0–100.0)
Platelets: 305 10*3/uL (ref 150–400)
RBC: 4.94 MIL/uL (ref 3.87–5.11)
RDW: 12.1 % (ref 11.5–15.5)
WBC: 8.4 10*3/uL (ref 4.0–10.5)
nRBC: 0 % (ref 0.0–0.2)

## 2019-09-07 LAB — LITHIUM LEVEL: Lithium Lvl: 0.63 mmol/L (ref 0.60–1.20)

## 2019-09-07 MED ORDER — ARIPIPRAZOLE ER 400 MG IM SRER
400.0000 mg | INTRAMUSCULAR | Status: DC
  Administered 2019-09-07: 17:00:00 400 mg via INTRAMUSCULAR
  Filled 2019-09-07: qty 2

## 2019-09-07 NOTE — Progress Notes (Signed)
Adult Psychoeducational Group Note  Date:  09/07/2019 Time:  11:10 PM  Group Topic/Focus:  Wrap-Up Group:   The focus of this group is to help patients review their daily goal of treatment and discuss progress on daily workbooks.  Participation Level:  Active  Participation Quality:  Appropriate  Affect:  Appropriate  Cognitive:  Appropriate  Insight: Appropriate  Engagement in Group:  Developing/Improving  Modes of Intervention:  Discussion  Additional Comments:  Pt stated her goal for today was to talk with her doctor about her discharge plan. Pt stated she felt she accomplished her goal today. Pt stated she is hoping to be discharge tomorrow.  Pt stated her relationship with her family has improved since she was admitted here. Pt stated she felt better about herself today. Pt rated her overall day an 8 out of 10. Pt stated her appetite was pretty good today. Pt stated her goal for tonight was to get some rest. Pt did not complain of pain tonight. Pt stated she was not hearing or seeing anything that was not there. Pt stated she had no thoughts of harming herself or others. Pt stated she would alert staff if anything changes.  Candy Sledge 09/07/2019, 11:10 PM

## 2019-09-07 NOTE — Progress Notes (Signed)
Adult Psychoeducational Group Note  Date:  09/07/2019 Time:  12:06 AM  Group Topic/Focus:  Wrap-Up Group:   The focus of this group is to help patients review their daily goal of treatment and discuss progress on daily workbooks.  Participation Level:  Active  Participation Quality:  Appropriate  Affect:  Appropriate  Cognitive:  Appropriate  Insight: Appropriate  Engagement in Group:  Developing/Improving  Modes of Intervention:  Discussion  Additional Comments:  Pt stated her goal for today was to focus on her treatment plan.  Pt stated she accomplished her goal today. Pt stated her relationship with her family has improved since she was admitted. Pt stated her mother coming for visitation help improve her day. Pt stated she felt better about herself today. Pt rated her overall day a 9 out of 10. Pt stated her appetite was pretty good today. Pt stated her goal for the night was to get some rest. Pt did not complain of any pain tonight. Pt stated she was not hearing or seeing anything that was not there. Pt stated she had no thoughts of harming herself or others. Pt stated if anything change she would alert staff.  Candy Sledge 09/07/2019, 12:06 AM

## 2019-09-07 NOTE — Progress Notes (Signed)
Patient has been observed up in the dayroom with peers watching the football game. She was informed of her hs medications and she refused the Restoril reporting that she will not need sleep medicine but  was compliant with her risperdal. Safety maintained with 15 min checks

## 2019-09-07 NOTE — Progress Notes (Signed)
[facility]  09/07/2019 1:19 PM Lisa AbedCorina J [name]  MRN:  [id number] Subjective:     Principal Problem: <principal problem not specified> Diagnosis: Active Problems:   Schizoaffective disorder, bipolar type ([facility])  Total Time spent with patient: 20 minutes  Past Psychiatric History: see eval  Past Medical History:  Past Medical History:  Diagnosis Date  . Anxiety     Past Surgical History:  Procedure Laterality Date  . extraction of wisdom teeth     Family History:  Family History  Problem Relation Age of Onset  . Schizophrenia Mother   . Depression Father   . Seizures Sister    Family Psychiatric  History: no new data Social History:  Social History   Substance and Sexual Activity  Alcohol Use Yes   Comment: casually     Social History   Substance and Sexual Activity  Drug Use No  . Types: Marijuana   Comment: THC last used 2 yrs ago, unsure of amount    Social History   Socioeconomic History  . Marital status: Single    Spouse name: Not on file  . Number of children: Not on file  . Years of education: Not on file  . Highest education level: Not on file  Occupational History  . Not on file  Social Needs  . Financial resource strain: Not on file  . Food insecurity    Worry: Not on file    Inability: Not on file  . Transportation needs    Medical: Not on file    Non-medical: Not on file  Tobacco Use  . Smoking status: Former Smoker    Packs/day: 0.25    Years: 0.50    Pack years: 0.12    Types: Cigarettes    Quit date: 09/03/2015    Years since quitting: 4.0  . Smokeless tobacco: Never Used  Substance and Sexual Activity  . Alcohol use: Yes    Comment: casually  . Drug use: No    Types: Marijuana    Comment: THC last used 2 yrs ago, unsure of amount  . Sexual activity: Not Currently    Comment: Plan B Pill  Lifestyle  . Physical activity    Days per week: Not on file    Minutes per session: Not on file  . Stress: Not on file   Relationships  . Social Musicianconnections    Talks on phone: Not on file    Gets together: Not on file    Attends religious service: Not on file    Active member of club or organization: Not on file    Attends meetings of clubs or organizations: Not on file    Relationship status: Not on file  Other Topics Concern  . Not on file  Social History Narrative  . Not on file   Additional Social History:                         Sleep: Good  Appetite:  Good  Current Medications: Current Facility-Administered Medications  Medication Dose Route Frequency Provider Last Rate Last Dose  . benztropine (COGENTIN) tablet 1 mg  1 mg Oral BID Malvin JohnsFarah, Asmar Brozek, MD   1 mg at 09/07/19 [id number]  . carvedilol (COREG) tablet 25 mg  25 mg Oral BID WC Malvin JohnsFarah, Santiana Glidden, MD   25 mg at 09/07/19 [id number]  . levonorgestrel-ethinyl estradiol (SEASONALE) 0.15-0.03 MG per tablet 1 tablet  1 tablet Oral Daily Rankin, Shuvon B,  NP      . lithium carbonate (ESKALITH) CR tablet 450 mg  450 mg Oral Q12H Malvin Johns, MD   450 mg at 09/07/19 4098  . LORazepam (ATIVAN) tablet 0.5 mg  0.5 mg Oral Q8H PRN Rankin, Shuvon B, NP   0.5 mg at 09/06/19 1207  . omega-3 acid ethyl esters (LOVAZA) capsule 1 g  1 g Oral BID Malvin Johns, MD   1 g at 09/07/19 1191  . risperiDONE (RISPERDAL) tablet 2 mg  2 mg Oral q morning - 10a Aldean Baker, NP   2 mg at 09/07/19 4782  . risperiDONE (RISPERDAL) tablet 6 mg  6 mg Oral QHS Aldean Baker, NP   6 mg at 09/06/19 2109  . temazepam (RESTORIL) capsule 30 mg  30 mg Oral QHS Malvin Johns, MD   30 mg at 09/04/19 2121  . ziprasidone (GEODON) injection 20 mg  20 mg Intramuscular Once Malvin Johns, MD        Lab Results:  Results for orders placed or performed during the hospital encounter of 08/31/19 (from the past 48 hour(s))  Lithium level     Status: None   Collection Time: 09/07/19  6:36 AM  Result Value Ref Range   Lithium Lvl 0.63 0.60 - 1.20 mmol/L    Comment: Performed at [facility], [address], [place], [place] [address]  CBC     Status: Abnormal   Collection Time: 09/07/19  6:36 AM  Result Value Ref Range   WBC 8.4 4.0 - 10.5 K/uL   RBC 4.94 3.87 - 5.11 MIL/uL   Hemoglobin 15.3 (H) 12.0 - 15.0 g/dL   HCT 30.8 (H) 65.7 - 84.6 %   MCV 93.7 80.0 - 100.0 fL   MCH 31.0 26.0 - 34.0 pg   MCHC 33.0 30.0 - 36.0 g/dL   RDW 96.2 95.2 - 84.1 %   Platelets 305 150 - 400 K/uL   nRBC 0.0 0.0 - 0.2 %    Comment: Performed at [facility], [address], [place], [place] [address]    Blood Alcohol level:  Lab Results  Component Value Date   ETH <10 08/31/2019   ETH <5 05/11/2015    Metabolic Disorder Labs: Lab Results  Component Value Date   HGBA1C 5.2 05/14/2015   MPG 103 05/14/2015   Lab Results  Component Value Date   PROLACTIN 18.6 06/10/2015   PROLACTIN 77.4 (H) 05/14/2015   Lab Results  Component Value Date   CHOL 143 05/14/2015   TRIG 120 05/14/2015   HDL 42 05/14/2015   CHOLHDL 3.4 05/14/2015   VLDL 24 05/14/2015   LDLCALC 77 05/14/2015    Physical Findings: AIMS:  , ,  ,  ,    CIWA:    COWS:     Musculoskeletal: Strength & Muscle Tone: within normal limits Gait & Station: normal Patient leans: N/A  Psychiatric Specialty Exam: Physical Exam  ROS  Blood pressure 122/85, pulse (!) 116.There is no height or weight on file to calculate BMI.  General Appearance: Casual  Eye Contact:  Good  Speech:  Clear and Coherent  Volume:  Normal  Mood:  Euthymic  Affect:  Congruent  Thought Process:  Coherent and Linear  Orientation:  Full (Time, Place, and Person)  Thought Content:  Logical and Tangential  Suicidal Thoughts:  No  Homicidal Thoughts:  No  Memory:  Immediate;   Fair Recent;   Fair better at remote  Judgement:  Fair  Insight:  Fair  Psychomotor Activity:  Normal  Concentration:  Concentration: Fair and Attention Span: Fair  Recall:  [employer] of Knowledge:  Fair  Language:  Fair   Akathisia:  Negative  Handed:  Right  AIMS (if indicated):     Assets:  Leisure Time Physical Health  ADL's:  Intact  Cognition:  WNL  Sleep:  Number of Hours: 4.25     Treatment Plan Summary: Daily contact with patient to assess and evaluate symptoms and progress in treatment and Medication management  Patient showing much improvement she is contained in mood and struggling very hard to maintain her mood status we believe however she has a history of noncompliance and we have discussed long-acting injectable we will revisit this hopefully her outpatient clinician will help was also convince her we will reach out to him.  We will plan on discharge tomorrow spoke with her father continue current precautions  Khyle Goodell, MD 09/07/2019, 1:19 PM

## 2019-09-07 NOTE — Tx Team (Signed)
Interdisciplinary Treatment and Diagnostic Plan Update  09/07/2019 Time of Session: 10:40am Lisa [name] MRN: [id number]  Principal Diagnosis: <principal problem not specified>  Secondary Diagnoses: Active Problems:   Schizoaffective disorder, bipolar type (HCC)   Current Medications:  Current Facility-Administered Medications  Medication Dose Route Frequency Provider Last Rate Last Dose  . ARIPiprazole ER (ABILIFY MAINTENA) injection 400 mg  400 mg Intramuscular Q28 days [name], MD      . benztropine (COGENTIN) tablet 1 mg  1 mg Oral BID [name], MD   1 mg at 09/07/19 6967  . carvedilol (COREG) tablet 25 mg  25 mg Oral BID WC [name], MD   25 mg at 09/07/19 8938  . levonorgestrel-ethinyl estradiol (SEASONALE) 0.15-0.03 MG per tablet 1 tablet  1 tablet Oral Daily Rankin, Shuvon B, NP      . lithium carbonate (ESKALITH) CR tablet 450 mg  450 mg Oral Q12H [name], MD   450 mg at 09/07/19 1017  . LORazepam (ATIVAN) tablet 0.5 mg  0.5 mg Oral Q8H PRN Rankin, Shuvon B, NP   0.5 mg at 09/06/19 1207  . omega-3 acid ethyl esters (LOVAZA) capsule 1 g  1 g Oral BID [name], MD   1 g at 09/07/19 5102  . risperiDONE (RISPERDAL) tablet 2 mg  2 mg Oral q morning - 10a [name], NP   2 mg at 09/07/19 5852  . risperiDONE (RISPERDAL) tablet 6 mg  6 mg Oral QHS [name], NP   6 mg at 09/06/19 2109  . temazepam (RESTORIL) capsule 30 mg  30 mg Oral QHS [name], MD   30 mg at 09/04/19 2121  . ziprasidone (GEODON) injection 20 mg  20 mg Intramuscular Once [name], MD       PTA Medications: Medications Prior to Admission  Medication Sig Dispense Refill Last Dose  . levonorgestrel-ethinyl estradiol (SEASONALE) 0.15-0.03 MG tablet Take 1 tablet by mouth daily.     [name] LORazepam (ATIVAN) 0.5 MG tablet Take 0.5 mg by mouth every 8 (eight) hours as needed for anxiety.       Patient Stressors:    Patient Strengths:    Treatment Modalities: Medication  Management, Group therapy, Case management,  1 to 1 session with clinician, Psychoeducation, Recreational therapy.   Physician Treatment Plan for Primary Diagnosis: <principal problem not specified> Long Term Goal(s): Improvement in symptoms so as ready for discharge Improvement in symptoms so as ready for discharge   Short Term Goals: Ability to verbalize feelings will improve Ability to demonstrate self-control will improve Ability to identify and develop effective coping behaviors will improve Ability to maintain clinical measurements within normal limits will improve Ability to disclose and discuss suicidal ideas Ability to demonstrate self-control will improve Ability to identify and develop effective coping behaviors will improve Ability to maintain clinical measurements within normal limits will improve  Medication Management: Evaluate patient's response, side effects, and tolerance of medication regimen.  Therapeutic Interventions: 1 to 1 sessions, Unit Group sessions and Medication administration.  Evaluation of Outcomes: Progressing  Physician Treatment Plan for Secondary Diagnosis: Active Problems:   Schizoaffective disorder, bipolar type ([place])  Long Term Goal(s): Improvement in symptoms so as ready for discharge Improvement in symptoms so as ready for discharge   Short Term Goals: Ability to verbalize feelings will improve Ability to demonstrate self-control will improve Ability to identify and develop effective coping behaviors will improve Ability to maintain clinical measurements within normal limits will improve  Ability to disclose and discuss suicidal ideas Ability to demonstrate self-control will improve Ability to identify and develop effective coping behaviors will improve Ability to maintain clinical measurements within normal limits will improve     Medication Management: Evaluate patient's response, side effects, and tolerance of medication  regimen.  Therapeutic Interventions: 1 to 1 sessions, Unit Group sessions and Medication administration.  Evaluation of Outcomes: Progressing   RN Treatment Plan for Primary Diagnosis: <principal problem not specified> Long Term Goal(s): Knowledge of disease and therapeutic regimen to maintain health will improve  Short Term Goals: Ability to participate in decision making will improve, Ability to verbalize feelings will improve, Ability to disclose and discuss suicidal ideas, Ability to identify and develop effective coping behaviors will improve and Compliance with prescribed medications will improve  Medication Management: RN will administer medications as ordered by provider, will assess and evaluate patient's response and provide education to patient for prescribed medication. RN will report any adverse and/or side effects to prescribing provider.  Therapeutic Interventions: 1 on 1 counseling sessions, Psychoeducation, Medication administration, Evaluate responses to treatment, Monitor vital signs and CBGs as ordered, Perform/monitor CIWA, COWS, AIMS and Fall Risk screenings as ordered, Perform wound care treatments as ordered.  Evaluation of Outcomes: Progressing   LCSW Treatment Plan for Primary Diagnosis: <principal problem not specified> Long Term Goal(s): Safe transition to appropriate next level of care at discharge, Engage patient in therapeutic group addressing interpersonal concerns.  Short Term Goals: Engage patient in aftercare planning with referrals and resources and Increase skills for wellness and recovery  Therapeutic Interventions: Assess for all discharge needs, 1 to 1 time with Social worker, Explore available resources and support systems, Assess for adequacy in community support network, Educate family and significant other(s) on suicide prevention, Complete Psychosocial Assessment, Interpersonal group therapy.  Evaluation of Outcomes: Progressing   Progress in  Treatment: Attending groups: Yes. Participating in groups: Yes. Taking medication as prescribed: Yes. Toleration medication: Yes. Family/Significant other contact made: Yes, individual(s) contacted:  with pt; pt declined Patient understands diagnosis: Yes. Discussing patient identified problems/goals with staff: Yes. Medical problems stabilized or resolved: No. Denies suicidal/homicidal ideation: Yes. Issues/concerns per patient self-inventory: No. Other:   New problem(s) identified: No, Describe:  None  New Short Term/Long Term Goal(s): Medication stabilization, elimination of SI thoughts, and development of a comprehensive mental wellness plan.   Patient Goals:  Patient did not state a goal.  Discharge Plan or Barriers: Patient will be discharged home and follow up with therapy and medication management   Reason for Continuation of Hospitalization: Medication stabilization  Estimated Length of Stay: 1-2 days   Attendees: Patient:  09/07/2019   Physician: Dr. Malvin Johns, MD 09/07/2019   Nursing: Casimiro Needle, RN 09/07/2019   RN Care Manager: 09/07/2019   Social Worker: Stephannie Peters, LCSW  09/07/2019   Recreational Therapist:  09/07/2019   Other:  09/07/2019   Other:  09/07/2019   Other: 09/07/2019     Scribe for Treatment Team: Delphia Grates, LCSW 09/07/2019 2:29 PM

## 2019-09-07 NOTE — Progress Notes (Signed)
Recreation Therapy Notes  Date: 10.5.20 Time: 1000 Location: [address] Dayroom  Group Topic: Wellness  Goal Area(s) Addresses:  Patient will define components of whole wellness. Patient will verbalize benefit of whole wellness.  Behavioral Response: Engaged  Intervention: Exercise, Music  Activity:  Exercises.  LRT introduced activity to patients.  LRT encouraged patients to do as much as possible without straining themselves, take breaks if needed and drink water when needed.  Education: Wellness, Dentist.   Education Outcome: Acknowledges education/In group clarification offered/Needs additional education.   Clinical Observations/Feedback:  Pt was appropriate and focused during group session.  Pt helped to co lead the group and demonstrated alternative ways to complete exercises for peers.  Pt was pleasant throughout group session.    Lisa [name], LRT/CTRS     Ria Comment, Mystic Labo A 09/07/2019 11:04 AM

## 2019-09-07 NOTE — [facility] Group Notes (Signed)
[facility] LCSW Group Therapy Note  Date/Time: 09/07/2019 @ 1:30pm  Type of Therapy and Topic:  Group Therapy:  Overcoming Obstacles  Participation Level:  [facility] PARTICIPATION LEVEL: Active  Description of Group:    In this group patients will be encouraged to explore what they see as obstacles to their own wellness and recovery. They will be guided to discuss their thoughts, feelings, and behaviors related to these obstacles. The group will process together ways to cope with barriers, with attention given to specific choices patients can make. Each patient will be challenged to identify changes they are motivated to make in order to overcome their obstacles. This group will be process-oriented, with patients participating in exploration of their own experiences as well as giving and receiving support and challenge from other group members.  Therapeutic Goals: 1. Patient will identify personal and current obstacles as they relate to admission. 2. Patient will identify barriers that currently interfere with their wellness or overcoming obstacles.  3. Patient will identify feelings, thought process and behaviors related to these barriers. 4. Patient will identify two changes they are willing to make to overcome these obstacles:    Summary of Patient Progress  Patient was active and engaged throughout group therapy today. Patient was able to identify her current obstacle as logistics of her discharge tomorrow. Patient stated that she does not have an barriers because she already has changes/a plan that she is willing to make to overcome her obstacle which is obtaining supports from her parents and having security/safety when she does discharge.     Therapeutic Modalities:   Cognitive Behavioral Therapy Solution Focused Therapy Motivational Interviewing Relapse Prevention Therapy   Ardelle Anton, LCSW

## 2019-09-08 MED ORDER — ARIPIPRAZOLE ER 400 MG IM SRER: 400.0000 mg | INTRAMUSCULAR | 11 refills | Status: DC

## 2019-09-08 MED ORDER — BENZTROPINE MESYLATE 1 MG PO TABS: 1.0000 mg | ORAL_TABLET | Freq: Two times a day (BID) | ORAL | 2 refills | Status: DC

## 2019-09-08 MED ORDER — RISPERIDONE 4 MG PO TABS: 8.0000 mg | ORAL_TABLET | Freq: Every day | ORAL | 2 refills | Status: DC

## 2019-09-08 MED ORDER — TEMAZEPAM 30 MG PO CAPS: 30.0000 mg | ORAL_CAPSULE | Freq: Every day | ORAL | 0 refills | Status: DC

## 2019-09-08 MED ORDER — LITHIUM CARBONATE ER 450 MG PO TBCR: 450.0000 mg | EXTENDED_RELEASE_TABLET | Freq: Two times a day (BID) | ORAL | 2 refills | Status: DC

## 2019-09-08 MED ORDER — CARVEDILOL 12.5 MG PO TABS: 12.5000 mg | ORAL_TABLET | Freq: Two times a day (BID) | ORAL | 2 refills | Status: DC

## 2019-09-08 MED ORDER — OMEGA-3-ACID ETHYL ESTERS 1 G PO CAPS: 1.0000 g | ORAL_CAPSULE | Freq: Two times a day (BID) | ORAL | 11 refills | Status: DC

## 2019-09-08 NOTE — Progress Notes (Signed)
Recreation Therapy Notes  Date: 10.6.20 Time: 1000 Location: [address] Dayroom  Group Topic: Coping Skills  Goal Area(s) Addresses:  Patient will identify positive coping skills Patient will identify benefit of using positive coping skills post d/c.  Behavioral Response:  Engaged  Intervention: Worksheet  Activity: Unhealthy vs. Healthy Coping Strategies.  Patients were to identify a problem they are currently dealing with.  Unhealthy coping strategies they have used and the consequences of using them.  Patient would then identify healthy coping strategies and barriers to using those coping skills.  Education: Radiographer, therapeutic, Dentist.   Education Outcome: Acknowledges understanding/In group clarification offered/Needs additional education.   Clinical Observations/Feedback:  Pt stated her current issue is being in the hospital.  Pt identified her unhealthy coping skill as allowing her emotions to be disruptive but couldn't identify consequences for emotions.  Pt stated her healthy coping strategies are problem solving and playing board games.  Pt expressed her barrier to using these coping skills is not having control over wether she has access to board games.    Victorino Sparrow, LRT/CTRS     Ria Comment, Georgios Kina A 09/08/2019 10:59 AM

## 2019-09-08 NOTE — Progress Notes (Signed)
D:  Patient's self inventory sheet, patient sleeps good, no sleep medication.  Good appetite, normal energy level, good concentration.  Denied depression, hopeless and anxiety.  Denied withdrawals.  Denied SI.  Denied physical problems.  Denied physical pain.  Goal is discharge.  Plans to have parents pick her up  Does have discharge plans. A:  Medications administered per MD orders.  Patient refused medications at first, stating she did not need her medicines since she is being discharged. R:  Denied SI and HI, contracts for safety.  Denied A/V hallucinations.  Safety maintained with 15 minute checks.

## 2019-09-08 NOTE — Progress Notes (Signed)
D: Pt denies SI/HI/AVH. Pt continues to be controversial with Probation officer, but pt more pleasant this evening and able to be verbally redirected before she begins cursing and acting defiant, so pt was more cooperative this evening.  A: Pt was offered support and encouragement. Pt was given scheduled medications. Pt was encourage to attend groups. Q 15 minute checks were done for safety.  R: safety maintained on unit.
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
## 2019-09-08 NOTE — Plan of Care (Signed)
Pt was able to engage in recreation therapy group sessions for the duration of groups.   Victorino Sparrow, LRT/CTRS

## 2019-09-08 NOTE — Progress Notes (Signed)
Recreation Therapy Notes  INPATIENT RECREATION TR PLAN  Patient Details Name: Lisa [name] MRN: [id number] DOB: Sep 08, 1991 Today's Date: 09/08/2019  Rec Therapy Plan Is patient appropriate for Therapeutic Recreation?: Yes Treatment times per week: about 3 days Estimated Length of Stay: 5-7 days TR Treatment/Interventions: Group participation (Comment)  Discharge Criteria Pt will be discharged from therapy if:: Discharged Treatment plan/goals/alternatives discussed and agreed upon by:: Patient/family  Discharge Summary Short term goals set: See patient care plan Short term goals met: Complete Progress toward goals comments: Groups attended Which groups?: Wellness, Coping skills, Other (Comment)(Team building) Reason goals not met: None Therapeutic equipment acquired: N/A Reason patient discharged from therapy: Discharge from hospital Pt/family agrees with progress & goals achieved: Yes Date patient discharged from therapy: 09/08/19    Victorino Sparrow, LRT/CTRS  Ria Comment, Aesha Agrawal A 09/08/2019, 11:09 AM

## 2019-09-08 NOTE — [facility]HH Suicide Risk Assessment (Signed)
[facility] Discharge Suicide Risk Assessment   Principal Problem: Exacerbation of underlying psychotic disorder Discharge Diagnoses: Active Problems:   Schizoaffective disorder, bipolar type ([place])   Total Time spent with patient: 45 minutes  Musculoskeletal: Strength & Muscle Tone: within normal limits Gait & Station: normal Patient leans: N/A  Psychiatric Specialty Exam: ROS  Blood pressure 122/85, pulse (!) 116.There is no height or weight on file to calculate BMI.  General Appearance: Casual  Eye Contact::  Good  Speech:  Clear and Coherent409  Volume:  Normal  Mood:  Euthymic  Affect:  Congruent  Thought Process:  Linear and Descriptions of Associations: Circumstantial  Orientation:  Full (Time, Place, and Person)  Thought Content:  Logical  Suicidal Thoughts:  No  Homicidal Thoughts:  No  Memory:  Immediate;   Good Recent;   Good Remote;   Good  Judgement:  Good  Insight:  Fair  Psychomotor Activity:  Normal  Concentration:  Good  Recall:  Good  Fund of Knowledge:Good  Language: Good  Akathisia:  Negative  Handed:  Right  AIMS (if indicated):     Assets:  Communication Skills Desire for Improvement Physical Health Resilience Social Support  Sleep:  Number of Hours: 4.25  Cognition: WNL  ADL's:  Intact   Mental Status Per Nursing Assessment::   On Admission:  NA  Demographic Factors:  Caucasian  Loss Factors: NA  Historical Factors: NA  Risk Reduction Factors:   Sense of responsibility to family and Religious beliefs about death  Continued Clinical Symptoms:  Bipolar Disorder:   Mixed State  Cognitive Features That Contribute To Risk:  None    Suicide Risk:  Minimal: No identifiable suicidal ideation.  Patients presenting with no risk factors but with morbid ruminations; may be classified as minimal risk based on the severity of the depressive symptoms  Follow-up Information    [facility] Follow up.   Why: Please contact your  therapist Tawanna Solo for a therapy appointment.  Contact information: [place] [address]       Daron Offer, Richard Miu, MD .   Specialty: Psychiatry Contact information: [address] [place] [address] [phone]           Plan Of Care/Follow-up recommendations:  Activity:  full  Patrick Salemi, MD 09/08/2019, 8:31 AM

## 2019-09-08 NOTE — Progress Notes (Signed)
Discharge Note:  Patient discharged home with family member.  Patient denied SI and HI.  Denied A/V hallucinations.  Suicide prevention information given and discussed with patient who stated she understood and had no questions.  Patient stated she received all her belongings.  Patient stated she appreciated all assistance received from [facility] staff.

## 2019-09-08 NOTE — Progress Notes (Signed)
  [facility] Adult Case Management Discharge Plan :  Will you be returning to the same living situation after discharge:  Yes,  home At discharge, do you have transportation home?: Yes,  pt's parents Do you have the ability to pay for your medications: Yes,  private insurance  Release of information consent forms completed and in the chart;  Patient's signature needed at discharge.  Patient to Follow up at: Follow-up Information    [facility] Follow up.   Why: Please contact your therapist Tawanna Solo for a therapy appointment.  Contact information: [place] [address]       Daron Offer, Richard Miu, MD Follow up on 09/10/2019.   Specialty: Psychiatry Why: Medication management appointment is Thursdat 10/8 at 12:30p. Appt will be virtual  Contact information: [address] [place] [address] [place] [address] [phone]           Next level of care provider has access to [facility] and Suicide Prevention discussed: Yes,  with pt; pt declined SPE     Has patient been referred to the Quitline?: N/A patient is not a smoker  Patient has been referred for addiction treatment: Yes  Trecia Rogers, LCSW 09/08/2019, 9:39 AM

## 2019-09-09 NOTE — Progress Notes (Signed)
Discharge note  Patient verbalizes readiness for discharge. Follow up plan explained, AVS, Transition record and SRA given. Prescriptions and teaching provided. Belongings returned and signed for. Suicide safety plan completed and signed. Patient verbalizes understanding. Patient denies SI/HI and assures this writer she will seek assistance should that change. Patient discharged to lobby where patients were waiting.

## 2019-09-14 ENCOUNTER — Encounter (HOSPITAL_COMMUNITY): Payer: Self-pay

## 2019-09-14 ENCOUNTER — Other Ambulatory Visit: Payer: Self-pay

## 2019-09-14 ENCOUNTER — Other Ambulatory Visit: Payer: Self-pay | Admitting: Behavioral Health

## 2019-09-14 ENCOUNTER — Inpatient Hospital Stay (HOSPITAL_COMMUNITY)
Admission: RE | Admit: 2019-09-14 | Discharge: 2019-10-07 | DRG: 885 | Disposition: A | Discharge status: Home / Self Care | Payer: Commercial Managed Care - PPO | Attending: Psychiatry | Admitting: Psychiatry

## 2019-09-14 DIAGNOSIS — I10 Essential (primary) hypertension: Secondary | ICD-10-CM | POA: Diagnosis present

## 2019-09-14 DIAGNOSIS — F250 Schizoaffective disorder, bipolar type: Secondary | ICD-10-CM | POA: Diagnosis present

## 2019-09-14 DIAGNOSIS — G4700 Insomnia, unspecified: Secondary | ICD-10-CM | POA: Diagnosis present

## 2019-09-14 DIAGNOSIS — Z87891 Personal history of nicotine dependence: Secondary | ICD-10-CM | POA: Diagnosis not present

## 2019-09-14 DIAGNOSIS — Z20828 Contact with and (suspected) exposure to other viral communicable diseases: Secondary | ICD-10-CM | POA: Diagnosis present

## 2019-09-14 DIAGNOSIS — Z9114 Patient's other noncompliance with medication regimen: Secondary | ICD-10-CM

## 2019-09-14 LAB — SARS CORONAVIRUS 2 BY RT PCR (HOSPITAL ORDER, PERFORMED IN ~~LOC~~ HOSPITAL LAB): SARS Coronavirus 2: NEGATIVE

## 2019-09-14 MED ORDER — TEMAZEPAM 30 MG PO CAPS
30.0000 mg | ORAL_CAPSULE | Freq: Every day | ORAL | Status: DC
  Administered 2019-09-14 – 2019-09-24 (×11): 30 mg via ORAL
  Filled 2019-09-14 (×11): qty 1

## 2019-09-14 MED ORDER — LORAZEPAM 2 MG/ML IJ SOLN
4.0000 mg | Freq: Once | INTRAMUSCULAR | Status: AC
  Administered 2019-09-14: 4 mg via INTRAMUSCULAR
  Filled 2019-09-14: qty 2

## 2019-09-14 MED ORDER — LORAZEPAM 1 MG PO TABS
2.0000 mg | ORAL_TABLET | ORAL | Status: DC | PRN
  Administered 2019-09-20 – 2019-09-30 (×5): 2 mg via ORAL
  Filled 2019-09-14 (×6): qty 2

## 2019-09-14 MED ORDER — HALOPERIDOL LACTATE 5 MG/ML IJ SOLN
10.0000 mg | Freq: Four times a day (QID) | INTRAMUSCULAR | Status: DC | PRN
  Administered 2019-09-17 – 2019-09-20 (×2): 10 mg via INTRAMUSCULAR
  Filled 2019-09-14 (×2): qty 2

## 2019-09-14 MED ORDER — LORAZEPAM 2 MG/ML IJ SOLN
2.0000 mg | INTRAMUSCULAR | Status: DC | PRN
  Administered 2019-09-17: 2 mg via INTRAMUSCULAR
  Filled 2019-09-14: qty 1

## 2019-09-14 MED ORDER — CARVEDILOL 12.5 MG PO TABS
12.5000 mg | ORAL_TABLET | Freq: Two times a day (BID) | ORAL | Status: DC
  Administered 2019-09-14 – 2019-10-07 (×44): 12.5 mg via ORAL
  Filled 2019-09-14 (×49): qty 1

## 2019-09-14 MED ORDER — RISPERIDONE 2 MG PO TABS
4.0000 mg | ORAL_TABLET | Freq: Two times a day (BID) | ORAL | Status: DC
  Filled 2019-09-14 (×7): qty 2

## 2019-09-14 NOTE — Progress Notes (Signed)
Adult Psychoeducational Group Note  Date:  09/14/2019 Time:  10:46 PM  Group Topic/Focus:  Wrap-Up Group:   The focus of this group is to help patients review their daily goal of treatment and discuss progress on daily workbooks.  Participation Level:  Minimal  Participation Quality:  Drowsy and Resistant  Affect:  Flat, Irritable and Lethargic  Cognitive:  Disorganized and Confused  Insight: Lacking and Limited  Engagement in Group:  Lacking, Limited, None, Off Topic and Poor  Modes of Intervention:  Discussion  Additional Comments:  Pt stated her goal for today was to focus on her treatment plan. Pt stated she felt she accomplished her goal today. Pt stated her relationship with her family has improved since she was admitted here. Pt stated been able to contact her female friend today help improve her day. Pt stated she felt better about herself today. Pt rated her overall day a 9 out of 10. Pt stated her appetite was pretty good today. Pt stated her goal for tonight was to get some rest. Pt did not complain of pain tonight. Pt stated she was not hearing or seeing anything that was not there.  Pt stated she had no thoughts of harming herself or others. Pt stated she would alert staff if anything changes.  Candy Sledge 09/14/2019, 10:46 PM

## 2019-09-14 NOTE — BHH Counselor (Signed)
Disposition: Dr. Dwyane Dee recommends in patient treatment. MSE completed by Mordecai Maes, NP. Discussed with Nira Conn, [facility] for bed placement. No availability at [facility] at this moment but may after discharges. Will check bed status after Covid results.  [place] RN notified of disposition.

## 2019-09-14 NOTE — Progress Notes (Signed)
D: Pt denies SI/HI/AVH. Pt is pleasant and cooperative. Pt has been calm on the unit this evening, not argumentative or verbally aggressive this evening.   A: Pt was offered support and encouragement. Pt was given scheduled medications. Pt was encourage to attend groups. Q 15 minute checks were done for safety.   R:Pt attends groups and interacts well with peers and staff. Pt is taking medication. Pt receptive to treatment and safety maintained on unit.

## 2019-09-14 NOTE — H&P (Addendum)
Behavioral Health Medical Screening Exam  Lisa [name] is an 28 y.o. female.who presents to [facility] as a walk-in, voluntarily. Patient presents with a history of Schizoaffective disorder, bipolar type. She was discharged from [facility] 09/08/2019. Per patients report, it was recommended by her outpatient psychiatrist, Dr. Daron Offer to return to [facility] for psychiatric evaluation after she sent him a number of text messages. She reports that she sent messages stating that she was not taking her medications and she was in withdrawal. Reports current medications as Abilify both oral form and long acting injectable and Ativan and reports only being off her medication for one day. She is mostly cooperative during the evaluation however, there are times where her mood becomes labile. She laughs throughout the evaluation inappropriately. Her thought process is tangential. Descriptions of association are loose. She states," I am normal unlike everyone else who are zombies." She then goes to say things that does no make sense without having asked questions, " I wish everyone would go home. Everyone has guns. I am terrified. I have already given consent so why don't you ask the other person. Security is the problem. The government is corrupt. The [place] judical system is fucked up." She is clearly manic and I am unsure if she has only been off her medications for just one day based off how she is presenting.   Based off my evaluation, she does meet inpatient psychiatric hospitalization.    Total Time spent with patient: 15 minutes  Psychiatric Specialty Exam: Physical Exam  Vitals reviewed. Constitutional: She is oriented to person, place, and time.  Neurological: She is alert and oriented to person, place, and time.    Review of Systems  Psychiatric/Behavioral:       Manic, psychosis     Blood pressure 131/83, pulse (!) 128, temperature 99.5 F (37.5 C), resp. rate 16, SpO2 98 %.There is no height or weight on file  to calculate BMI.  General Appearance: Casual  Eye Contact:  Fair  Speech:  Pressured  Volume:  Increased at times during the evaluation.   Mood:  labile, manic   Affect:  Congruent and Labile  Thought Process:  Disorganized, Irrelevant and Descriptions of Associations: Loose;   Orientation:  Full (Time, Place, and Person)  Thought Content:  Illogical  Suicidal Thoughts:  No  Homicidal Thoughts:  No  Memory:  Immediate;   Fair Recent;   Fair  Judgement:  Impaired  Insight:  Lacking  Psychomotor Activity:  Normal  Concentration: Concentration: Poor and Attention Span: Poor  Recall:  [place]: Fair  Akathisia:  Negative  Handed:  Right  AIMS (if indicated):     Assets:  Communication Skills Resilience  Sleep:       Musculoskeletal: Strength & Muscle Tone: within normal limits Gait & Station: normal Patient leans: N/A  Blood pressure 131/83, pulse (!) 128, temperature 99.5 F (37.5 C), resp. rate 16, SpO2 98 %.  Recommendations:  Based on my evaluation the patient does not appear to have an emergency medical condition.  Based off my evaluation, she does meet inpatient psychiatric hospitalization. Patient was also seen by Dr. Dwyane Dee who has also recommended inpatient psychiatric hospitalization.   Mordecai Maes, NP 09/14/2019, 10:41 AM

## 2019-09-14 NOTE — BH Assessment (Addendum)
Assessment Note  Lisa [name] is an 28 y.o. female presenting voluntarily to [facility] for assessment. She states that her psychiatrist, Dr. Ellouise Newer, recommended her after "receiving some messages." Patient is a poor historian due to AMS but does answer questions to the best of her ability. Patient appears paranoid in waiting area, hiding under the table, and states that the lights are triggering her PTSD due to the "messages" she has received. She is also hyperactive, doing Armed forces operational officer exercises.  Patient denies SI/HI/AVH. However, later in assessment she describes visual hallucinations as "just seeing everything in reality. I could see his hands are actually red from where he was sweating." Patient indicated that she has had numerous hospitalizations in the past due to "bipolar disorder." Patient stated that [place] was [place] and was receiving messages from her phone, her watch, and the airport lights. She states she knew she had a bomb in her watch because she could hear it ticking. Patient reports that her mother has schizophrenia and her father has depression but that it isn't true and they just have PTSD. She denies substance use, criminal charges, or access to weapons.  Patient is alert and oriented x 3. She is dressed appropriately and is calm and cooperative during assessment. Her speech is tangential, her eye contact is fair, and thoughts are disorganized. Patient's mood is pleasant and her affect is anxious. Patient's insight, judgement, and impulse control are impaired. She does not appear to be responding to internal sitmuli at time of assessment. Patient appears to be delusional.  Diagnosis: F25.0 Schizoaffective Disorder, bipolar type  Past Medical History:  Past Medical History:  Diagnosis Date  . Anxiety     Past Surgical History:  Procedure Laterality Date  . extraction of wisdom teeth      Family History:  Family History  Problem Relation Age of Onset  .  Schizophrenia Mother   . Depression Father   . Seizures Sister     Social History:  reports that she quit smoking about 4 years ago. Her smoking use included cigarettes. She has a 0.13 pack-year smoking history. She has never used smokeless tobacco. She reports current alcohol use. She reports that she does not use drugs.  Additional Social History:  Alcohol / Drug Use Pain Medications: see MAR Prescriptions: see MAR Over the Counter: see MAR History of alcohol / drug use?: No history of alcohol / drug abuse  CIWA: CIWA-Ar BP: 131/83 Pulse Rate: (!) 128 COWS:    Allergies:  Allergies  Allergen Reactions  . Artane [Trihexyphenidyl] Anaphylaxis  . Invega [Paliperidone Er] Other (See Comments)    eps and prolactin increase   . Ambien [Zolpidem] Other (See Comments)    Sleep walking  . Other Other (See Comments)    Nuts - food poisoning effect, makes sick on stomach     Home Medications: (Not in a hospital admission)   OB/GYN Status:  No LMP recorded.  General Assessment Data Location of Assessment: [facility] Assessment Services TTS Assessment: In system Is this a Tele or Face-to-Face Assessment?: Face-to-Face Is this an Initial Assessment or a Re-assessment for this encounter?: Initial Assessment Patient Accompanied by:: N/A Language Other than English: No Living Arrangements: Other (Comment) What gender do you identify as?: Female Marital status: Single Maiden name: Parton Pregnancy Status: No Living Arrangements: Alone Can pt return to current living arrangement?: Yes Admission Status: Voluntary Is patient capable of signing voluntary admission?: Yes Referral Source: Psychiatrist Insurance type: [facility]  Crisis Care Plan Living Arrangements: Alone Legal Guardian: (self) Name of Psychiatrist: Dr. Angus PalmsAlexander Eksir [facility] Name of Therapist: None  Education Status Is patient currently in school?: No Highest grade of school patient has completed: Masters  Degree (Social Work) Is the patient employed, unemployed or receiving disability?: Employed  Risk to self with the past 6 months Suicidal Ideation: No Has patient been a risk to self within the past 6 months prior to admission? : No Suicidal Intent: No Has patient had any suicidal intent within the past 6 months prior to admission? : No Is patient at risk for suicide?: No Suicidal Plan?: No Has patient had any suicidal plan within the past 6 months prior to admission? : No Access to Means: No What has been your use of drugs/alcohol within the last 12 months?: denies Previous Attempts/Gestures: No How many times?: 0 Other Self Harm Risks: none Triggers for Past Attempts: None known Intentional Self Injurious Behavior: None Family Suicide History: Unable to assess Recent stressful life event(s): (none noted) Persecutory voices/beliefs?: No Depression: No Depression Symptoms: Feeling angry/irritable Substance abuse history and/or treatment for substance abuse?: No Suicide prevention information given to non-admitted patients: Not applicable  Risk to Others within the past 6 months Homicidal Ideation: No Does patient have any lifetime risk of violence toward others beyond the six months prior to admission? : No Thoughts of Harm to Others: No Current Homicidal Intent: No Current Homicidal Plan: No Access to Homicidal Means: No Identified Victim: none History of harm to others?: No Assessment of Violence: None Noted Violent Behavior Description: none noted Does patient have access to weapons?: No Criminal Charges Pending?: No Does patient have a court date: No Is patient on probation?: No  Psychosis Hallucinations: Visual Delusions: Persecutory, Grandiose  Mental Status Report Appearance/Hygiene: Unremarkable Eye Contact: Fair Motor Activity: Hyperactivity Speech: Logical/coherent Level of Consciousness: Alert, Restless Mood: Pleasant Affect: Anxious Anxiety Level:  Moderate Thought Processes: Flight of Ideas Judgement: Impaired Orientation: Situation, Time, Place, Person Obsessive Compulsive Thoughts/Behaviors: None  Cognitive Functioning Concentration: Poor Memory: Recent Intact, Remote Intact Is patient IDD: No Insight: Fair Impulse Control: Poor Appetite: Good Have you had any weight changes? : No Change Sleep: No Change Total Hours of Sleep: 8 Vegetative Symptoms: None  ADLScreening [facility] Assessment Services) Patient's cognitive ability adequate to safely complete daily activities?: Yes Patient able to express need for assistance with ADLs?: No Independently performs ADLs?: Yes (appropriate for developmental age)  Prior Inpatient Therapy Prior Inpatient Therapy: Yes Prior Therapy Dates: Multiple; 2014, 2015, 2016; 2020 [facility] Reason for Treatment: Bipolar Disorder, Schizophrenia  Prior Outpatient Therapy Prior Outpatient Therapy: Yes Prior Therapy Dates: ongoing Prior Therapy Facilty/Provider(s): Dr. Ellouise [facility] Reason for Treatment: med management Does patient have an ACCT team?: No Does patient have Intensive In-House Services?  : No Does patient have [facility] services? : No Does patient have P4CC services?: No  ADL Screening (condition at time of admission) Patient's cognitive ability adequate to safely complete daily activities?: Yes Is the patient deaf or have difficulty hearing?: No Does the patient have difficulty seeing, even when wearing glasses/contacts?: No Does the patient have difficulty concentrating, remembering, or making decisions?: No Patient able to express need for assistance with ADLs?: No Does the patient have difficulty dressing or bathing?: No Independently performs ADLs?: Yes (appropriate for developmental age) Does the patient have difficulty walking or climbing stairs?: No Weakness of Legs: None Weakness of Arms/Hands: None  Home Assistive  Devices/Equipment Home Assistive Devices/Equipment: None  Therapy Consults (therapy consults require a physician order) PT Evaluation Needed: No OT Evalulation Needed: No SLP Evaluation Needed: No Abuse/Neglect Assessment (Assessment to be complete while patient is alone) Physical Abuse: Denies Verbal Abuse: Denies Sexual Abuse: Denies Exploitation of patient/patient's resources: Denies Self-Neglect: Denies Values / Beliefs Cultural Requests During Hospitalization: None Spiritual Requests During Hospitalization: None Consults Spiritual Care Consult Needed: No Social Work Consult Needed: No Regulatory affairs officer (For Healthcare) Does Patient Have a Medical Advance Directive?: No Would patient like information on creating a medical advance directive?: No - Patient declined          Disposition: Dr. Dwyane Dee recommends in patient treatment. MSE completed by Mordecai Maes, NP. Discussed with Nira Conn, [facility] for bed placement. No availability at [facility] at this moment but may after discharges. Will check bed status after Covid results.  [place] RN notified of disposition. Disposition Initial Assessment Completed for this Encounter: Yes Disposition of Patient: Admit  On Site Evaluation by:   Reviewed with Physician:    Orvis Brill 09/14/2019 10:39 AM

## 2019-09-15 DIAGNOSIS — F250 Schizoaffective disorder, bipolar type: Secondary | ICD-10-CM | POA: Diagnosis not present

## 2019-09-15 LAB — COMPREHENSIVE METABOLIC PANEL
ALT: 75 U/L — ABNORMAL HIGH (ref 0–44)
AST: 115 U/L — ABNORMAL HIGH (ref 15–41)
Albumin: 4.1 g/dL (ref 3.5–5.0)
Alkaline Phosphatase: 60 U/L (ref 38–126)
Anion gap: 12 (ref 5–15)
BUN: 9 mg/dL (ref 6–20)
CO2: 25 mmol/L (ref 22–32)
Calcium: 9.2 mg/dL (ref 8.9–10.3)
Chloride: 101 mmol/L (ref 98–111)
Creatinine, Ser: 0.79 mg/dL (ref 0.44–1.00)
GFR calc Af Amer: >60 mL/min (ref >60)
GFR calc non Af Amer: >60 mL/min (ref >60)
Glucose, Bld: 90 mg/dL (ref 70–99)
Potassium: 3.9 mmol/L (ref 3.5–5.1)
Sodium: 138 mmol/L (ref 135–145)
Total Bilirubin: 0.8 mg/dL (ref 0.3–1.2)
Total Protein: 7 g/dL (ref 6.5–8.1)

## 2019-09-15 LAB — HEMOGLOBIN A1C
Hgb A1c MFr Bld: 5.3 % (ref 4.8–5.6)
Mean Plasma Glucose: 105.41 mg/dL

## 2019-09-15 LAB — ETHANOL: Alcohol, Ethyl (B): <10 mg/dL (ref <10)

## 2019-09-15 LAB — LIPID PANEL
Cholesterol: 210 mg/dL — ABNORMAL HIGH (ref 0–200)
HDL: 60 mg/dL (ref >40)
LDL Cholesterol: 131 mg/dL — ABNORMAL HIGH (ref 0–99)
Total CHOL/HDL Ratio: 3.5 RATIO
Triglycerides: 97 mg/dL (ref <150)
VLDL: 19 mg/dL (ref 0–40)

## 2019-09-15 LAB — TSH: TSH: 1.156 u[IU]/mL (ref 0.350–4.500)

## 2019-09-15 LAB — CBC
HCT: 45.6 % (ref 36.0–46.0)
Hemoglobin: 15.1 g/dL — ABNORMAL HIGH (ref 12.0–15.0)
MCH: 30.7 pg (ref 26.0–34.0)
MCHC: 33.1 g/dL (ref 30.0–36.0)
MCV: 92.7 fL (ref 80.0–100.0)
Platelets: 309 10*3/uL (ref 150–400)
RBC: 4.92 MIL/uL (ref 3.87–5.11)
RDW: 12.3 % (ref 11.5–15.5)
WBC: 8.7 10*3/uL (ref 4.0–10.5)
nRBC: 0 % (ref 0.0–0.2)

## 2019-09-15 MED ORDER — PERPHENAZINE 8 MG PO TABS
8.0000 mg | ORAL_TABLET | Freq: Three times a day (TID) | ORAL | Status: DC
  Administered 2019-09-15 – 2019-09-17 (×6): 8 mg via ORAL
  Filled 2019-09-15 (×8): qty 1
  Filled 2019-09-15: qty 2
  Filled 2019-09-15: qty 1

## 2019-09-15 MED ORDER — LITHIUM CARBONATE ER 450 MG PO TBCR
450.0000 mg | EXTENDED_RELEASE_TABLET | Freq: Two times a day (BID) | ORAL | Status: DC
  Administered 2019-09-15 – 2019-09-18 (×6): 450 mg via ORAL
  Filled 2019-09-15 (×11): qty 1

## 2019-09-15 MED ORDER — LORAZEPAM 1 MG PO TABS
2.0000 mg | ORAL_TABLET | Freq: Three times a day (TID) | ORAL | Status: DC
  Filled 2019-09-15: qty 2

## 2019-09-15 NOTE — Progress Notes (Signed)
Pt up stating " I have the right to refuse injections" . Pt was informed that if she stayed in control there would be no reason for her to get injections. Pt was informed she does have the right to refuse her medications.

## 2019-09-15 NOTE — Progress Notes (Signed)
Pt came to the nursing station requesting "Icy hot" pt could not elaborate on when she hurt her foot. Pt very evasive about what was going on. Pt stated she was going to take her shoe off and that was going to solve the problem.

## 2019-09-15 NOTE — Progress Notes (Signed)
Pt up yelling stating "I did not go to group". Pt being disrespectful verbally and yelling . Pt informed to not wake up peers.

## 2019-09-15 NOTE — Progress Notes (Signed)
Recreation Therapy Notes  Patient admitted to unit 10.12.20. Due to admission within last year, no new assessment conducted at this time. Last assessment conducted 9.29.20. Patient reports no stressors at this time.  Pt states reason for admission as being told to come in voluntarily by her doctor.  Pt expressed some of her recent coping skills have been pliometric exercise and weightlifting along with journaling, tv, music, deep breathing, talking, reading and art.  Pt reports the same leisure interests from previous stay.  Pt states her strengths remain being resilient and her physical strength.  Pt identified area of improvement as "focusing on positivity".   Patient denies SI, HI, AVH at this time. Patient reports goal of "go home"  Information found below from assessment conducted 9.29.20   Leisure Interest:  [place], singing, running, cooking, cleaning and laughing    Worley Radermacher Ria Comment, LRT/CTRS    Victorino Sparrow A 09/15/2019 12:08 PM

## 2019-09-15 NOTE — Progress Notes (Signed)
"   the doctor ordered the wrong medication, he ordered Risperdal and I don't take Risperdal, I take Aripiprazole "

## 2019-09-15 NOTE — Progress Notes (Signed)
Pt up on the unit talking very derogatory to Probation officer . Pt was heard talking very loudly on the phone very negatively about Probation officer .

## 2019-09-15 NOTE — Progress Notes (Signed)
The patient rated her day as a 7 out of a possible 10.  She verbalized that she is feeling tired due to the medication and that she was pleased with the fact that she was able to exercise today. Her goal for tomorrow is to go home.

## 2019-09-15 NOTE — [facility] Suicide Risk Assessment (Signed)
[facility] Admission Suicide Risk Assessment   Nursing information obtained from:  Patient Demographic factors:  Caucasian Current Mental Status:  NA Loss Factors:  NA Historical Factors:  NA Risk Reduction Factors:  NA  Total Time spent with patient: 45 minutes Principal Problem: Requiring readmission shortly after her last discharge due to exacerbation and underlying bipolar symptomatology Diagnosis:  Active Problems:   Schizoaffective disorder, bipolar type (HCC)  Subjective Data: Again patient not fully stable Continued Clinical Symptoms:  Alcohol Use Disorder Identification Test Final Score (AUDIT): 0 The "Alcohol Use Disorders Identification Test", Guidelines for Use in Primary Care, Second Edition.  World Pharmacologist [facility]). Score between 0-7:  no or low risk or alcohol related problems. Score between 8-15:  moderate risk of alcohol related problems. Score between 16-19:  high risk of alcohol related problems. Score 20 or above:  warrants further diagnostic evaluation for alcohol dependence and treatment.   CLINICAL FACTORS:   Bipolar Disorder:   Mixed State   Musculoskeletal: Strength & Muscle Tone: within normal limits Gait & Station: normal Patient leans: N/A  Psychiatric Specialty Exam: Physical Exam  Nursing note and vitals reviewed. Constitutional: She appears well-developed and well-nourished.  Cardiovascular: Normal rate and regular rhythm.    Review of Systems  Constitutional: Negative.   HENT: Negative.   Eyes: Negative.   Cardiovascular: Negative.   Gastrointestinal: Negative.   Neurological: Negative.   Endo/Heme/Allergies: Negative.     Blood pressure 119/74, pulse (!) 113, temperature (!) 97.3 F (36.3 C), temperature source Oral, resp. rate 18, SpO2 98 %.There is no height or weight on file to calculate BMI.  General Appearance: Casual  Eye Contact:  Good  Speech:  Pressured  Volume:  Increased  Mood:  Hypomanic on evaluation this morning   Affect:  Congruent and Full Range  Thought Process:  Linear and Descriptions of Associations: Circumstantial  Orientation:  Full (Time, Place, and Person)  Thought Content:  Illogical and Tangential  Suicidal Thoughts:  No  Homicidal Thoughts:  No  Memory:  Recent;   Poor Remote;   Fair  Judgement:  Impaired  Insight:  Shallow  Psychomotor Activity:  Normal  Concentration:  Concentration: Poor and Attention Span: Poor  Recall:  Poor  Fund of Knowledge:  Fair  Language:  Good  Akathisia:  Negative  Handed:  Right  AIMS (if indicated):     Assets:  Resilience Social Support Talents/Skills  ADL's:  Intact  Cognition:  WNL  Sleep:  Number of Hours: 3.75      COGNITIVE FEATURES THAT CONTRIBUTE TO RISK:  Loss of executive function    SUICIDE RISK:   Minimal: No identifiable suicidal ideation.  Patients presenting with no risk factors but with morbid ruminations; may be classified as minimal risk based on the severity of the depressive symptoms  PLAN OF CARE: Again admit for stabilization  I certify that inpatient services furnished can reasonably be expected to improve the patient's condition.   [name], MD 09/15/2019, 8:00 AM

## 2019-09-15 NOTE — Progress Notes (Signed)
D: Pt denies SI/HI/AVH. Pt is pleasant and cooperative. Pt has been visible on the unit this evening. Pt has not been controversial, pt has been happy and bright affect on the unit laughing at times.  A: Pt was offered support and encouragement. Pt was given scheduled medications. Pt was encourage to attend groups. Q 15 minute checks were done for safety.  R:Pt attends groups and interacts well with peers and staff. Pt is taking medication. Pt has no complaints.Pt receptive to treatment and safety maintained on unit.

## 2019-09-15 NOTE — H&P (Signed)
Psychiatric Admission Assessment Adult  Patient Identification: Lisa [name] MRN:  [id number] Date of Evaluation:  09/15/2019 Chief Complaint:  Schizoaffective Disorder; Bipolar type Principal Diagnosis: <principal problem not specified> Diagnosis:  Active Problems:   Schizoaffective disorder, bipolar type (HCC)  History of Present Illness:   This is the sixth psychiatric admission for Lisa [name], and she was recently here from 9/28 to 10/6-thus she is admitted exactly 1 week after her last discharge  Despite long-acting injectable aripiprazole administered on 10/2 at 400 mg IM, as well as lithium and Risperdal at 8 mg at bedtime, the patient presented on 10/12 with paranoia, some unusual ideas of reference, believing that her current psychiatrist had been "receiving messages from her, and sending them to her" She also expresses numerous bizarre delusions but none were particularly formed or organized. The piece of truth regarding "messages" is that she did indeed send text messages to her psychiatrist, Dr. Rene Kocher, that were indicative of a psychotic thought process, and further she had apparently discontinued her oral medications, and the long-acting injectable simply could not hold her symptomatology at bay.  Since on the unit she has refused the Risperdal which had helped her previously she only wants to take oral aripiprazole, she is argumentative about this but alternates between being pleasant and argumentative, she was described as "yelling" at 1:20 AM this morning, 10/13-IM medications were administered and during our interview she insisted no "the doctor's name who ordered my shot" and feels that it was not justified in so many words.  Current mental status exam-alert oriented to person place time day and situation not date.  Rambling and pressured but redirectable alternates between being a little bit irritable and overall pleasant.  Clearly has racing thoughts but no form delusions  make some disjointed statements but no delusional statements and denies auditory or visual hallucinations and denies wanting to harm herself or others.  Again very focused on medication issues  Associated Signs/Symptoms: Depression Symptoms:  psychomotor agitation, (Hypo) Manic Symptoms:  Flight of Ideas, Anxiety Symptoms:  Excessive Worry, Psychotic Symptoms:  Delusions, PTSD Symptoms: NA Total Time spent with patient: 45 minutes  Past Psychiatric History: Again recently here  Is the patient at risk to self? Yes.    Has the patient been a risk to self in the past 6 months? Yes.    Has the patient been a risk to self within the distant past? Yes.    Is the patient a risk to others? No.  Has the patient been a risk to others in the past 6 months? No.  Has the patient been a risk to others within the distant past? No.   Prior Inpatient Therapy: Prior Inpatient Therapy: Yes Prior Therapy Dates: Multiple; 2014, 2015, 2016; 2020 Prior Therapy Facilty/Provider(s): [facility] Reason for Treatment: Bipolar Disorder, Schizophrenia Prior Outpatient Therapy: Prior Outpatient Therapy: Yes Prior Therapy Dates: ongoing Prior Therapy Facilty/Provider(s): Dr. Ellouise Newer Reason for Treatment: med management Does patient have an ACCT team?: No Does patient have Intensive In-House Services?  : No Does patient have [facility] services? : No Does patient have P4CC services?: No  Alcohol Screening: 1. How often do you have a drink containing alcohol?: Never 2. How many drinks containing alcohol do you have on a typical day when you are drinking?: 1 or 2 3. How often do you have six or more drinks on one occasion?: Never AUDIT-C Score: 0 4. How often during the last year have you found that you were  not able to stop drinking once you had started?: Never 5. How often during the last year have you failed to do what was normally expected from you becasue of drinking?: Never 6. How often during  the last year have you needed a first drink in the morning to get yourself going after a heavy drinking session?: Never 7. How often during the last year have you had a feeling of guilt of remorse after drinking?: Never 8. How often during the last year have you been unable to remember what happened the night before because you had been drinking?: Never 9. Have you or someone else been injured as a result of your drinking?: No 10. Has a relative or friend or a doctor or another health worker been concerned about your drinking or suggested you cut down?: No Alcohol Use Disorder Identification Test Final Score (AUDIT): 0 Substance Abuse History in the last 12 months:  Yes.   Consequences of Substance Abuse: NA Previous Psychotropic Medications: Yes  Psychological Evaluations: No  Past Medical History:  Past Medical History:  Diagnosis Date  . Anxiety     Past Surgical History:  Procedure Laterality Date  . extraction of wisdom teeth     Family History:  Family History  Problem Relation Age of Onset  . Schizophrenia Mother   . Depression Father   . Seizures Sister    Family Psychiatric  History: Schizophrenia in the mother by report Tobacco Screening:   Social History:  Social History   Substance and Sexual Activity  Alcohol Use Yes   Comment: casually     Social History   Substance and Sexual Activity  Drug Use No  . Types: Marijuana   Comment: THC last used 2 yrs ago, unsure of amount    Additional Social History: Marital status: Single    Pain Medications: see MAR Prescriptions: see MAR Over the Counter: see MAR History of alcohol / drug use?: No history of alcohol / drug abuse                    Allergies:   Allergies  Allergen Reactions  . Artane [Trihexyphenidyl] Anaphylaxis  . Invega [Paliperidone Er] Other (See Comments)    eps and prolactin increase   . Ambien [Zolpidem] Other (See Comments)    Sleep walking  . Other Other (See Comments)     Nuts - food poisoning effect, makes sick on stomach    Lab Results:  Results for orders placed or performed during the hospital encounter of 09/14/19 (from the past 48 hour(s))  SARS Coronavirus 2 by RT PCR (hospital order, performed in [facility] hospital lab) Nasopharyngeal Nasopharyngeal Swab     Status: None   Collection Time: 09/14/19 12:11 PM   Specimen: Nasopharyngeal Swab  Result Value Ref Range   SARS Coronavirus 2 NEGATIVE NEGATIVE    Comment: (NOTE) If result is NEGATIVE SARS-CoV-2 target nucleic acids are NOT DETECTED. The SARS-CoV-2 RNA is generally detectable in upper and lower  respiratory specimens during the acute phase of infection. The lowest  concentration of SARS-CoV-2 viral copies this assay can detect is 250  copies / mL. A negative result does not preclude SARS-CoV-2 infection  and should not be used as the sole basis for treatment or other  patient management decisions.  A negative result may occur with  improper specimen collection / handling, submission of specimen other  than nasopharyngeal swab, presence of viral mutation(s) within the  areas targeted by this  assay, and inadequate number of viral copies  (<250 copies / mL). A negative result must be combined with clinical  observations, patient history, and epidemiological information. If result is POSITIVE SARS-CoV-2 target nucleic acids are DETECTED. The SARS-CoV-2 RNA is generally detectable in upper and lower  respiratory specimens dur ing the acute phase of infection.  Positive  results are indicative of active infection with SARS-CoV-2.  Clinical  correlation with patient history and other diagnostic information is  necessary to determine patient infection status.  Positive results do  not rule out bacterial infection or co-infection with other viruses. If result is PRESUMPTIVE POSTIVE SARS-CoV-2 nucleic acids MAY BE PRESENT.   A presumptive positive result was obtained on the submitted specimen   and confirmed on repeat testing.  While 2019 novel coronavirus  (SARS-CoV-2) nucleic acids may be present in the submitted sample  additional confirmatory testing may be necessary for epidemiological  and / or clinical management purposes  to differentiate between  SARS-CoV-2 and other Sarbecovirus currently known to infect humans.  If clinically indicated additional testing with an alternate test  methodology [phone]) is advised. The SARS-CoV-2 RNA is generally  detectable in upper and lower respiratory sp ecimens during the acute  phase of infection. The expected result is Negative. Fact Sheet for Patients:  StrictlyIdeas.no Fact Sheet for Healthcare Providers: BankingDealers.co.za This test is not yet approved or cleared by the [place] FDA and has been authorized for detection and/or diagnosis of SARS-CoV-2 by FDA under an Emergency Use Authorization (EUA).  This EUA will remain in effect (meaning this test can be used) for the duration of the COVID-19 declaration under Section 564(b)(1) of the Act, 21 U.S.C. section 360bbb-3(b)(1), unless the authorization is terminated or revoked sooner. Performed at [facility], [place] [address], [place], [place] [address]   CBC     Status: Abnormal   Collection Time: 09/15/19  6:24 AM  Result Value Ref Range   WBC 8.7 4.0 - 10.5 K/uL   RBC 4.92 3.87 - 5.11 MIL/uL   Hemoglobin 15.1 (H) 12.0 - 15.0 g/dL   HCT 45.6 36.0 - 46.0 %   MCV 92.7 80.0 - 100.0 fL   MCH 30.7 26.0 - 34.0 pg   MCHC 33.1 30.0 - 36.0 g/dL   RDW 12.3 11.5 - 15.5 %   Platelets 309 150 - 400 K/uL   nRBC 0.0 0.0 - 0.2 %    Comment: Performed at [facility], [place] [address], [place], [place] [address]    Blood Alcohol level:  Lab Results  Component Value Date   [facility] <10 08/31/2019   ETH <5 25/42/7062    Metabolic Disorder Labs:  Lab Results  Component Value Date   HGBA1C 5.2  05/14/2015   MPG 103 05/14/2015   Lab Results  Component Value Date   PROLACTIN 18.6 06/10/2015   PROLACTIN 77.4 (H) 05/14/2015   Lab Results  Component Value Date   CHOL 143 05/14/2015   TRIG 120 05/14/2015   HDL 42 05/14/2015   CHOLHDL 3.4 05/14/2015   VLDL 24 05/14/2015   LDLCALC 77 05/14/2015    Current Medications: Current Facility-Administered Medications  Medication Dose Route Frequency Provider Last Rate Last Dose  . carvedilol (COREG) tablet 12.5 mg  12.5 mg Oral BID WC [name], MD   12.5 mg at 09/14/19 1635  . haloperidol lactate (HALDOL) injection 10 mg  10 mg Intramuscular Q6H PRN [name], MD      .  LORazepam (ATIVAN) tablet 2 mg  2 mg Oral Q4H PRN Malvin Johns, MD       Or  . LORazepam (ATIVAN) injection 2 mg  2 mg Intramuscular Q4H PRN Malvin Johns, MD      . risperiDONE (RISPERDAL) tablet 4 mg  4 mg Oral BID Malvin Johns, MD      . temazepam (RESTORIL) capsule 30 mg  30 mg Oral QHS Malvin Johns, MD   30 mg at 09/14/19 2101   PTA Medications: Medications Prior to Admission  Medication Sig Dispense Refill Last Dose  . [START ON 10/05/2019] ARIPiprazole ER (ABILIFY MAINTENA) 400 MG SRER injection Inject 2 mLs (400 mg total) into the muscle every 28 (twenty-eight) days. DUE 11/2 1 each 11   . benztropine (COGENTIN) 1 MG tablet Take 1 tablet (1 mg total) by mouth 2 (two) times daily. 60 tablet 2   . carvedilol (COREG) 12.5 MG tablet Take 1 tablet (12.5 mg total) by mouth 2 (two) times daily with a meal. 60 tablet 2   . levonorgestrel-ethinyl estradiol (SEASONALE) 0.15-0.03 MG tablet Take 1 tablet by mouth daily.     [name] lithium carbonate (ESKALITH) 450 MG CR tablet Take 1 tablet (450 mg total) by mouth every 12 (twelve) hours. 60 tablet 2   . omega-3 acid ethyl esters (LOVAZA) 1 g capsule Take 1 capsule (1 g total) by mouth 2 (two) times daily. 60 capsule 11   . risperiDONE (RISPERDAL) 4 MG tablet Take 2 tablets (8 mg total) by mouth at bedtime. 60 tablet 2   .  temazepam (RESTORIL) 30 MG capsule Take 1 capsule (30 mg total) by mouth at bedtime. 30 capsule 0    Musculoskeletal: Strength & Muscle Tone: within normal limits Gait & Station: normal Patient leans: N/A  Psychiatric Specialty Exam: Physical Exam  Nursing note and vitals reviewed. Constitutional: She appears well-developed and well-nourished.  Cardiovascular: Normal rate and regular rhythm.    Review of Systems  Constitutional: Negative.   HENT: Negative.   Eyes: Negative.   Cardiovascular: Negative.   Gastrointestinal: Negative.   Neurological: Negative.   Endo/Heme/Allergies: Negative.     Blood pressure 119/74, pulse (!) 113, temperature (!) 97.3 F (36.3 C), temperature source Oral, resp. rate 18, SpO2 98 %.There is no height or weight on file to calculate BMI.  General Appearance: Casual  Eye Contact:  Good  Speech:  Pressured  Volume:  Increased  Mood:  Hypomanic on evaluation this morning  Affect:  Congruent and Full Range  Thought Process:  Linear and Descriptions of Associations: Circumstantial  Orientation:  Full (Time, Place, and Person)  Thought Content:  Illogical and Tangential  Suicidal Thoughts:  No  Homicidal Thoughts:  No  Memory:  Recent;   Poor Remote;   Fair  Judgement:  Impaired  Insight:  Shallow  Psychomotor Activity:  Normal  Concentration:  Concentration: Poor and Attention Span: Poor  Recall:  Poor  Fund of Knowledge:  Fair  Language:  Good  Akathisia:  Negative  Handed:  Right  AIMS (if indicated):     Assets:  Resilience Social Support Talents/Skills  ADL's:  Intact  Cognition:  WNL  Sleep:  Number of Hours: 3.75      Treatment Plan Summary: Daily contact with patient to assess and evaluate symptoms and progress in treatment and Medication management  Observation Level/Precautions:  15 minute checks  Laboratory:  UDS  Psychotherapy: Reality based  Medications: Clearly needs antipsychotic in addition to long-acting injectable  Consultations: None necessary  Discharge Concerns: Longer term stability  Estimated LOS: 7-10  Other: Axis I schizoaffective bipolar type acute exacerbation/noncompliance/Axis II defer Axis III medically stable   Physician Treatment Plan for Primary Diagnosis: For schizoaffective condition resume oral antipsychotics as well as lithium Long Term Goal(s): Improvement in symptoms so as ready for discharge  Short Term Goals: Ability to verbalize feelings will improve, Ability to disclose and discuss suicidal ideas, Ability to demonstrate self-control will improve and Ability to identify and develop effective coping behaviors will improve  Physician Treatment Plan for Secondary Diagnosis: Active Problems:   Schizoaffective disorder, bipolar type (HCC)  Long Term Goal(s): Improvement in symptoms so as ready for discharge  Short Term Goals: Ability to disclose and discuss suicidal ideas, Ability to demonstrate self-control will improve, Ability to identify and develop effective coping behaviors will improve and Ability to maintain clinical measurements within normal limits will improve  I certify that inpatient services furnished can reasonably be expected to improve the patient's condition.    Malvin JohnsFARAH,Maverick Patman, MD 10/13/20208:02 AM

## 2019-09-15 NOTE — Progress Notes (Signed)
Recreation Therapy Notes  Date: 10.13.20 Time: 1000 Location: 400 Hall Dayroom  Group Topic: Self-Esteem  Goal Area(s) Addresses:  Patient will successfully identify positive attributes about themselves.  Patient will successfully identify benefit of improved self-esteem.   Behavioral Response: Engaged  Intervention: [employer], face outlines, music  Activity: How I See Me.  Patients were given a blank outline of a face. Using words and drawing, patients were to express how they see themselves and identify all positive characteristics.  Education:  Self-Esteem, Dentist.   Education Outcome: Acknowledges education/In group clarification offered/Needs additional education  Clinical Observations/Feedback: Pt was appropriate and engaged throughout activity.  Pt sang along with the music that played during group.  In the picture, pt gave herself colorful hair, thin eyebrows, green eyes and smile lines.  Pt used words such as hero, water, tired, Nurse, adult, love, dignity, fair justice, accountability, family and humble to describe herself.     Victorino Sparrow, LRT/CTRS    Victorino Sparrow A 09/15/2019 11:45 AM

## 2019-09-16 ENCOUNTER — Other Ambulatory Visit: Payer: Self-pay

## 2019-09-16 DIAGNOSIS — F250 Schizoaffective disorder, bipolar type: Secondary | ICD-10-CM | POA: Diagnosis not present

## 2019-09-16 LAB — PROLACTIN: Prolactin: 16.5 ng/mL (ref 4.8–23.3)

## 2019-09-16 MED ORDER — CARBAMAZEPINE 100 MG PO CHEW
100.0000 mg | CHEWABLE_TABLET | Freq: Three times a day (TID) | ORAL | Status: DC
  Administered 2019-09-17: 100 mg via ORAL
  Filled 2019-09-16 (×6): qty 1

## 2019-09-16 MED ORDER — CLONAZEPAM 1 MG PO TABS: 2.0000 mg | ORAL_TABLET | Freq: Once | ORAL | Status: DC

## 2019-09-16 NOTE — Progress Notes (Signed)
Pt seen pacing the unit and sitting in the hallway with peer Vonna Kotyk) , pt seemed to be getting confrontational and oppositional this evening.

## 2019-09-16 NOTE — Progress Notes (Signed)
Pt starting to be defiant, pt standing at the end of the hall flickering the lights . Pt pacing the hall with peer trying to annoy staff by talking aggressively

## 2019-09-16 NOTE — Tx Team (Signed)
Interdisciplinary Treatment and Diagnostic Plan Update  09/16/2019 Time of Session: 10:00am Lisa [name] MRN: [id number]  Principal Diagnosis: <principal problem not specified>  Secondary Diagnoses: Active Problems:   Schizoaffective disorder, bipolar type ([place])   Current Medications:  Current Facility-Administered Medications  Medication Dose Route Frequency Provider Last Rate Last Dose  . carbamazepine (TEGRETOL) chewable tablet 100 mg  100 mg Oral TID [name], MD      . carvedilol (COREG) tablet 12.5 mg  12.5 mg Oral BID WC [name], MD   12.5 mg at 09/16/19 0744  . haloperidol lactate (HALDOL) injection 10 mg  10 mg Intramuscular Q6H PRN [name], MD      . lithium carbonate (ESKALITH) CR tablet 450 mg  450 mg Oral Q12H [name], MD   450 mg at 09/16/19 0744  . LORazepam (ATIVAN) tablet 2 mg  2 mg Oral Q4H PRN [name], MD       Or  . LORazepam (ATIVAN) injection 2 mg  2 mg Intramuscular Q4H PRN [name], MD      . perphenazine (TRILAFON) tablet 8 mg  8 mg Oral TID [name], MD   8 mg at 09/16/19 0744  . temazepam (RESTORIL) capsule 30 mg  30 mg Oral QHS [name], MD   30 mg at 09/15/19 2108   PTA Medications: Medications Prior to Admission  Medication Sig Dispense Refill Last Dose  . ARIPiprazole (ABILIFY) 10 MG tablet Take 10 mg by mouth daily.     Derrill Memo ON 10/05/2019] ARIPiprazole ER (ABILIFY MAINTENA) 400 MG SRER injection Inject 2 mLs (400 mg total) into the muscle every 28 (twenty-eight) days. DUE 11/2 1 each 11 09/04/2019  . benztropine (COGENTIN) 1 MG tablet Take 1 tablet (1 mg total) by mouth 2 (two) times daily. 60 tablet 2   . carvedilol (COREG) 12.5 MG tablet Take 1 tablet (12.5 mg total) by mouth 2 (two) times daily with a meal. 60 tablet 2   . levonorgestrel-ethinyl estradiol (SEASONALE) 0.15-0.03 MG tablet Take 1 tablet by mouth daily.     [name] lithium carbonate (ESKALITH) 450 MG CR tablet Take 1 tablet (450 mg total) by mouth  every 12 (twelve) hours. 60 tablet 2   . LORazepam (ATIVAN) 2 MG tablet Take 2 mg by mouth daily.     [name] omega-3 acid ethyl esters (LOVAZA) 1 g capsule Take 1 capsule (1 g total) by mouth 2 (two) times daily. 60 capsule 11   . temazepam (RESTORIL) 30 MG capsule Take 1 capsule (30 mg total) by mouth at bedtime. 30 capsule 0   . risperiDONE (RISPERDAL) 4 MG tablet Take 2 tablets (8 mg total) by mouth at bedtime. (Patient not taking: Reported on 09/15/2019) 60 tablet 2 Not Taking at Unknown time    Patient Stressors:    Patient Strengths:    Treatment Modalities: Medication Management, Group therapy, Case management,  1 to 1 session with clinician, Psychoeducation, Recreational therapy.   Physician Treatment Plan for Primary Diagnosis: <principal problem not specified> Long Term Goal(s): Improvement in symptoms so as ready for discharge Improvement in symptoms so as ready for discharge   Short Term Goals: Ability to verbalize feelings will improve Ability to disclose and discuss suicidal ideas Ability to demonstrate self-control will improve Ability to identify and develop effective coping behaviors will improve Ability to disclose and discuss suicidal ideas Ability to demonstrate self-control will improve Ability to identify and develop effective coping behaviors will improve Ability to maintain  clinical measurements within normal limits will improve  Medication Management: Evaluate patient's response, side effects, and tolerance of medication regimen.  Therapeutic Interventions: 1 to 1 sessions, Unit Group sessions and Medication administration.  Evaluation of Outcomes: Progressing  Physician Treatment Plan for Secondary Diagnosis: Active Problems:   Schizoaffective disorder, bipolar type (HCC)  Long Term Goal(s): Improvement in symptoms so as ready for discharge Improvement in symptoms so as ready for discharge   Short Term Goals: Ability to verbalize feelings will  improve Ability to disclose and discuss suicidal ideas Ability to demonstrate self-control will improve Ability to identify and develop effective coping behaviors will improve Ability to disclose and discuss suicidal ideas Ability to demonstrate self-control will improve Ability to identify and develop effective coping behaviors will improve Ability to maintain clinical measurements within normal limits will improve     Medication Management: Evaluate patient's response, side effects, and tolerance of medication regimen.  Therapeutic Interventions: 1 to 1 sessions, Unit Group sessions and Medication administration.  Evaluation of Outcomes: Progressing   RN Treatment Plan for Primary Diagnosis: <principal problem not specified> Long Term Goal(s): Knowledge of disease and therapeutic regimen to maintain health will improve  Short Term Goals: Ability to participate in decision making will improve, Ability to verbalize feelings will improve, Ability to disclose and discuss suicidal ideas, Ability to identify and develop effective coping behaviors will improve and Compliance with prescribed medications will improve  Medication Management: RN will administer medications as ordered by provider, will assess and evaluate patient's response and provide education to patient for prescribed medication. RN will report any adverse and/or side effects to prescribing provider.  Therapeutic Interventions: 1 on 1 counseling sessions, Psychoeducation, Medication administration, Evaluate responses to treatment, Monitor vital signs and CBGs as ordered, Perform/monitor CIWA, COWS, AIMS and Fall Risk screenings as ordered, Perform wound care treatments as ordered.  Evaluation of Outcomes: Progressing   LCSW Treatment Plan for Primary Diagnosis: <principal problem not specified> Long Term Goal(s): Safe transition to appropriate next level of care at discharge, Engage patient in therapeutic group addressing  interpersonal concerns.  Short Term Goals: Engage patient in aftercare planning with referrals and resources and Increase skills for wellness and recovery  Therapeutic Interventions: Assess for all discharge needs, 1 to 1 time with Social worker, Explore available resources and support systems, Assess for adequacy in community support network, Educate family and significant other(s) on suicide prevention, Complete Psychosocial Assessment, Interpersonal group therapy.  Evaluation of Outcomes: Progressing   Progress in Treatment: Attending groups: Yes. Participating in groups: Yes. Taking medication as prescribed: Yes. Toleration medication: Yes. Family/Significant other contact made: No, will contact:  pt's friend Patient understands diagnosis: Yes. Discussing patient identified problems/goals with staff: Yes. Medical problems stabilized or resolved: Yes. Denies suicidal/homicidal ideation: Yes. Issues/concerns per patient self-inventory: No. Other:   New problem(s) identified: No, Describe:  None  New Short Term/Long Term Goal(s): Medication stabilization, elimination of SI thoughts, and development of a comprehensive mental wellness plan.   Patient Goals:  "To go home"  Discharge Plan or Barriers: Patient wants to be discharged back home and continue with her psychiatrist, Dr. Rene Kocher and continue with her therapy that is on an online platform.   Reason for Continuation of Hospitalization: Mania Medication stabilization  Estimated Length of Stay: 2-3 days   Attendees:  Patient: Lisa [name] 09/16/2019   Physician: Dr. Malvin Johns, MD 09/16/2019   Nursing: Marton Redwood, MD 09/16/2019   RN Care Manager: 09/16/2019   Social Worker: Stephannie Peters, LCSW  09/16/2019   Recreational Therapist:  09/16/2019   Other:  09/16/2019   Other:  09/16/2019   Other: 09/16/2019     Scribe for Treatment Team: Delphia GratesJasmine M Adelise Buswell, LCSW 09/16/2019 11:12 AM

## 2019-09-16 NOTE — Progress Notes (Signed)
[facility] MD Progress Note  09/16/2019 10:47 AM Lisa [name]  MRN:  [id number] Subjective:   Patient remains hypomanic, expansive, very active on the ward and has spent a good deal of the morning going from patient to patient and various staff members asking them if they recall the particular hormone that your body releases "when you hug someone" and continues to focus on this.  Medications are discussed she does not want lorazepam which is fine she is compliant with the perphenazine, Eskalith, in discussing her case with her outpatient psychiatrist we believe that paliperidone long-acting injectable would be optimal.  No EPS or TD remains again active and energetic and hypomanic but denies thoughts of harming self or others/no formed delusions but rather disjointed statements at intervals Principal Problem: Bipolar/manic symptomatology Diagnosis: Active Problems:   Schizoaffective disorder, bipolar type (HCC)  Total Time spent with patient: 20 minutes  Past Psychiatric History: Extensive, due for next long-acting injectable of aripiprazole November 2 so we will replace this with paliperidone  Past Medical History:  Past Medical History:  Diagnosis Date  . Anxiety     Past Surgical History:  Procedure Laterality Date  . extraction of wisdom teeth     Family History:  Family History  Problem Relation Age of Onset  . Schizophrenia Mother   . Depression Father   . Seizures Sister    Family Psychiatric  History: No new data Social History:  Social History   Substance and Sexual Activity  Alcohol Use Yes   Comment: casually     Social History   Substance and Sexual Activity  Drug Use No  . Types: Marijuana   Comment: THC last used 2 yrs ago, unsure of amount    Social History   Socioeconomic History  . Marital status: Single    Spouse name: Not on file  . Number of children: Not on file  . Years of education: Not on file  . Highest education level: Not on file   Occupational History  . Not on file  Social Needs  . Financial resource strain: Not on file  . Food insecurity    Worry: Not on file    Inability: Not on file  . Transportation needs    Medical: Not on file    Non-medical: Not on file  Tobacco Use  . Smoking status: Former Smoker    Packs/day: 0.25    Years: 0.50    Pack years: 0.12    Types: Cigarettes    Quit date: 09/03/2015    Years since quitting: 4.0  . Smokeless tobacco: Never Used  Substance and Sexual Activity  . Alcohol use: Yes    Comment: casually  . Drug use: No    Types: Marijuana    Comment: THC last used 2 yrs ago, unsure of amount  . Sexual activity: Not Currently    Comment: Plan B Pill  Lifestyle  . Physical activity    Days per week: Not on file    Minutes per session: Not on file  . Stress: Not on file  Relationships  . Social Musician on phone: Not on file    Gets together: Not on file    Attends religious service: Not on file    Active member of club or organization: Not on file    Attends meetings of clubs or organizations: Not on file    Relationship status: Not on file  Other Topics Concern  . Not on file  Social History Narrative  . Not on file   Additional Social History:    Pain Medications: see MAR Prescriptions: see MAR Over the Counter: see MAR History of alcohol / drug use?: No history of alcohol / drug abuse                    Sleep: Fair  Appetite:  Fair  Current Medications: Current Facility-Administered Medications  Medication Dose Route Frequency Provider Last Rate Last Dose  . carbamazepine (TEGRETOL) chewable tablet 100 mg  100 mg Oral TID [name], MD      . carvedilol (COREG) tablet 12.5 mg  12.5 mg Oral BID WC [name], MD   12.5 mg at 09/16/19 0744  . haloperidol lactate (HALDOL) injection 10 mg  10 mg Intramuscular Q6H PRN [name], MD      . lithium carbonate (ESKALITH) CR tablet 450 mg  450 mg Oral Q12H [name], MD   450  mg at 09/16/19 0744  . LORazepam (ATIVAN) tablet 2 mg  2 mg Oral Q4H PRN [name], MD       Or  . LORazepam (ATIVAN) injection 2 mg  2 mg Intramuscular Q4H PRN [name], MD      . perphenazine (TRILAFON) tablet 8 mg  8 mg Oral TID [name], MD   8 mg at 09/16/19 0744  . temazepam (RESTORIL) capsule 30 mg  30 mg Oral QHS [name], MD   30 mg at 09/15/19 2108    Lab Results:  Results for orders placed or performed during the hospital encounter of 09/14/19 (from the past 48 hour(s))  SARS Coronavirus 2 by RT PCR (hospital order, performed in [facility] hospital lab) Nasopharyngeal Nasopharyngeal Swab     Status: None   Collection Time: 09/14/19 12:11 PM   Specimen: Nasopharyngeal Swab  Result Value Ref Range   SARS Coronavirus 2 NEGATIVE NEGATIVE    Comment: (NOTE) If result is NEGATIVE SARS-CoV-2 target nucleic acids are NOT DETECTED. The SARS-CoV-2 RNA is generally detectable in upper and lower  respiratory specimens during the acute phase of infection. The lowest  concentration of SARS-CoV-2 viral copies this assay can detect is 250  copies / mL. A negative result does not preclude SARS-CoV-2 infection  and should not be used as the sole basis for treatment or other  patient management decisions.  A negative result may occur with  improper specimen collection / handling, submission of specimen other  than nasopharyngeal swab, presence of viral mutation(s) within the  areas targeted by this assay, and inadequate number of viral copies  (<250 copies / mL). A negative result must be combined with clinical  observations, patient history, and epidemiological information. If result is POSITIVE SARS-CoV-2 target nucleic acids are DETECTED. The SARS-CoV-2 RNA is generally detectable in upper and lower  respiratory specimens dur ing the acute phase of infection.  Positive  results are indicative of active infection with SARS-CoV-2.  Clinical  correlation with patient  history and other diagnostic information is  necessary to determine patient infection status.  Positive results do  not rule out bacterial infection or co-infection with other viruses. If result is PRESUMPTIVE POSTIVE SARS-CoV-2 nucleic acids MAY BE PRESENT.   A presumptive positive result was obtained on the submitted specimen  and confirmed on repeat testing.  While 2019 novel coronavirus  (SARS-CoV-2) nucleic acids may be present in the submitted sample  additional confirmatory testing may be necessary for epidemiological  and /  or clinical management purposes  to differentiate between  SARS-CoV-2 and other Sarbecovirus currently known to infect humans.  If clinically indicated additional testing with an alternate test  methodology [phone]([id number]) is advised. The SARS-CoV-2 RNA is generally  detectable in upper and lower respiratory sp ecimens during the acute  phase of infection. The expected result is Negative. Fact Sheet for Patients:  BoilerBrush.com.cyhttps://www.fda.gov/media/136312/download Fact Sheet for Healthcare Providers: https://pope.com/https://www.fda.gov/media/136313/download This test is not yet approved or cleared by the [place] FDA and has been authorized for detection and/or diagnosis of SARS-CoV-2 by FDA under an Emergency Use Authorization (EUA).  This EUA will remain in effect (meaning this test can be used) for the duration of the COVID-19 declaration under Section 564(b)(1) of the Act, 21 U.S.C. section 360bbb-3(b)(1), unless the authorization is terminated or revoked sooner. Performed at [facility], [address] [place], [place] [address]   CBC     Status: Abnormal   Collection Time: 09/15/19  6:24 AM  Result Value Ref Range   WBC 8.7 4.0 - 10.5 K/uL   RBC 4.92 3.87 - 5.11 MIL/uL   Hemoglobin 15.1 (H) 12.0 - 15.0 g/dL   HCT 81.145.6 91.436.0 - 78.246.0 %   MCV 92.7 80.0 - 100.0 fL   MCH 30.7 26.0 - 34.0 pg   MCHC 33.1 30.0 - 36.0 g/dL   RDW 95.612.3 21.311.5 - 08.615.5 %    Platelets 309 150 - 400 K/uL   nRBC 0.0 0.0 - 0.2 %    Comment: Performed at [facility], [address] [place], [place] [address]  Comprehensive metabolic panel     Status: Abnormal   Collection Time: 09/15/19  6:24 AM  Result Value Ref Range   Sodium 138 135 - 145 mmol/L   Potassium 3.9 3.5 - 5.1 mmol/L   Chloride 101 98 - 111 mmol/L   CO2 25 22 - 32 mmol/L   Glucose, Bld 90 70 - 99 mg/dL   BUN 9 6 - 20 mg/dL   Creatinine, Ser 9.620.79 0.44 - 1.00 mg/dL   Calcium 9.2 8.9 - 95.210.3 mg/dL   Total Protein 7.0 6.5 - 8.1 g/dL   Albumin 4.1 3.5 - 5.0 g/dL   AST 841115 (H) 15 - 41 U/L   ALT 75 (H) 0 - 44 U/L   Alkaline Phosphatase 60 38 - 126 U/L   Total Bilirubin 0.8 0.3 - 1.2 mg/dL   GFR calc non Af Amer >60 >60 mL/min   GFR calc Af Amer >60 >60 mL/min   Anion gap 12 5 - 15    Comment: Performed at [facility], [address] [place], [place] [address]  Ethanol     Status: None   Collection Time: 09/15/19  6:24 AM  Result Value Ref Range   Alcohol, Ethyl (B) <10 <10 mg/dL    Comment: (NOTE) Lowest detectable limit for serum alcohol is 10 mg/dL. For medical purposes only. Performed at [facility], [address] [place], [place] [address]   Hemoglobin A1c     Status: None   Collection Time: 09/15/19  6:24 AM  Result Value Ref Range   Hgb A1c MFr Bld 5.3 4.8 - 5.6 %    Comment: (NOTE) Pre diabetes:          5.7%-6.4% Diabetes:              >6.4% Glycemic control for   <7.0% adults with diabetes    Mean Plasma  Glucose 105.41 mg/dL    Comment: Performed at [facility], [address] [place], [place] [address]  Lipid panel     Status: Abnormal   Collection Time: 09/15/19  6:24 AM  Result Value Ref Range   Cholesterol 210 (H) 0 - 200 mg/dL   Triglycerides 97 <935 mg/dL   HDL 60 >70 mg/dL   Total CHOL/HDL Ratio 3.5 RATIO   VLDL 19 0 - 40 mg/dL   LDL Cholesterol 177 (H) 0 - 99 mg/dL    Comment:        Total  Cholesterol/HDL:CHD Risk Coronary Heart Disease Risk Table                     Men   Women  1/2 Average Risk   3.4   3.3  Average Risk       5.0   4.4  2 X Average Risk   9.6   7.1  3 X Average Risk  23.4   11.0        Use the calculated Patient Ratio above and the CHD Risk Table to determine the patient's CHD Risk.        ATP III CLASSIFICATION (LDL):  <100     mg/dL   Optimal  939-030  mg/dL   Near or Above                    Optimal  130-159  mg/dL   Borderline  092-330  mg/dL   High  >076     mg/dL   Very High Performed at [facility], [address], [place], [place] [address]   Prolactin     Status: None   Collection Time: 09/15/19  6:24 AM  Result Value Ref Range   Prolactin 16.5 4.8 - 23.3 ng/mL    Comment: (NOTE) Performed At: [facility] [address] [place], [place] [address] Jolene Schimke MD SL:[id number]   TSH     Status: None   Collection Time: 09/15/19  6:24 AM  Result Value Ref Range   TSH 1.156 0.350 - 4.500 uIU/mL    Comment: Performed by a 3rd Generation assay with a functional sensitivity of <=0.01 uIU/mL. Performed at [facility], [address], [place], [place] [address]     Blood Alcohol level:  Lab Results  Component Value Date   ETH <10 09/15/2019   ETH <10 08/31/2019    Metabolic Disorder Labs: Lab Results  Component Value Date   HGBA1C 5.3 09/15/2019   MPG 105.41 09/15/2019   MPG 103 05/14/2015   Lab Results  Component Value Date   PROLACTIN 16.5 09/15/2019   PROLACTIN 18.6 06/10/2015   Lab Results  Component Value Date   CHOL 210 (H) 09/15/2019   TRIG 97 09/15/2019   HDL 60 09/15/2019   CHOLHDL 3.5 09/15/2019   VLDL 19 09/15/2019   LDLCALC 131 (H) 09/15/2019   LDLCALC 77 05/14/2015   Musculoskeletal: Strength & Muscle Tone: within normal limits Gait & Station: normal Psychiatric Specialty Exam: Physical Exam  ROS  Blood pressure 119/90, pulse 96, temperature (!)  97.5 F (36.4 C), resp. rate 16, SpO2 98 %.There is no height or weight on file to calculate BMI.  General Appearance: Casual  Eye Contact:  Good  Speech:  Clear and Coherent and Pressured  Volume:  Increased  Mood:  Hypomanic to manic  Affect:  Congruent  Thought Process:  Descriptions of Associations:  Loose  Orientation:  Full (Time, Place, and Person)  Thought Content:  Tangential  Suicidal Thoughts:  No  Homicidal Thoughts:  No  Memory:  Immediate;   Poor Recent;   Fair Remote;   Good  Judgement:  Fair  Insight:  Shallow  Psychomotor Activity:  Increased  Concentration:  Concentration: Poor and Attention Span: Poor  Recall:  [employer] of Knowledge:  Fair  Language:  Fair-tends to break into song at intervals  Akathisia:  Negative  Handed:  Right  AIMS (if indicated):     Assets:  Physical Health Resilience  ADL's:  Intact  Cognition:  WNL  Sleep:  Number of Hours: 5.75     Treatment Plan Summary: Daily contact with patient to assess and evaluate symptoms and progress in treatment and Medication management  We will continue perphenazine continue lithium, add carbamazepine to help stabilize mood.  Continue to monitor for safety no change in precautions continue reality based therapy med and illness education  Malvin Johns, MD 09/16/2019, 10:47 AM

## 2019-09-16 NOTE — Progress Notes (Signed)
Patient ID: Lisa [name], female   DOB: 1991-10-10, 28 y.o.   MRN: [id number]  CSW contacted pt's psychiatrist, Dr. Daron Offer, to discuss aftercare services for the patient. Dr. Daron Offer stated that he will not continue to see the patient if she does not agree to some kind of PHP or IOP program (since she does not have the payer source for an ACTT team). Dr. Daron Offer stated that he also will not write her back into work unless she agrees. Dr. Daron Offer stated that she has called him 5 times per day since being in the hospital and stated that she is not stable and she is too chronic/acute for just him to see her in outpatient.  CSW informed Dr. Daron Offer that CSW will speak with patient and will get back to him, as soon as possible.

## 2019-09-16 NOTE — BHH Counselor (Signed)
Adult Comprehensive Assessment  Patient ID: Lisa [name], female   DOB: 12/13/1990, 28 y.o.   MRN: [id number]  Information Source: Information source: Patient  Current Stressors:  Patient states their primary concerns and needs for treatment are:: "[place] doctor told me to" Patient states their goals for this hospitilization and ongoing recovery are:: "I want to go home and be with my loved ones" Educational / Learning stressors: Pt denies stressors Employment / Job issues: Pt denies stressors Family Relationships: Pt denies stressors Financial / Lack of resources (include bankruptcy): Pt denies stressors Housing / Lack of housing: Pt denies stressors Physical health (include injuries & life threatening diseases): Pt denies stressors Social relationships: Pt denies stressors Substance abuse: Pt denies stressors. Bereavement / Loss: Pt denies stressors.  Living/Environment/Situation:  Living Arrangements: Townhouse  Living conditions (as described by patient or guardian): "could be better. Landlord does not listen" Who else lives in the home?: Alone. "I want to get a dog" How long has patient lived in current situation?: 1 year  Family History:  Marital status: Single Are you sexually active?: (Pt refuses to answer the question) What is your sexual orientation?: Pt refuses to answer the question Has your sexual activity been affected by drugs, alcohol, medication, or emotional stress?: Pt refuses to answer the question Does patient have children?: No  Childhood History:  By whom was/is the patient raised?: (Pt refuses to answer the question) Description of patient's relationship with caregiver when they were a child: Pt refuses to answer the question Patient's description of current relationship with people who raised him/her: Pt refuses to answer the question How were you disciplined when you got in trouble as a child/adolescent?: Pt refuses to answer the question Does patient  have siblings?: (Pt refuses to answer the question) Did patient suffer any verbal/emotional/physical/sexual abuse as a child?: (Pt refuses to answer the question) Did patient suffer from severe childhood neglect?: (Pt refuses to answer the question) Has patient ever been sexually abused/assaulted/raped as an adolescent or adult?: (Pt refuses to answer the question) Was the patient ever a victim of a crime or a disaster?: (Pt refuses to answer the question) Witnessed domestic violence?: (Pt refuses to answer the question) Has patient been effected by domestic violence as an adult?: (Pt refuses to answer the question)  Education:  Highest grade of school patient has completed: Scientist, water quality (Social Work) Currently a Ship broker?: No Learning disability?: No  Employment/Work Situation:   Employment situation: Employed Where is patient currently employed?: [employer] long has patient been employed?: 1.5 years Patient's job has been impacted by current illness: No What is the longest time patient has a held a job?: Pt refuses to answer the question Where was the patient employed at that time?: Pt refuses to answer the question Did You Receive Any Psychiatric Treatment/Services While in the [employer]?: No Are There Guns or Other Weapons in [place]?: No  Financial Resources:   Financial resources: Income from employment, Private insurance Does patient have a representative payee or guardian?: No  Alcohol/Substance Abuse:   What has been your use of drugs/alcohol within the last 12 months?: "Blue american spirits- nicotine"/ occasionally  If attempted suicide, did drugs/alcohol play a role in this?: No Alcohol/Substance Abuse Treatment Hx: Denies past history Has alcohol/substance abuse ever caused legal problems?: No  Social Support System:   Describe Community Support System: "good/ family and friends" Type of faith/religion: Spiritual  How does patient's faith help to  cope with current illness?: Pt  states she sings   Leisure/Recreation:   Leisure and Hobbies: Exercise   Strengths/Needs:   What is the patient's perception of their strengths?: Plyometric Workouts Patient states they can use these personal strengths during their treatment to contribute to their recovery: "Helps me to become physically fit to be able to do anything" Patient states these barriers may affect/interfere with their treatment: N/A Patient states these barriers may affect their return to the community: N/A Other important information patient would like considered in planning for their treatment: N/A  Discharge Plan:   Currently receiving community mental health services: Yes (From Whom)([facility]) Patient states concerns and preferences for aftercare planning are: [facility]  -Valma Cava (therapist) Patient states they will know when they are safe and ready for discharge when: "When the doctor and social workers tell me that my discharge plan is fine" Does patient have access to transportation?: Yes(personal car) Does patient have financial barriers related to discharge medications?: No Will patient be returning to same living situation after discharge?: Yes(home)  Summary/Recommendations:   Summary and Recommendations (to be completed by the evaluator): Pt is a 28 year old female with paranoia, some unusual ideas of reference, believing that her current psychiatrist had been "receiving messages from her, and sending them to her". She also expresses numerous bizarre delusions but none were particularly formed or organized.The piece of truth regarding "messages" is that she did indeed send text messages to her psychiatrist, Dr. Rene Kocher, that were indicative of a psychotic thought process, and further she had apparently discontinued her oral medications, and the long-acting injectable simply could not hold her symptomatology at bay. Recommendations for pt: crisis stabilization,  therapeutic milieu, medication management, attend and participate in group therapy, and development of a comprehensive mental wellness plan.  Reynold Bowen. 09/16/2019

## 2019-09-16 NOTE — BHH Suicide Risk Assessment (Addendum)
[place] INPATIENT:  Family/Significant Other Suicide Prevention Education  Suicide Prevention Education:  Contact Attempts: Tanner Hausler, friend, [phone] has been identified by the patient as the family member/significant other with whom the patient will be residing, and identified as the person(s) who will aid the patient in the event of a mental health crisis.  With written consent from the patient, two attempts were made to provide suicide prevention education, prior to and/or following the patient's discharge.  We were unsuccessful in providing suicide prevention education.  A suicide education pamphlet was given to the patient to share with family/significant other.  Date and time of first attempt: 10/14 at 3:20 pm  Date and time of second attempt: 10/21 @ 10:50am  Voicemail box was full and SW was unable to leave a message. CSW attempted twice.   Billey Chang 09/16/2019, 3:21 PM

## 2019-09-16 NOTE — Progress Notes (Signed)
Recreation Therapy Notes  Date: 10.14.20 Time: 1000 Location: 500 Hall Dayroom  Group Topic: Wellness  Goal Area(s) Addresses:  Patient will define components of whole wellness. Patient will verbalize benefit of whole wellness.  Behavioral Response:  Engaged  Intervention: Music     Activity:  Exercise. LRT and patients completed a workout routine created by the patients.  The routine consisted of squats, push ups, leg raises, side crunches, v-ups and planks.  Education: Wellness, Dentist.   Education Outcome: Acknowledges education/In group clarification offered/Needs additional education.   Clinical Observations/Feedback:  Pt was active and social with peers.  Pt appeared extra excited during activity.  Pt would go over and beyond with the exercises by completing way more repetitions than she had to.      Victorino Sparrow, LRT/CTRS      Ria Comment, Azelea Seguin A 09/16/2019 10:55 AM

## 2019-09-16 NOTE — Progress Notes (Signed)
Patient ID: Lisa [name], female   DOB: 01/11/1991, 28 y.o.   MRN: [id number]  CSW met with patient to discuss what her psychiatrist stated about her needing to do a PHP or IOP program. Patient agreed to do the [facility] PHP program. CSW informed patient that CSW will inform her psychiatrist and will get her the paperwork for PHP on the following day. Patient was pleasant.

## 2019-09-17 DIAGNOSIS — F250 Schizoaffective disorder, bipolar type: Secondary | ICD-10-CM | POA: Diagnosis not present

## 2019-09-17 MED ORDER — PERPHENAZINE 8 MG PO TABS
8.0000 mg | ORAL_TABLET | Freq: Two times a day (BID) | ORAL | Status: DC
  Administered 2019-09-17 – 2019-09-19 (×4): 8 mg via ORAL
  Filled 2019-09-17 (×8): qty 1

## 2019-09-17 MED ORDER — PERPHENAZINE 4 MG PO TABS
12.0000 mg | ORAL_TABLET | Freq: Every day | ORAL | Status: DC
  Administered 2019-09-17 – 2019-09-18 (×2): 12 mg via ORAL
  Filled 2019-09-17 (×4): qty 3

## 2019-09-17 MED ORDER — NICOTINE POLACRILEX 2 MG MT GUM
2.0000 mg | CHEWING_GUM | OROMUCOSAL | Status: DC | PRN
  Administered 2019-09-18 – 2019-09-28 (×14): 2 mg via ORAL
  Filled 2019-09-17 (×10): qty 1

## 2019-09-17 MED ORDER — CARBAMAZEPINE 100 MG PO CHEW: 200.0000 mg | CHEWABLE_TABLET | Freq: Three times a day (TID) | ORAL | Status: DC

## 2019-09-17 MED ORDER — BACITRACIN-NEOMYCIN-POLYMYXIN OINTMENT TUBE
TOPICAL_OINTMENT | CUTANEOUS | Status: DC | PRN
  Filled 2019-09-17: qty 14.17

## 2019-09-17 NOTE — Progress Notes (Signed)
Patient ID: Lisa [name], female   DOB: February 01, 1991, 28 y.o.   MRN: [id number]   CSW sent information to [place] outpatient regarding the patient wanting PHP.

## 2019-09-17 NOTE — Progress Notes (Signed)
D: Pt pleasant on the unit, no oppositional behavior A: Pt was offered support and encouragement. Pt was given scheduled medications. Pt was encourage to attend groups. Q 15 minute checks were done for safety.  R:Pt attends groups and interacts well with peers and staff. Pt is taking medication. Pt has no complaints.Pt receptive to treatment and safety maintained on unit.

## 2019-09-17 NOTE — [facility] Group Notes (Signed)
LCSW Group Therapy Note  Date and Time: 09/17/2019 @ 1:30pm  Type of Therapy and Topic: Group Therapy: Feelings Around Returning Home & Establishing a Supportive Framework and Supporting Oneself When Supports Not Available  Participation Level: [facility] PARTICIPATION LEVEL: Active  Mood: Pleasant    Description of Group:  Patients first processed thoughts and feelings about upcoming discharge. These included fears of upcoming changes, lack of change, new living environments, judgements and expectations from others and overall stigma of mental health issues. The group then discussed the definition of a supportive framework, what that looks and feels like, and how do to discern it from an unhealthy non-supportive network. The group identified different types of supports as well as what to do when your family/friends are less than helpful or unavailable     Therapeutic Goals   1.  Patient will identify one healthy supportive network that they can use at discharge.  2.  Patient will identify one factor of a supportive framework and how to tell it from an unhealthy network.  3.  Patient able to identify one coping skill to use when they do not have positive supports from others.  4.  Patient will demonstrate ability to communicate their needs through discussion and/or role plays.     Summary of Patient Progress:     Patient was active and engaged throughout group therapy today. Patient was pleasant. Patient was able to identify her healthy supportive networks as family and friends. Patient was able to identify why its hard for her to connect with her unhealthy supportive networks which is drugs/alcohol and her own trauma/PTSD. Patient reports that she can tell a supportive network by them physically being there and checking in on her. Patient reports that when she gets out that she will teach her supports how to be there for her and obtain a pet.      Therapeutic Modalities  Cognitive  Behavioral Therapy  Motivational Interviewing   Ardelle Anton, MSW, [place]

## 2019-09-17 NOTE — Progress Notes (Signed)
Pt up yo the nursing station numerous times through the night making irrelevant request. Pt wanting to talk about different things that can be discussed in the morning. Pt appeared to be trying to not go to sleep, pt requested more Restoril, pt informed she could have Ativan "I'm not taking Restoril"

## 2019-09-17 NOTE — [facility] Group Notes (Signed)
Occupational Therapy Group Note  Date:  09/17/2019 Time:  4:16 PM  Group Topic/Focus:  Self Esteem Action Plan:   The focus of this group is to help patients create a plan to continue to build self-esteem after discharge.  Participation Level:  Active  Participation Quality:  Monopolizing and Resistant  Affect:  Irritable  Cognitive:  Disorganized  Insight: Lacking  Engagement in Group:  Distracting, Monopolizing, Off Topic and Resistant  Modes of Intervention:  Activity, Discussion, Education and Socialization  Additional Comments:    O: OT tx with focus on self esteem building this date. Education given on definition of self esteem, with both causes of low and high self esteem identified. Activity given for pt to identify a positive/aspiring trait for each letter of the alphabet. Pt to work with peers to help complete activity and build positive thinking.   A: Pt presents with irritable and labile affect, off topic and monopolizing during group. Pt had many attention seeking behaviors throughout irritable and targeting OT. She was fixated on where OT went to school, insisting that OT is lying (thinking she is another staff member). Pt then talking "to dad" on the phone but phones were turned off at this time. She was loudly stating "this girl running group just doesn't get it. Can you believe she is trying to turn the phones off". Pt then interrupting and asking why OT is not running group in certain fashion, attempting to run group on her own terms. When pt asked if she had anything to add she states "oh no I am not doing this group". She then went and sat in fetal position on the floor and stated to OT "oh my god are you schizophrenic? No one else can see me but you. People are going to think you are crazy because you are seeing things. You are crazy. Guys can you believe this, she's trying to teach [place] things while she is seeing things". Pt then sharing how she went to school for social  work and knows all of the curriculum OT has to provide. When given self esteem activity, pt stated "how cute it's a simple alphabet activity". When redirected to rules of activity, pt threw paper down and began screaming profanities at OT at top of her lungs. Pt then escorted out by staff and asked to to return to group. She continued to hang around the locked door as attention seeking through the glass. She kept yelling "ultraviolet" to OT whom wears a purple uniform. When group was over she states "thank god ultraviolet is out of there now I can use the phone". At this time pt is not able to maintain attention to tasks for productive learning of coping skills.  Per chart review, noted referral in progress for PHP. This OT is a team member of PHP. Given current symptoms and disorganization, pt is not appropriate candidate for these services at this time. Pt would need inpatient PHP or PHP that is more catered to psychotic disorders to manage behaviors pt continues to acutely experience.   P: OT group will be x1 per week while pt in [place], MSOT, OTR/L [place] Office: [place] 09/17/2019, 4:16 PM

## 2019-09-17 NOTE — Progress Notes (Signed)
Pt woke up very pleasant and appropriate . Pt informed staff that today is her birthday.

## 2019-09-17 NOTE — Progress Notes (Signed)
Pt coming to the nursing station stating she can not sleep asking irrelevant questions" what's the name of the [place]" pt informed that we can discuss that at 6 am. Pt not trying to lay down in her room .  Pt refused medication offered.

## 2019-09-17 NOTE — Progress Notes (Signed)
The patient shared in group that her positive event for the day were her "intentions". She did not go into further detail. She did verbalize that she is feeling better and is ready for discharge. Her goal for tomorrow is to get discharged.

## 2019-09-17 NOTE — Progress Notes (Signed)
[facility] MD Progress Note  09/17/2019 10:50 AM Lisa [name]  MRN:  [id number] Subjective:    Remains a little bit intrusive and hypomanic but no screaming behaviors today generally compliant with meds except for clonazepam.  Alert oriented and cooperative redirectable ramble some very focused on discharge, no EPS or TD. Still makes random statements and engages staff with these random questions.  Principal Problem: Manic symptoms Diagnosis: Active Problems:   Schizoaffective disorder, bipolar type ([place])  Total Time spent with patient: 20 minutes  Past Psychiatric History: see eval  Past Medical History:  Past Medical History:  Diagnosis Date  . Anxiety     Past Surgical History:  Procedure Laterality Date  . extraction of wisdom teeth     Family History:  Family History  Problem Relation Age of Onset  . Schizophrenia Mother   . Depression Father   . Seizures Sister    Family Psychiatric  History: see eval Social History:  Social History   Substance and Sexual Activity  Alcohol Use Yes   Comment: casually     Social History   Substance and Sexual Activity  Drug Use No  . Types: Marijuana   Comment: THC last used 2 yrs ago, unsure of amount    Social History   Socioeconomic History  . Marital status: Single    Spouse name: Not on file  . Number of children: Not on file  . Years of education: Not on file  . Highest education level: Not on file  Occupational History  . Not on file  Social Needs  . Financial resource strain: Not on file  . Food insecurity    Worry: Not on file    Inability: Not on file  . Transportation needs    Medical: Not on file    Non-medical: Not on file  Tobacco Use  . Smoking status: Former Smoker    Packs/day: 0.25    Years: 0.50    Pack years: 0.12    Types: Cigarettes    Quit date: 09/03/2015    Years since quitting: 4.0  . Smokeless tobacco: Never Used  Substance and Sexual Activity  . Alcohol use: Yes    Comment:  casually  . Drug use: No    Types: Marijuana    Comment: THC last used 2 yrs ago, unsure of amount  . Sexual activity: Not Currently    Comment: Plan B Pill  Lifestyle  . Physical activity    Days per week: Not on file    Minutes per session: Not on file  . Stress: Not on file  Relationships  . Social Herbalist on phone: Not on file    Gets together: Not on file    Attends religious service: Not on file    Active member of club or organization: Not on file    Attends meetings of clubs or organizations: Not on file    Relationship status: Not on file  Other Topics Concern  . Not on file  Social History Narrative  . Not on file   Additional Social History:    Pain Medications: see MAR Prescriptions: see MAR Over the Counter: see MAR History of alcohol / drug use?: No history of alcohol / drug abuse                    Sleep: Good  Appetite:  Good  Current Medications: Current Facility-Administered Medications  Medication Dose Route Frequency Provider Last Rate  Last Dose  . carbamazepine (TEGRETOL) chewable tablet 100 mg  100 mg Oral TID Malvin Johns, MD   100 mg at 09/17/19 0804  . carvedilol (COREG) tablet 12.5 mg  12.5 mg Oral BID WC Malvin Johns, MD   12.5 mg at 09/17/19 0804  . clonazePAM (KLONOPIN) tablet 2 mg  2 mg Oral Once Malvin Johns, MD      . haloperidol lactate (HALDOL) injection 10 mg  10 mg Intramuscular Q6H PRN Malvin Johns, MD      . lithium carbonate (ESKALITH) CR tablet 450 mg  450 mg Oral Q12H Malvin Johns, MD   450 mg at 09/17/19 0804  . LORazepam (ATIVAN) tablet 2 mg  2 mg Oral Q4H PRN Malvin Johns, MD       Or  . LORazepam (ATIVAN) injection 2 mg  2 mg Intramuscular Q4H PRN Malvin Johns, MD      . nicotine polacrilex (NICORETTE) gum 2 mg  2 mg Oral PRN Jearld Lesch, NP      . perphenazine (TRILAFON) tablet 12 mg  12 mg Oral QHS Malvin Johns, MD      . perphenazine (TRILAFON) tablet 8 mg  8 mg Oral BID Malvin Johns, MD      .  temazepam (RESTORIL) capsule 30 mg  30 mg Oral QHS Malvin Johns, MD   30 mg at 09/16/19 2103    Lab Results: No results found for this or any previous visit (from the past 48 hour(s)).  Blood Alcohol level:  Lab Results  Component Value Date   ETH <10 09/15/2019   ETH <10 08/31/2019    Metabolic Disorder Labs: Lab Results  Component Value Date   HGBA1C 5.3 09/15/2019   MPG 105.41 09/15/2019   MPG 103 05/14/2015   Lab Results  Component Value Date   PROLACTIN 16.5 09/15/2019   PROLACTIN 18.6 06/10/2015   Lab Results  Component Value Date   CHOL 210 (H) 09/15/2019   TRIG 97 09/15/2019   HDL 60 09/15/2019   CHOLHDL 3.5 09/15/2019   VLDL 19 09/15/2019   LDLCALC 131 (H) 09/15/2019   LDLCALC 77 05/14/2015    Physical Findings: AIMS:  , ,  ,  ,    CIWA:    COWS:     Musculoskeletal: Strength & Muscle Tone: within normal limits Gait & Station: normal Patient leans: N/A  Psychiatric Specialty Exam: Physical Exam  ROS  Blood pressure 119/78, pulse 100, temperature (!) 97.5 F (36.4 C), resp. rate 16, SpO2 98 %.There is no height or weight on file to calculate BMI.  General Appearance: Casual  Eye Contact:  Fair  Speech:  Clear and Coherent  Volume:  Increased  Mood:  Hypomanic but behaviorally contained  Affect:  Congruent  Thought Process:  Goal Directed and Descriptions of Associations: Loose  Orientation:  Full (Time, Place, and Person)  Thought Content:  Illogical  Suicidal Thoughts:  No  Homicidal Thoughts:  No  Memory:  Recent;   Fair Remote;   Fair  Judgement:  Fair  Insight:  Fair  Psychomotor Activity:  Normal  Concentration:  Concentration: Fair and Attention Span: Fair  Recall:  [employer] of Knowledge:  Fair  Language:  Fair  Akathisia:  Negative  Handed:  Right  AIMS (if indicated):     Assets:  Intimacy Leisure Time Physical Health  ADL's:  Intact  Cognition:  WNL  Sleep:  Number of Hours: 4.75     Treatment Plan Summary: Daily  contact with patient to assess and evaluate symptoms and progress in treatment and Medication management  Continue perphenazine but escalate to 8 mg twice a day and 12 at bedtime continue to monitor for safety continue lithium check level in the morning no change in precautions  Nakeem Murnane, MD 09/17/2019, 10:50 AM

## 2019-09-17 NOTE — Progress Notes (Signed)
Recreation Therapy Notes  Date: 10.15.20 Time: 1000 Location:  500 Hall Dayroom  Group Topic: Communication, Team Building, Problem Solving  Goal Area(s) Addresses:  Patient will effectively work with peer towards shared goal.  Patient will identify skills used to make activity successful.  Patient will identify how skills used during activity can be used to reach post d/c goals.   Behavioral Response: Engaged  Intervention: STEM Activity  Activity: Geophysicist/field seismologist. In teams patients were given 12 plastic drinking straws and a length of masking tape. Using the materials provided patients were asked to build a landing pad to catch a golf ball dropped from approximately 6 feet in the air.   Education: Education officer, community, Discharge Planning   Education Outcome: Acknowledges education/In group clarification offered/Needs additional education.   Clinical Observations/Feedback:  Pt was focused and active.  Pt was able to help with the concept and implement it.  Pt was appropriate during group session.     Victorino Sparrow, LRT/CTRS     Victorino Sparrow A 09/17/2019 11:55 AM

## 2019-09-18 DIAGNOSIS — F250 Schizoaffective disorder, bipolar type: Secondary | ICD-10-CM | POA: Diagnosis not present

## 2019-09-18 LAB — LITHIUM LEVEL: Lithium Lvl: 0.45 mmol/L — ABNORMAL LOW (ref 0.60–1.20)

## 2019-09-18 MED ORDER — LITHIUM CARBONATE ER 300 MG PO TBCR
600.0000 mg | EXTENDED_RELEASE_TABLET | Freq: Two times a day (BID) | ORAL | Status: DC
  Administered 2019-09-18 – 2019-10-04 (×23): 600 mg via ORAL
  Administered 2019-10-04: 300 mg via ORAL
  Filled 2019-09-18 (×36): qty 2
  Filled 2019-09-18: qty 1
  Filled 2019-09-18 (×3): qty 2

## 2019-09-18 NOTE — Progress Notes (Signed)
MSW intern contacted [employer] to check if they accept pt's insurance. At this time, ACTT team only accepts medicaid.   Ovidio Kin, MSW intern

## 2019-09-18 NOTE — Progress Notes (Signed)
D: Pt denies SI/HI/AVH. Pt is pleasant and cooperative most of the evening. Pt pacing the unit appearing to get agitated at times.   A: Pt was offered support and encouragement. Pt was given scheduled medications. Pt was encourage to attend groups. Q 15 minute checks were done for safety.   R:Pt attends groups and interacts well with peers and staff. Pt is taking medication. Pt receptive to treatment and safety maintained on unit.

## 2019-09-18 NOTE — Progress Notes (Signed)
[facility]  09/18/2019 3:29 PM Lisa AbedCorina J [name]  MRN:  [id number] Subjective:    Patient is much more contained behaviorally she still has racing thoughts and will still begin singing spontaneously but not loudly.  She is much more contained in mood she does tend to walk around the ward though but she has no akathisia.  She denies no EPS.  No thoughts of harming self or others.  Her lithium level was slightly subtherapeutic [place] escalated by 300 mg so she will be on 600 mg twice daily.  No change in antipsychotic therapy.  Exploring act team options. Principal Problem: Exacerbation of underlying bipolar condition Diagnosis: Active Problems:   Schizoaffective disorder, bipolar type ([facility])  Total Time spent with patient: 20 minutes  Past Psychiatric History: Again recently here but relapsed quickly  Past Medical History:  Past Medical History:  Diagnosis Date  . Anxiety     Past Surgical History:  Procedure Laterality Date  . extraction of wisdom teeth     Family History:  Family History  Problem Relation Age of Onset  . Schizophrenia Mother   . Depression Father   . Seizures Sister    Family Psychiatric  History: No new data Social History:  Social History   Substance and Sexual Activity  Alcohol Use Yes   Comment: casually     Social History   Substance and Sexual Activity  Drug Use No  . Types: Marijuana   Comment: THC last used 2 yrs ago, unsure of amount    Social History   Socioeconomic History  . Marital status: Single    Spouse name: Not on file  . Number of children: Not on file  . Years of education: Not on file  . Highest education level: Not on file  Occupational History  . Not on file  Social Needs  . Financial resource strain: Not on file  . Food insecurity    Worry: Not on file    Inability: Not on file  . Transportation needs    Medical: Not on file    Non-medical: Not on file  Tobacco Use  . Smoking status: Former Smoker   Packs/day: 0.25    Years: 0.50    Pack years: 0.12    Types: Cigarettes    Quit date: 09/03/2015    Years since quitting: 4.0  . Smokeless tobacco: Never Used  Substance and Sexual Activity  . Alcohol use: Yes    Comment: casually  . Drug use: No    Types: Marijuana    Comment: THC last used 2 yrs ago, unsure of amount  . Sexual activity: Not Currently    Comment: Plan B Pill  Lifestyle  . Physical activity    Days per week: Not on file    Minutes per session: Not on file  . Stress: Not on file  Relationships  . Social Musicianconnections    Talks on phone: Not on file    Gets together: Not on file    Attends religious service: Not on file    Active member of club or organization: Not on file    Attends meetings of clubs or organizations: Not on file    Relationship status: Not on file  Other Topics Concern  . Not on file  Social History Narrative  . Not on file   Additional Social History:    Pain Medications: see MAR Prescriptions: see MAR Over the Counter: see MAR History of alcohol / drug use?:  No history of alcohol / drug abuse                    Sleep: Good  Appetite:  Good  Current Medications: Current Facility-Administered Medications  Medication Dose Route Frequency Provider Last Rate Last Dose  . carvedilol (COREG) tablet 12.5 mg  12.5 mg Oral BID WC [name], MD   12.5 mg at 09/18/19 0815  . clonazePAM (KLONOPIN) tablet 2 mg  2 mg Oral Once [name], MD      . haloperidol lactate (HALDOL) injection 10 mg  10 mg Intramuscular Q6H PRN [name], MD   10 mg at 09/17/19 1339  . lithium carbonate (LITHOBID) CR tablet 600 mg  600 mg Oral Q12H [name], MD      . LORazepam (ATIVAN) tablet 2 mg  2 mg Oral Q4H PRN [name], MD       Or  . LORazepam (ATIVAN) injection 2 mg  2 mg Intramuscular Q4H PRN [name], MD   2 mg at 09/17/19 1339  . neomycin-bacitracin-polymyxin (NEOSPORIN) ointment   Topical PRN [name], NP      .  nicotine polacrilex (NICORETTE) gum 2 mg  2 mg Oral PRN [name], NP      . perphenazine (TRILAFON) tablet 12 mg  12 mg Oral QHS [name], MD   12 mg at 09/17/19 2059  . perphenazine (TRILAFON) tablet 8 mg  8 mg Oral BID [name], MD   8 mg at 09/18/19 0815  . temazepam (RESTORIL) capsule 30 mg  30 mg Oral QHS [name], MD   30 mg at 09/17/19 2100    Lab Results:  Results for orders placed or performed during the hospital encounter of 09/14/19 (from the past 48 hour(s))  Lithium level     Status: Abnormal   Collection Time: 09/18/19  6:24 AM  Result Value Ref Range   Lithium Lvl 0.45 (L) 0.60 - 1.20 mmol/L    Comment: Performed at [facility], [place] [address], [place], [place] [address]    Blood Alcohol level:  Lab Results  Component Value Date   [facility] <10 09/15/2019   ETH <10 09/18/5101    Metabolic Disorder Labs: Lab Results  Component Value Date   HGBA1C 5.3 09/15/2019   MPG 105.41 09/15/2019   MPG 103 05/14/2015   Lab Results  Component Value Date   PROLACTIN 16.5 09/15/2019   PROLACTIN 18.6 06/10/2015   Lab Results  Component Value Date   CHOL 210 (H) 09/15/2019   TRIG 97 09/15/2019   HDL 60 09/15/2019   CHOLHDL 3.5 09/15/2019   VLDL 19 09/15/2019   LDLCALC 131 (H) 09/15/2019   LDLCALC 77 05/14/2015    Physical Findings: AIMS: Facial and Oral Movements Muscles of Facial Expression: None, normal Lips and Perioral Area: None, normal Jaw: None, normal Tongue: None, normal,Extremity Movements Upper (arms, wrists, hands, fingers): None, normal Lower (legs, knees, ankles, toes): None, normal, Trunk Movements Neck, shoulders, hips: None, normal, Overall Severity Severity of abnormal movements (highest score from questions above): None, normal Incapacitation due to abnormal movements: None, normal Patient's awareness of abnormal movements (rate only patient's report): No Awareness, Dental Status Current problems with teeth  and/or dentures?: No Does patient usually wear dentures?: No  CIWA:    COWS:     Musculoskeletal: Strength & Muscle Tone: within normal limits Gait & Station: normal Patient leans: N/A  Psychiatric Specialty Exam: Physical Exam  ROS  Blood pressure 110/68, pulse 88, temperature 98 F (36.7 C), resp. rate 18, SpO2 98 %.There is no height or weight on file to calculate BMI.  General Appearance: Casual  Eye Contact:  Good  Speech:  Clear and Coherent  Volume:  Increased  Mood:  Hypomanic to euthymic at intervals  Affect:  Congruent  Thought Process:  Goal Directed and Descriptions of Associations: Circumstantial  Orientation:  Full (Time, Place, and Person)  Thought Content:  Denies auditory or visual hallucinations expresses no delusional material denies thoughts of harming self or others  Suicidal Thoughts:  No  Homicidal Thoughts:  No    Judgement:  Fair  Insight:  Good  Psychomotor Activity:  Normal  Concentration:  Concentration: Good and Attention Span: Good  Recall:  Good  Fund of Knowledge:  Good  Language:  Good  Akathisia:  Negative  Handed:  Right  AIMS (if indicated):     Assets:  Resilience Social Support  ADL's:  Intact  Cognition:  WNL  Sleep:  Number of Hours: 8.5  Memory tested generally intact 3 of 3 and 2 of 3 and remote memory seems intact   Treatment Plan Summary: Daily contact with patient to assess and evaluate symptoms and progress in treatment and Medication management For bipolar disorder continue antipsychotic but also escalate lithium carbonate by 300 mg due to slightly low/slightly subtherapeutic blood level continue reality based therapy cognitive therapy continue current precautions monitor for the weekend [facility], MD 09/18/2019, 3:29 PM

## 2019-09-18 NOTE — Progress Notes (Signed)
Recreation Therapy Notes  Date: 10.16.20 Time: 1015 Location: 500 Hall Dayroom  Group Topic: Self-Esteem  Goal Area(s) Addresses:  Patient will successfully identify positive attributes about themselves.  Patient will successfully identify benefit of improved self-esteem.   Behavioral Response: Engaged  Intervention: Markers, colored pencils, construction paper, outline of license plate, music  Activity: Personalized Plate.  Patients were to create a personalized licensed plate that highlighted all of the positive qualities about them and anything that makes them unique.  Education:  Self-Esteem, Dentist.   Education Outcome: Acknowledges education/In group clarification offered/Needs additional education  Clinical Observations/Feedback: Pt was engaged and social with peers.  Pt also sang along to the music in group. Pt used a lot of colors to describe herself.  Pt also incorporated a sunset and trees because she likes nature.   Victorino Sparrow, LRT/CTRS         Victorino Sparrow A 09/18/2019 12:29 PM

## 2019-09-18 NOTE — Progress Notes (Addendum)
Spirituality group facilitated by Simone Curia, MDiv, [facility].  Group Description:  Group focused on topic of hope.  Patients participated in facilitated discussion around topic, connecting with one another around experiences and definitions for hope.  Group members engaged with the sentence "hope is," reflecting on what hope looks like for them today.  Group engaged in discussion around how their definitions of hope are present today in hospital.   Modalities: Psycho-social ed, Adlerian, Narrative, MI Patient Progress:  Dinara was in and out of group room.  When engaging, she was generally tangential.  Displayed some vigilance toward this chaplain - asking specific questions such as "do you believe in hell."   She did not respond to group topic.

## 2019-09-19 DIAGNOSIS — F250 Schizoaffective disorder, bipolar type: Principal | ICD-10-CM

## 2019-09-19 MED ORDER — PERPHENAZINE 4 MG PO TABS
12.0000 mg | ORAL_TABLET | Freq: Three times a day (TID) | ORAL | Status: DC
  Administered 2019-09-19: 8 mg via ORAL
  Filled 2019-09-19 (×9): qty 3

## 2019-09-19 NOTE — Progress Notes (Signed)
[facility] Group Notes:  (Nursing/MHT/Case Management/Adjunct)  Date:  09/19/2019  Time:  0900   Type of Therapy:  Nurse Education  Participation Level:  Active  Participation Quality:  Appropriate  Affect:  Appropriate  Cognitive:  Appropriate  Insight:  Appropriate  Engagement in Group:  Improving  Modes of Intervention:  Activity, Discussion and Education  Summary of Progress/Problems: Discussed anger management.  

## 2019-09-19 NOTE — Progress Notes (Signed)
Patient ID: Denim J Klasen, female   DOB: 12/18/1990, 28 y.o.   MRN: [id number]    [place] NOVEL CORONAVIRUS (COVID-19) DAILY CHECK-OFF SYMPTOMS - answer yes or no to each - every day NO YES  Have you had a fever in the past 24 hours?  . Fever (Temp > 37.80C / 100F) X   Have you had any of these symptoms in the past 24 hours? . New Cough .  Sore Throat  .  Shortness of Breath .  Difficulty Breathing .  Unexplained Body Aches   X   Have you had any one of these symptoms in the past 24 hours not related to allergies?   . Runny Nose .  Nasal Congestion .  Sneezing   X   If you have had runny nose, nasal congestion, sneezing in the past 24 hours, has it worsened?  X   EXPOSURES - check yes or no X   Have you traveled outside the state in the past 14 days?  X   Have you been in contact with someone with a confirmed diagnosis of COVID-19 or PUI in the past 14 days without wearing appropriate PPE?  X   Have you been living in the same home as a person with confirmed diagnosis of COVID-19 or a PUI (household contact)?    X   Have you been diagnosed with COVID-19?    X              What to do next: Answered NO to all: Answered YES to anything:   Proceed with unit schedule Follow the [facility] Inpatient Flowsheet.   

## 2019-09-19 NOTE — [facility] Group Notes (Signed)
LCSW Group Therapy Note  09/19/2019   11:15am-12:00pm   Type of Therapy and Topic:  Group Therapy: Anger   Participation Level:  Minimal   Description of Group:   In this group, patients identified a recent time they became angry and how they reacted.  They analyzed how their reaction was possibly beneficial and how it was possibly unhelpful.  They learned how to recognize the physical responses they have to anger-provoking situations that can alert them to the possibility that they are becoming angry.  They also discussed how anger often arises from conflict with our value system, which is different from one person to another.  Therapeutic Goals: 1. Patients will remember their last incident of anger and how they felt emotionally and physically. 2. Patients will identify how their behavior at that time worked for them, as well as how it worked against them. 3. Patients will explore the idea that people's value systems differ and can affect anger 4. Patients will learn that anger itself is normal and is "a sign something needs to change."  Summary of Patient Progress:  The patient shared that her most recent time of anger was when she found out about "all the dead people from the virus" and got up and left the room.  Therapeutic Modalities:   Cognitive Behavioral Therapy  Maretta Los, LCSW

## 2019-09-19 NOTE — Plan of Care (Signed)
  Problem: Activity: Goal: Interest or engagement in activities will improve Outcome: Progressing   Problem: Coping: Goal: Ability to verbalize frustrations and anger appropriately will improve Outcome: Progressing Goal: Ability to demonstrate self-control will improve Outcome: Progressing   

## 2019-09-19 NOTE — Progress Notes (Addendum)
[facility]  09/19/2019 1:21 PM Lisa AbedCorina J [name]  MRN:  [id number] Subjective:    Lisa [name] continues to present with hypomania this morning, singing loudly, walking around the unit, and frequently intrusive with other patients' care. Per nursing report she is agitated at times. She became acutely agitated immediately on assessment this afternoon and yelled loudly, "Why do you people keep asking me all these questions? I shouldn't have to answer this!" before walking out of the office. Agitation appeared to be partly related to another agitated patient on the unit who was requiring IM medications at that time. Patient was seen in the hallway ~15 minutes later and apologized for yelling at that time- "I just get angry sometimes." She is expansive and intrusive. She denies SI/HI/AVH. 4 hours of sleep recorded overnight. Lithium was increased yesterday due to subtherapeutic levels.  From admission H&P: This is the sixth psychiatric admission for Lisa [name], and she was recently here from 9/28 to 10/6-thus she is admitted exactly 1 week after her last discharge. Despite long-acting injectable aripiprazole administered on 10/2 at 400 mg IM, as well as lithium and Risperdal at 8 mg at bedtime, the patient presented on 10/12 with paranoia, some unusual ideas of reference, believing that her current psychiatrist had been "receiving messages from her, and sending them to her"  Principal Problem: <principal problem not specified> Diagnosis: Active Problems:   Schizoaffective disorder, bipolar type (HCC)  Total Time spent with patient: 15 minutes  Past Psychiatric History: See admission H&P  Past Medical History:  Past Medical History:  Diagnosis Date  . Anxiety     Past Surgical History:  Procedure Laterality Date  . extraction of wisdom teeth     Family History:  Family History  Problem Relation Age of Onset  . Schizophrenia Mother   . Depression Father   . Seizures Sister    Family  Psychiatric  History: See admission H&P Social History:  Social History   Substance and Sexual Activity  Alcohol Use Yes   Comment: casually     Social History   Substance and Sexual Activity  Drug Use No  . Types: Marijuana   Comment: THC last used 2 yrs ago, unsure of amount    Social History   Socioeconomic History  . Marital status: Single    Spouse name: Not on file  . Number of children: Not on file  . Years of education: Not on file  . Highest education level: Not on file  Occupational History  . Not on file  Social Needs  . Financial resource strain: Not on file  . Food insecurity    Worry: Not on file    Inability: Not on file  . Transportation needs    Medical: Not on file    Non-medical: Not on file  Tobacco Use  . Smoking status: Former Smoker    Packs/day: 0.25    Years: 0.50    Pack years: 0.12    Types: Cigarettes    Quit date: 09/03/2015    Years since quitting: 4.0  . Smokeless tobacco: Never Used  Substance and Sexual Activity  . Alcohol use: Yes    Comment: casually  . Drug use: No    Types: Marijuana    Comment: THC last used 2 yrs ago, unsure of amount  . Sexual activity: Not Currently    Comment: Plan B Pill  Lifestyle  . Physical activity    Days per week: Not on file  Minutes per session: Not on file  . Stress: Not on file  Relationships  . Social Herbalist on phone: Not on file    Gets together: Not on file    Attends religious service: Not on file    Active member of club or organization: Not on file    Attends meetings of clubs or organizations: Not on file    Relationship status: Not on file  Other Topics Concern  . Not on file  Social History Narrative  . Not on file   Additional Social History:    Pain Medications: see MAR Prescriptions: see MAR Over the Counter: see MAR History of alcohol / drug use?: No history of alcohol / drug abuse                    Sleep: Good  Appetite:   Good  Current Medications: Current Facility-Administered Medications  Medication Dose Route Frequency Provider Last Rate Last Dose  . carvedilol (COREG) tablet 12.5 mg  12.5 mg Oral BID WC [name], MD   12.5 mg at 09/19/19 0734  . clonazePAM (KLONOPIN) tablet 2 mg  2 mg Oral Once [name], MD      . haloperidol lactate (HALDOL) injection 10 mg  10 mg Intramuscular Q6H PRN [name], MD   10 mg at 09/17/19 1339  . lithium carbonate (LITHOBID) CR tablet 600 mg  600 mg Oral Q12H [name], MD   600 mg at 09/19/19 0734  . LORazepam (ATIVAN) tablet 2 mg  2 mg Oral Q4H PRN [name], MD       Or  . LORazepam (ATIVAN) injection 2 mg  2 mg Intramuscular Q4H PRN [name], MD   2 mg at 09/17/19 1339  . neomycin-bacitracin-polymyxin (NEOSPORIN) ointment   Topical PRN [name], NP      . nicotine polacrilex (NICORETTE) gum 2 mg  2 mg Oral PRN [name], NP   2 mg at 09/19/19 0734  . perphenazine (TRILAFON) tablet 12 mg  12 mg Oral TID [name], NP      . temazepam (RESTORIL) capsule 30 mg  30 mg Oral QHS [name], MD   30 mg at 09/18/19 2115    Lab Results:  Results for orders placed or performed during the hospital encounter of 09/14/19 (from the past 48 hour(s))  Lithium level     Status: Abnormal   Collection Time: 09/18/19  6:24 AM  Result Value Ref Range   Lithium Lvl 0.45 (L) 0.60 - 1.20 mmol/L    Comment: Performed at [facility], [place] [address], [place], [place] [address]    Blood Alcohol level:  Lab Results  Component Value Date   [facility] <10 09/15/2019   ETH <10 54/27/0623    Metabolic Disorder Labs: Lab Results  Component Value Date   HGBA1C 5.3 09/15/2019   MPG 105.41 09/15/2019   MPG 103 05/14/2015   Lab Results  Component Value Date   PROLACTIN 16.5 09/15/2019   PROLACTIN 18.6 06/10/2015   Lab Results  Component Value Date   CHOL 210 (H) 09/15/2019   TRIG 97 09/15/2019   HDL 60 09/15/2019   CHOLHDL 3.5  09/15/2019   VLDL 19 09/15/2019   LDLCALC 131 (H) 09/15/2019   LDLCALC 77 05/14/2015    Physical Findings: AIMS: Facial and Oral Movements Muscles of Facial Expression: None, normal Lips and Perioral Area: None, normal Jaw: None, normal Tongue:  None, normal,Extremity Movements Upper (arms, wrists, hands, fingers): None, normal Lower (legs, knees, ankles, toes): None, normal, Trunk Movements Neck, shoulders, hips: None, normal, Overall Severity Severity of abnormal movements (highest score from questions above): None, normal Incapacitation due to abnormal movements: None, normal Patient's awareness of abnormal movements (rate only patient's report): No Awareness, Dental Status Current problems with teeth and/or dentures?: No Does patient usually wear dentures?: No  CIWA:    COWS:     Musculoskeletal: Strength & Muscle Tone: within normal limits Gait & Station: normal Patient leans: N/A  Psychiatric Specialty Exam: Physical Exam  Nursing note and vitals reviewed. Constitutional: She is oriented to person, place, and time. She appears well-developed and well-nourished.  Cardiovascular: Normal rate.  Respiratory: Effort normal.  Neurological: She is alert and oriented to person, place, and time.    Review of Systems  Constitutional: Negative.   Respiratory: Negative for cough and shortness of breath.   Cardiovascular: Negative for chest pain.  Psychiatric/Behavioral: Negative for depression, hallucinations, substance abuse and suicidal ideas. The patient is nervous/anxious. The patient does not have insomnia.     Blood pressure 121/82, pulse 93, temperature 97.7 F (36.5 C), resp. rate 18, SpO2 98 %.There is no height or weight on file to calculate BMI.  General Appearance: Casual  Eye Contact:  Good  Speech:  Pressured  Volume:  Increased  Mood:  Angry, Anxious and Irritable  Affect:  Labile  Thought Process:  Coherent  Orientation:  Full (Time, Place, and Person)   Thought Content:  Logical  Suicidal Thoughts:  No  Homicidal Thoughts:  No  Memory:  Immediate;   Fair Recent;   Fair  Judgement:  Impaired  Insight:  Lacking  Psychomotor Activity:  Normal  Concentration:  Concentration: Poor and Attention Span: Poor  Recall:  Fair  Fund of Knowledge:  Good  Language:  Good  Akathisia:  No  Handed:  Right  AIMS (if indicated):     Assets:  Communication Skills Desire for Improvement Financial Resources/Insurance Housing Resilience Vocational/Educational  ADL's:  Intact  Cognition:  WNL  Sleep:  Number of Hours: 4     Treatment Plan Summary: Daily contact with patient to assess and evaluate symptoms and progress in treatment and Medication management   Continue inpatient hospitalization.  Increase perphenazine to 12 mg PO TID for psychosis/agitation Continue lithium 600 mg PO BID for mood instability Continue Coreg 12.5 mg PO BID for HTN Continue Haldol/Ativan IM PRN agitation Continue Restoril 30 mg PO QHS for insomnia  Patient will participate in the therapeutic group milieu.  Discharge disposition in progress.   Aldean Baker, NP 09/19/2019, 1:21 PM   Attest to NP Note

## 2019-09-19 NOTE — Progress Notes (Signed)
Adult Psychoeducational Group Note  Date:  09/19/2019 Time:  1:19 AM  Group Topic/Focus:  Wrap-Up Group:   The focus of this group is to help patients review their daily goal of treatment and discuss progress on daily workbooks.  Participation Level:  Active  Participation Quality:  Appropriate  Affect:  Appropriate  Cognitive:  Appropriate  Insight: Appropriate  Engagement in Group:  Developing/Improving  Modes of Intervention:  Discussion  Additional Comments: Pt stated her goal for today was to talk with her doctor about her discharge plan. Pt stated she was able to talk with her doctor today but they did not discuss her discharge plan. Pt stated her relationship with her family has improved since she was admitted here. Pt stated her father coming for visitation today help improve her day. Pt stated she felt about the same about herself today. Pt rated her overall day a 7 out of 10. Pt stated her appetite was improved today. Pt stated her goal for tonight is to get some rest. Pt did not complain of any pain tonight. Pt stated she was not hearing voices or seeing anything that was not there.  Pt stated she had no thoughts of harming herself or others. Pt stated she would alert staff if anything changes.    Candy Sledge 09/19/2019, 1:19 AM

## 2019-09-20 MED ORDER — PERPHENAZINE 8 MG PO TABS
8.0000 mg | ORAL_TABLET | Freq: Once | ORAL | Status: AC
  Administered 2019-09-20: 8 mg via ORAL
  Filled 2019-09-20: qty 1

## 2019-09-20 MED ORDER — PERPHENAZINE 8 MG PO TABS
8.0000 mg | ORAL_TABLET | Freq: Three times a day (TID) | ORAL | Status: DC
  Administered 2019-09-20 – 2019-09-21 (×4): 8 mg via ORAL
  Filled 2019-09-20 (×12): qty 1

## 2019-09-20 NOTE — Progress Notes (Addendum)
[facility]  09/20/2019 12:33 PM Lisa [name]  MRN:  [id number] Subjective:  "I'm not taking the lithium."  Lisa [name] seen at the nurses station. She refused her nighttime medication last night and had episodes of acute agitation, psychosis, and yelling overnight, requiring IM medication. She slept 4.5 hours after IM medication. Today she is refusing lithium, which she had taken yesterday. She simply states "I know I don't need it" and refuses to discuss further. She is refusing perphenazine 12 mg as well and states she will only take 8 mg perphenazine. She refuses to discuss any further or answer further questions.   I have discussed this case with Dr. Jama Flavorsobos, as patient will likely need a forced medication consult. Forced medication consult will have to take place tomorrow due to need for two consulting MDs. I will change perphenazine to 8 mg to increase chances for compliance with an oral medication in the meantime. Orders for IM medications PRN agitation remain in place.  From admission H&P: This is the sixth psychiatric admission for Lisa [name],and she was recently here from 9/28 to 10/6-thus she is admitted exactly 1 week after her last discharge. Despite long-acting injectable aripiprazole administered on 10/2 at 400 mg IM, as well as lithium and Risperdal at 8 mg at bedtime, the patient presented on 10/12 with paranoia, some unusual ideas of reference, believing that her current psychiatrist had been "receiving messages from her,and sending them to her"  Principal Problem: <principal problem not specified> Diagnosis: Active Problems:   Schizoaffective disorder, bipolar type (HCC)  Total Time spent with patient: 15 minutes  Past Psychiatric History: See admission H&P  Past Medical History:  Past Medical History:  Diagnosis Date  . Anxiety     Past Surgical History:  Procedure Laterality Date  . extraction of wisdom teeth     Family History:  Family History  Problem  Relation Age of Onset  . Schizophrenia Mother   . Depression Father   . Seizures Sister    Family Psychiatric  History: See admission H&P Social History:  Social History   Substance and Sexual Activity  Alcohol Use Yes   Comment: casually     Social History   Substance and Sexual Activity  Drug Use No  . Types: Marijuana   Comment: THC last used 2 yrs ago, unsure of amount    Social History   Socioeconomic History  . Marital status: Single    Spouse name: Not on file  . Number of children: Not on file  . Years of education: Not on file  . Highest education level: Not on file  Occupational History  . Not on file  Social Needs  . Financial resource strain: Not on file  . Food insecurity    Worry: Not on file    Inability: Not on file  . Transportation needs    Medical: Not on file    Non-medical: Not on file  Tobacco Use  . Smoking status: Former Smoker    Packs/day: 0.25    Years: 0.50    Pack years: 0.12    Types: Cigarettes    Quit date: 09/03/2015    Years since quitting: 4.0  . Smokeless tobacco: Never Used  Substance and Sexual Activity  . Alcohol use: Yes    Comment: casually  . Drug use: No    Types: Marijuana    Comment: THC last used 2 yrs ago, unsure of amount  . Sexual activity: Not Currently  Comment: Plan B Pill  Lifestyle  . Physical activity    Days per week: Not on file    Minutes per session: Not on file  . Stress: Not on file  Relationships  . Social Musician on phone: Not on file    Gets together: Not on file    Attends religious service: Not on file    Active member of club or organization: Not on file    Attends meetings of clubs or organizations: Not on file    Relationship status: Not on file  Other Topics Concern  . Not on file  Social History Narrative  . Not on file   Additional Social History:    Pain Medications: see MAR Prescriptions: see MAR Over the Counter: see MAR History of alcohol / drug use?:  No history of alcohol / drug abuse                    Sleep: Poor  Appetite:  Good  Current Medications: Current Facility-Administered Medications  Medication Dose Route Frequency Provider Last Rate Last Dose  . carvedilol (COREG) tablet 12.5 mg  12.5 mg Oral BID WC Malvin Johns, MD   12.5 mg at 09/20/19 1214  . clonazePAM (KLONOPIN) tablet 2 mg  2 mg Oral Once Malvin Johns, MD      . haloperidol lactate (HALDOL) injection 10 mg  10 mg Intramuscular Q6H PRN Malvin Johns, MD   10 mg at 09/20/19 0243  . lithium carbonate (LITHOBID) CR tablet 600 mg  600 mg Oral Q12H Malvin Johns, MD   600 mg at 09/19/19 0734  . LORazepam (ATIVAN) tablet 2 mg  2 mg Oral Q4H PRN Malvin Johns, MD   2 mg at 09/20/19 0138   Or  . LORazepam (ATIVAN) injection 2 mg  2 mg Intramuscular Q4H PRN Malvin Johns, MD   2 mg at 09/17/19 1339  . neomycin-bacitracin-polymyxin (NEOSPORIN) ointment   Topical PRN Jearld Lesch, NP      . nicotine polacrilex (NICORETTE) gum 2 mg  2 mg Oral PRN Jearld Lesch, NP   2 mg at 09/19/19 0734  . perphenazine (TRILAFON) tablet 12 mg  12 mg Oral TID Aldean Baker, NP   8 mg at 09/19/19 1642  . temazepam (RESTORIL) capsule 30 mg  30 mg Oral QHS Malvin Johns, MD   30 mg at 09/19/19 2114    Lab Results: No results found for this or any previous visit (from the past 48 hour(s)).  Blood Alcohol level:  Lab Results  Component Value Date   ETH <10 09/15/2019   ETH <10 08/31/2019    Metabolic Disorder Labs: Lab Results  Component Value Date   HGBA1C 5.3 09/15/2019   MPG 105.41 09/15/2019   MPG 103 05/14/2015   Lab Results  Component Value Date   PROLACTIN 16.5 09/15/2019   PROLACTIN 18.6 06/10/2015   Lab Results  Component Value Date   CHOL 210 (H) 09/15/2019   TRIG 97 09/15/2019   HDL 60 09/15/2019   CHOLHDL 3.5 09/15/2019   VLDL 19 09/15/2019   LDLCALC 131 (H) 09/15/2019   LDLCALC 77 05/14/2015    Physical Findings: AIMS: Facial and Oral  Movements Muscles of Facial Expression: None, normal Lips and Perioral Area: None, normal Jaw: None, normal Tongue: None, normal,Extremity Movements Upper (arms, wrists, hands, fingers): None, normal Lower (legs, knees, ankles, toes): None, normal, Trunk Movements Neck, shoulders, hips: None, normal, Overall Severity Severity  of abnormal movements (highest score from questions above): None, normal Incapacitation due to abnormal movements: None, normal Patient's awareness of abnormal movements (rate only patient's report): No Awareness, Dental Status Current problems with teeth and/or dentures?: No Does patient usually wear dentures?: No  CIWA:    COWS:     Musculoskeletal: Strength & Muscle Tone: within normal limits Gait & Station: normal Patient leans: N/A  Psychiatric Specialty Exam: Physical Exam  Nursing note and vitals reviewed. Constitutional: She is oriented to person, place, and time. She appears well-developed and well-nourished.  Cardiovascular: Normal rate.  Respiratory: Effort normal.  Neurological: She is alert and oriented to person, place, and time.    Review of Systems  Constitutional: Negative.   Respiratory: Negative for cough and shortness of breath.   Cardiovascular: Negative for chest pain.  Psychiatric/Behavioral: Negative for depression, substance abuse and suicidal ideas. The patient is nervous/anxious and has insomnia.     Blood pressure 116/71, pulse 92, temperature 97.8 F (36.6 C), temperature source Oral, resp. rate 18, SpO2 98 %.There is no height or weight on file to calculate BMI.  General Appearance: Casual  Eye Contact:  Good  Speech:  Pressured  Volume:  Increased  Mood:  Irritable  Affect:  Congruent  Thought Process:  Coherent  Orientation:  Full (Time, Place, and Person)  Thought Content:  Delusions and Paranoid Ideation  Suicidal Thoughts:  No  Homicidal Thoughts:  No  Memory:  Immediate;   Fair Recent;   Fair  Judgement:  Poor   Insight:  Lacking  Psychomotor Activity:  Normal  Concentration:  Concentration: Poor and Attention Span: Poor  Recall:  [place] of Knowledge:  Good  Language:  Good  Akathisia:  No  Handed:  Right  AIMS (if indicated):     Assets:  Communication Skills Housing Physical Health Resilience  ADL's:  Intact  Cognition:  WNL  Sleep:  Number of Hours: 4.5     Treatment Plan Summary: Daily contact with patient to assess and evaluate symptoms and progress in treatment and Medication management   Continue inpatient hospitalization.  Decrease perphenazine to 8 mg PO TID for psychosis/agitation to increase chance of compliance before forced medication order can be obtained Continue lithium 600 mg PO BID for mood instability Continue Coreg 12.5 mg PO BID for HTN Continue Haldol/Ativan IM PRN agitation Continue Restoril 30 mg PO QHS for insomnia  Patient will participate in the therapeutic group milieu.  Discharge disposition in progress.   [name], NP 09/20/2019, 12:33 PM   Attest to NP Note

## 2019-09-20 NOTE — [facility] Group Notes (Signed)
[facility] LCSW Group Therapy Note  Date/Time:  09/20/2019  11:00AM-12:00PM  Type of Therapy and Topic:  Group Therapy:  Music and Mood  Participation Level:  Active   Description of Group: In this process group, members listened to a variety of genres of music and identified that different types of music evoke different responses.  Patients were encouraged to identify music that was soothing for them and music that was energizing for them.  Patients discussed how this knowledge can help with wellness and recovery in various ways including managing depression and anxiety as well as encouraging healthy sleep habits.    Therapeutic Goals: 1. Patients will explore the impact of different varieties of music on mood 2. Patients will verbalize the thoughts they have when listening to different types of music 3. Patients will identify music that is soothing to them as well as music that is energizing to them 4. Patients will discuss how to use this knowledge to assist in maintaining wellness and recovery 5. Patients will explore the use of music as a coping skill  Summary of Patient Progress:  At the beginning of group, patient expressed that she felt good.  At the end of group, patient expressed that she felt better.  She danced and smiled throughout group, but was also in and out of the room multiple times.    Therapeutic Modalities: Solution Focused Brief Therapy Activity   Selmer Dominion, LCSW

## 2019-09-20 NOTE — Progress Notes (Signed)
Patient has been observed up in the dayroom constantly getting up and down walking in front of the males and sitting close to them wanting to whisper to them. She has been walking out into the hallway with Vonna Kotyk and had to be reminded numerous times about safe distance and no touching or hugging. Writer spoke with her concerning her behavior and she reported that she suffers from PTSD. She refused her lithium and trilafon reporting the dosage was too much. She was encouraged to discuss this matter with the doctor but she reported that she spoke to no one about her medications. Safety maintained with 15 min check.

## 2019-09-20 NOTE — Progress Notes (Signed)
Patient has been up 2-3 times coming up to nursing station asking about [place] and wanting a flu shot. She received 2 mg ativan for anxiety/restlessness. Writer encouraged her to take her other medications she refused earlier and she declined. She was advised to rest in her room and not wake others when coming up the hallway talking loudly. Will monitor effectiveness of medication given.

## 2019-09-20 NOTE — Progress Notes (Signed)
Patient agreed to take all hs medication except her lithium. She has been a little calmer and had to be redirected a few times. Safety maintained with 15 min checks.

## 2019-09-20 NOTE — Progress Notes (Signed)
Pt mood is labile (changes from being overly pleasant to irritable), easily agitated (refusing to speak with the NP because she increased her medications) and intrusive (involving herself in other pts treatment). Pt continues to be flirtatious with the males on the unit. Pt redirected by staff as needed for inappropriate behaviors.

## 2019-09-20 NOTE — Progress Notes (Signed)
Patient received haldol 10 mg IM after coming up to the nursing station reporting that someone was in her room. Writer and MHT explained that no one has been in her room and she reported that there better not be. She later came back demanding that we get her package and we better not lose it. Patient continues to get herself worked up each time getting louder in the hallway while others are sleeping. Patient has not been asleep since dayroom closed and is busy in her room walking around pacing with arms up in the air. Will monitor effectiveness of medication.

## 2019-09-20 NOTE — Progress Notes (Signed)
   09/20/19 1000  Psych Admission Type (Psych Patients Only)  Admission Status Voluntary  Psychosocial Assessment  Patient Complaints None  Eye Contact Brief  Facial Expression Anxious  Affect Anxious;Preoccupied  Speech Logical/coherent  Interaction Assertive;Attention-seeking;Flirtatious  Motor Activity Fidgety;Pacing  Appearance/Hygiene Unremarkable  Behavior Characteristics Intrusive;Impulsive  Mood Anxious;Labile;Suspicious  Thought Process  Coherency WDL  Content Preoccupation;Paranoia  Delusions None reported or observed  Perception WDL  Hallucination None reported or observed  Judgment Impaired  Confusion None  Danger to Self  Current suicidal ideation? Denies  Danger to Others  Danger to Others None reported or observed

## 2019-09-20 NOTE — Progress Notes (Signed)
Adult Psychoeducational Group Note  Date:  09/20/2019 Time:  2:21 AM  Group Topic/Focus:  Wrap-Up Group:   The focus of this group is to help patients review their daily goal of treatment and discuss progress on daily workbooks.  Participation Level:  Active  Participation Quality:  Appropriate  Affect:  Appropriate  Cognitive:  Appropriate  Insight: Appropriate  Engagement in Group:  Developing/Improving  Modes of Intervention:  Discussion  Additional Comments:  Pt stated her goal for today was to talk with her doctor about her discharge plan. Pt stated she was able to talk with her doctor today but they did not discuss her discharge plan. Pt stated her relationship with her family has improved since she was admitted here. Pt stated she felt about the same about herself today. Pt rated her overall day a 9 out of 10. Pt stated her appetite was pretty good today. Pt stated her goal for tonight is to get some rest. Pt did not complain of any pain tonight. Pt stated she was not hearing voices or seeing anything that was not there.  Pt stated she had no thoughts of harming herself or others. Pt stated she would alert staff if anything changes  Candy Sledge 09/20/2019, 2:21 AM

## 2019-09-20 NOTE — Progress Notes (Signed)
Pt refused to take Trilafon 12 mg and Lithium. The pt agreed to taking Trilafon 8 mg. Per Trude Mcburney NP., writer may order a one time order of Trilafon 8 mg.

## 2019-09-21 DIAGNOSIS — F250 Schizoaffective disorder, bipolar type: Secondary | ICD-10-CM | POA: Diagnosis not present

## 2019-09-21 MED ORDER — IBUPROFEN 800 MG PO TABS
800.0000 mg | ORAL_TABLET | Freq: Once | ORAL | Status: AC
  Administered 2019-09-26: 800 mg via ORAL
  Filled 2019-09-21 (×2): qty 1

## 2019-09-21 NOTE — Progress Notes (Signed)
Adult Psychoeducational Group Note  Date:  09/21/2019 Time:  12:03 AM  Group Topic/Focus:  Wrap-Up Group:   The focus of this group is to help patients review their daily goal of treatment and discuss progress on daily workbooks.  Participation Level:  Active  Participation Quality:  Appropriate  Affect:  Appropriate  Cognitive:  Appropriate  Insight: Appropriate  Engagement in Group:  Developing/Improving  Modes of Intervention:  Discussion  Additional Comments:  Pt stated her goal for todaywas to talk with her doctor about her dischargeplan. Pt stated she was able to talk with her doctor today but they did not discuss her discharge plan.Pt stated her relationship with her family has improved since she was admitted here. Pt stated been able to contact her mother and family friend help improve her day. Pt stated she felt about the same about herself today. Pt rated her overall day a 9 out of 10. Pt stated her appetite was pretty good today. Pt stated her goal for tonight is to get some sleep. Pt did not complain of any pain tonight. Pt stated she was not hearing voices or seeing anything that was not there. Pt stated she had no thoughts of harming herself or others. Pt stated she would alert staff if anything changes  Candy Sledge 09/21/2019, 12:03 AM

## 2019-09-21 NOTE — Care Management (Signed)
CMA sent referral to [place] for PHP.   CMA will follow up.     Horst Ostermiller Care Management Assistant  Email:[email or web address] Office: [phone]

## 2019-09-21 NOTE — Tx Team (Signed)
Interdisciplinary Treatment and Diagnostic Plan Update  09/21/2019 Time of Session: 10:10am Lisa [name] MRN: [id number]  Principal Diagnosis: <principal problem not specified>  Secondary Diagnoses: Active Problems:   Schizoaffective disorder, bipolar type (HCC)   Current Medications:  Current Facility-Administered Medications  Medication Dose Route Frequency Provider Last Rate Last Dose  . carvedilol (COREG) tablet 12.5 mg  12.5 mg Oral BID WC [name], MD   12.5 mg at 09/21/19 0747  . clonazePAM (KLONOPIN) tablet 2 mg  2 mg Oral Once [name], MD      . haloperidol lactate (HALDOL) injection 10 mg  10 mg Intramuscular Q6H PRN [name], MD   10 mg at 09/20/19 0243  . lithium carbonate (LITHOBID) CR tablet 600 mg  600 mg Oral Q12H [name], MD   600 mg at 09/19/19 0734  . LORazepam (ATIVAN) tablet 2 mg  2 mg Oral Q4H PRN [name], MD   2 mg at 09/20/19 0138   Or  . LORazepam (ATIVAN) injection 2 mg  2 mg Intramuscular Q4H PRN [name], MD   2 mg at 09/17/19 1339  . neomycin-bacitracin-polymyxin (NEOSPORIN) ointment   Topical PRN [name], NP      . nicotine polacrilex (NICORETTE) gum 2 mg  2 mg Oral PRN [name], NP   2 mg at 09/20/19 2048  . perphenazine (TRILAFON) tablet 8 mg  8 mg Oral TID [name], NP   8 mg at 09/21/19 0747  . temazepam (RESTORIL) capsule 30 mg  30 mg Oral QHS [name], MD   30 mg at 09/20/19 2047   PTA Medications: Medications Prior to Admission  Medication Sig Dispense Refill Last Dose  . ARIPiprazole (ABILIFY) 10 MG tablet Take 10 mg by mouth daily.     Derrill Memo ON 10/05/2019] ARIPiprazole ER (ABILIFY MAINTENA) 400 MG SRER injection Inject 2 mLs (400 mg total) into the muscle every 28 (twenty-eight) days. DUE 11/2 1 each 11 09/04/2019  . benztropine (COGENTIN) 1 MG tablet Take 1 tablet (1 mg total) by mouth 2 (two) times daily. 60 tablet 2   . carvedilol (COREG) 12.5 MG tablet Take 1 tablet (12.5 mg total) by  mouth 2 (two) times daily with a meal. 60 tablet 2   . levonorgestrel-ethinyl estradiol (SEASONALE) 0.15-0.03 MG tablet Take 1 tablet by mouth daily.     [name] lithium carbonate (ESKALITH) 450 MG CR tablet Take 1 tablet (450 mg total) by mouth every 12 (twelve) hours. 60 tablet 2   . LORazepam (ATIVAN) 2 MG tablet Take 2 mg by mouth daily.     [name] omega-3 acid ethyl esters (LOVAZA) 1 g capsule Take 1 capsule (1 g total) by mouth 2 (two) times daily. 60 capsule 11   . temazepam (RESTORIL) 30 MG capsule Take 1 capsule (30 mg total) by mouth at bedtime. 30 capsule 0   . risperiDONE (RISPERDAL) 4 MG tablet Take 2 tablets (8 mg total) by mouth at bedtime. (Patient not taking: Reported on 09/15/2019) 60 tablet 2 Not Taking at Unknown time    Patient Stressors:    Patient Strengths:    Treatment Modalities: Medication Management, Group therapy, Case management,  1 to 1 session with clinician, Psychoeducation, Recreational therapy.   Physician Treatment Plan for Primary Diagnosis: <principal problem not specified> Long Term Goal(s): Improvement in symptoms so as ready for discharge Improvement in symptoms so as ready for discharge   Short Term Goals: Ability to verbalize feelings will  improve Ability to disclose and discuss suicidal ideas Ability to demonstrate self-control will improve Ability to identify and develop effective coping behaviors will improve Ability to disclose and discuss suicidal ideas Ability to demonstrate self-control will improve Ability to identify and develop effective coping behaviors will improve Ability to maintain clinical measurements within normal limits will improve  Medication Management: Evaluate patient's response, side effects, and tolerance of medication regimen.  Therapeutic Interventions: 1 to 1 sessions, Unit Group sessions and Medication administration.  Evaluation of Outcomes: Progressing  Physician Treatment Plan for Secondary Diagnosis: Active  Problems:   Schizoaffective disorder, bipolar type (HCC)  Long Term Goal(s): Improvement in symptoms so as ready for discharge Improvement in symptoms so as ready for discharge   Short Term Goals: Ability to verbalize feelings will improve Ability to disclose and discuss suicidal ideas Ability to demonstrate self-control will improve Ability to identify and develop effective coping behaviors will improve Ability to disclose and discuss suicidal ideas Ability to demonstrate self-control will improve Ability to identify and develop effective coping behaviors will improve Ability to maintain clinical measurements within normal limits will improve     Medication Management: Evaluate patient's response, side effects, and tolerance of medication regimen.  Therapeutic Interventions: 1 to 1 sessions, Unit Group sessions and Medication administration.  Evaluation of Outcomes: Progressing   RN Treatment Plan for Primary Diagnosis: <principal problem not specified> Long Term Goal(s): Knowledge of disease and therapeutic regimen to maintain health will improve  Short Term Goals: Ability to participate in decision making will improve, Ability to verbalize feelings will improve, Ability to disclose and discuss suicidal ideas, Ability to identify and develop effective coping behaviors will improve and Compliance with prescribed medications will improve  Medication Management: RN will administer medications as ordered by provider, will assess and evaluate patient's response and provide education to patient for prescribed medication. RN will report any adverse and/or side effects to prescribing provider.  Therapeutic Interventions: 1 on 1 counseling sessions, Psychoeducation, Medication administration, Evaluate responses to treatment, Monitor vital signs and CBGs as ordered, Perform/monitor CIWA, COWS, AIMS and Fall Risk screenings as ordered, Perform wound care treatments as ordered.  Evaluation of  Outcomes: Progressing   LCSW Treatment Plan for Primary Diagnosis: <principal problem not specified> Long Term Goal(s): Safe transition to appropriate next level of care at discharge, Engage patient in therapeutic group addressing interpersonal concerns.  Short Term Goals: Engage patient in aftercare planning with referrals and resources and Increase skills for wellness and recovery  Therapeutic Interventions: Assess for all discharge needs, 1 to 1 time with Social worker, Explore available resources and support systems, Assess for adequacy in community support network, Educate family and significant other(s) on suicide prevention, Complete Psychosocial Assessment, Interpersonal group therapy.  Evaluation of Outcomes: Progressing   Progress in Treatment: Attending groups: Yes. Participating in groups: Yes. Taking medication as prescribed: Yes. Toleration medication: Yes. Family/Significant other contact made: No, will contact:  pt's friend Patient understands diagnosis: No. Discussing patient identified problems/goals with staff: Yes. Medical problems stabilized or resolved: Yes. Denies suicidal/homicidal ideation: Yes. Issues/concerns per patient self-inventory: No. Other:   New problem(s) identified: No, Describe:  None  New Short Term/Long Term Goal(s): Medication stabilization, elimination of SI thoughts, and development of a comprehensive mental wellness plan.   Patient Goals:  "to go home"  Discharge Plan or Barriers: Patient's psychiatrist, Dr. Rene KocherEksir, stated that he will not take her back and that she needs additional support that is beyond regular psychiatry. Pt is in need  of an IOP or PHP program. Patient is not appropriate for the PHP program through [facility]. CSW will continue to look for other options for the patient.   Reason for Continuation of Hospitalization: Aggression Medication stabilization  Estimated Length of Stay: 2-3 days    Attendees: Patient:  09/21/2019  Physician: Dr. Malvin Johns, MD 09/21/2019   Nursing: Lanora Manis, RN 09/21/2019   RN Care Manager: 09/21/2019   Social Worker: Stephannie Peters, LCSW  09/21/2019   Recreational Therapist:  09/21/2019   Other:  09/21/2019   Other:  09/21/2019   Other: 09/21/2019      Scribe for Treatment Team: Delphia Grates, LCSW 09/21/2019 10:32 AM

## 2019-09-21 NOTE — Progress Notes (Signed)
DAR NOTE: Patient presents with anxious affect and mood.  Denies pain, auditory and visual hallucinations.  Rates depression at 0, hopelessness at 0, and anxiety at 0.  Maintained on routine safety checks.  Medications given as prescribed but refused Lithium after several encouragement.  Support and encouragement offered as needed.  Attended group and participated.  States goal for today is "discharge."  Patient observed socializing with peers in the dayroom.  Patient is safe on and off the unit.  Offered no complaint.

## 2019-09-21 NOTE — Progress Notes (Signed)
[facility] MD Progress Note  09/21/2019 2:27 PM Lisa [name]  MRN:  [id number] Subjective:   Patient continues to refuse lithium however she was improving with it she is currently alert oriented and intrusive labile in mood and focused on discharge and insisting she will not take certain medications picking and choosing medications did not appreciate or would not take the escalated dose of perphenazine.  She also does not qualify for act team services given her insurance.  Further she continues to insert herself into the patient care of others, advising other patients so forth. Principal Problem: Exacerbation of bipolar symptomatology Diagnosis: Active Problems:   Schizoaffective disorder, bipolar type ([place])  Total Time spent with patient: 20 minutes  Past Psychiatric History: no new data  Past Medical History:  Past Medical History:  Diagnosis Date  . Anxiety     Past Surgical History:  Procedure Laterality Date  . extraction of wisdom teeth     Family History:  Family History  Problem Relation Age of Onset  . Schizophrenia Mother   . Depression Father   . Seizures Sister    Family Psychiatric  History: no new data Social History:  Social History   Substance and Sexual Activity  Alcohol Use Yes   Comment: casually     Social History   Substance and Sexual Activity  Drug Use No  . Types: Marijuana   Comment: THC last used 2 yrs ago, unsure of amount    Social History   Socioeconomic History  . Marital status: Single    Spouse name: Not on file  . Number of children: Not on file  . Years of education: Not on file  . Highest education level: Not on file  Occupational History  . Not on file  Social Needs  . Financial resource strain: Not on file  . Food insecurity    Worry: Not on file    Inability: Not on file  . Transportation needs    Medical: Not on file    Non-medical: Not on file  Tobacco Use  . Smoking status: Former Smoker    Packs/day: 0.25   Years: 0.50    Pack years: 0.12    Types: Cigarettes    Quit date: 09/03/2015    Years since quitting: 4.0  . Smokeless tobacco: Never Used  Substance and Sexual Activity  . Alcohol use: Yes    Comment: casually  . Drug use: No    Types: Marijuana    Comment: THC last used 2 yrs ago, unsure of amount  . Sexual activity: Not Currently    Comment: Plan B Pill  Lifestyle  . Physical activity    Days per week: Not on file    Minutes per session: Not on file  . Stress: Not on file  Relationships  . Social Herbalist on phone: Not on file    Gets together: Not on file    Attends religious service: Not on file    Active member of club or organization: Not on file    Attends meetings of clubs or organizations: Not on file    Relationship status: Not on file  Other Topics Concern  . Not on file  Social History Narrative  . Not on file   Additional Social History:    Pain Medications: see MAR Prescriptions: see MAR Over the Counter: see MAR History of alcohol / drug use?: No history of alcohol / drug abuse  Sleep: Fair  Appetite:  Fair  Current Medications: Current Facility-Administered Medications  Medication Dose Route Frequency Provider Last Rate Last Dose  . carvedilol (COREG) tablet 12.5 mg  12.5 mg Oral BID WC Malvin JohnsFarah, Nika Yazzie, MD   12.5 mg at 09/21/19 0747  . clonazePAM (KLONOPIN) tablet 2 mg  2 mg Oral Once Malvin JohnsFarah, Charlaine Utsey, MD      . haloperidol lactate (HALDOL) injection 10 mg  10 mg Intramuscular Q6H PRN Malvin JohnsFarah, Ezzard Ditmer, MD   10 mg at 09/20/19 0243  . lithium carbonate (LITHOBID) CR tablet 600 mg  600 mg Oral Q12H Malvin JohnsFarah, Hilmar Moldovan, MD   600 mg at 09/19/19 0734  . LORazepam (ATIVAN) tablet 2 mg  2 mg Oral Q4H PRN Malvin JohnsFarah, Wendal Wilkie, MD   2 mg at 09/20/19 0138   Or  . LORazepam (ATIVAN) injection 2 mg  2 mg Intramuscular Q4H PRN Malvin JohnsFarah, Brytney Somes, MD   2 mg at 09/17/19 1339  . neomycin-bacitracin-polymyxin (NEOSPORIN) ointment   Topical PRN Jearld Leschixon, Rashaun  M, NP      . nicotine polacrilex (NICORETTE) gum 2 mg  2 mg Oral PRN Jearld Leschixon, Rashaun M, NP   2 mg at 09/20/19 2048  . perphenazine (TRILAFON) tablet 8 mg  8 mg Oral TID Aldean BakerSykes, Janet E, NP   8 mg at 09/21/19 0747  . temazepam (RESTORIL) capsule 30 mg  30 mg Oral QHS Malvin JohnsFarah, Latrisha Coiro, MD   30 mg at 09/20/19 2047    Lab Results: No results found for this or any previous visit (from the past 48 hour(s)).  Blood Alcohol level:  Lab Results  Component Value Date   ETH <10 09/15/2019   ETH <10 08/31/2019    Metabolic Disorder Labs: Lab Results  Component Value Date   HGBA1C 5.3 09/15/2019   MPG 105.41 09/15/2019   MPG 103 05/14/2015   Lab Results  Component Value Date   PROLACTIN 16.5 09/15/2019   PROLACTIN 18.6 06/10/2015   Lab Results  Component Value Date   CHOL 210 (H) 09/15/2019   TRIG 97 09/15/2019   HDL 60 09/15/2019   CHOLHDL 3.5 09/15/2019   VLDL 19 09/15/2019   LDLCALC 131 (H) 09/15/2019   LDLCALC 77 05/14/2015    Physical Findings: AIMS: Facial and Oral Movements Muscles of Facial Expression: None, normal Lips and Perioral Area: None, normal Jaw: None, normal Tongue: None, normal,Extremity Movements Upper (arms, wrists, hands, fingers): None, normal Lower (legs, knees, ankles, toes): None, normal, Trunk Movements Neck, shoulders, hips: None, normal, Overall Severity Severity of abnormal movements (highest score from questions above): None, normal Incapacitation due to abnormal movements: None, normal Patient's awareness of abnormal movements (rate only patient's report): No Awareness, Dental Status Current problems with teeth and/or dentures?: No Does patient usually wear dentures?: No  CIWA:    COWS:     Musculoskeletal: Strength & Muscle Tone: within normal limits Gait & Station: normal Patient leans: N/A  Psychiatric Specialty Exam: Physical Exam  ROS  Blood pressure 119/67, pulse (!) 103, temperature 97.7 F (36.5 C), temperature source Oral, resp.  rate 18, SpO2 98 %.There is no height or weight on file to calculate BMI.  General Appearance: casual  Eye Contact:  Fair  Speech:  Clear and Coherent  Volume:  Increased  Mood: Hypomanic and becomes irritable when discussing medications she will and will not take  Affect:  Congruent and Full Range  Thought Process:  Linear and Descriptions of Associations: Circumstantial  Orientation:  Full (Time, Place, and Person)  Thought  Content:  Illogical and Tangential  Suicidal Thoughts:  No  Homicidal Thoughts:  No  Memory:  Immediate;   Fair Recent;   Fair Remote;   Good  Judgement:  Impaired  Insight:  Lacking  Psychomotor Activity:  Normal  Concentration:  Concentration: Fair and Attention Span: Fair  Recall:  [employer] of Knowledge:  Fair  Language:  Fair  Akathisia:  Negative  Handed:  Right  AIMS (if indicated):     Assets:  Communication Skills Desire for Improvement  ADL's:  Intact  Cognition:  WNL  Sleep:  Number of Hours: 6.25     Treatment Plan Summary: Daily contact with patient to assess and evaluate symptoms and progress in treatment and Medication management attempted to make contact with her outpatient clinician who usually calls back we will chat about her case again she is refusing multiple medications/doses of lithium and this is problematic as she was improving with it she will also require long-acting injectable which she is refusing for now.  We will discuss forced medication with team  Malvin Johns, MD 09/21/2019, 2:27 PM

## 2019-09-21 NOTE — Progress Notes (Signed)
D: Pt controversial some this evening requesting to be let go. Pt was redirectable this evening.  A: Pt was offered support and encouragement. Pt was encourage to attend groups. Q 15 minute checks were done for safety.  R: safety maintained on unit.
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
## 2019-09-21 NOTE — Progress Notes (Signed)
Patient rated her day as a 10 out of a possible 10 because she felt "love" from her peers. She would not explain her thought nor would she discuss her day. Her goal for tomorrow is to "go home".

## 2019-09-21 NOTE — Progress Notes (Signed)
Recreation Therapy Notes  Date: 10.19.20 Time: 1000 Location: 500 Hall Dayroom  Group Topic: Coping Skills  Goal Area(s) Addresses:  Patient will identify positive coping skills. Patient will identify benefit of using coping skills post d/c.  Behavioral Response: Engaged  Intervention: [employer], dry erase marker, worksheet, pencils  Activity: Mind Map.  Patients were given a blank mind map.  LRT and patients filled in the first 8 boxes together (arguments, road rage, solicitors, family, work, finances, weather and relationships). Patients were given time to come up with at least 3 coping skills for the areas identified.  LRT would then write the coping skills on the board so patients could fill in any blank spaces they may have had on their mind map.    Education: Radiographer, therapeutic, Dentist.   Education Outcome: Acknowledges understanding/In group clarification offered/Needs additional education.   Clinical Observations/Feedback: Pt was appropriate and able to focus on activity.  Pt identified coping skills as talking, be loving; breath, talk it through; love, loyalty; talk to therapist/neutral party; consolidate; rain boots, proper attire; and take responsibility/ownership and trust.    Victorino Sparrow, LRT/CTRS    Victorino Sparrow A 09/21/2019 11:32 AM

## 2019-09-21 NOTE — Progress Notes (Signed)
Pt defiant on the unit refusing medication. Pt stated " I want 2 Ativan". Pt informed she had Restoril and Trilafon. Pt took the Restoril per [facility]

## 2019-09-22 DIAGNOSIS — F250 Schizoaffective disorder, bipolar type: Secondary | ICD-10-CM | POA: Diagnosis not present

## 2019-09-22 MED ORDER — OXCARBAZEPINE 150 MG PO TABS
150.0000 mg | ORAL_TABLET | Freq: Two times a day (BID) | ORAL | Status: DC
  Administered 2019-09-22 – 2019-09-27 (×10): 150 mg via ORAL
  Filled 2019-09-22 (×13): qty 1

## 2019-09-22 NOTE — Progress Notes (Signed)
Pt claimed she had a HA and 1x order for Ibuprofen was obtained, but pt fell asleep, then woke up and stated she did not need it anymore.

## 2019-09-22 NOTE — Progress Notes (Signed)
Recreation Therapy Notes  Date: 10.20.20 Time: 0955 Location: 500 Hall Dayroom  Group Topic: Triggers  Goal Area(s) Addresses:  Patient will identify triggers. Patient will identify how to avoid triggers. Patient will identify how to deal with triggers head on.  Behavioral Response: Engaged  Intervention: Worksheet, music  Activity: Triggers.  Patients were to identify their biggest triggers.  Patients would then identify ways to avoid/reduce exposure to triggers and how to face triggers head on.  Education: Triggers, Discharge Planning  Education Outcome: Acknowledges understanding/In group clarification offered/Needs additional education.   Clinical Observations/Feedback: Pt identified her triggers as guilt, meanness and anger.  Pt expressed avoiding triggers by staying balanced and head butt them gently.  Pt expressed dealing with triggers head on staying away and by becoming a warrior.    Victorino Sparrow, LRT/CTRS    Ria Comment, Roddie Riegler A 09/22/2019 11:30 AM

## 2019-09-22 NOTE — Progress Notes (Signed)
The patient's coping skills include reading and meditation and "grounding". Her goal for tomorrow is to get discharged.

## 2019-09-22 NOTE — Progress Notes (Signed)
Pt wanted to have her medications changed to be taken all at once, pt was informed to talk to the doctor. Pt continues to be focused on her medications and does not understand medications can be used for more than what the original use states .

## 2019-09-22 NOTE — Progress Notes (Signed)
D: Pt controversial and has no insight into her medications .  A: Pt was offered support and encouragement. Pt was given scheduled medications. Pt was encourage to attend groups. Q 15 minute checks were done for safety.  R:Pt attends groups and interacts well with peers and staff. Pt is taking medication. Pt has no complaints.Pt receptive to treatment and safety maintained on unit.

## 2019-09-22 NOTE — Progress Notes (Signed)
[facility] MD Progress Note  09/22/2019 7:40 AM Lisa [name]  MRN:  [id number] Subjective:    Behaviorally contained for the entire interview today not argumentative generally cooperative however still hypomanic at intervals and singing on the ward. She still wants to dictate care, she does finally agree to long-acting injectable paliperidone however she will not take Tegretol lithium and Depakote so forth.  Further, she states that she will take Trileptal even though it is not indicated of course and does have a risk of hyponatremia.  The dilemma is she does need a different long-acting injectable but the Abilify is in her system until 11/2. No EPS or TD Principal Problem: Bipolar mania Diagnosis: Active Problems:   Schizoaffective disorder, bipolar type (HCC)  Total Time spent with patient: 20 minutes  Past Psychiatric History: Recently discharged from here  Past Medical History:  Past Medical History:  Diagnosis Date  . Anxiety     Past Surgical History:  Procedure Laterality Date  . extraction of wisdom teeth     Family History:  Family History  Problem Relation Age of Onset  . Schizophrenia Mother   . Depression Father   . Seizures Sister    Family Psychiatric  History: no new data Social History:  Social History   Substance and Sexual Activity  Alcohol Use Yes   Comment: casually     Social History   Substance and Sexual Activity  Drug Use No  . Types: Marijuana   Comment: THC last used 2 yrs ago, unsure of amount    Social History   Socioeconomic History  . Marital status: Single    Spouse name: Not on file  . Number of children: Not on file  . Years of education: Not on file  . Highest education level: Not on file  Occupational History  . Not on file  Social Needs  . Financial resource strain: Not on file  . Food insecurity    Worry: Not on file    Inability: Not on file  . Transportation needs    Medical: Not on file    Non-medical: Not on file   Tobacco Use  . Smoking status: Former Smoker    Packs/day: 0.25    Years: 0.50    Pack years: 0.12    Types: Cigarettes    Quit date: 09/03/2015    Years since quitting: 4.0  . Smokeless tobacco: Never Used  Substance and Sexual Activity  . Alcohol use: Yes    Comment: casually  . Drug use: No    Types: Marijuana    Comment: THC last used 2 yrs ago, unsure of amount  . Sexual activity: Not Currently    Comment: Plan B Pill  Lifestyle  . Physical activity    Days per week: Not on file    Minutes per session: Not on file  . Stress: Not on file  Relationships  . Social Musician on phone: Not on file    Gets together: Not on file    Attends religious service: Not on file    Active member of club or organization: Not on file    Attends meetings of clubs or organizations: Not on file    Relationship status: Not on file  Other Topics Concern  . Not on file  Social History Narrative  . Not on file   Additional Social History:    Pain Medications: see MAR Prescriptions: see MAR Over the Counter: see MAR History of  alcohol / drug use?: No history of alcohol / drug abuse                    Sleep: Good  Appetite:  Good  Current Medications: Current Facility-Administered Medications  Medication Dose Route Frequency Provider Last Rate Last Dose  . carvedilol (COREG) tablet 12.5 mg  12.5 mg Oral BID WC [name], MD   12.5 mg at 09/21/19 1619  . clonazePAM (KLONOPIN) tablet 2 mg  2 mg Oral Once [name], MD      . haloperidol lactate (HALDOL) injection 10 mg  10 mg Intramuscular Q6H PRN [name], MD   10 mg at 09/20/19 0243  . ibuprofen (ADVIL) tablet 800 mg  800 mg Oral Once Lindon Romp A, NP      . lithium carbonate (LITHOBID) CR tablet 600 mg  600 mg Oral Q12H [name], MD   600 mg at 09/19/19 0734  . LORazepam (ATIVAN) tablet 2 mg  2 mg Oral Q4H PRN [name], MD   2 mg at 09/20/19 0138   Or  . LORazepam (ATIVAN) injection 2 mg  2  mg Intramuscular Q4H PRN [name], MD   2 mg at 09/17/19 1339  . neomycin-bacitracin-polymyxin (NEOSPORIN) ointment   Topical PRN [name], NP      . nicotine polacrilex (NICORETTE) gum 2 mg  2 mg Oral PRN [name], NP   2 mg at 09/21/19 1953  . perphenazine (TRILAFON) tablet 8 mg  8 mg Oral TID [name], NP   8 mg at 09/21/19 1619  . temazepam (RESTORIL) capsule 30 mg  30 mg Oral QHS [name], MD   30 mg at 09/21/19 2036    Lab Results: No results found for this or any previous visit (from the past 28 hour(s)).  Blood Alcohol level:  Lab Results  Component Value Date   ETH <10 09/15/2019   ETH <10 18/29/9371    Metabolic Disorder Labs: Lab Results  Component Value Date   HGBA1C 5.3 09/15/2019   MPG 105.41 09/15/2019   MPG 103 05/14/2015   Lab Results  Component Value Date   PROLACTIN 16.5 09/15/2019   PROLACTIN 18.6 06/10/2015   Lab Results  Component Value Date   CHOL 210 (H) 09/15/2019   TRIG 97 09/15/2019   HDL 60 09/15/2019   CHOLHDL 3.5 09/15/2019   VLDL 19 09/15/2019   LDLCALC 131 (H) 09/15/2019   LDLCALC 77 05/14/2015    Physical Findings: AIMS: Facial and Oral Movements Muscles of Facial Expression: None, normal Lips and Perioral Area: None, normal Jaw: None, normal Tongue: None, normal,Extremity Movements Upper (arms, wrists, hands, fingers): None, normal Lower (legs, knees, ankles, toes): None, normal, Trunk Movements Neck, shoulders, hips: None, normal, Overall Severity Severity of abnormal movements (highest score from questions above): None, normal Incapacitation due to abnormal movements: None, normal Patient's awareness of abnormal movements (rate only patient's report): No Awareness, Dental Status Current problems with teeth and/or dentures?: No Does patient usually wear dentures?: No  CIWA:    COWS:     Musculoskeletal: Strength & Muscle Tone: within normal limits Gait & Station: normal Patient leans:  N/A  Psychiatric Specialty Exam: Physical Exam  ROS  Blood pressure 120/87, pulse 89, temperature 98.5 F (36.9 C), temperature source Oral, resp. rate 18, SpO2 98 %.There is no height or weight on file to calculate BMI.  General Appearance: Casual  Eye Contact:  Good  Speech:  Clear and Coherent  Volume:  Normal  Mood:  Contained to hypomanic  Affect:  Congruent  Thought Process:  Linear and Descriptions of Associations: Circumstantial  Orientation:  Full (Time, Place, and Person)  Thought Content:  Illogical  Suicidal Thoughts:  No  Homicidal Thoughts:  No  Memory:  Immediate;   Fair Recent;   Fair Remote;   Fair  Judgement:  Fair  Insight:  Shallow  Psychomotor Activity:  EPS-negative  Concentration:  Concentration: Fair and Attention Span: Fair  Recall:  [employer]  Fund of Knowledge:  Fair  Language:  Fair  Akathisia:  Negative  Handed:  Right  AIMS (if indicated):     Assets:  Physical Health Resilience Social Support  ADL's:  Intact  Cognition:  WNL  Sleep:  Number of Hours: 5     Treatment Plan Summary: Daily contact with patient to assess and evaluate symptoms and progress in treatment and Medication management  As discussed at oxcarbazepine continue perphenazine and administer paliperidone but we may not have the opportunity to administer it prior to discharge based on rate of improvement and duration of the previously given long-acting injectable continue 15-minute precautions discussed with family with her permission  Lisa [name],Lisa Thackston, MD 09/22/2019, 7:40 AM

## 2019-09-23 DIAGNOSIS — F250 Schizoaffective disorder, bipolar type: Secondary | ICD-10-CM | POA: Diagnosis not present

## 2019-09-23 MED ORDER — PERPHENAZINE 4 MG PO TABS
12.0000 mg | ORAL_TABLET | Freq: Two times a day (BID) | ORAL | Status: DC
  Administered 2019-09-24: 12 mg via ORAL
  Filled 2019-09-23 (×4): qty 3

## 2019-09-23 MED ORDER — LORAZEPAM 2 MG/ML IJ SOLN: 4.0000 mg | Freq: Once | INTRAMUSCULAR | Status: DC

## 2019-09-23 MED ORDER — HALOPERIDOL LACTATE 5 MG/ML IJ SOLN: 10.0000 mg | Freq: Two times a day (BID) | INTRAMUSCULAR | Status: DC | PRN

## 2019-09-23 MED ORDER — HALOPERIDOL LACTATE 5 MG/ML IJ SOLN
10.0000 mg | Freq: Once | INTRAMUSCULAR | Status: AC
  Administered 2019-09-27: 10 mg via INTRAMUSCULAR
  Filled 2019-09-23 (×2): qty 2

## 2019-09-23 NOTE — Progress Notes (Signed)
[facility] Second Physician Opinion Progress Note for Medication Administration to Non-consenting Patients (For Involuntarily Committed Patients)  Patient: Lisa [name] Date of Birth: [id number] MRN: [id number]  Reason for the Medication: The patient, without the benefit of the specific treatment measure, is incapable of participating in any available treatment plan that will give the patient a realistic opportunity of improving the patient's condition. There is, without the benefit of the specific treatment measure, a significant possibility that the patient will harm self or others before improvement of the patient's condition is realized.  Consideration of Side Effects: Consideration of the side effects related to the medication plan has been given.  Rationale for Medication Administration: Patient is seen and examined.  Patient is a 28 year old female with a past psychiatric history significant for schizoaffective disorder; bipolar type who was admitted on 09/15/2019 secondary to bizarre delusions, disorganization, agitation and intrusive behaviors.  The patient had been admitted earlier in September.  Unfortunately she returned approximately 1 week after her discharge.  She had received long-acting injectable Abilify on 10/2.  She was also receiving lithium and Risperdal.  It was noted on 10/12 that she presented back with paranoid thoughts and had been noncompliant with her oral medications.  Unfortunately the long-acting injection was not sufficient enough to maintain her psychotic symptoms under control.  Early on in the hospitalization on 10/13 she was yelling, disruptive, agitated.  She did receive intramuscular medications on 10/13, but this was not enough to maintain her.  Review of the chart reveals noncompliance with her oral medications, continue bizarre and disruptive behavior.  On 2 visits that I had to the [place] unit she was intrusive, came up to me, wanted to know my name, and wanted a second  opinion.  On 09/23/2019 Dr. Jake Samples requested that I examined the patient to determine if forced medication protocol was appropriate.  Prior to my examination she would have episodes of singing, then agitation.  She agreed only to take carvedilol and Trileptal.  She declined a long-acting injectable paliperidone.  She remains manic, delusional and psychotic.  I agree with Dr. Jake Samples that forced medications are in the best interest of this patient to improve and for her safety as well as those of staff and other patients.    Sharma Covert, MD 09/23/19  1:31 PM   This documentation is good for (7) seven days from the date of the MD signature. New documentation must be completed every seven (7) days with detailed justification in the medical record if the patient requires continued non-emergent administration of psychotropic medications.

## 2019-09-23 NOTE — Progress Notes (Signed)
The patient began by sharing in group that she has bruises on her shins which were not present yesterday. She admits to kicking her bed earlier in the day. Overall, the patient verbalized that she had a great day since all of her peers were cooperative. Her goal for tomorrow is to go home.

## 2019-09-23 NOTE — Progress Notes (Signed)
Pt up to the nursing station asking about her Trileptal dose. Pt asked what is the range. Pt encouraged to talk to the doctor about that information .

## 2019-09-23 NOTE — Progress Notes (Signed)
Patient denies SI, HI and AVH.  Patient has been yelling and singing loudly on the unit.  Patient has picked and chose what medications she is going to take and not.  Assess patient for safety, offer medications as prescribed, engage patient in 1:1 staff talks.   Continue to monitor as planned.

## 2019-09-23 NOTE — Progress Notes (Signed)
Recreation Therapy Notes  Date: 10.21.20 Time: 1000 Location: 500 Hall Dayroom   Group Topic: Communication, Team Building, Problem Solving  Goal Area(s) Addresses:  Patient will effectively work with peer towards shared goal.  Patient will identify skills used to make activity successful.  Patient will identify how skills used during activity can be used to reach post d/c goals.   Behavioral Response: Engaged  Intervention: STEM Activity  Activity:  Cup [employer].  In groups of 4, patients were given 10 red cups and rubber band with four strings attached to it.  Patients were to use the rubber band contraption to sit the cups upright and then stack them like a pyramid.  Education: Education officer, community, Discharge Planning   Education Outcome: Acknowledges education/In group clarification offered/Needs additional education.   Clinical Observations/Feedback:  Pt was appropriate during group. Pt did bump heads with one of her peers on how they should maneuver as a team during the activity but they were able to get past it.  Pt left early but later returned.     Victorino Sparrow, LRT/CTRS    Victorino Sparrow A 09/23/2019 11:31 AM

## 2019-09-23 NOTE — [facility] Group Notes (Signed)
Occupational therapy Group Note  Date:  09/23/2019 Time:  3:05 PM  Group Topic/Focus:  Relaxation  Participation Level:  Active  Participation Quality:  Attentive  Affect:  Not Congruent  Cognitive:  Alert  Insight: Lacking  Engagement in Group:  Improving  Modes of Intervention:  Activity, Discussion, Education and Socialization  Additional Comments:    S:   O: Progressive muscle relaxation group completed to facilitate relaxation response and grow coping skills. Pt to follow PMR script and then share what songs make them feel relaxed for the group to listen to.  A: Pt presents eager to engage in group this date. She presents pleasant, and appropriately sharing with OT and group. She apologized to OT for previous group session behavior. During group she began hysterically crying and erasing the white board, and attempting to re engage in relaxation exercise. She then became pleasant, suggesting songs and sharing how she plays the piano. She then became irate and yelling at another group member. Still displaying labile affect and mood impacting insight and ability to engage in productive coping skill training.  P: OT group will be x1 per week while pt inpatient  Zenovia Jarred, MSOT, OTR/L [facility] Office: [phone] [facility] Office: [phone]  Zenovia Jarred 09/23/2019, 3:05 PM

## 2019-09-23 NOTE — Progress Notes (Signed)
Pt stated she wanted the doctor to consolidate all her medications and if he couldn't there would be a problem according to the patient

## 2019-09-23 NOTE — Progress Notes (Signed)
Patient ID: Lisa [name], female   DOB: March 02, 1991, 28 y.o.   MRN: [id number]   CSW left a voicemail with Dr. Daron Offer regarding the patient going to [facility] for [facility] once she does discharge. CSW left contact information.    Ardelle Anton, MSW, [facility] Phone: [phone] Fax: [phone]

## 2019-09-23 NOTE — Progress Notes (Addendum)
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 Patient continues to dictate care, only agreeable to taking coreg and trileptal which she states is a "perfect" medication regimen for her. Today she does not agree to long-acting injectable paliperidone secondary to prior side effect of nausea and continues to not take Tegretol & lithium. Patient is discharged focused today during interview, and continues to have little insight into current illness.  The dilemma is she does need a different long-acting injectable but the Abilify is in her system until 11/2. Treatment team will continue to emphasize the need for medication adherence today.  No EPS or TD Principal Problem: Bipolar mania Diagnosis: Active Problems:   Schizoaffective disorder, bipolar type ([place])  Total Time spent with patient: 20 minutes  Past Psychiatric History: Recently discharged from here  Past Medical History:  Past Medical History:  Diagnosis Date  . Anxiety     Past Surgical History:  Procedure Laterality Date  . extraction of wisdom teeth     Family History:  Family History  Problem Relation Age of Onset  . Schizophrenia Mother   . Depression Father   . Seizures Sister    Family Psychiatric  History: no new data Social History:  Social History   Substance and Sexual Activity  Alcohol Use Yes   Comment: casually     Social History   Substance and Sexual Activity  Drug Use No  . Types: Marijuana   Comment: THC last used 2 yrs ago, unsure of amount    Social History   Socioeconomic History  . Marital status: Single    Spouse name: Not on file  . Number of children: Not on file  . Years of  education: Not on file  . Highest education level: Not on file  Occupational History  . Not on file  Social Needs  . Financial resource strain: Not on file  . Food insecurity    Worry: Not on file    Inability: Not on file  . Transportation needs    Medical: Not on file    Non-medical: Not on file  Tobacco Use  . Smoking status: Former Smoker    Packs/day: 0.25    Years: 0.50    Pack years: 0.12    Types: Cigarettes    Quit date: 09/03/2015    Years since quitting: 4.0  . Smokeless tobacco: Never Used  Substance and Sexual Activity  . Alcohol use: Yes    Comment: casually  . Drug use: No    Types: Marijuana    Comment: THC last used 2 yrs ago, unsure of amount  . Sexual activity: Not Currently    Comment: Plan B Pill  Lifestyle  . Physical activity    Days per week: Not on file    Minutes per session: Not on file  . Stress: Not on file  Relationships  . Social Herbalist on phone: Not on file    Gets together: Not on file    Attends religious service: Not on file    Active member of club or organization: Not on file    Attends meetings of clubs or organizations: Not on file    Relationship status: Not on file  Other Topics Concern  . Not on file  Social History Narrative  . Not on file   Additional Social History:    Pain Medications: see MAR Prescriptions: see MAR Over the Counter: see MAR History of alcohol / drug use?: No history of alcohol / drug abuse                    Sleep: Good  Appetite:  Good  Current Medications: Current Facility-Administered Medications  Medication Dose Route Frequency Provider Last Rate Last Dose  . carvedilol (COREG) tablet 12.5 mg  12.5 mg Oral BID WC Malvin JohnsFarah, Shatara Stanek, MD   12.5 mg at 09/23/19 0805  . clonazePAM (KLONOPIN) tablet 2 mg  2 mg Oral Once Malvin JohnsFarah, Adryen Cookson, MD      . haloperidol lactate (HALDOL) injection 10 mg  10 mg Intramuscular Q6H PRN Malvin JohnsFarah, Yona Stansbury, MD   10 mg at 09/20/19 0243  . ibuprofen  (ADVIL) tablet 800 mg  800 mg Oral Once Nira ConnBerry, Jason A, NP      . lithium carbonate (LITHOBID) CR tablet 600 mg  600 mg Oral Q12H Malvin JohnsFarah, Avenir Lozinski, MD   600 mg at 09/19/19 0734  . LORazepam (ATIVAN) tablet 2 mg  2 mg Oral Q4H PRN Malvin JohnsFarah, Eusebio Blazejewski, MD   2 mg at 09/22/19 1218   Or  . LORazepam (ATIVAN) injection 2 mg  2 mg Intramuscular Q4H PRN Malvin JohnsFarah, Valdez Brannan, MD   2 mg at 09/17/19 1339  . neomycin-bacitracin-polymyxin (NEOSPORIN) ointment   Topical PRN Jearld Leschixon, Rashaun M, NP      . nicotine polacrilex (NICORETTE) gum 2 mg  2 mg Oral PRN Jearld Leschixon, Rashaun M, NP   2 mg at 09/22/19 1847  . OXcarbazepine (TRILEPTAL) tablet 150 mg  150 mg Oral BID Malvin JohnsFarah, Krithik Mapel, MD   150 mg at 09/23/19 0805  . perphenazine (TRILAFON) tablet 8 mg  8 mg Oral TID Aldean BakerSykes, Janet E, NP   8 mg at 09/21/19 1619  . temazepam (RESTORIL) capsule 30 mg  30 mg Oral QHS Malvin JohnsFarah, Pruitt Taboada, MD   30 mg at 09/22/19 2120    Lab Results: No results found for this or any previous visit (from the past 48 hour(s)).  Blood Alcohol level:  Lab Results  Component Value Date   ETH <10 09/15/2019   ETH <10 08/31/2019    Metabolic Disorder Labs: Lab Results  Component Value Date   HGBA1C 5.3 09/15/2019   MPG 105.41 09/15/2019   MPG 103 05/14/2015   Lab Results  Component Value Date   PROLACTIN 16.5 09/15/2019   PROLACTIN 18.6 06/10/2015   Lab Results  Component Value Date   CHOL 210 (H) 09/15/2019   TRIG 97 09/15/2019   HDL 60 09/15/2019   CHOLHDL 3.5 09/15/2019   VLDL 19 09/15/2019   LDLCALC 131 (H) 09/15/2019   LDLCALC 77 05/14/2015    Physical Findings: AIMS: Facial and Oral Movements Muscles of Facial Expression: None, normal Lips and Perioral Area: None, normal Jaw: None, normal Tongue: None, normal,Extremity Movements Upper (arms, wrists, hands, fingers): None, normal Lower (legs, knees, ankles, toes): None, normal, Trunk Movements Neck, shoulders, hips: None, normal, Overall Severity Severity of abnormal movements (highest score  from questions above): None, normal Incapacitation due to abnormal movements: None, normal Patient's awareness of abnormal movements (rate only patient's report): No Awareness, Dental Status Current problems with teeth and/or dentures?: No Does patient usually wear dentures?: No  CIWA:    COWS:     Musculoskeletal: Strength & Muscle  Tone: within normal limits Gait & Station: normal Patient leans: N/A  Psychiatric Specialty Exam: Physical Exam  ROS  Blood pressure 118/73, pulse 95, temperature 97.6 F (36.4 C), temperature source Oral, resp. rate 18, SpO2 98 %.There is no height or weight on file to calculate BMI.  General Appearance: Casual  Eye Contact:  Good  Speech:  Clear and Coherent  Volume:  Normal  Mood:  Contained to hypomanic  Affect:  Congruent  Thought Process:  Linear and Descriptions of Associations: Circumstantial  Orientation:  Full (Time, Place, and Person)  Thought Content:  Illogical  Suicidal Thoughts:  No  Homicidal Thoughts:  No  Memory:  Immediate;   Fair Recent;   Fair Remote;   Fair  Judgement:  Fair  Insight:  Shallow  Psychomotor Activity:  EPS-negative  Concentration:  Concentration: Fair and Attention Span: Fair  Recall:  [employer] of Knowledge:  Fair  Language:  Fair  Akathisia:  Negative  Handed:  Right  AIMS (if indicated):     Assets:  Physical Health Resilience Social Support  ADL's:  Intact  Cognition:  WNL  Sleep:  Number of Hours: 5.75     Treatment Plan Summary: Daily contact with patient to assess and evaluate symptoms and progress in treatment and Medication management   As discussed above, will continue patient on oxcarbazepine, perphenazine, and lithium, with forced meds possibly necessary today if patient continues to refuse meds after further discussion. Will continue to discuss paliperidone injection with the patient but we may not have the opportunity to administer it prior to discharge based on rate of improvement  and duration of the previously given long-acting injectable. Continue 15-minute precautions discussed with family with her permission Forced medication consult pending   1246pm: Patient approached me after rounds, asking when she could go home I explained to her that she need to be fully compliant with all of her medications she began screaming "F- YOU!"  Became hysterical slamming doors and using a lot of profanity/Jesse Hirst Reather Converse, Medical Student 09/23/2019, 9:02 AM

## 2019-09-23 NOTE — Progress Notes (Signed)
Pt stated she would like for the doctor to know" I have cyst in my thyroid , I would like an appointment with an endocrinologist"

## 2019-09-23 NOTE — Progress Notes (Signed)
Patient took off her shirt in the dayroom leaving herself uncovered. This Probation officer asked her to replace her shirt. She stated that she will not. This writer told her the day room will be closed if she does not replace her shirt. The patient started screaming and threatening. This Probation officer closed the day room. The patient went down the hallway and continue to scream calling another patient name as she scream telling him that " they are closing the dayroom early". Patient nurse went to speak with her. Patient continue screaming in her room.  Kimori Tartaglia Dub Mikes

## 2019-09-23 NOTE — Progress Notes (Signed)
The patient was redirected for putting her arms around her female peer and hugging him.

## 2019-09-23 NOTE — Progress Notes (Signed)
Pt was in the dayroom and took off her shirt and was informed that the shirt with spaghetti strings was in appropriate and pt became oppositional and started yelling and screaming. Staff closed the dayroom . Pt began to yell and scream down the hall , pt was informed that she needed to be respectful of other patients trying to sleep. " [place]"

## 2019-09-23 NOTE — Progress Notes (Signed)
Pt continues to be oppositional and continues to question any type of instructions.

## 2019-09-24 DIAGNOSIS — F250 Schizoaffective disorder, bipolar type: Secondary | ICD-10-CM | POA: Diagnosis not present

## 2019-09-24 MED ORDER — PERPHENAZINE 4 MG PO TABS
14.0000 mg | ORAL_TABLET | Freq: Two times a day (BID) | ORAL | Status: DC
  Filled 2019-09-24 (×2): qty 1

## 2019-09-24 MED ORDER — PERPHENAZINE 4 MG PO TABS
14.0000 mg | ORAL_TABLET | Freq: Two times a day (BID) | ORAL | Status: DC
  Administered 2019-09-24 – 2019-09-27 (×6): 14 mg via ORAL
  Filled 2019-09-24 (×8): qty 4

## 2019-09-24 NOTE — Progress Notes (Signed)
Pt has be calm on the unit this evening. Pt questioned writer on her medications as if she was challenging writer's knowledge of the medications.

## 2019-09-24 NOTE — Progress Notes (Signed)
Pt constantly coming to the nursing station asking usleess questions . " can I speak to an on call Pharmacist about my medications? " pt continues to not try to lay down and get some rest. Pt appears to be trying to find any distraction or excuse to have a conversation with staff. Pt has to be redirected numerous times

## 2019-09-24 NOTE — Progress Notes (Signed)
Patient rated her day as an 8 out of a possible 10 but would not explain any further ("I plead the 5th"). She tried to interfere with group by insisting that this Pryor Curia write down verbatim whatever she said. In addition, she wanted to know who did not attend group and their names and would not let the issue rest. Her goal for tomorrow is to go home and to return to her room.

## 2019-09-24 NOTE — Progress Notes (Addendum)
CSW spoke with patient at patient request. Patient had several questions and concerns.  Patient wanted to discuss her outpatient follow up. She states she wants to be seen outpatient by Dr.Cobos and Jefferson Fuel, NP. Patient then stated she wanted to see an female psychiatrist or NP, as she wants to have children one day and is concerned about her reproductive health in regard to psychiatric medications. CSW clarified that this patient would like to see a provider through [facility], patient stated she does and asked if Dr.Arfeen is accepting new patients.   CSW inquired about following up with PHP through [facility]. Patient states she is not agreeable to this. Patient also states she will no longer be working with Dr.Eksir. She wishes to continue seeing her therapist, Alyse Low, virtually through the [facility] app.  Patient is familiar with CSW from graduate school. Patient requested that this CSW advocate for this patient regarding her diagnosis. Patient explains that women who have been traumatized experience stigma and are inaccurately diagnosed. Patient requests CSW "advocate to Dr.Farah that this is just PTSD. I'm not schizophrenic or schizoaffective."  Of note patient also signed consents for CSWs to speak with her parents, including her father. Patient's father called the unit yesterday asking to speak with a CSW regarding guardianship of the patient; however, CSW did not have permission to speak with father at that time.  Per patient request, CSW called patient's employer, [employer] [phone]) to provide contact information for patient's assigned CSW.  Stephanie Acre, LCSW-A Clinical Social Worker

## 2019-09-24 NOTE — Progress Notes (Signed)
[facility] MD Progress Note  09/24/2019 10:19 AM Lisa [name]  MRN:  [id number] Subjective:   Patient's afternoon yesterday was quite problematic, she called to me in the hallway to ask a medication question and when I stressed compliance she began screaming "F-- You!!"  Slamming doors so forth but she did calm down without IM medications.  Forced medication consult was in agreement and the patient did receive IM Haldol when she refused doses of perphenazine. She remains manic she remains pressured in her speech she can hold together and be cordial for brief periods of time.  She engages every clinician even nursing students for prolonged conversations every time she sees someone and has their attention.  Again hyperverbal pressured rambling and some degree of paranoia but no form delusions just please were all working against her rather than for her best interests She also cites numerous pseudoallergies as a reason she cannot take numerous medications  Principal Problem: Treatment resistant manic symptoms/both treatment resistant in the terms of their pathology and at her psychological approach to treatment Diagnosis: Active Problems:   Schizoaffective disorder, bipolar type ([facility])  Total Time spent with patient: 20 minutes  Past Psychiatric History: no new  Past Medical History:  Past Medical History:  Diagnosis Date  . Anxiety     Past Surgical History:  Procedure Laterality Date  . extraction of wisdom teeth     Family History:  Family History  Problem Relation Age of Onset  . Schizophrenia Mother   . Depression Father   . Seizures Sister    Family Psychiatric  History: no new  Social History:  Social History   Substance and Sexual Activity  Alcohol Use Yes   Comment: casually     Social History   Substance and Sexual Activity  Drug Use No  . Types: Marijuana   Comment: THC last used 2 yrs ago, unsure of amount    Social History   Socioeconomic History  . Marital  status: Single    Spouse name: Not on file  . Number of children: Not on file  . Years of education: Not on file  . Highest education level: Not on file  Occupational History  . Not on file  Social Needs  . Financial resource strain: Not on file  . Food insecurity    Worry: Not on file    Inability: Not on file  . Transportation needs    Medical: Not on file    Non-medical: Not on file  Tobacco Use  . Smoking status: Former Smoker    Packs/day: 0.25    Years: 0.50    Pack years: 0.12    Types: Cigarettes    Quit date: 09/03/2015    Years since quitting: 4.0  . Smokeless tobacco: Never Used  Substance and Sexual Activity  . Alcohol use: Yes    Comment: casually  . Drug use: No    Types: Marijuana    Comment: THC last used 2 yrs ago, unsure of amount  . Sexual activity: Not Currently    Comment: Plan B Pill  Lifestyle  . Physical activity    Days per week: Not on file    Minutes per session: Not on file  . Stress: Not on file  Relationships  . Social Musician on phone: Not on file    Gets together: Not on file    Attends religious service: Not on file    Active member of club or organization:  Not on file    Attends meetings of clubs or organizations: Not on file    Relationship status: Not on file  Other Topics Concern  . Not on file  Social History Narrative  . Not on file   Additional Social History:    Pain Medications: see MAR Prescriptions: see MAR Over the Counter: see MAR History of alcohol / drug use?: No history of alcohol / drug abuse                    Sleep: Fair  Appetite:  Fair  Current Medications: Current Facility-Administered Medications  Medication Dose Route Frequency Provider Last Rate Last Dose  . carvedilol (COREG) tablet 12.5 mg  12.5 mg Oral BID WC [name], MD   12.5 mg at 09/24/19 0755  . clonazePAM (KLONOPIN) tablet 2 mg  2 mg Oral Once [name], MD      . haloperidol lactate (HALDOL) injection 10  mg  10 mg Intramuscular Q6H PRN [name], MD   10 mg at 09/20/19 0243  . haloperidol lactate (HALDOL) injection 10 mg  10 mg Intramuscular BID PRN [name], MD      . haloperidol lactate (HALDOL) injection 10 mg  10 mg Intramuscular Once [name], MD      . ibuprofen (ADVIL) tablet 800 mg  800 mg Oral Once Lindon Romp A, NP      . lithium carbonate (LITHOBID) CR tablet 600 mg  600 mg Oral Q12H [name], MD   600 mg at 09/24/19 0755  . LORazepam (ATIVAN) tablet 2 mg  2 mg Oral Q4H PRN [name], MD   2 mg at 09/24/19 0140   Or  . LORazepam (ATIVAN) injection 2 mg  2 mg Intramuscular Q4H PRN [name], MD   2 mg at 09/17/19 1339  . LORazepam (ATIVAN) injection 4 mg  4 mg Intramuscular Once [name], MD      . neomycin-bacitracin-polymyxin (NEOSPORIN) ointment   Topical PRN [name], NP      . nicotine polacrilex (NICORETTE) gum 2 mg  2 mg Oral PRN [name], NP   2 mg at 09/22/19 1847  . OXcarbazepine (TRILEPTAL) tablet 150 mg  150 mg Oral BID [name], MD   150 mg at 09/24/19 0755  . perphenazine (TRILAFON) tablet 12 mg  12 mg Oral BID [name], MD   12 mg at 09/24/19 0755  . temazepam (RESTORIL) capsule 30 mg  30 mg Oral QHS [name], MD   30 mg at 09/23/19 2113    Lab Results: No results found for this or any previous visit (from the past 63 hour(s)).  Blood Alcohol level:  Lab Results  Component Value Date   ETH <10 09/15/2019   ETH <10 41/28/7867    Metabolic Disorder Labs: Lab Results  Component Value Date   HGBA1C 5.3 09/15/2019   MPG 105.41 09/15/2019   MPG 103 05/14/2015   Lab Results  Component Value Date   PROLACTIN 16.5 09/15/2019   PROLACTIN 18.6 06/10/2015   Lab Results  Component Value Date   CHOL 210 (H) 09/15/2019   TRIG 97 09/15/2019   HDL 60 09/15/2019   CHOLHDL 3.5 09/15/2019   VLDL 19 09/15/2019   LDLCALC 131 (H) 09/15/2019   LDLCALC 77 05/14/2015    Physical Findings: AIMS: Facial and Oral  Movements Muscles of Facial Expression: None, normal Lips and Perioral Area: None, normal Jaw: None, normal Tongue: None,  normal,Extremity Movements Upper (arms, wrists, hands, fingers): None, normal Lower (legs, knees, ankles, toes): None, normal, Trunk Movements Neck, shoulders, hips: None, normal, Overall Severity Severity of abnormal movements (highest score from questions above): None, normal Incapacitation due to abnormal movements: None, normal Patient's awareness of abnormal movements (rate only patient's report): No Awareness, Dental Status Current problems with teeth and/or dentures?: No Does patient usually wear dentures?: No  CIWA:    COWS:     Musculoskeletal: Strength & Muscle Tone: within normal limits Gait & Station: normal Patient leans: N/A  Psychiatric Specialty Exam: Physical Exam  ROS  Blood pressure (!) 120/93, pulse 97, temperature (!) 97.5 F (36.4 C), temperature source Oral, resp. rate 18, SpO2 98 %.There is no height or weight on file to calculate BMI.  General Appearance: Casual  Eye Contact:  Good  Speech:  Clear and Coherent and Pressured  Volume:  Increased  Mood:  labile-can be cordial or hostile within seconds   Affect:  Labile  Thought Process:  Linear and Descriptions of Associations: Circumstantial  Orientation:  Full (Time, Place, and Person)  Thought Content:  Tangential  Suicidal Thoughts:  No  Homicidal Thoughts:  No  Memory:  Immediate;   Poor Recent;   Fair Remote;   Fair  Judgement:  poor  Insight:  poor  Psychomotor Activity:  Increased  Concentration:  Concentration: Fair and Attention Span: Poor  Recall:  [employer]  Fund of Knowledge:  Fair  Language:  Fair  Akathisia:  Negative  Handed:  Right  AIMS (if indicated):     Assets:  Manufacturing systems engineerCommunication Skills Housing Physical Health Resilience  ADL's:  Intact  Cognition:  WNL  Sleep:  Number of Hours: 4.75     Treatment Plan Summary: Daily contact with patient to assess and  evaluate symptoms and progress in treatment and Medication management  Continue with forced medications if refuses doses of perphenazine escalate dosing as tolerable no change in precautions again she will need long-acting injectable which she resists paliperidone at this point in time a little more Haldol if she refuses paliperidone  Malvin JohnsFARAH,Cattleya Dobratz, MD 09/24/2019, 10:19 AM

## 2019-09-24 NOTE — Progress Notes (Signed)
The patient slid a note underneath a peer's doorway while this author approached her. She came out of her room a second time and repeated the behavior despite the fact that this Pryor Curia told her not to do so.

## 2019-09-24 NOTE — Progress Notes (Signed)
Pt up this morning apologized for yelling last night "good morning uncle mike I think [employer] is hiring"

## 2019-09-25 DIAGNOSIS — F250 Schizoaffective disorder, bipolar type: Secondary | ICD-10-CM | POA: Diagnosis not present

## 2019-09-25 MED ORDER — PALIPERIDONE PALMITATE ER 156 MG/ML IM SUSY
156.0000 mg | PREFILLED_SYRINGE | Freq: Once | INTRAMUSCULAR | Status: AC
  Administered 2019-09-29: 156 mg via INTRAMUSCULAR
  Filled 2019-09-25 (×2): qty 1

## 2019-09-25 MED ORDER — TEMAZEPAM 30 MG PO CAPS
30.0000 mg | ORAL_CAPSULE | Freq: Every evening | ORAL | Status: DC | PRN
  Filled 2019-09-25: qty 1

## 2019-09-25 MED ORDER — HYDROXYZINE HCL 50 MG PO TABS
50.0000 mg | ORAL_TABLET | Freq: Every day | ORAL | Status: DC
  Administered 2019-09-25 – 2019-10-06 (×12): 50 mg via ORAL
  Filled 2019-09-25 (×15): qty 1

## 2019-09-25 MED ORDER — PALIPERIDONE PALMITATE ER 234 MG/1.5ML IM SUSY
234.0000 mg | PREFILLED_SYRINGE | Freq: Once | INTRAMUSCULAR | Status: AC
  Administered 2019-09-25: 234 mg via INTRAMUSCULAR
  Filled 2019-09-25: qty 1.5

## 2019-09-25 NOTE — Progress Notes (Signed)
Patient has been calm and cooperative on the milieu.  Patient denies SI, HI and AVH.  Patient has exhibited no behavioral dyscontrol this shift.   Assess patient for safety, offer medications as prescribed, engage patient with 1:1 staff talks.   Patient able to contract for safety.  Continue to monitor as planned.

## 2019-09-25 NOTE — [facility] Suicide Risk Assessment (Signed)
[facility] INPATIENT:  Family/Significant Other Suicide Prevention Education  Suicide Prevention Education:  Education Completed; Pt's father, Lisa [name], has been identified by the patient as the family member/significant other with whom the patient will be residing, and identified as the person(s) who will aid the patient in the event of a mental health crisis (suicidal ideations/suicide attempt).  With written consent from the patient, the family member/significant other has been provided the following suicide prevention education, prior to the and/or following the discharge of the patient.  The suicide prevention education provided includes the following:  Suicide risk factors  Suicide prevention and interventions  National Suicide Hotline telephone number  [facility] assessment telephone number  [facility] Emergency Assistance [place] and/or Residential Mobile Crisis Unit telephone number  Request made of family/significant other to:  Remove weapons (e.g., guns, rifles, knives), all items previously/currently identified as safety concern.    Remove drugs/medications (over-the-counter, prescriptions, illicit drugs), all items previously/currently identified as a safety concern.  The family member/significant other verbalizes understanding of the suicide prevention education information provided.  The family member/significant other agrees to remove the items of safety concern listed above.  CSW contacted the pt's father. Pt's father stated that he just wanted to make sure that the pt's employment was taken care of and they are aware she is in the hospital. Pt's father stated that he talked with the doctor earlier and that he is up-to-date with her medications and what the doctor is trying for her now.   Lisa [name] 09/25/2019, 2:31 PM

## 2019-09-25 NOTE — Progress Notes (Signed)
Pt added Tyler Deis to her consent form this morning

## 2019-09-25 NOTE — Progress Notes (Signed)
   09/25/19 2200  Psych Admission Type (Psych Patients Only)  Admission Status Voluntary  Psychosocial Assessment  Patient Complaints Anxiety  Eye Contact Brief  Facial Expression Anxious  Affect Anxious;Preoccupied  Speech Logical/coherent;Argumentative;Aggressive;Loud;Pressured  Interaction Assertive;Attention-seeking;Flirtatious  Motor Activity Fidgety;Pacing  Appearance/Hygiene Unremarkable  Behavior Characteristics Anxious  Mood Labile  Thought Process  Coherency WDL  Content Preoccupation;Paranoia  Delusions None reported or observed  Perception WDL  Hallucination None reported or observed  Judgment Impaired  Confusion None  Danger to Self  Current suicidal ideation? Denies  Danger to Others  Danger to Others None reported or observed   D: Pt continues to be intrusive and tries to manage her care and tries to manage the care of her peers.  A: Pt was offered support and encouragement.  Pt was encourage to attend groups. Q 15 minute checks were done for safety.  R: safety maintained on unit.

## 2019-09-25 NOTE — Tx Team (Signed)
Interdisciplinary Treatment and Diagnostic Plan Update  09/25/2019 Time of Session: 10:20am Lisa [name] MRN: [id number]  Principal Diagnosis: <principal problem not specified>  Secondary Diagnoses: Active Problems:   Schizoaffective disorder, bipolar type (HCC)   Current Medications:  Current Facility-Administered Medications  Medication Dose Route Frequency Provider Last Rate Last Dose  . carvedilol (COREG) tablet 12.5 mg  12.5 mg Oral BID WC [name], MD   12.5 mg at 09/25/19 0958  . clonazePAM (KLONOPIN) tablet 2 mg  2 mg Oral Once [name], MD      . haloperidol lactate (HALDOL) injection 10 mg  10 mg Intramuscular Q6H PRN [name], MD   10 mg at 09/20/19 0243  . haloperidol lactate (HALDOL) injection 10 mg  10 mg Intramuscular BID PRN [name], MD      . haloperidol lactate (HALDOL) injection 10 mg  10 mg Intramuscular Once [name], MD      . hydrOXYzine (ATARAX/VISTARIL) tablet 50 mg  50 mg Oral QHS [name], MD      . ibuprofen (ADVIL) tablet 800 mg  800 mg Oral Once Lindon Romp A, NP      . lithium carbonate (LITHOBID) CR tablet 600 mg  600 mg Oral Q12H [name], MD   600 mg at 09/25/19 7829  . LORazepam (ATIVAN) tablet 2 mg  2 mg Oral Q4H PRN [name], MD   2 mg at 09/24/19 2245   Or  . LORazepam (ATIVAN) injection 2 mg  2 mg Intramuscular Q4H PRN [name], MD   2 mg at 09/17/19 1339  . LORazepam (ATIVAN) injection 4 mg  4 mg Intramuscular Once [name], MD      . neomycin-bacitracin-polymyxin (NEOSPORIN) ointment   Topical PRN [name], NP      . nicotine polacrilex (NICORETTE) gum 2 mg  2 mg Oral PRN [name], NP   2 mg at 09/24/19 2246  . OXcarbazepine (TRILEPTAL) tablet 150 mg  150 mg Oral BID [name], MD   150 mg at 09/24/19 1802  . [START ON 09/28/2019] paliperidone (INVEGA SUSTENNA) injection 156 mg  156 mg Intramuscular Once [name], MD      . perphenazine (TRILAFON) tablet 14 mg  14 mg Oral BID  [name], MD   14 mg at 09/25/19 0958  . temazepam (RESTORIL) capsule 30 mg  30 mg Oral QHS PRN [name], MD       PTA Medications: Medications Prior to Admission  Medication Sig Dispense Refill Last Dose  . ARIPiprazole (ABILIFY) 10 MG tablet Take 10 mg by mouth daily.     Derrill Memo ON 10/05/2019] ARIPiprazole ER (ABILIFY MAINTENA) 400 MG SRER injection Inject 2 mLs (400 mg total) into the muscle every 28 (twenty-eight) days. DUE 11/2 1 each 11 09/04/2019  . benztropine (COGENTIN) 1 MG tablet Take 1 tablet (1 mg total) by mouth 2 (two) times daily. 60 tablet 2   . carvedilol (COREG) 12.5 MG tablet Take 1 tablet (12.5 mg total) by mouth 2 (two) times daily with a meal. 60 tablet 2   . levonorgestrel-ethinyl estradiol (SEASONALE) 0.15-0.03 MG tablet Take 1 tablet by mouth daily.     [name] lithium carbonate (ESKALITH) 450 MG CR tablet Take 1 tablet (450 mg total) by mouth every 12 (twelve) hours. 60 tablet 2   . LORazepam (ATIVAN) 2 MG tablet Take 2 mg by mouth daily.     [name] omega-3 acid ethyl esters (LOVAZA) 1 g  capsule Take 1 capsule (1 g total) by mouth 2 (two) times daily. 60 capsule 11   . temazepam (RESTORIL) 30 MG capsule Take 1 capsule (30 mg total) by mouth at bedtime. 30 capsule 0   . risperiDONE (RISPERDAL) 4 MG tablet Take 2 tablets (8 mg total) by mouth at bedtime. (Patient not taking: Reported on 09/15/2019) 60 tablet 2 Not Taking at Unknown time    Patient Stressors:    Patient Strengths:    Treatment Modalities: Medication Management, Group therapy, Case management,  1 to 1 session with clinician, Psychoeducation, Recreational therapy.   Physician Treatment Plan for Primary Diagnosis: <principal problem not specified> Long Term Goal(s): Improvement in symptoms so as ready for discharge Improvement in symptoms so as ready for discharge   Short Term Goals: Ability to verbalize feelings will improve Ability to disclose and discuss suicidal ideas Ability to demonstrate  self-control will improve Ability to identify and develop effective coping behaviors will improve Ability to disclose and discuss suicidal ideas Ability to demonstrate self-control will improve Ability to identify and develop effective coping behaviors will improve Ability to maintain clinical measurements within normal limits will improve  Medication Management: Evaluate patient's response, side effects, and tolerance of medication regimen.  Therapeutic Interventions: 1 to 1 sessions, Unit Group sessions and Medication administration.  Evaluation of Outcomes: Progressing  Physician Treatment Plan for Secondary Diagnosis: Active Problems:   Schizoaffective disorder, bipolar type (HCC)  Long Term Goal(s): Improvement in symptoms so as ready for discharge Improvement in symptoms so as ready for discharge   Short Term Goals: Ability to verbalize feelings will improve Ability to disclose and discuss suicidal ideas Ability to demonstrate self-control will improve Ability to identify and develop effective coping behaviors will improve Ability to disclose and discuss suicidal ideas Ability to demonstrate self-control will improve Ability to identify and develop effective coping behaviors will improve Ability to maintain clinical measurements within normal limits will improve     Medication Management: Evaluate patient's response, side effects, and tolerance of medication regimen.  Therapeutic Interventions: 1 to 1 sessions, Unit Group sessions and Medication administration.  Evaluation of Outcomes: Progressing   RN Treatment Plan for Primary Diagnosis: <principal problem not specified> Long Term Goal(s): Knowledge of disease and therapeutic regimen to maintain health will improve  Short Term Goals: Ability to participate in decision making will improve, Ability to verbalize feelings will improve, Ability to disclose and discuss suicidal ideas, Ability to identify and develop effective  coping behaviors will improve and Compliance with prescribed medications will improve  Medication Management: RN will administer medications as ordered by provider, will assess and evaluate patient's response and provide education to patient for prescribed medication. RN will report any adverse and/or side effects to prescribing provider.  Therapeutic Interventions: 1 on 1 counseling sessions, Psychoeducation, Medication administration, Evaluate responses to treatment, Monitor vital signs and CBGs as ordered, Perform/monitor CIWA, COWS, AIMS and Fall Risk screenings as ordered, Perform wound care treatments as ordered.  Evaluation of Outcomes: Progressing   LCSW Treatment Plan for Primary Diagnosis: <principal problem not specified> Long Term Goal(s): Safe transition to appropriate next level of care at discharge, Engage patient in therapeutic group addressing interpersonal concerns.  Short Term Goals: Engage patient in aftercare planning with referrals and resources and Increase skills for wellness and recovery  Therapeutic Interventions: Assess for all discharge needs, 1 to 1 time with Social worker, Explore available resources and support systems, Assess for adequacy in community support network, Educate family and significant  other(s) on suicide prevention, Complete Psychosocial Assessment, Interpersonal group therapy.  Evaluation of Outcomes: Progressing   Progress in Treatment: Attending groups: Yes. Participating in groups: Yes. Taking medication as prescribed: Yes. Toleration medication: Yes. Family/Significant other contact made: No, will contact:  pt's father Patient understands diagnosis: Yes. Discussing patient identified problems/goals with staff: Yes. Medical problems stabilized or resolved: Yes. Denies suicidal/homicidal ideation: Yes. Issues/concerns per patient self-inventory: No. Other:   New problem(s) identified: No, Describe:  None  New Short Term/Long Term  Goal(s): Medication stabilization, elimination of SI thoughts, and development of a comprehensive mental wellness plan.   Patient Goals:  "to go home"  Discharge Plan or Barriers: Patient will be discharging home and [facility] [place]    Reason for Continuation of Hospitalization: Mania Medication stabilization  Estimated Length of Stay: 3-5 days   Attendees: Patient:  09/25/2019   Physician: Dr. Malvin JohnsBrian Farah, MD 09/25/2019   Nursing: Marton Redwoodoni, RN 09/25/2019   RN Care Manager: 09/25/2019   Social Worker: Stephannie PetersJasmine Edrie Ehrich, LCSW  09/25/2019  Recreational Therapist:  09/25/2019   Other:  09/25/2019  Other:  09/25/2019   Other: 09/25/2019     Scribe for Treatment Team: Delphia GratesJasmine M Belva Koziel, LCSW 09/25/2019 2:05 PM

## 2019-09-25 NOTE — Progress Notes (Addendum)
[facility] MD Progress Note  09/25/2019 8:05 AM ZASHA BELLEAU  MRN:  [id number] Subjective:    This is the sixth psychiatric admission for Lisa [name], and she required this admission 1 week after her last discharge on 10/6.  Despite long-acting injectable aripiprazole administered on 10/2, she did not comply with oral medications and became again acutely psychotic, manic, and requiring IM medications almost immediately upon admission.  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  With the forced medication consult she has become more compliant over the last 48 hours.  Over the last 24 hours she remains intrusive, hyperverbal, engaging every clinician or caregiver she encounters with numerous questions.  Requires near constant redirection but she is not screaming or verbally abusive at this point in time.  She does finally agree to long-acting injectable paliperidone we think it is safe to go ahead and administer this because her aripiprazole LAI should be at a subtherapeutic or close to a subtherapeutic range at this point in time.  But is currently intrusive, rambling, insisting on showing me 3 pages of dietary recommendations, other irrelevant material.  Also repeatedly asking about discharge but again she does understand risks benefits side effects of long-acting injectable paliperidone.  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  She  will also need additional/new follow-up upon discharge  Past Medical History:  Past Medical History:  Diagnosis Date  . Anxiety     Past Surgical History:  Procedure Laterality Date  . extraction of wisdom teeth     Family History:  Family History  Problem Relation Age of Onset  . Schizophrenia Mother   . Depression Father   . Seizures Sister    Family Psychiatric  History: no new data Social History:  Social History   Substance and Sexual Activity  Alcohol Use Yes   Comment: casually     Social History   Substance and Sexual Activity  Drug Use No  . Types: Marijuana   Comment: THC last used 2 yrs ago, unsure of amount    Social History   Socioeconomic History  . Marital status: Single    Spouse name: Not on file  . Number of children: Not on file  . Years of education: Not on file  . Highest education level: Not on file  Occupational History  . Not on file  Social Needs  . Financial resource strain: Not on file  . Food insecurity    Worry: Not on file    Inability: Not on file  . Transportation needs    Medical: Not on file    Non-medical: Not on file  Tobacco Use  . Smoking status: Former Smoker    Packs/day: 0.25    Years: 0.50    Pack years: 0.12    Types: Cigarettes    Quit date: 09/03/2015    Years since quitting: 4.0  . Smokeless tobacco: Never Used  Substance and Sexual Activity  . Alcohol use: Yes    Comment: casually  .  Drug use: No    Types: Marijuana    Comment: THC last used 2 yrs ago, unsure of amount  . Sexual activity: Not Currently    Comment: Plan B Pill  Lifestyle  . Physical activity    Days per week: Not on file    Minutes per session: Not on file  . Stress: Not on file  Relationships  . Social Musicianconnections    Talks on phone: Not on file    Gets together: Not on file    Attends religious service: Not on file    Active member of club or organization: Not on file    Attends meetings of clubs or organizations: Not on file     Relationship status: Not on file  Other Topics Concern  . Not on file  Social History Narrative  . Not on file   Additional Social History:    Pain Medications: see MAR Prescriptions: see MAR Over the Counter: see MAR History of alcohol / drug use?: No history of alcohol / drug abuse                    Sleep: Fair  Appetite:  Fair  Current Medications: Current Facility-Administered Medications  Medication Dose Route Frequency Provider Last Rate Last Dose  . carvedilol (COREG) tablet 12.5 mg  12.5 mg Oral BID WC Malvin JohnsFarah, Glendora Clouatre, MD   12.5 mg at 09/24/19 1804  . clonazePAM (KLONOPIN) tablet 2 mg  2 mg Oral Once Malvin JohnsFarah, Nakiya Rallis, MD      . haloperidol lactate (HALDOL) injection 10 mg  10 mg Intramuscular Q6H PRN Malvin JohnsFarah, Tuwanda Vokes, MD   10 mg at 09/20/19 0243  . haloperidol lactate (HALDOL) injection 10 mg  10 mg Intramuscular BID PRN Malvin JohnsFarah, Yacine Droz, MD      . haloperidol lactate (HALDOL) injection 10 mg  10 mg Intramuscular Once Malvin JohnsFarah, Kazden Largo, MD      . ibuprofen (ADVIL) tablet 800 mg  800 mg Oral Once Nira ConnBerry, Jason A, NP      . lithium carbonate (LITHOBID) CR tablet 600 mg  600 mg Oral Q12H Malvin JohnsFarah, Kristene Liberati, MD   600 mg at 09/24/19 2057  . LORazepam (ATIVAN) tablet 2 mg  2 mg Oral Q4H PRN Malvin JohnsFarah, Choua Chalker, MD   2 mg at 09/24/19 2245   Or  . LORazepam (ATIVAN) injection 2 mg  2 mg Intramuscular Q4H PRN Malvin JohnsFarah, Tolulope Pinkett, MD   2 mg at 09/17/19 1339  . LORazepam (ATIVAN) injection 4 mg  4 mg Intramuscular Once Malvin JohnsFarah, Decklan Mau, MD      . neomycin-bacitracin-polymyxin (NEOSPORIN) ointment   Topical PRN Jearld Leschixon, Rashaun M, NP      . nicotine polacrilex (NICORETTE) gum 2 mg  2 mg Oral PRN Jearld Leschixon, Rashaun M, NP   2 mg at 09/24/19 2246  . OXcarbazepine (TRILEPTAL) tablet 150 mg  150 mg Oral BID Malvin JohnsFarah, Kennon Encinas, MD   150 mg at 09/24/19 1802  . paliperidone (INVEGA SUSTENNA) injection 234 mg  234 mg Intramuscular Once Malvin JohnsFarah, Riven Beebe, MD      . perphenazine (TRILAFON) tablet 14 mg  14 mg Oral BID Malvin JohnsFarah, Tallula Grindle, MD   14 mg  at 09/24/19 1803  . temazepam (RESTORIL) capsule 30 mg  30 mg Oral QHS Malvin JohnsFarah, Aadya Kindler, MD   30 mg at 09/24/19 2057    Lab Results: No results found for this or any previous visit (from the past 48 hour(s)).  Blood Alcohol level:  Lab Results  Component Value Date  ETH <10 09/15/2019   ETH <10 08/31/2019    Metabolic Disorder Labs: Lab Results  Component Value Date   HGBA1C 5.3 09/15/2019   MPG 105.41 09/15/2019   MPG 103 05/14/2015   Lab Results  Component Value Date   PROLACTIN 16.5 09/15/2019   PROLACTIN 18.6 06/10/2015   Lab Results  Component Value Date   CHOL 210 (H) 09/15/2019   TRIG 97 09/15/2019   HDL 60 09/15/2019   CHOLHDL 3.5 09/15/2019   VLDL 19 09/15/2019   LDLCALC 131 (H) 09/15/2019   LDLCALC 77 05/14/2015    Physical Findings: AIMS: Facial and Oral Movements Muscles of Facial Expression: None, normal Lips and Perioral Area: None, normal Jaw: None, normal Tongue: None, normal,Extremity Movements Upper (arms, wrists, hands, fingers): None, normal Lower (legs, knees, ankles, toes): None, normal, Trunk Movements Neck, shoulders, hips: None, normal, Overall Severity Severity of abnormal movements (highest score from questions above): None, normal Incapacitation due to abnormal movements: None, normal Patient's awareness of abnormal movements (rate only patient's report): No Awareness, Dental Status Current problems with teeth and/or dentures?: No Does patient usually wear dentures?: No  CIWA:    COWS:     Musculoskeletal: Strength & Muscle Tone: within normal limits Gait & Station: normal Patient leans: N/A  Psychiatric Specialty Exam: Physical Exam  ROS  Blood pressure 107/70, pulse 96, temperature 97.7 F (36.5 C), temperature source Oral, resp. rate 18, SpO2 98 %.There is no height or weight on file to calculate BMI.  General Appearance: Casual  Eye Contact:  Good  Speech:  Clear and Coherent and Pressured  Volume:  Increased  Mood:   Generally hypomanic but redirectable  Affect:  Congruent  Thought Process:  Irrelevant and Descriptions of Associations: Loose  Orientation:  Full (Time, Place, and Person)  Thought Content:  Probably racing and again jumps from topic to topic, consumed with irrelevant statements and material  Suicidal Thoughts:  No  Homicidal Thoughts:  No  Memory:  Immediate;   Fair Recent;   Fair Remote;   Good  Judgement:  Poor  Insight:  Shallow  Psychomotor Activity:  Increased  Concentration:  Concentration: Fair and Attention Span: Fair  Recall:  [employer] of Knowledge:  Fair  Language:  Fair  Akathisia:  Negative  Handed:  Right  AIMS (if indicated):     Assets:  Health and safety inspector Housing Physical Health Resilience Social Support  ADL's:  Intact  Cognition:  WNL  Sleep:  Number of Hours: 4.75     Treatment Plan Summary: Daily contact with patient to assess and evaluate symptoms and progress in treatment and Medication management  For bipolar mania/psychosis in the context of an exacerbation of schizoaffective-continue Trilafon orally, continue lithium and, Trileptal off label however patient does agree to long-acting injectable paliperidone which we will administer today as far as the first dosing, to 234 mg./Schedule follow-up for Monday at 156 mg  Continue reality based therapy  Continue to keep father informed with her permission  Continue current 15-minute precautions  Chondra Boyde, MD 09/25/2019, 8:05 AM

## 2019-09-25 NOTE — Progress Notes (Signed)
Recreation Therapy Notes  Date: 10.23.20 Time: 1000 Location: 500 Hall  Group Topic: Team Building  Goal Area(s) Addresses:  Patient will effectively work with group towards shared goal. Patient will identify skills needed to complete activity. Patient will verbalize how skills can be used post d/c.  Behavioral Response:  Engaged  Intervention: Group Activity  Activity:  Sharks in [employer].  Each patient was given are rubber disc and as a whole the group was given a disc.  Patients were to work together using the disc to maneuver the group from one end of the hall to the other and back.  If anyone stepped off their disc the group would start over.  Education: Team Building, Discharge Planning  Education Outcome: Acknowledges understanding/In group clarification offered/Needs additional education.   Clinical Observations/Feedback: Pt listened to everyone's ideas on how to complete the activity.  Pt was engaged, focused and able to work with the group.  Pt was patient when others didn't move as fast as she thought they should.    Victorino Sparrow, LRT/CTRS         Ria Comment, Jesus Nevills A 09/25/2019 10:50 AM

## 2019-09-25 NOTE — Progress Notes (Signed)
Patient ID: Lisa [name], female   DOB: 11/24/1991, 28 y.o.   MRN: [id number]   CSW met with patient to discuss her wants to discharge. Patient stated that she needs to get back to work on Monday and wants to discharge. CSW gave fax number to patient to give to her employer to send over [facility] paperwork. Patient stated that the doctor told her that he would speak with her father, Edd Arbour, to discuss the patient going home.

## 2019-09-26 DIAGNOSIS — F250 Schizoaffective disorder, bipolar type: Secondary | ICD-10-CM | POA: Diagnosis not present

## 2019-09-26 MED ORDER — TEMAZEPAM 15 MG PO CAPS
45.0000 mg | ORAL_CAPSULE | Freq: Every evening | ORAL | Status: DC | PRN
  Administered 2019-09-29: 45 mg via ORAL
  Filled 2019-09-26: qty 3

## 2019-09-26 NOTE — Progress Notes (Signed)
[facility] MD Progress Note  09/26/2019 10:25 AM Lisa [name]  MRN:  [id number] Subjective:   Patient is seen and examined.  Patient is a 28 year old female with a past psychiatric history significant for schizoaffective disorder; bipolar type versus bipolar disorder.  The patient was admitted on 09/15/2019 secondary to bizarre delusions and worsening psychosis.   Objective: Patient is seen and examined.  Patient is a 28 year old female with the above-stated past psychiatric history who is seen in follow-up.  Review of the electronic medical record from yesterday's note revealed that she had agreed to the long-acting injectable paliperidone.  She was given the 234 mg dose on 09/25/2019.  She is scheduled for the 156 mg dose on 09/28/2019.  She also continues on Trileptal 150 mg p.o. twice daily and lithium carbonate CR 600 mg p.o. twice daily.  She is also prescribed Trilafon 14 mg p.o. twice daily.  Her vital signs are stable with regard to her blood pressure and temperature.  Her pulse was 122 this morning.  Her last EKG was on 08/31/2019.  It showed a sinus tachycardia with a QTC of 454.  Review of her laboratories showed an elevated AST of 115 and an ALT of 75 on 10/13.  3 weeks prior to that they were both within normal limits.  Her lithium level on 10/16 was 0.45.  She slept 4.75 hours last night.  She still remains mildly manic.  She is slightly pressured.  She denies all symptoms today.  She was requesting discharge, but I reminded her that her second injection for the paliperidone is not until the 26.  She denied any suicidal or homicidal ideation.  She denied any auditory or visual hallucinations.  Principal Problem: <principal problem not specified> Diagnosis: Active Problems:   Schizoaffective disorder, bipolar type ([place])  Total Time spent with patient: 30 minutes  Past Psychiatric History:   Past Medical History:  Past Medical History:  Diagnosis Date  . Anxiety     Past Surgical  History:  Procedure Laterality Date  . extraction of wisdom teeth     Family History:  Family History  Problem Relation Age of Onset  . Schizophrenia Mother   . Depression Father   . Seizures Sister    Family Psychiatric  History: See admission H&P Social History:  Social History   Substance and Sexual Activity  Alcohol Use Yes   Comment: casually     Social History   Substance and Sexual Activity  Drug Use No  . Types: Marijuana   Comment: THC last used 2 yrs ago, unsure of amount    Social History   Socioeconomic History  . Marital status: Single    Spouse name: Not on file  . Number of children: Not on file  . Years of education: Not on file  . Highest education level: Not on file  Occupational History  . Not on file  Social Needs  . Financial resource strain: Not on file  . Food insecurity    Worry: Not on file    Inability: Not on file  . Transportation needs    Medical: Not on file    Non-medical: Not on file  Tobacco Use  . Smoking status: Former Smoker    Packs/day: 0.25    Years: 0.50    Pack years: 0.12    Types: Cigarettes    Quit date: 09/03/2015    Years since quitting: 4.0  . Smokeless tobacco: Never Used  Substance and Sexual Activity  .  Alcohol use: Yes    Comment: casually  . Drug use: No    Types: Marijuana    Comment: THC last used 2 yrs ago, unsure of amount  . Sexual activity: Not Currently    Comment: Plan B Pill  Lifestyle  . Physical activity    Days per week: Not on file    Minutes per session: Not on file  . Stress: Not on file  Relationships  . Social Musician on phone: Not on file    Gets together: Not on file    Attends religious service: Not on file    Active member of club or organization: Not on file    Attends meetings of clubs or organizations: Not on file    Relationship status: Not on file  Other Topics Concern  . Not on file  Social History Narrative  . Not on file   Additional Social  History:    Pain Medications: see MAR Prescriptions: see MAR Over the Counter: see MAR History of alcohol / drug use?: No history of alcohol / drug abuse                    Sleep: Fair  Appetite:  Fair  Current Medications: Current Facility-Administered Medications  Medication Dose Route Frequency Provider Last Rate Last Dose  . carvedilol (COREG) tablet 12.5 mg  12.5 mg Oral BID WC Malvin Johns, MD   12.5 mg at 09/26/19 0747  . clonazePAM (KLONOPIN) tablet 2 mg  2 mg Oral Once Malvin Johns, MD      . haloperidol lactate (HALDOL) injection 10 mg  10 mg Intramuscular Q6H PRN Malvin Johns, MD   10 mg at 09/20/19 0243  . haloperidol lactate (HALDOL) injection 10 mg  10 mg Intramuscular BID PRN Malvin Johns, MD      . haloperidol lactate (HALDOL) injection 10 mg  10 mg Intramuscular Once Malvin Johns, MD      . hydrOXYzine (ATARAX/VISTARIL) tablet 50 mg  50 mg Oral QHS Malvin Johns, MD   50 mg at 09/25/19 2121  . lithium carbonate (LITHOBID) CR tablet 600 mg  600 mg Oral Q12H Malvin Johns, MD   600 mg at 09/26/19 0747  . LORazepam (ATIVAN) tablet 2 mg  2 mg Oral Q4H PRN Malvin Johns, MD   2 mg at 09/24/19 2245   Or  . LORazepam (ATIVAN) injection 2 mg  2 mg Intramuscular Q4H PRN Malvin Johns, MD   2 mg at 09/17/19 1339  . LORazepam (ATIVAN) injection 4 mg  4 mg Intramuscular Once Malvin Johns, MD      . neomycin-bacitracin-polymyxin (NEOSPORIN) ointment   Topical PRN Jearld Lesch, NP      . nicotine polacrilex (NICORETTE) gum 2 mg  2 mg Oral PRN Jearld Lesch, NP   2 mg at 09/26/19 0755  . OXcarbazepine (TRILEPTAL) tablet 150 mg  150 mg Oral BID Malvin Johns, MD   150 mg at 09/26/19 0747  . [START ON 09/28/2019] paliperidone (INVEGA SUSTENNA) injection 156 mg  156 mg Intramuscular Once Malvin Johns, MD      . perphenazine (TRILAFON) tablet 14 mg  14 mg Oral BID Malvin Johns, MD   14 mg at 09/26/19 0747  . temazepam (RESTORIL) capsule 30 mg  30 mg Oral QHS PRN Malvin Johns,  MD        Lab Results: No results found for this or any previous visit (from the past 48 hour(s)).  Blood Alcohol level:  Lab Results  Component Value Date   ETH <10 09/15/2019   ETH <10 08/31/2019    Metabolic Disorder Labs: Lab Results  Component Value Date   HGBA1C 5.3 09/15/2019   MPG 105.41 09/15/2019   MPG 103 05/14/2015   Lab Results  Component Value Date   PROLACTIN 16.5 09/15/2019   PROLACTIN 18.6 06/10/2015   Lab Results  Component Value Date   CHOL 210 (H) 09/15/2019   TRIG 97 09/15/2019   HDL 60 09/15/2019   CHOLHDL 3.5 09/15/2019   VLDL 19 09/15/2019   LDLCALC 131 (H) 09/15/2019   LDLCALC 77 05/14/2015    Physical Findings: AIMS: Facial and Oral Movements Muscles of Facial Expression: None, normal Lips and Perioral Area: None, normal Jaw: None, normal Tongue: None, normal,Extremity Movements Upper (arms, wrists, hands, fingers): None, normal Lower (legs, knees, ankles, toes): None, normal, Trunk Movements Neck, shoulders, hips: None, normal, Overall Severity Severity of abnormal movements (highest score from questions above): None, normal Incapacitation due to abnormal movements: None, normal Patient's awareness of abnormal movements (rate only patient's report): No Awareness, Dental Status Current problems with teeth and/or dentures?: No Does patient usually wear dentures?: No  CIWA:    COWS:     Musculoskeletal: Strength & Muscle Tone: within normal limits Gait & Station: normal Patient leans: N/A  Psychiatric Specialty Exam: Physical Exam  Nursing note and vitals reviewed. Constitutional: She appears well-developed and well-nourished.  HENT:  Head: Normocephalic and atraumatic.  Respiratory: Effort normal.  Neurological: She is alert.    ROS  Blood pressure 124/85, pulse (!) 122, temperature 98 F (36.7 C), temperature source Oral, resp. rate 18, SpO2 98 %.There is no height or weight on file to calculate BMI.  General Appearance:  Disheveled  Eye Contact:  Good  Speech:  Pressured  Volume:  Increased  Mood:  Anxious and Irritable  Affect:  Labile  Thought Process:  Coherent and Descriptions of Associations: Circumstantial  Orientation:  Full (Time, Place, and Person)  Thought Content:  Rumination  Suicidal Thoughts:  No  Homicidal Thoughts:  No  Memory:  Immediate;   Fair Recent;   Fair Remote;   Fair  Judgement:  Intact  Insight:  Lacking  Psychomotor Activity:  Increased  Concentration:  Concentration: Fair and Attention Span: Fair  Recall:  [employer] of Knowledge:  Fair  Language:  Good  Akathisia:  Negative  Handed:  Right  AIMS (if indicated):     Assets:  Desire for Improvement Resilience  ADL's:  Intact  Cognition:  WNL  Sleep:  Number of Hours: 4.75     Treatment Plan Summary: Daily contact with patient to assess and evaluate symptoms and progress in treatment, Medication management and Plan : Patient is seen and examined.  Patient is a 28 year old female with the above-stated past psychiatric history who is seen in follow-up.   Diagnosis: #1 schizoaffective disorder; bipolar type, #2 abnormal liver function enzymes, #3 tachycardia  Patient is seen in follow-up.  I had seen the patient earlier for forced medication protocol at that time.  She is improved from that, but still mildly manic to hypomanic.  She remains on lithium, Trileptal, the paliperidone long-acting injection, Trilafon and Restoril.  She is still not sleeping great, so I will increase her temazepam in the short run to 45 mg p.o. nightly as needed.  No other changes in her medications at this point.  I will order a metabolic panel given her hypokalemia  and her abnormal liver function enzymes to make sure that they are improving.  Additionally, because of her tachycardia, and the antipsychotic medication load at this point I will repeat her EKG.  She did state that she had a history of a thyroid cyst, but her TSH on admission was  normal at 1.156. 1.  Continue carvedilol 12.5 mg p.o. twice daily for hypertension and tachycardia. 2.  Continue Lithobid CR 600 mg p.o. every 12 hours for mood stability. 3.  Continue lorazepam as needed agitation protocol. 4.  Continue Trileptal 150 mg p.o. twice daily for mood stability. 5.  Patient is scheduled for the 156 mg paliperidone long-acting injection on 09/28/2019 for mood stability and psychosis. 6.  Continue Trilafon 14 mg p.o. twice daily for psychosis and mood stability. 7.  Increase temazepam to 45 mg p.o. nightly as needed insomnia. 8.  Repeat metabolic panel for hypokalemia as well as abnormal liver function enzymes. 9.  Repeat EKG secondary to continued tachycardia and antipsychotic treatment. 10.  Disposition planning-in progress.  Antonieta PertGreg Lawson Clary, MD 09/26/2019, 10:25 AM

## 2019-09-26 NOTE — Progress Notes (Signed)
DAR NOTE: Patient presents with anxious affect and mood.  Denies suicidal thoughts, pain, auditory and visual hallucinations.  Described energy level as normal and concentration as good.  Rates depression at 1, hopelessness at 3, and anxiety at 3.  Maintained on routine safety checks.  Medications given as prescribed.  Support and encouragement offered as needed.  Attended group and participated.  States goal for today is "go home."  Patient observed socializing with peers in the dayroom.  Patient is safe on and off the unit.  Offered no complaint.

## 2019-09-26 NOTE — [facility] Group Notes (Signed)
  [facility] LCSW Group Therapy Note  Date/Time:  09/26/2019 11:15AM-12:00PM  Type of Therapy and Topic:  Group Therapy:  Feelings About Hospitalization  Participation Level:  Active   Description of Group This process group involved patients discussing their feelings related to being hospitalized, as well as the benefits they see to being in the hospital.  These feelings and benefits were itemized.  The group then brainstormed specific ways in which they could seek those same benefits when they discharge and return home.  Therapeutic Goals 1. Patient will identify and describe positive and negative feelings related to hospitalization 2. Patient will verbalize benefits of hospitalization to themselves personally 3. Patients will brainstorm together ways they can obtain similar benefits in the outpatient setting, identify barriers to wellness and possible solutions  Summary of Patient Progress:  The patient expressed her primary feelings about being hospitalized are good now because she is ready to leave.  She displayed far more insight than previously and spoke without being intrusive.  Therapeutic Modalities Cognitive Behavioral Therapy Motivational Interviewing    Selmer Dominion, LCSW 09/26/2019, 1:58 PM

## 2019-09-26 NOTE — Progress Notes (Signed)
Dar Note: Education officer, museum showed up on the unit to speak to patient for making 911 call.  Patient state she is being overmedicated and is uncomfortable with staff on the unit.  Patient educated and reminded about hospital rules and protocols.  Patient verbalizes understanding.  Patient is safe on the unit.

## 2019-09-26 NOTE — Progress Notes (Signed)
Pt coming up to the nursing station constantly requesting something  Not relevant. Pt presenting with attention seeking behaviors.

## 2019-09-26 NOTE — Progress Notes (Signed)
Adult Psychoeducational Group Note  Date:  09/26/2019 Time:  1:25 AM  Group Topic/Focus:  Wrap-Up Group:   The focus of this group is to help patients review their daily goal of treatment and discuss progress on daily workbooks.  Participation Level:  Active  Participation Quality:  Appropriate  Affect:  Appropriate  Cognitive:  Appropriate  Insight: Appropriate  Engagement in Group:  Developing/Improving  Modes of Intervention:  Discussion  Additional Comments:   Pt stated her goal for todaywas to talk with her doctor about her dischargeplan. Pt stated she was able to talk with her doctor today but they did not discuss her discharge plan.Pt stated her relationship with her family has improved since she was admitted here.Pt stated she felt about the same about herself today. Pt rated her overall day a7out of 10. Pt stated her appetite was pretty goodtoday. Pt stated her goal for tonight is to get some sleep. Pt did not complain of any pain tonight. Pt stated she was not hearing voices or seeing anything that was not there. Pt stated she had no thoughts of harming herself or others. Pt stated she would alert staff if anything changes  Candy Sledge 09/26/2019, 1:25 AM

## 2019-09-27 DIAGNOSIS — F250 Schizoaffective disorder, bipolar type: Secondary | ICD-10-CM | POA: Diagnosis not present

## 2019-09-27 LAB — COMPREHENSIVE METABOLIC PANEL
ALT: 32 U/L (ref 0–44)
AST: 35 U/L (ref 15–41)
Albumin: 3.8 g/dL (ref 3.5–5.0)
Alkaline Phosphatase: 59 U/L (ref 38–126)
Anion gap: 8 (ref 5–15)
BUN: 10 mg/dL (ref 6–20)
CO2: 24 mmol/L (ref 22–32)
Calcium: 8.8 mg/dL — ABNORMAL LOW (ref 8.9–10.3)
Chloride: 107 mmol/L (ref 98–111)
Creatinine, Ser: 0.89 mg/dL (ref 0.44–1.00)
GFR calc Af Amer: >60 mL/min (ref >60)
GFR calc non Af Amer: >60 mL/min (ref >60)
Glucose, Bld: 69 mg/dL — ABNORMAL LOW (ref 70–99)
Potassium: 4.3 mmol/L (ref 3.5–5.1)
Sodium: 139 mmol/L (ref 135–145)
Total Bilirubin: 0.6 mg/dL (ref 0.3–1.2)
Total Protein: 6.9 g/dL (ref 6.5–8.1)

## 2019-09-27 MED ORDER — POTASSIUM CHLORIDE CRYS ER 20 MEQ PO TBCR
20.0000 meq | EXTENDED_RELEASE_TABLET | Freq: Once | ORAL | Status: AC
  Administered 2019-09-27: 20 meq via ORAL
  Filled 2019-09-27: qty 1

## 2019-09-27 MED ORDER — PERPHENAZINE 16 MG PO TABS
16.0000 mg | ORAL_TABLET | Freq: Two times a day (BID) | ORAL | Status: DC
  Administered 2019-09-27 – 2019-10-07 (×19): 16 mg via ORAL
  Filled 2019-09-27 (×22): qty 1

## 2019-09-27 MED ORDER — OXCARBAZEPINE 300 MG PO TABS
300.0000 mg | ORAL_TABLET | Freq: Two times a day (BID) | ORAL | Status: DC
  Administered 2019-09-27 – 2019-10-05 (×15): 300 mg via ORAL
  Filled 2019-09-27 (×20): qty 1

## 2019-09-27 NOTE — Progress Notes (Signed)
Patient has been up in the dayroom watching tv. Patient currently denies having pain, -si/hi/a/v hall. She has been calmer and less confrontational. She was compliant with her medications Support and encouragement offered, safety maintained on unit, will continue to monitor.

## 2019-09-27 NOTE — Progress Notes (Signed)
Patient ID: Lisa [name], female   DOB: 03/08/1991, 28 y.o.   MRN: [id number] DAR Note: Patient is calm and cooperative with care, she denies SI/HI/AVH, has eaten all of her meals for today and also taken all meds so far as scheduled.  Pt was mostly reclusive to her room through entire shift.  Q15 minute checks maintained, will continue to monitor.

## 2019-09-27 NOTE — Progress Notes (Signed)
Adult Psychoeducational Group Note  Date:  09/27/2019 Time:  10:18 PM  Group Topic/Focus:  Wrap-Up Group:   The focus of this group is to help patients review their daily goal of treatment and discuss progress on daily workbooks.  Participation Level:  Active  Participation Quality:  Appropriate  Affect:  Appropriate  Cognitive:  Appropriate  Insight: Appropriate  Engagement in Group:  Developing/Improving  Modes of Intervention:  Discussion  Additional Comments:  Pt stated her goal for today was to talk with her doctor about her discharge plan. Pt stated she accomplished her goal today and the plan is for her to be discharge on Monday. Pt stated her relationship with her family and support system has improved since she was admitted here. Pt stated her mother coming for visitation help improve her day.  Pt stated she felt better about herself today. Pt rated her over all day a 10. Pt stated her appetite was pretty good today. Pt stated her goal for tonight was to get some sleep. Pt stated she was in no pain this evening. Pt stated she was not hearing or seeing anything that was not there. Pt stated she had no thoughts of harming herself or others. Pt stated if anything change she would alert staff.  Candy Sledge 09/27/2019, 10:18 PM

## 2019-09-27 NOTE — [facility] Group Notes (Signed)
[place] LCSW Group Therapy Note  Date/Time:  09/27/2019  11:00AM-12:00PM  Type of Therapy and Topic:  Group Therapy:  Music and Mood  Participation Level:  Minimal   Description of Group: In this process group, members listened to a variety of genres of music and identified that different types of music evoke different responses.  Patients were encouraged to identify music that was soothing for them and music that was energizing for them.  Patients discussed how this knowledge can help with wellness and recovery in various ways including managing depression and anxiety as well as encouraging healthy sleep habits.    Therapeutic Goals: 1. Patients will explore the impact of different varieties of music on mood 2. Patients will verbalize the thoughts they have when listening to different types of music 3. Patients will identify music that is soothing to them as well as music that is energizing to them 4. Patients will discuss how to use this knowledge to assist in maintaining wellness and recovery 5. Patients will explore the use of music as a coping skill  Summary of Patient Progress:  At the beginning of group, patient was not present, and she arrived only in time to listen to one song then left immediately afterward.    Therapeutic Modalities: Solution Focused Brief Therapy Activity   Selmer Dominion, LCSW

## 2019-09-27 NOTE — Progress Notes (Signed)
[facility] MD Progress Note  09/27/2019 10:25 AM Lisa [name]  MRN:  [id number] Subjective:  Patient is seen and examined.  Patient is a 28 year old female with a past psychiatric history significant for schizoaffective disorder; bipolar type versus bipolar disorder.  The patient was admitted on 09/15/2019 secondary to bizarre delusions and worsening psychosis.  Objective: Patient is seen and examined.  Patient is a 28 year old female with the above-stated past psychiatric history who is seen in follow-up.  Review of the nursing notes from last p.m. showed that the patient appeared to be calm and less confrontational.  This morning she appears to be significantly manic.  She is on the telephone multiple times, intrusive and wanting to see me repeatedly.  She was apparently compliant with her medications last night.  Her vital signs are stable, she is afebrile.  She still only slept 3.5 hours last night.  EKG from yesterday showed a normal sinus rhythm with a normal QTC.  Patient stated today that she feels "fine".  She is agitated, been on the telephone multiple times, pressured, irritable.  I gave her the option of increasing her medications.  She stated that she wanted know whether or not I wanted subjective or objective questions.  After that she became significantly agitated and had to leave the room.  Principal Problem: <principal problem not specified> Diagnosis: Active Problems:   Schizoaffective disorder, bipolar type (HCC)  Total Time spent with patient: 30 minutes  Past Psychiatric History: See admission H&P  Past Medical History:  Past Medical History:  Diagnosis Date  . Anxiety     Past Surgical History:  Procedure Laterality Date  . extraction of wisdom teeth     Family History:  Family History  Problem Relation Age of Onset  . Schizophrenia Mother   . Depression Father   . Seizures Sister    Family Psychiatric  History: See admission H&P Social History:  Social History    Substance and Sexual Activity  Alcohol Use Yes   Comment: casually     Social History   Substance and Sexual Activity  Drug Use No  . Types: Marijuana   Comment: THC last used 2 yrs ago, unsure of amount    Social History   Socioeconomic History  . Marital status: Single    Spouse name: Not on file  . Number of children: Not on file  . Years of education: Not on file  . Highest education level: Not on file  Occupational History  . Not on file  Social Needs  . Financial resource strain: Not on file  . Food insecurity    Worry: Not on file    Inability: Not on file  . Transportation needs    Medical: Not on file    Non-medical: Not on file  Tobacco Use  . Smoking status: Former Smoker    Packs/day: 0.25    Years: 0.50    Pack years: 0.12    Types: Cigarettes    Quit date: 09/03/2015    Years since quitting: 4.0  . Smokeless tobacco: Never Used  Substance and Sexual Activity  . Alcohol use: Yes    Comment: casually  . Drug use: No    Types: Marijuana    Comment: THC last used 2 yrs ago, unsure of amount  . Sexual activity: Not Currently    Comment: Plan B Pill  Lifestyle  . Physical activity    Days per week: Not on file    Minutes per session:  Not on file  . Stress: Not on file  Relationships  . Social Musicianconnections    Talks on phone: Not on file    Gets together: Not on file    Attends religious service: Not on file    Active member of club or organization: Not on file    Attends meetings of clubs or organizations: Not on file    Relationship status: Not on file  Other Topics Concern  . Not on file  Social History Narrative  . Not on file   Additional Social History:    Pain Medications: see MAR Prescriptions: see MAR Over the Counter: see MAR History of alcohol / drug use?: No history of alcohol / drug abuse                    Sleep: Poor  Appetite:  Fair  Current Medications: Current Facility-Administered Medications  Medication  Dose Route Frequency Provider Last Rate Last Dose  . carvedilol (COREG) tablet 12.5 mg  12.5 mg Oral BID WC Malvin JohnsFarah, Brian, MD   12.5 mg at 09/27/19 0750  . clonazePAM (KLONOPIN) tablet 2 mg  2 mg Oral Once Malvin JohnsFarah, Brian, MD      . haloperidol lactate (HALDOL) injection 10 mg  10 mg Intramuscular Q6H PRN Malvin JohnsFarah, Brian, MD   10 mg at 09/20/19 0243  . haloperidol lactate (HALDOL) injection 10 mg  10 mg Intramuscular BID PRN Malvin JohnsFarah, Brian, MD      . haloperidol lactate (HALDOL) injection 10 mg  10 mg Intramuscular Once Malvin JohnsFarah, Brian, MD      . hydrOXYzine (ATARAX/VISTARIL) tablet 50 mg  50 mg Oral QHS Malvin JohnsFarah, Brian, MD   50 mg at 09/26/19 2100  . lithium carbonate (LITHOBID) CR tablet 600 mg  600 mg Oral Q12H Malvin JohnsFarah, Brian, MD   600 mg at 09/27/19 0750  . LORazepam (ATIVAN) tablet 2 mg  2 mg Oral Q4H PRN Malvin JohnsFarah, Brian, MD   2 mg at 09/24/19 2245   Or  . LORazepam (ATIVAN) injection 2 mg  2 mg Intramuscular Q4H PRN Malvin JohnsFarah, Brian, MD   2 mg at 09/17/19 1339  . LORazepam (ATIVAN) injection 4 mg  4 mg Intramuscular Once Malvin JohnsFarah, Brian, MD      . neomycin-bacitracin-polymyxin (NEOSPORIN) ointment   Topical PRN Jearld Leschixon, Rashaun M, NP      . nicotine polacrilex (NICORETTE) gum 2 mg  2 mg Oral PRN Jearld Leschixon, Rashaun M, NP   2 mg at 09/26/19 2101  . OXcarbazepine (TRILEPTAL) tablet 150 mg  150 mg Oral BID Malvin JohnsFarah, Brian, MD   150 mg at 09/27/19 0750  . [START ON 09/28/2019] paliperidone (INVEGA SUSTENNA) injection 156 mg  156 mg Intramuscular Once Malvin JohnsFarah, Brian, MD      . perphenazine (TRILAFON) tablet 14 mg  14 mg Oral BID Malvin JohnsFarah, Brian, MD   14 mg at 09/27/19 0750  . temazepam (RESTORIL) capsule 45 mg  45 mg Oral QHS PRN Antonieta Pertlary,  Lawson, MD        Lab Results: No results found for this or any previous visit (from the past 48 hour(s)).  Blood Alcohol level:  Lab Results  Component Value Date   [facility]   ETH <10 08/31/2019    Metabolic Disorder Labs: Lab Results  Component Value Date   HGBA1C 5.3  09/15/2019   MPG 105.41 09/15/2019   MPG 103 05/14/2015   Lab Results  Component Value Date   PROLACTIN 16.5 09/15/2019   PROLACTIN  18.6 06/10/2015   Lab Results  Component Value Date   CHOL 210 (H) 09/15/2019   TRIG 97 09/15/2019   HDL 60 09/15/2019   CHOLHDL 3.5 09/15/2019   VLDL 19 09/15/2019   LDLCALC 131 (H) 09/15/2019   LDLCALC 77 05/14/2015    Physical Findings: AIMS: Facial and Oral Movements Muscles of Facial Expression: None, normal Lips and Perioral Area: None, normal Jaw: None, normal Tongue: None, normal,Extremity Movements Upper (arms, wrists, hands, fingers): None, normal Lower (legs, knees, ankles, toes): None, normal, Trunk Movements Neck, shoulders, hips: None, normal, Overall Severity Severity of abnormal movements (highest score from questions above): None, normal Incapacitation due to abnormal movements: None, normal Patient's awareness of abnormal movements (rate only patient's report): No Awareness, Dental Status Current problems with teeth and/or dentures?: No Does patient usually wear dentures?: No  CIWA:    COWS:     Musculoskeletal: Strength & Muscle Tone: within normal limits Gait & Station: normal Patient leans: N/A  Psychiatric Specialty Exam: Physical Exam  Nursing note and vitals reviewed. Constitutional: She is oriented to person, place, and time. She appears well-developed and well-nourished.  HENT:  Head: Normocephalic and atraumatic.  Respiratory: Effort normal.  Neurological: She is alert and oriented to person, place, and time.    ROS  Blood pressure 116/71, pulse (!) 107, temperature 97.9 F (36.6 C), temperature source Oral, resp. rate 18, SpO2 98 %.There is no height or weight on file to calculate BMI.  General Appearance: Casual  Eye Contact:  Good  Speech:  Pressured  Volume:  Increased  Mood:  Anxious, Dysphoric and Irritable  Affect:  Labile  Thought Process:  Coherent and Descriptions of Associations: Tangential   Orientation:  Full (Time, Place, and Person)  Thought Content:  Rumination and Tangential  Suicidal Thoughts:  No  Homicidal Thoughts:  No  Memory:  Immediate;   Fair Recent;   Fair Remote;   Fair  Judgement:  Impaired  Insight:  Lacking  Psychomotor Activity:  Increased  Concentration:  Concentration: Poor and Attention Span: Poor  Recall:  [employer] of Knowledge:  Fair  Language:  Good  Akathisia:  Negative  Handed:  Right  AIMS (if indicated):     Assets:  Desire for Improvement Resilience  ADL's:  Intact  Cognition:  WNL  Sleep:  Number of Hours: 3.5     Treatment Plan Summary: Daily contact with patient to assess and evaluate symptoms and progress in treatment, Medication management and Plan : Patient is seen and examined.  Patient is a 28 year old female with the above-stated past psychiatric history is seen in follow-up.  Diagnosis: #1 schizoaffective disorder; bipolar type, #2 abnormal liver function enzymes, #3 tachycardia  Patient is seen in follow-up.  She still labile and manic.  I am going to increase her Trileptal to 300 mg p.o. twice daily.  I am going to increase her Trilafon to 16 mg p.o. twice daily.  Because of her increased agitation right now we will probably give her the forced Haldol injection.  She is scheduled for the second Invega injection tomorrow.  Hopefully we can calm her down.  She has refused to get the blood work done for her hypokalemia as well as abnormal liver function enzymes.  I will just add 20 mEq of KCl today. 1.  Continue carvedilol 12.5 mg p.o. twice daily for hypertension and tachycardia. 2.  Continue Lithobid CR 600 mg p.o. every 12 hours for mood stability. 3.  Continue lorazepam as  needed agitation protocol. 4.  Increase Trileptal to 300 mg p.o. twice daily for mood stability. 5.  Patient is scheduled for the 156 mg paliperidone long-acting injection on 09/28/2019 for mood stability and psychosis. 6.  Continue Trilafon 14 mg p.o.  twice daily for psychosis and mood stability. 7.    Continue temazepam to 45 mg p.o. nightly as needed insomnia. 8.    Add 20 mEq of KCl p.o. x1 9.    Use of Haldol IM for forced medications if necessary. 10.  Repeat EKG secondary to continued tachycardia and antipsychotic treatment. 11.  Disposition planning-in progress.  Sharma Covert, MD 09/27/2019, 10:25 AM

## 2019-09-28 DIAGNOSIS — F250 Schizoaffective disorder, bipolar type: Secondary | ICD-10-CM | POA: Diagnosis not present

## 2019-09-28 NOTE — Progress Notes (Signed)
Patient has been up and active on the unit, attended group this evening and has voiced no complaints. Patient currently denies having pain, -si/hi/a/v hall. Support and encouragement offered, safety maintained on unit, will continue to monitor.  

## 2019-09-28 NOTE — Progress Notes (Signed)
Pt coming to the nursing station numerous times wanting to engage and talk about nonsense things. Pt informed that writer would be willing to listen to whatever she wanted to talk about at 6 am.
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
## 2019-09-28 NOTE — [facility] Group Notes (Signed)
  [facility] LCSW Group Therapy Note  Date/Time: 09/28/2019 @ 1:30pm  Type of Therapy/Topic:  Group Therapy:  Emotion Regulation  Participation Level:  Active   Mood: Pleasant  Description of Group:    The purpose of this group is to assist patients in learning to regulate negative emotions and experience positive emotions. Patients will be guided to discuss ways in which they have been vulnerable to their negative emotions. These vulnerabilities will be juxtaposed with experiences of positive emotions or situations, and patients challenged to use positive emotions to combat negative ones. Special emphasis will be placed on coping with negative emotions in conflict situations, and patients will process healthy conflict resolution skills.  Therapeutic Goals: 1. Patient will identify two positive emotions or experiences to reflect on in order to balance out negative emotions:  2. Patient will label two or more emotions that they find the most difficult to experience:  3. Patient will be able to demonstrate positive conflict resolution skills through discussion or role plays:   Summary of Patient Progress:   Patient was active and engaged throughout group therapy today. Patient was able to identify two positive emotions that she reflects on in order to balance out negative emotions which are excitement and joy. Patient stated that crossfit helps her enhance her positive emotions. Patient was able to label two emotions that she finds the most difficult to experience which is anger and anxiety. Patient discussed the "bittersweet" emotions. She stated that sometimes she can have a negative and positive emotion in one situation. Patient was able to discuss positive conflict resolution skills that she uses to balance out her emotions and regulate them which is processing out her emotions with others and even herself.     Therapeutic Modalities:   Cognitive Behavioral Therapy Feelings  Identification Dialectical Behavioral Therapy   Ardelle Anton, LCSW

## 2019-09-28 NOTE — Progress Notes (Signed)
Recreation Therapy Notes  Date: 10.26.20 Time: 1006 Location: [address] Dayroom  Group Topic: Wellness  Goal Area(s) Addresses:  Patient will define components of whole wellness. Patient will verbalize benefit of whole wellness.  Behavioral Response: Engaged  Intervention: Music  Activity:  Exercise.  LRT led group in a series of stretches.  Each member the group got the chance to lead an exercise of their choice.  The group was to complete 3 rounds of exercise or 30 minutes of exercise.  Patients were instructed to take breaks and drink water as needed.  Education: Wellness, Dentist.   Education Outcome: Acknowledges education/In group clarification offered/Needs additional education.   Clinical Observations/Feedback:  Pt was appropriate and engaged throughout activity.  Pt was able to complete exercises.  Pt was social and smiling with peers.   Victorino Sparrow, LRT/CTRS         Ria Comment, Marcel Sorter A 09/28/2019 11:00 AM

## 2019-09-28 NOTE — Progress Notes (Signed)
D: Pt up to the nursing station asking multiple questions. Pt asked how her birth control worked and what she needed to do about not taking it.   A: Pt was offered support and encouragement. Pt was given scheduled medications. Pt was encourage to attend groups. Q 15 minute checks were done for safety.   R: safety maintained on unit.

## 2019-09-29 DIAGNOSIS — F250 Schizoaffective disorder, bipolar type: Secondary | ICD-10-CM | POA: Diagnosis not present

## 2019-09-29 NOTE — Progress Notes (Signed)
D: Pt visible on the unit this evening ,continues to be intrusive, but was not controversial this evening  A: Pt was offered support and encouragement. Pt was given scheduled medications. Pt was encourage to attend groups. Q 15 minute checks were done for safety.  R:Pt attends groups and interacts  with peers and staff. Pt is taking medication. Pt has no complaints.Pt receptive to treatment and safety maintained on unit.

## 2019-09-29 NOTE — Progress Notes (Signed)
Adult Psychoeducational Group Note  Date:  09/29/2019 Time:  3:01 AM  Group Topic/Focus:  Wrap-Up Group:   The focus of this group is to help patients review their daily goal of treatment and discuss progress on daily workbooks.  Participation Level:  Active  Participation Quality:  Appropriate  Affect:  Appropriate  Cognitive:  Appropriate  Insight: Appropriate  Engagement in Group:  Developing/Improving  Modes of Intervention:  Discussion  Additional Comments: Pt stated her goal for today was to talk with her doctor about her discharge plan. Pt stated she talk with her doctor but they did not discuss discharge. Pt stated her relationship with her family and support system has improved since she was admitted here. Pt stated contacting her mother and a family friend help improve her day.  Pt stated she felt better about herself today. Pt rated her over all day a 5 out of 10. Pt stated her appetite was pretty good today. Pt stated her goal for tonight was to get some sleep. Pt stated she was in no pain this evening. Pt stated she was not hearing or seeing anything that was not there. Pt stated she had no thoughts of harming herself or others. Pt stated if anything change she would alert staff.   Candy Sledge 09/29/2019, 3:01 AM

## 2019-09-29 NOTE — Progress Notes (Signed)
[facility] MD Progress Note  09/29/2019 9:11 AM Lisa [name]  MRN:  [id number] Subjective:    Patient continues to exhibit signs of hypomania this AM, although improved from prior. Patient described her mood as "good" this morning, stating that she had a nice time exercising outside yesterday evening, and that her sleep last night was "nice". She remains circumstantial and discharged focused when interviewed this AM, with mild fixation on learning the results of her recent thyroid studies. When asked about her goals for today, the patient stated that her goal today is to take an Melburn Popper to her parents house, pick up her car, grab some groceries, and go home.   Patient was otherwise agreeable and cooperative this AM, has been agreeable to taking her medications as prescribed, and denies and SI/HI/AVH.  After rounds noted walking briskly down hall screaming "Who F----d-up my family!!"   Principal Problem: Schizoaffective bipolar type acute exacerbation Diagnosis: Active Problems:   Schizoaffective disorder, bipolar type ([place])  Total Time spent with patient: 30 minutes  Past Psychiatric History: Extensive as discussed her first break was likely triggered by synthetic cannabis usage and she has been generally treatment resistant.  She will also need additional/new follow-up upon discharge  Past Medical History:  Past Medical History:  Diagnosis Date  . Anxiety     Past Surgical History:  Procedure Laterality Date  . extraction of wisdom teeth     Family History:  Family History  Problem Relation Age of Onset  . Schizophrenia Mother   . Depression Father   . Seizures Sister    Family Psychiatric  History: no new data Social History:  Social History   Substance and Sexual Activity  Alcohol Use Yes   Comment: casually     Social History   Substance and Sexual Activity  Drug Use No  . Types: Marijuana   Comment: THC last used 2 yrs ago, unsure of amount    Social History    Socioeconomic History  . Marital status: Single    Spouse name: Not on file  . Number of children: Not on file  . Years of education: Not on file  . Highest education level: Not on file  Occupational History  . Not on file  Social Needs  . Financial resource strain: Not on file  . Food insecurity    Worry: Not on file    Inability: Not on file  . Transportation needs    Medical: Not on file    Non-medical: Not on file  Tobacco Use  . Smoking status: Former Smoker    Packs/day: 0.25    Years: 0.50    Pack years: 0.12    Types: Cigarettes    Quit date: 09/03/2015    Years since quitting: 4.0  . Smokeless tobacco: Never Used  Substance and Sexual Activity  . Alcohol use: Yes    Comment: casually  . Drug use: No    Types: Marijuana    Comment: THC last used 2 yrs ago, unsure of amount  . Sexual activity: Not Currently    Comment: Plan B Pill  Lifestyle  . Physical activity    Days per week: Not on file    Minutes per session: Not on file  . Stress: Not on file  Relationships  . Social Herbalist on phone: Not on file    Gets together: Not on file    Attends religious service: Not on file    Active member  of club or organization: Not on file    Attends meetings of clubs or organizations: Not on file    Relationship status: Not on file  Other Topics Concern  . Not on file  Social History Narrative  . Not on file   Additional Social History:    Pain Medications: see MAR Prescriptions: see MAR Over the Counter: see MAR History of alcohol / drug use?: No history of alcohol / drug abuse                    Sleep: Fair  Appetite:  Fair  Current Medications: Current Facility-Administered Medications  Medication Dose Route Frequency Provider Last Rate Last Dose  . carvedilol (COREG) tablet 12.5 mg  12.5 mg Oral BID WC Malvin Johns, MD   12.5 mg at 09/28/19 2109  . clonazePAM (KLONOPIN) tablet 2 mg  2 mg Oral Once Malvin Johns, MD      .  haloperidol lactate (HALDOL) injection 10 mg  10 mg Intramuscular Q6H PRN Malvin Johns, MD   10 mg at 09/20/19 0243  . haloperidol lactate (HALDOL) injection 10 mg  10 mg Intramuscular BID PRN Malvin Johns, MD      . hydrOXYzine (ATARAX/VISTARIL) tablet 50 mg  50 mg Oral QHS Malvin Johns, MD   50 mg at 09/28/19 2108  . lithium carbonate (LITHOBID) CR tablet 600 mg  600 mg Oral Q12H Malvin Johns, MD   600 mg at 09/28/19 2108  . LORazepam (ATIVAN) tablet 2 mg  2 mg Oral Q4H PRN Malvin Johns, MD   2 mg at 09/24/19 2245   Or  . LORazepam (ATIVAN) injection 2 mg  2 mg Intramuscular Q4H PRN Malvin Johns, MD   2 mg at 09/17/19 1339  . LORazepam (ATIVAN) injection 4 mg  4 mg Intramuscular Once Malvin Johns, MD      . neomycin-bacitracin-polymyxin (NEOSPORIN) ointment   Topical PRN Jearld Lesch, NP      . nicotine polacrilex (NICORETTE) gum 2 mg  2 mg Oral PRN Jearld Lesch, NP   2 mg at 09/28/19 1308  . Oxcarbazepine (TRILEPTAL) tablet 300 mg  300 mg Oral BID Antonieta Pert, MD   300 mg at 09/28/19 2108  . paliperidone (INVEGA SUSTENNA) injection 156 mg  156 mg Intramuscular Once Malvin Johns, MD      . perphenazine (TRILAFON) tablet 16 mg  16 mg Oral BID Antonieta Pert, MD   16 mg at 09/28/19 2108  . temazepam (RESTORIL) capsule 45 mg  45 mg Oral QHS PRN Antonieta Pert, MD        Lab Results:  Results for orders placed or performed during the hospital encounter of 09/14/19 (from the past 48 hour(s))  Comprehensive metabolic panel     Status: Abnormal   Collection Time: 09/27/19  6:26 PM  Result Value Ref Range   Sodium 139 135 - 145 mmol/L   Potassium 4.3 3.5 - 5.1 mmol/L   Chloride 107 98 - 111 mmol/L   CO2 24 22 - 32 mmol/L   Glucose, Bld 69 (L) 70 - 99 mg/dL   BUN 10 6 - 20 mg/dL   Creatinine, Ser 1.61 0.44 - 1.00 mg/dL   Calcium 8.8 (L) 8.9 - 10.3 mg/dL   Total Protein 6.9 6.5 - 8.1 g/dL   Albumin 3.8 3.5 - 5.0 g/dL   AST 35 15 - 41 U/L   ALT 32 0 - 44 U/L   Alkaline  Phosphatase 59 38 - 126 U/L   Total Bilirubin 0.6 0.3 - 1.2 mg/dL   GFR calc non Af Amer >60 >60 mL/min   GFR calc Af Amer >60 >60 mL/min   Anion gap 8 5 - 15    Comment: Performed at [facility], [address] [place], [place] [id number]    Blood Alcohol level:  Lab Results  Component Value Date   [facility]   ETH <10 08/31/2019    Metabolic Disorder Labs: Lab Results  Component Value Date   HGBA1C 5.3 09/15/2019   MPG 105.41 09/15/2019   MPG 103 05/14/2015   Lab Results  Component Value Date   PROLACTIN 16.5 09/15/2019   PROLACTIN 18.6 06/10/2015   Lab Results  Component Value Date   CHOL 210 (H) 09/15/2019   TRIG 97 09/15/2019   HDL 60 09/15/2019   CHOLHDL 3.5 09/15/2019   VLDL 19 09/15/2019   LDLCALC 131 (H) 09/15/2019   LDLCALC 77 05/14/2015    Physical Findings: AIMS: Facial and Oral Movements Muscles of Facial Expression: None, normal Lips and Perioral Area: None, normal Jaw: None, normal Tongue: None, normal,Extremity Movements Upper (arms, wrists, hands, fingers): None, normal Lower (legs, knees, ankles, toes): None, normal, Trunk Movements Neck, shoulders, hips: None, normal, Overall Severity Severity of abnormal movements (highest score from questions above): None, normal Incapacitation due to abnormal movements: None, normal Patient's awareness of abnormal movements (rate only patient's report): No Awareness, Dental Status Current problems with teeth and/or dentures?: No Does patient usually wear dentures?: No  CIWA:    COWS:     Musculoskeletal: Strength & Muscle Tone: within normal limits Gait & Station: normal Patient leans: N/A  Psychiatric Specialty Exam: Physical Exam  ROS  Blood pressure 119/74, pulse 87, temperature 98 F (36.7 C), temperature source Oral, resp. rate 18, SpO2 98 %.There is no height or weight on file to calculate BMI.  General Appearance: Casual  Eye Contact:  Good  Speech:  Clear and  Coherent and Normal Rate  Volume:  Increased  Mood:  Generally hypomanic but redirectable  Affect:  Congruent  Thought Process:  Goal Directed and Descriptions of Associations: Circumstantial  Orientation:  Full (Time, Place, and Person)  Thought Content:  Rumination and occasional jumping from topic to topic, occasional irrelevant statements and material  Suicidal Thoughts:  No  Homicidal Thoughts:  No  Memory:  Immediate;   Fair Recent;   Fair Remote;   Good  Judgement:  Poor  Insight:  Shallow  Psychomotor Activity:  Normal  Concentration:  Concentration: Fair and Attention Span: Fair  Recall:  [employer]Fair  Fund of Knowledge:  Fair  Language:  Fair  Akathisia:  Negative  Handed:  Right  AIMS (if indicated):     Assets:  Health and safety inspectorinancial Resources/Insurance Housing Physical Health Resilience Social Support  ADL's:  Intact  Cognition:  WNL  Sleep:  Number of Hours: 2.25     Treatment Plan Summary: Daily contact with patient to assess and evaluate symptoms and progress in treatment and Medication management   For bipolar mania/psychosis in the context of an exacerbation of schizoaffective-continue PO Trilafon, lithium, and Trileptal off label. The patient is due for the follow up 156 mg dose of long-acting injectable paliperidone, which is ordered but not documented as administered upon chart review. Plan is to evaluate patient's willingness to receive long-acting injectable and consider forced med consult if deemed necessary  Continue reality based therapy  Continue to keep father  informed with her permission  Continue current 15-minute precautions  Reather Converse, Medical Student 09/29/2019, 9:11 AM

## 2019-09-29 NOTE — Progress Notes (Signed)
Pt attended spiritual care group   Spirituality group facilitated by Simone Curia, MDiv, BCC.  Group Description:  Group focused on topic of hope.  Patients participated in facilitated discussion around topic, connecting with one another around experiences and definitions for hope.  Group members engaged with visual explorer photos, reflecting on what hope looks like for them today.  Group engaged in discussion around how their definitions of hope are present today in hospital.   Modalities: Psycho-social ed, Adlerian, Narrative, MI Patient Progress: Lisa [name] continues to be in and out of group room, though appeared more oriented to topic and other group members than in prior groups during this admission.  She continues to engage tangentially, though she is loosely connected to other's comments.  For example, a group member was speaking about their grandmother as a source of hope, and Lisa [name] interjected that her grandmother had died of a stroke.  She then began to speak about her [place].  In relation to group topic, she was able to relate that her family uses the phrase "keep going" and stated that her family had endured a lot immigrating from [place].  She states this family motto helps her "keep going"

## 2019-09-29 NOTE — Progress Notes (Signed)
Pt been to the nursing station throughout the night asking questions to engage conversation. " I had my Thyroid checked a few days ago I want to know what my levels were" pt informed writer would discuss that information at 6 am, pt informed that if it was taken a few days ago , why was she asking about it now, and the answer could wait until 6 am.

## 2019-09-30 DIAGNOSIS — F250 Schizoaffective disorder, bipolar type: Secondary | ICD-10-CM | POA: Diagnosis not present

## 2019-09-30 MED ORDER — TEMAZEPAM 30 MG PO CAPS
30.0000 mg | ORAL_CAPSULE | Freq: Every evening | ORAL | Status: DC | PRN
  Administered 2019-09-30 – 2019-10-02 (×3): 30 mg via ORAL
  Filled 2019-09-30 (×3): qty 1

## 2019-09-30 NOTE — Progress Notes (Signed)
Adult Psychoeducational Group Note  Date:  09/30/2019 Time:  9:18 PM  Group Topic/Focus:  Wrap-Up Group:   The focus of this group is to help patients review their daily goal of treatment and discuss progress on daily workbooks.  Participation Level:  Minimal  Participation Quality:  Appropriate  Affect:  Flat  Cognitive:  Appropriate  Insight: Appropriate  Engagement in Group:  Engaged  Modes of Intervention:  Education and Support  Additional Comments:  Patient attended and participated in group tonight. She reports that her day was great. Today she felt unwell and she did spoke with her doctor abut it. She thinks the new medication he has her on is causing her to feel tired. Today she went to group and made a new friend.  Salley Scarlet [facility] 09/30/2019, 9:18 PM

## 2019-09-30 NOTE — Progress Notes (Signed)
Recreation Therapy Notes  Date: 10.28.20 Time: 1000 Location: 500 Hall Dayroom  Group Topic: Coping Skills  Goal Area(s) Addresses:  Patient will identify positive coping skills. Patient will identify benefits of using coping skills.  Behavioral Response: Engaged  Intervention:  Worksheet, pencils  Activity:  Healthy vs Unhealthy Coping Strategies.  Patients were to identify a current problem they are facing.  Patients were to then identify unhealthy coping strategies used and the consequences.  Patients then identified healthy coping strategies, expected outcomes and barriers to using healthy coping strategies.    Education: Radiographer, therapeutic, Dentist.   Education Outcome: Acknowledges understanding/In group clarification offered/Needs additional education.   Clinical Observations/Feedback: Pt was active and engaged.  Pt identified a current problem as "feeling stuck in the hospital without understanding why".  Pt identified unhealthy coping strategies as yelling and being aggressive.  Pt expressed the outcomes of these strategies as getting yelled back at and possibly getting restrained.  Pt healthy coping strategies were singing, talk to others and communication with family.  Expected outcomes were calm down, feel connected and plan discharge/provide comfort.  Pt identified barriers as told to be quiet, meeting someone she can't trust and the phones not working.    Victorino Sparrow, LRT/CTRS    Victorino Sparrow A 09/30/2019 12:30 PM

## 2019-09-30 NOTE — Progress Notes (Signed)
D: Pt visible on the unit this evening. Pt less intrusive and polite earlier this evening.  A: Pt was offered support and encouragement. Pt was given scheduled medications. Pt was encourage to attend groups. Q 15 minute checks were done for safety.  R:Pt attends groups and interacts well with peers and staff. Pt is taking medication. Pt has no complaints.Pt receptive to treatment and safety maintained on unit.

## 2019-09-30 NOTE — Progress Notes (Signed)
Adult Psychoeducational Group Note  Date:  09/30/2019 Time:  12:27 AM  Group Topic/Focus:  Wrap-Up Group:   The focus of this group is to help patients review their daily goal of treatment and discuss progress on daily workbooks.  Participation Level:  Active  Participation Quality:  Appropriate  Affect:  Appropriate  Cognitive:  Appropriate  Insight: Appropriate  Engagement in Group:  Developing/Improving  Modes of Intervention:  Discussion  Additional Comments: Pt stated her goal for todaywas to talk with her doctor about her dischargeplan. Pt stated she was able to talk with her doctor today but they did not discuss her discharge plan.Pt stated her relationship with her family has improved since she was admitted here. Pt stated been able to contact her parent and sister help improved her day. Pt stated she felt about the same about herself today. Pt rated her overall day a7out of 10. Pt stated her appetite was pretty goodtoday. Pt stated her goal for tonight is to get somesleep. Pt did not complain of any pain tonight. Pt stated she was not hearing voices or seeing anything that was not there. Pt stated she had no thoughts of harming herself or others. Pt stated she would alert staff if anything changes   Candy Sledge 09/30/2019, 12:27 AM

## 2019-09-30 NOTE — Progress Notes (Signed)
[facility] MD Progress Note  09/30/2019 9:29 AM Lisa [name]  MRN:  [id number] Subjective:    Patient continues to show resolution of manic symptoms this AM. She describes her mood as "average" this morning, was calm and cooperative throughout the interview, has minimally pressured speech, with linear and goal oriented thought process. She states that she had a "bad night last night" due to grogginess/confusion, which she believes was a side effect of Restoril. She states that she later received Ativan which "helped" clear her head and calm her down. She denies any complaints other than occasional dry mouth from the Vistaril.  Patient remains discharged focused today, with her stated goal being to have a conversation with her parents and the medical team regarding her discharge plan. Patient was otherwise agreeable and cooperative this AM, has been agreeable to taking her medications as prescribed, and denies and SI/HI/AVH.   Principal Problem: Schizoaffective bipolar type acute exacerbation Diagnosis: Active Problems:   Schizoaffective disorder, bipolar type ([place])  Total Time spent with patient: 30 minutes  Past Psychiatric History: Extensive as discussed her first break was likely triggered by synthetic cannabis usage and she has been generally treatment resistant.  She will also need additional/new follow-up upon discharge  Past Medical History:  Past Medical History:  Diagnosis Date  . Anxiety     Past Surgical History:  Procedure Laterality Date  . extraction of wisdom teeth     Family History:  Family History  Problem Relation Age of Onset  . Schizophrenia Mother   . Depression Father   . Seizures Sister    Family Psychiatric  History: no new data Social History:  Social History   Substance and Sexual Activity  Alcohol Use Yes   Comment: casually     Social History   Substance and Sexual Activity  Drug Use No  . Types: Marijuana   Comment: THC last used 2 yrs ago,  unsure of amount    Social History   Socioeconomic History  . Marital status: Single    Spouse name: Not on file  . Number of children: Not on file  . Years of education: Not on file  . Highest education level: Not on file  Occupational History  . Not on file  Social Needs  . Financial resource strain: Not on file  . Food insecurity    Worry: Not on file    Inability: Not on file  . Transportation needs    Medical: Not on file    Non-medical: Not on file  Tobacco Use  . Smoking status: Former Smoker    Packs/day: 0.25    Years: 0.50    Pack years: 0.12    Types: Cigarettes    Quit date: 09/03/2015    Years since quitting: 4.0  . Smokeless tobacco: Never Used  Substance and Sexual Activity  . Alcohol use: Yes    Comment: casually  . Drug use: No    Types: Marijuana    Comment: THC last used 2 yrs ago, unsure of amount  . Sexual activity: Not Currently    Comment: Plan B Pill  Lifestyle  . Physical activity    Days per week: Not on file    Minutes per session: Not on file  . Stress: Not on file  Relationships  . Social Herbalist on phone: Not on file    Gets together: Not on file    Attends religious service: Not on file  Active member of club or organization: Not on file    Attends meetings of clubs or organizations: Not on file    Relationship status: Not on file  Other Topics Concern  . Not on file  Social History Narrative  . Not on file   Additional Social History:    Pain Medications: see MAR Prescriptions: see MAR Over the Counter: see MAR History of alcohol / drug use?: No history of alcohol / drug abuse                    Sleep: Fair  Appetite:  Fair  Current Medications: Current Facility-Administered Medications  Medication Dose Route Frequency Provider Last Rate Last Dose  . carvedilol (COREG) tablet 12.5 mg  12.5 mg Oral BID WC Malvin Johns, MD   12.5 mg at 09/29/19 1739  . clonazePAM (KLONOPIN) tablet 2 mg  2 mg  Oral Once Malvin Johns, MD      . haloperidol lactate (HALDOL) injection 10 mg  10 mg Intramuscular Q6H PRN Malvin Johns, MD   10 mg at 09/20/19 0243  . haloperidol lactate (HALDOL) injection 10 mg  10 mg Intramuscular BID PRN Malvin Johns, MD      . hydrOXYzine (ATARAX/VISTARIL) tablet 50 mg  50 mg Oral QHS Malvin Johns, MD   50 mg at 09/29/19 2051  . lithium carbonate (LITHOBID) CR tablet 600 mg  600 mg Oral Q12H Malvin Johns, MD   600 mg at 09/29/19 2051  . LORazepam (ATIVAN) tablet 2 mg  2 mg Oral Q4H PRN Malvin Johns, MD   2 mg at 09/30/19 0242   Or  . LORazepam (ATIVAN) injection 2 mg  2 mg Intramuscular Q4H PRN Malvin Johns, MD   2 mg at 09/17/19 1339  . LORazepam (ATIVAN) injection 4 mg  4 mg Intramuscular Once Malvin Johns, MD      . neomycin-bacitracin-polymyxin (NEOSPORIN) ointment   Topical PRN Jearld Lesch, NP      . nicotine polacrilex (NICORETTE) gum 2 mg  2 mg Oral PRN Jearld Lesch, NP   2 mg at 09/28/19 1308  . Oxcarbazepine (TRILEPTAL) tablet 300 mg  300 mg Oral BID Antonieta Pert, MD   300 mg at 09/29/19 1739  . perphenazine (TRILAFON) tablet 16 mg  16 mg Oral BID Antonieta Pert, MD   16 mg at 09/29/19 1740  . temazepam (RESTORIL) capsule 30 mg  30 mg Oral QHS PRN Malvin Johns, MD        Lab Results:  No results found for this or any previous visit (from the past 48 hour(s)).  Blood Alcohol level:  Lab Results  Component Value Date   ETH <10 09/15/2019   ETH <10 08/31/2019    Metabolic Disorder Labs: Lab Results  Component Value Date   HGBA1C 5.3 09/15/2019   MPG 105.41 09/15/2019   MPG 103 05/14/2015   Lab Results  Component Value Date   PROLACTIN 16.5 09/15/2019   PROLACTIN 18.6 06/10/2015   Lab Results  Component Value Date   CHOL 210 (H) 09/15/2019   TRIG 97 09/15/2019   HDL 60 09/15/2019   CHOLHDL 3.5 09/15/2019   VLDL 19 09/15/2019   LDLCALC 131 (H) 09/15/2019   LDLCALC 77 05/14/2015    Physical Findings: AIMS: Facial and Oral  Movements Muscles of Facial Expression: None, normal Lips and Perioral Area: None, normal Jaw: None, normal Tongue: None, normal,Extremity Movements Upper (arms, wrists, hands, fingers): None, normal Lower (legs,  knees, ankles, toes): None, normal, Trunk Movements Neck, shoulders, hips: None, normal, Overall Severity Severity of abnormal movements (highest score from questions above): None, normal Incapacitation due to abnormal movements: None, normal Patient's awareness of abnormal movements (rate only patient's report): No Awareness, Dental Status Current problems with teeth and/or dentures?: No Does patient usually wear dentures?: No  CIWA:    COWS:     Musculoskeletal: Strength & Muscle Tone: within normal limits Gait & Station: normal Patient leans: N/A  Psychiatric Specialty Exam: Physical Exam  ROS  Blood pressure 113/73, pulse 95, temperature (!) 97.5 F (36.4 C), temperature source Oral, resp. rate 18, SpO2 98 %.There is no height or weight on file to calculate BMI.  General Appearance: Casual  Eye Contact:  Good  Speech:  Clear and Coherent and Normal Rate  Volume:  Normal  Mood:  Generally hypomanic but redirectable  Affect:  Congruent  Thought Process:  Goal Directed  Orientation:  Full (Time, Place, and Person)  Thought Content:  Logical and occasional jumping from topic to topic  Suicidal Thoughts:  No  Homicidal Thoughts:  No  Memory:  Immediate;   Fair Recent;   Fair Remote;   Good  Judgement:  Poor  Insight:  Shallow  Psychomotor Activity:  Normal  Concentration:  Concentration: Fair and Attention Span: Fair  Recall:  F[employer]Fair  Fund of Knowledge:  Fair  Language:  Fair  Akathisia:  Negative  Handed:  Right  AIMS (if indicated):     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 The patient received her follow up 156 mg dose of long-acting injectable paliperidone yesterday on 10/27.  Continue reality based therapy  Continue to keep father informed with her permission, reach out to mother as per patient request  Continue current 15-minute precautions  Reather ConverseJoshua A Ewy, Medical Student 09/30/2019, 9:29 AM

## 2019-09-30 NOTE — Tx Team (Signed)
Interdisciplinary Treatment and Diagnostic Plan Update  09/30/2019 Time of Session: 9:30am Lisa [name] MRN: [id number]  Principal Diagnosis: <principal problem not specified>  Secondary Diagnoses: Active Problems:   Schizoaffective disorder, bipolar type (HCC)   Current Medications:  Current Facility-Administered Medications  Medication Dose Route Frequency Provider Last Rate Last Dose  . carvedilol (COREG) tablet 12.5 mg  12.5 mg Oral BID WC [name], MD   12.5 mg at 09/30/19 0950  . clonazePAM (KLONOPIN) tablet 2 mg  2 mg Oral Once [name], MD      . haloperidol lactate (HALDOL) injection 10 mg  10 mg Intramuscular Q6H PRN [name], MD   10 mg at 09/20/19 0243  . haloperidol lactate (HALDOL) injection 10 mg  10 mg Intramuscular BID PRN [name], MD      . hydrOXYzine (ATARAX/VISTARIL) tablet 50 mg  50 mg Oral QHS [name], MD   50 mg at 09/29/19 2051  . lithium carbonate (LITHOBID) CR tablet 600 mg  600 mg Oral Q12H [name], MD   600 mg at 09/30/19 0950  . LORazepam (ATIVAN) tablet 2 mg  2 mg Oral Q4H PRN [name], MD   2 mg at 09/30/19 0242   Or  . LORazepam (ATIVAN) injection 2 mg  2 mg Intramuscular Q4H PRN [name], MD   2 mg at 09/17/19 1339  . LORazepam (ATIVAN) injection 4 mg  4 mg Intramuscular Once [name], MD      . neomycin-bacitracin-polymyxin (NEOSPORIN) ointment   Topical PRN [name], NP      . nicotine polacrilex (NICORETTE) gum 2 mg  2 mg Oral PRN [name], NP   2 mg at 09/28/19 1308  . Oxcarbazepine (TRILEPTAL) tablet 300 mg  300 mg Oral BID Sharma Covert, MD   300 mg at 09/30/19 0950  . perphenazine (TRILAFON) tablet 16 mg  16 mg Oral BID Sharma Covert, MD   16 mg at 09/30/19 0950  . temazepam (RESTORIL) capsule 30 mg  30 mg Oral QHS PRN [name], MD       PTA Medications: Medications Prior to Admission  Medication Sig Dispense Refill Last Dose  . ARIPiprazole (ABILIFY) 10 MG tablet Take 10  mg by mouth daily.     Derrill Memo ON 10/05/2019] ARIPiprazole ER (ABILIFY MAINTENA) 400 MG SRER injection Inject 2 mLs (400 mg total) into the muscle every 28 (twenty-eight) days. DUE 11/2 1 each 11 09/04/2019  . benztropine (COGENTIN) 1 MG tablet Take 1 tablet (1 mg total) by mouth 2 (two) times daily. 60 tablet 2   . carvedilol (COREG) 12.5 MG tablet Take 1 tablet (12.5 mg total) by mouth 2 (two) times daily with a meal. 60 tablet 2   . levonorgestrel-ethinyl estradiol (SEASONALE) 0.15-0.03 MG tablet Take 1 tablet by mouth daily.     [name] lithium carbonate (ESKALITH) 450 MG CR tablet Take 1 tablet (450 mg total) by mouth every 12 (twelve) hours. 60 tablet 2   . LORazepam (ATIVAN) 2 MG tablet Take 2 mg by mouth daily.     [name] omega-3 acid ethyl esters (LOVAZA) 1 g capsule Take 1 capsule (1 g total) by mouth 2 (two) times daily. 60 capsule 11   . temazepam (RESTORIL) 30 MG capsule Take 1 capsule (30 mg total) by mouth at bedtime. 30 capsule 0   . risperiDONE (RISPERDAL) 4 MG tablet Take 2 tablets (8 mg total) by mouth at bedtime. (Patient not taking:  Reported on 09/15/2019) 60 tablet 2 Not Taking at Unknown time    Patient Stressors:    Patient Strengths:    Treatment Modalities: Medication Management, Group therapy, Case management,  1 to 1 session with clinician, Psychoeducation, Recreational therapy.   Physician Treatment Plan for Primary Diagnosis: <principal problem not specified> Long Term Goal(s): Improvement in symptoms so as ready for discharge Improvement in symptoms so as ready for discharge   Short Term Goals: Ability to verbalize feelings will improve Ability to disclose and discuss suicidal ideas Ability to demonstrate self-control will improve Ability to identify and develop effective coping behaviors will improve Ability to disclose and discuss suicidal ideas Ability to demonstrate self-control will improve Ability to identify and develop effective coping behaviors will  improve Ability to maintain clinical measurements within normal limits will improve  Medication Management: Evaluate patient's response, side effects, and tolerance of medication regimen.  Therapeutic Interventions: 1 to 1 sessions, Unit Group sessions and Medication administration.  Evaluation of Outcomes: Progressing  Physician Treatment Plan for Secondary Diagnosis: Active Problems:   Schizoaffective disorder, bipolar type (HCC)  Long Term Goal(s): Improvement in symptoms so as ready for discharge Improvement in symptoms so as ready for discharge   Short Term Goals: Ability to verbalize feelings will improve Ability to disclose and discuss suicidal ideas Ability to demonstrate self-control will improve Ability to identify and develop effective coping behaviors will improve Ability to disclose and discuss suicidal ideas Ability to demonstrate self-control will improve Ability to identify and develop effective coping behaviors will improve Ability to maintain clinical measurements within normal limits will improve     Medication Management: Evaluate patient's response, side effects, and tolerance of medication regimen.  Therapeutic Interventions: 1 to 1 sessions, Unit Group sessions and Medication administration.  Evaluation of Outcomes: Progressing   RN Treatment Plan for Primary Diagnosis: <principal problem not specified> Long Term Goal(s): Knowledge of disease and therapeutic regimen to maintain health will improve  Short Term Goals: Ability to participate in decision making will improve, Ability to verbalize feelings will improve, Ability to disclose and discuss suicidal ideas, Ability to identify and develop effective coping behaviors will improve and Compliance with prescribed medications will improve  Medication Management: RN will administer medications as ordered by provider, will assess and evaluate patient's response and provide education to patient for prescribed  medication. RN will report any adverse and/or side effects to prescribing provider.  Therapeutic Interventions: 1 on 1 counseling sessions, Psychoeducation, Medication administration, Evaluate responses to treatment, Monitor vital signs and CBGs as ordered, Perform/monitor CIWA, COWS, AIMS and Fall Risk screenings as ordered, Perform wound care treatments as ordered.  Evaluation of Outcomes: Progressing   LCSW Treatment Plan for Primary Diagnosis: <principal problem not specified> Long Term Goal(s): Safe transition to appropriate next level of care at discharge, Engage patient in therapeutic group addressing interpersonal concerns.  Short Term Goals: Engage patient in aftercare planning with referrals and resources and Increase skills for wellness and recovery  Therapeutic Interventions: Assess for all discharge needs, 1 to 1 time with Social worker, Explore available resources and support systems, Assess for adequacy in community support network, Educate family and significant other(s) on suicide prevention, Complete Psychosocial Assessment, Interpersonal group therapy.  Evaluation of Outcomes: Progressing   Progress in Treatment: Attending groups: Yes. Participating in groups: Yes. Taking medication as prescribed: Yes. Toleration medication: Yes. Family/Significant other contact made: Yes, individual(s) contacted:  pt's father Patient understands diagnosis: Yes. Discussing patient identified problems/goals with staff: Yes. Medical problems  stabilized or resolved: Yes. Denies suicidal/homicidal ideation: Yes. Issues/concerns per patient self-inventory: No. Other:   New problem(s) identified: No, Describe:  None  New Short Term/Long Term Goal(s): Medication stabilization, elimination of SI thoughts, and development of a comprehensive mental wellness plan.   Patient Goals:  "To go home"  Discharge Plan or Barriers: Patient will be discharging home [facility]   Reason for Continuation of Hospitalization: Mania Medication stabilization  Estimated Length of Stay: 1-2 days   Attendees: Patient:  09/30/2019   Physician: Dr. Malvin JohnsBrian Farah, MD 09/30/2019   Nursing: Marton Redwoodoni, RN 09/30/2019   RN Care Manager: 09/30/2019   Social Worker: Stephannie PetersJasmine Dietrick Barris, LCSW 09/30/2019   Recreational Therapist:  09/30/2019   Nurse Practitioner: Marciano SequinJanet Sykes, NP 09/30/2019  Other:  09/30/2019   Other: 09/30/2019      Scribe for Treatment Team: Delphia GratesJasmine M Cherika Jessie, LCSW 09/30/2019 9:57 AM

## 2019-09-30 NOTE — Progress Notes (Signed)
Patient ID: Lisa [name], female   DOB: 1991-02-02, 28 y.o.   MRN: [id number]  CSW received a voicemail from pt's father, Edd Arbour. Pt's father stated that he has been talking to the patient over the phone and he feels that she is regressing. Pt's father asked if the patient has received her Lorayne Bender shot and that he requests to talk to the doctor.   CSW informed the doctor during progression meeting.   CSW called pt's father back and left a voicemail that the patient did take her [place] shot, yesterday.

## 2019-10-01 DIAGNOSIS — F250 Schizoaffective disorder, bipolar type: Secondary | ICD-10-CM | POA: Diagnosis not present

## 2019-10-01 MED ORDER — ACETAMINOPHEN 325 MG PO TABS
650.0000 mg | ORAL_TABLET | Freq: Four times a day (QID) | ORAL | Status: DC | PRN
  Administered 2019-10-06: 650 mg via ORAL
  Filled 2019-10-01: qty 2

## 2019-10-01 NOTE — Progress Notes (Signed)
Recreation Therapy Notes  Date: 10.29.20 Time: 1000 Location: 500 Hall Dayroom  Group Topic: Leisure Education  Goal Area(s) Addresses:  Patient will identify positive leisure activities.  Patient will identify one positive benefit of participation in leisure activities.   Behavioral Response: Engaged  Intervention: Leisure Group Game  Activity: Pictionary.  Patients were split into 2 groups.  One person from the group would pick a word from the container and draw it on the board.  Their team has one minute to guess what the picture is.  If they guess correctly, they get the point.  If they don't guess it, the other team gets a chance to steal the point.  Education:  Leisure Education, Dentist  Education Outcome: Acknowledges education/In group clarification offered/Needs additional education  Clinical Observations/Feedback: Pt worked well with peers.  Pt was able to focus on activity.  Pt did leave a few times but returned.  Pt was appropriate throughout group.    Victorino Sparrow, LRT/CTRS     Victorino Sparrow A 10/01/2019 11:46 AM

## 2019-10-01 NOTE — [facility] Group Notes (Signed)
[place] LCSW Group Therapy Note   Date and Time: 10/01/2019 @ 1:30pm  Type of Group and Topic: Psychoeducational Group: Discharge Planning and Individual Wellness  Participation Level: [facility] PARTICIPATION LEVEL: Active    Description of Group: Discharge planning group reviews patient's anticipated discharge plans and assists patients to anticipate and address any barriers to wellness/recovery in the community. Suicide prevention education is reviewed with patients in group. Therapeutic Goals 1. Patients will state their anticipated discharge plan and mental health aftercare 2. Patients will identify potential barriers to wellness in the community setting 3. Patients will engage in problem solving, solution focused discussion of ways to anticipate and address barriers to wellness/recovery     Summary of Patient Progress:   Patient was active and engaged throughout group therapy. Before group, patient was visible upset but was able to process her feelings and came to group. Patient stated that she is hopeful that her new mental health aftercare will be better since it is with [facility]. Patient stated that her barriers is her family but that her friends are often there to help her.    Plan for Discharge/Comments:   Patient stated that she will discharge home to her own condo and will follow up with [facility].     Transportation Means:  Patient stated that she will find someone to come get her.   Supports:  Friends      Therapeutic Modalities: Teacher, early years/pre

## 2019-10-01 NOTE — Progress Notes (Signed)
[facility] MD Progress Note  10/01/2019 10:26 AM Lisa [name]  MRN:  [id number] Subjective:    Patient continues to be intrusive and demanding a good deal of attention she does not have suicidal thoughts plans or intent and she is still hypomanic she sings very loudly at intervals her mother wanted to talk to me and of course this is fine with the patient allowed me to talk to her mother who had concerns listed below.  No change in precautions. Principal Problem: Exacerbation of underlying bipolar condition Diagnosis: Active Problems:   Schizoaffective disorder, bipolar type (HCC)  Total Time spent with patient: 20 minutes  Past Psychiatric History: exstensive  Past Medical History:  Past Medical History:  Diagnosis Date  . Anxiety     Past Surgical History:  Procedure Laterality Date  . extraction of wisdom teeth     Family History:  Family History  Problem Relation Age of Onset  . Schizophrenia Mother   . Depression Father   . Seizures Sister    Family Psychiatric  History: see eval Social History:  Social History   Substance and Sexual Activity  Alcohol Use Yes   Comment: casually     Social History   Substance and Sexual Activity  Drug Use No  . Types: Marijuana   Comment: THC last used 2 yrs ago, unsure of amount    Social History   Socioeconomic History  . Marital status: Single    Spouse name: Not on file  . Number of children: Not on file  . Years of education: Not on file  . Highest education level: Not on file  Occupational History  . Not on file  Social Needs  . Financial resource strain: Not on file  . Food insecurity    Worry: Not on file    Inability: Not on file  . Transportation needs    Medical: Not on file    Non-medical: Not on file  Tobacco Use  . Smoking status: Former Smoker    Packs/day: 0.25    Years: 0.50    Pack years: 0.12    Types: Cigarettes    Quit date: 09/03/2015    Years since quitting: 4.0  . Smokeless tobacco: Never  Used  Substance and Sexual Activity  . Alcohol use: Yes    Comment: casually  . Drug use: No    Types: Marijuana    Comment: THC last used 2 yrs ago, unsure of amount  . Sexual activity: Not Currently    Comment: Plan B Pill  Lifestyle  . Physical activity    Days per week: Not on file    Minutes per session: Not on file  . Stress: Not on file  Relationships  . Social Musician on phone: Not on file    Gets together: Not on file    Attends religious service: Not on file    Active member of club or organization: Not on file    Attends meetings of clubs or organizations: Not on file    Relationship status: Not on file  Other Topics Concern  . Not on file  Social History Narrative  . Not on file   Additional Social History:    Pain Medications: see MAR Prescriptions: see MAR Over the Counter: see MAR History of alcohol / drug use?: No history of alcohol / drug abuse  Sleep: Good  Appetite:  Good  Current Medications: Current Facility-Administered Medications  Medication Dose Route Frequency Provider Last Rate Last Dose  . carvedilol (COREG) tablet 12.5 mg  12.5 mg Oral BID WC Malvin JohnsFarah, Reighlyn Elmes, MD   12.5 mg at 10/01/19 0858  . clonazePAM (KLONOPIN) tablet 2 mg  2 mg Oral Once Malvin JohnsFarah, Jakira Mcfadden, MD      . haloperidol lactate (HALDOL) injection 10 mg  10 mg Intramuscular Q6H PRN Malvin JohnsFarah, Shemekia Patane, MD   10 mg at 09/20/19 0243  . haloperidol lactate (HALDOL) injection 10 mg  10 mg Intramuscular BID PRN Malvin JohnsFarah, Dimitrius Steedman, MD      . hydrOXYzine (ATARAX/VISTARIL) tablet 50 mg  50 mg Oral QHS Malvin JohnsFarah, Erinne Gillentine, MD   50 mg at 09/30/19 2108  . lithium carbonate (LITHOBID) CR tablet 600 mg  600 mg Oral Q12H Malvin JohnsFarah, Nickolette Espinola, MD   600 mg at 10/01/19 0858  . LORazepam (ATIVAN) tablet 2 mg  2 mg Oral Q4H PRN Malvin JohnsFarah, Jakara Blatter, MD   2 mg at 09/30/19 0242   Or  . LORazepam (ATIVAN) injection 2 mg  2 mg Intramuscular Q4H PRN Malvin JohnsFarah, Ellington Greenslade, MD   2 mg at 09/17/19 1339  . LORazepam  (ATIVAN) injection 4 mg  4 mg Intramuscular Once Malvin JohnsFarah, Catcher Dehoyos, MD      . neomycin-bacitracin-polymyxin (NEOSPORIN) ointment   Topical PRN Jearld Leschixon, Rashaun M, NP      . nicotine polacrilex (NICORETTE) gum 2 mg  2 mg Oral PRN Jearld Leschixon, Rashaun M, NP   2 mg at 09/28/19 1308  . Oxcarbazepine (TRILEPTAL) tablet 300 mg  300 mg Oral BID Antonieta Pertlary, Greg Lawson, MD   300 mg at 10/01/19 0858  . perphenazine (TRILAFON) tablet 16 mg  16 mg Oral BID Antonieta Pertlary, Greg Lawson, MD   16 mg at 10/01/19 0858  . temazepam (RESTORIL) capsule 30 mg  30 mg Oral QHS PRN Malvin JohnsFarah, Fareed Fung, MD   30 mg at 09/30/19 2108    Lab Results: No results found for this or any previous visit (from the past 48 hour(s)).  Blood Alcohol level:  Lab Results  Component Value Date   ETH <10 09/15/2019   ETH <10 08/31/2019    Metabolic Disorder Labs: Lab Results  Component Value Date   HGBA1C 5.3 09/15/2019   MPG 105.41 09/15/2019   MPG 103 05/14/2015   Lab Results  Component Value Date   PROLACTIN 16.5 09/15/2019   PROLACTIN 18.6 06/10/2015   Lab Results  Component Value Date   CHOL 210 (H) 09/15/2019   TRIG 97 09/15/2019   HDL 60 09/15/2019   CHOLHDL 3.5 09/15/2019   VLDL 19 09/15/2019   LDLCALC 131 (H) 09/15/2019   LDLCALC 77 05/14/2015    Physical Findings: AIMS: Facial and Oral Movements Muscles of Facial Expression: None, normal Lips and Perioral Area: None, normal Jaw: None, normal Tongue: None, normal,Extremity Movements Upper (arms, wrists, hands, fingers): None, normal Lower (legs, knees, ankles, toes): None, normal, Trunk Movements Neck, shoulders, hips: None, normal, Overall Severity Severity of abnormal movements (highest score from questions above): None, normal Incapacitation due to abnormal movements: None, normal Patient's awareness of abnormal movements (rate only patient's report): No Awareness, Dental Status Current problems with teeth and/or dentures?: No Does patient usually wear dentures?: No  CIWA:     COWS:     Musculoskeletal: Strength & Muscle Tone: within normal limits Gait & Station: normal Patient leans: N/A  Psychiatric Specialty Exam: Physical Exam  ROS  Blood pressure 130/90, pulse [name](!)  105, temperature 97.9 F (36.6 C), temperature source Oral, resp. rate 18, SpO2 98 %.There is no height or weight on file to calculate BMI.  General Appearance: Casual  Eye Contact:  Good  Speech:  Pressured  Volume:  Increased  Mood:  hypomanic  Affect:  Labile  Thought Process:  Irrelevant and Descriptions of Associations: Tangential  Orientation:  Full (Time, Place, and Person)  Thought Content:  Tangential  Suicidal Thoughts:  No  Homicidal Thoughts:  No  Memory:  Immediate;   Fair Recent;   Fair Remote;   Fair  Judgement:  Fair  Insight:  Fair  Psychomotor Activity:  Normal  Concentration:  Concentration: Fair  Recall:  [employer] of Knowledge:  Fair  Language:  Fair  Akathisia:  Negative  Handed:  Right  AIMS (if indicated):     Assets:  Communication Skills Desire for Improvement  ADL's:  Intact  Cognition:  WNL  Sleep:  Number of Hours: 5.25     Treatment Plan Summary: Daily contact with patient to assess and evaluate symptoms and progress in treatment and Medication management  Continue current perphenazine dosing in combination with lithium and long-acting injectable paliperidone no change in precautions patient did allow me to speak with her mother and was rather insistent upon it her mother's goals are to have the patient live independently, stay in her own apartment at night, return to work, but of course have as needed medications if she "cannot sleep" or needs help  [name], MD 10/01/2019, 10:26 AM

## 2019-10-01 NOTE — Progress Notes (Signed)
   10/01/19 2200  Psych Admission Type (Psych Patients Only)  Admission Status Voluntary  Psychosocial Assessment  Patient Complaints Worrying  Eye Contact Brief  Facial Expression Anxious  Affect Anxious;Preoccupied  Speech Logical/coherent  Interaction Assertive  Motor Activity Fidgety;Pacing  Appearance/Hygiene Unremarkable  Behavior Characteristics Anxious  Mood Anxious  Thought Process  Coherency WDL  Content Preoccupation;Paranoia  Delusions None reported or observed  Perception WDL  Hallucination None reported or observed  Judgment Impaired  Confusion None  Danger to Self  Current suicidal ideation? Denies  Danger to Others  Danger to Others None reported or observed   D: Pt visible on the unit this evening. Pt concerned about her records from previous admissions. Pt encouraged to get copies upon D/C of the information she needed.  A: Pt was offered support and encouragement. Pt was given scheduled medications. Pt was encourage to attend groups. Q 15 minute checks were done for safety.  R:Pt attends groups and interacts well with peers and staff. Pt is taking medication. Pt has no complaints.Pt receptive to treatment and safety maintained on unit.

## 2019-10-01 NOTE — Progress Notes (Signed)
Adult Psychoeducational Group Note  Date:  10/01/2019 Time:  8:36 PM  Group Topic/Focus:  Wrap-Up Group:   The focus of this group is to help patients review their daily goal of treatment and discuss progress on daily workbooks.  Participation Level:  Active  Participation Quality:  Appropriate  Affect:  Appropriate  Cognitive:  Alert  Insight: Appropriate  Engagement in Group:  Engaged  Modes of Intervention:  Discussion  Additional Comments:  Pt rated her day 9/10. Pt stated that her goal for today is to work on discharge planning. Pt is hopeful to be discharged tomorrow.  Sharmon Revere 10/01/2019, 8:36 PM

## 2019-10-02 DIAGNOSIS — F250 Schizoaffective disorder, bipolar type: Secondary | ICD-10-CM | POA: Diagnosis not present

## 2019-10-02 MED ORDER — IBUPROFEN 600 MG PO TABS
600.0000 mg | ORAL_TABLET | Freq: Once | ORAL | Status: AC
  Administered 2019-10-02: 600 mg via ORAL
  Filled 2019-10-02 (×2): qty 1

## 2019-10-02 MED ORDER — MAGNESIUM HYDROXIDE 400 MG/5ML PO SUSP: 30.0000 mL | Freq: Every day | ORAL | Status: DC | PRN

## 2019-10-02 NOTE — Progress Notes (Signed)
Pt wants  Attention and when she does not get the attention she wants she gets angry and tries to become controversial. " what are the names of all the people on the hall, do you know them , tell me all their names"

## 2019-10-02 NOTE — Progress Notes (Signed)
Pt up to the nursing station asking unimportant  questions . " If I go part time at my job , what insurance would I have to get, would that be Obamacare ? "

## 2019-10-02 NOTE — Progress Notes (Signed)
Pt continues to be attention seeking and intrusive especially with other peoples care. Pt out to the nursing station asking about another patient. " He didn't get any medicine, what if he doesn't wake up, he just doesn't get any medicine? "  Pt informed we can not discuss other patients care with peers.

## 2019-10-02 NOTE — Plan of Care (Signed)
Anxious but pleasant in the milieu. Taking medications and participating in group activities.

## 2019-10-02 NOTE — Progress Notes (Signed)
   10/02/19 2100  Psych Admission Type (Psych Patients Only)  Admission Status Voluntary  Psychosocial Assessment  Patient Complaints Anxiety  Eye Contact Fair  Facial Expression Anxious  Affect Anxious;Preoccupied  Speech Logical/coherent  Interaction Assertive  Motor Activity Pacing  Appearance/Hygiene Unremarkable  Behavior Characteristics Anxious  Mood Anxious  Thought Process  Coherency WDL  Content Obsessions;Preoccupation;Paranoia  Delusions None reported or observed  Perception WDL  Hallucination None reported or observed  Judgment Impaired  Confusion None  Danger to Self  Current suicidal ideation? Denies  Danger to Others  Danger to Others None reported or observed   Pt visible on the unit, pt has pleasant affect and polite to staff this evening.

## 2019-10-02 NOTE — Progress Notes (Signed)
Recreation Therapy Notes  Date: 10.30.20 Time: 1000 Location:  [address] Dayroom  Group Topic: Communication, Team Building, Problem Solving  Goal Area(s) Addresses:  Patient will effectively work with peer towards shared goal.  Patient will identify skills used to make activity successful.  Patient will identify how skills used during activity can be used to reach post d/c goals.   Behavioral Response: Engaged  Intervention: STEM Activity  Activity: Straw Bridge.  Patients were divided into groups.  Patients were given 15 straws and a long piece of masking tape.  Patients were to build an elevated bridge that could hold a small puzzle box.  Education:Social Skills, Discharge Planning   Education Outcome: Acknowledges education/In group clarification offered/Needs additional education.   Clinical Observations/Feedback:  Pt worked well with peers.  Pt and peers attempted to complete the project but were unable to.  Pt gave up and was distracted by other things.    Victorino Sparrow, LRT/CTRS        Victorino Sparrow A 10/02/2019 12:02 PM

## 2019-10-02 NOTE — Progress Notes (Signed)
Adult Psychoeducational Group Note  Date:  10/02/2019 Time:  9:24 PM  Group Topic/Focus:  Wrap-Up Group:   The focus of this group is to help patients review their daily goal of treatment and discuss progress on daily workbooks.  Participation Level:  Active  Participation Quality:  Appropriate  Affect:  Appropriate  Cognitive:  Appropriate  Insight: Appropriate  Engagement in Group:  Engaged  Modes of Intervention:  Education and Support  Additional Comments:  Patient attended and participated in group tonight. She reports having overian pain but she did not disclosed this to the nurse. This Probation officer encourage her to talk to her nurse about it.  Salley Scarlet [facility] 10/02/2019, 9:24 PM

## 2019-10-02 NOTE — [facility] Group Notes (Addendum)
LCSW Group Therapy Note    Date/Time: 10/02/2019 @ 1:30pm  Mood: Pleasant  Type of Therapy and Topic: Group Therapy: Avoiding Self-Sabotaging and Enabling Behaviors  Participation Level: Active   Description of Group:  In this group, patients will learn how to identify obstacles, self-sabotaging and enabling behaviors, as well as: what are they, why do we do them and what needs these behaviors meet. Discuss unhealthy relationships and how to have positive healthy boundaries with those that sabotage and enable. Explore aspects of self-sabotage and enabling in yourself and how to limit these self-destructive behaviors in everyday life.   Therapeutic Goals: 1. Patient will identify one obstacle that relates to self-sabotage and enabling behaviors 2. Patient will identify one personal self-sabotaging or enabling behavior they did prior to admission 3. Patient will state a plan to change the above identified behavior 4. Patient will demonstrate ability to communicate their needs through discussion and/or role play.    Summary of Patient Progress:   Patient was active and engaged throughout group therapy today. Patient stated that she can identify two ways that she has self-sabotaging behaviors that interfere with her trying to do better in life which are: spending too much money (she doesn't prioritize) and overworking. Patient stated that her plan to change her self-sabotaging behavior is to try to talk it out with a therapist or friend.     Therapeutic Modalities:  Cognitive Behavioral Therapy Person-Centered Therapy Motivational Interviewing    Phenix, Leslie

## 2019-10-02 NOTE — Progress Notes (Signed)
[facility] MD Progress Note  10/02/2019 2:26 PM Lisa [name]  MRN:  [id number] Subjective:    Patient continues to insist that she is to be discharged each day however this is not in the treatment team plans.  She is now alert oriented and generally cooperative and trying very hard to contain her mood state.  She denies hallucinations or thoughts of self-harm.  She is more cordial overall- less volatile  Principal Problem:  Diagnosis: Active Problems:   Schizoaffective disorder, bipolar type (HCC)  Total Time spent with patient: 20 minutes  Past Psychiatric History: see eval  Past Medical History:  Past Medical History:  Diagnosis Date  . Anxiety     Past Surgical History:  Procedure Laterality Date  . extraction of wisdom teeth     Family History:  Family History  Problem Relation Age of Onset  . Schizophrenia Mother   . Depression Father   . Seizures Sister    Family Psychiatric  History: see eval Social History:  Social History   Substance and Sexual Activity  Alcohol Use Yes   Comment: casually     Social History   Substance and Sexual Activity  Drug Use No  . Types: Marijuana   Comment: THC last used 2 yrs ago, unsure of amount    Social History   Socioeconomic History  . Marital status: Single    Spouse name: Not on file  . Number of children: Not on file  . Years of education: Not on file  . Highest education level: Not on file  Occupational History  . Not on file  Social Needs  . Financial resource strain: Not on file  . Food insecurity    Worry: Not on file    Inability: Not on file  . Transportation needs    Medical: Not on file    Non-medical: Not on file  Tobacco Use  . Smoking status: Former Smoker    Packs/day: 0.25    Years: 0.50    Pack years: 0.12    Types: Cigarettes    Quit date: 09/03/2015    Years since quitting: 4.0  . Smokeless tobacco: Never Used  Substance and Sexual Activity  . Alcohol use: Yes    Comment: casually  .  Drug use: No    Types: Marijuana    Comment: THC last used 2 yrs ago, unsure of amount  . Sexual activity: Not Currently    Comment: Plan B Pill  Lifestyle  . Physical activity    Days per week: Not on file    Minutes per session: Not on file  . Stress: Not on file  Relationships  . Social Herbalist on phone: Not on file    Gets together: Not on file    Attends religious service: Not on file    Active member of club or organization: Not on file    Attends meetings of clubs or organizations: Not on file    Relationship status: Not on file  Other Topics Concern  . Not on file  Social History Narrative  . Not on file   Additional Social History:    Pain Medications: see MAR Prescriptions: see MAR Over the Counter: see MAR History of alcohol / drug use?: No history of alcohol / drug abuse                    Sleep: Fair  Appetite:  fair  Current Medications: Current Facility-Administered Medications  Medication Dose Route Frequency Provider Last Rate Last Dose  . acetaminophen (TYLENOL) tablet 650 mg  650 mg Oral Q6H PRN Anike, Adaku C, NP      . carvedilol (COREG) tablet 12.5 mg  12.5 mg Oral BID WC Malvin JohnsFarah, Laela Deviney, MD   12.5 mg at 10/02/19 0829  . clonazePAM (KLONOPIN) tablet 2 mg  2 mg Oral Once Malvin JohnsFarah, Sarayah Bacchi, MD      . haloperidol lactate (HALDOL) injection 10 mg  10 mg Intramuscular Q6H PRN Malvin JohnsFarah, Keaisha Sublette, MD   10 mg at 09/20/19 0243  . haloperidol lactate (HALDOL) injection 10 mg  10 mg Intramuscular BID PRN Malvin JohnsFarah, Chaney Maclaren, MD      . hydrOXYzine (ATARAX/VISTARIL) tablet 50 mg  50 mg Oral QHS Malvin JohnsFarah, Denise Bramblett, MD   50 mg at 10/01/19 2106  . lithium carbonate (LITHOBID) CR tablet 600 mg  600 mg Oral Q12H Malvin JohnsFarah, Dayleen Beske, MD   600 mg at 10/02/19 [id number]  . LORazepam (ATIVAN) tablet 2 mg  2 mg Oral Q4H PRN Malvin JohnsFarah, Crystel Demarco, MD   2 mg at 09/30/19 0242   Or  . LORazepam (ATIVAN) injection 2 mg  2 mg Intramuscular Q4H PRN Malvin JohnsFarah, Marthann Abshier, MD   2 mg at 09/17/19 1339  . LORazepam  (ATIVAN) injection 4 mg  4 mg Intramuscular Once Malvin JohnsFarah, Elsey Holts, MD      . neomycin-bacitracin-polymyxin (NEOSPORIN) ointment   Topical PRN Jearld Leschixon, Rashaun M, NP      . nicotine polacrilex (NICORETTE) gum 2 mg  2 mg Oral PRN Jearld Leschixon, Rashaun M, NP   2 mg at 09/28/19 1308  . Oxcarbazepine (TRILEPTAL) tablet 300 mg  300 mg Oral BID Antonieta Pertlary, Greg Lawson, MD   300 mg at 10/02/19 [id number]  . perphenazine (TRILAFON) tablet 16 mg  16 mg Oral BID Antonieta Pertlary, Greg Lawson, MD   16 mg at 10/02/19 [id number]  . temazepam (RESTORIL) capsule 30 mg  30 mg Oral QHS PRN Malvin JohnsFarah, Esli Jernigan, MD   30 mg at 10/01/19 2106    Lab Results: No results found for this or any previous visit (from the past 48 hour(s)).  Blood Alcohol level:  Lab Results  Component Value Date   ETH <10 09/15/2019   ETH <10 08/31/2019    Metabolic Disorder Labs: Lab Results  Component Value Date   HGBA1C 5.3 09/15/2019   MPG 105.41 09/15/2019   MPG 103 05/14/2015   Lab Results  Component Value Date   PROLACTIN 16.5 09/15/2019   PROLACTIN 18.6 06/10/2015   Lab Results  Component Value Date   CHOL 210 (H) 09/15/2019   TRIG 97 09/15/2019   HDL 60 09/15/2019   CHOLHDL 3.5 09/15/2019   VLDL 19 09/15/2019   LDLCALC 131 (H) 09/15/2019   LDLCALC 77 05/14/2015    Physical Findings: AIMS: Facial and Oral Movements Muscles of Facial Expression: None, normal Lips and Perioral Area: None, normal Jaw: None, normal Tongue: None, normal,Extremity Movements Upper (arms, wrists, hands, fingers): None, normal Lower (legs, knees, ankles, toes): None, normal, Trunk Movements Neck, shoulders, hips: None, normal, Overall Severity Severity of abnormal movements (highest score from questions above): None, normal Incapacitation due to abnormal movements: None, normal Patient's awareness of abnormal movements (rate only patient's report): No Awareness, Dental Status Current problems with teeth and/or dentures?: No Does patient usually wear dentures?: No  CIWA:     COWS:     Musculoskeletal: Strength & Muscle Tone: within normal limits Gait & Station: normalPsychiatric Specialty Exam: Physical Exam  ROS  Blood pressure  130/90, pulse (!) 105, temperature 97.9 F (36.6 C), temperature source Oral, resp. rate 18, SpO2 98 %.There is no height or weight on file to calculate BMI.  General Appearance: Casual  Eye Contact:  Good  Speech:  Clear and Coherent  Volume:  Normal  Mood:  Euthymic  Affect:  Restricted  Thought Process:  Irrelevant and Descriptions of Associations: Loose  Orientation:  Full (Time, Place, and Person)  Thought Content:  Tangential  Suicidal Thoughts:  No  Homicidal Thoughts:  No  Memory:  Immediate;   Fair Recent;   Fair Remote;   Good  Judgement:  Poor  Insight:  Lacking  Psychomotor Activity:  Normal  Concentration:  Concentration: Fair and Attention Span: Fair  Recall:  [employer] of Knowledge:  Fair  Language:  Good  Akathisia:  Negative  Handed:  Right  AIMS (if indicated):     Assets:  Physical Health Resilience  ADL's:  Intact  Cognition:  WNL  Sleep:  Number of Hours: 4    Treatment Plan Summary: Daily contact with patient to assess and evaluate symptoms and progress in treatment and Medication management  Family still has concerns that she is not baseline we will monitor through the weekend no change in precautions continue cognitive and reality-based therapies.  No change in meds standard warnings given   Malvin Johns, MD 10/02/2019, 2:26 PM

## 2019-10-03 DIAGNOSIS — F250 Schizoaffective disorder, bipolar type: Secondary | ICD-10-CM | POA: Diagnosis not present

## 2019-10-03 NOTE — [facility] Group Notes (Signed)
  [facility] LCSW Group Therapy Note  Date/Time:  10/03/2019 11:15AM-12:00PM  Type of Therapy and Topic:  Group Therapy:  Avoiding Rehospitalization  Participation Level:  None   Description of Group This process group involved patients discussing their plans related to taking care of themselves at discharge in order to avoid, if at all possible, being rehospitalized in the future.  The group brainstormed specific ways in which they will be able to implement these plans.    Therapeutic Goals 1. Patient will identify and describe 2-3 specific tasks they plan to undertake in order to maximize their wellness and avoid coming back to a hospital. 2. Patient will verbalize benefits of each task described, as well as barriers to implementation. 3. Patients will brainstorm ways to implement their plans.  Summary of Patient Participation:  Patient was present at the beginning of group but left.  Therapeutic Modalities Stages of Change theory Motivational Interviewing    Selmer Dominion, LCSW 10/03/2019, 8:53 AM

## 2019-10-03 NOTE — Plan of Care (Signed)
  Problem: Coping: Goal: Ability to verbalize frustrations and anger appropriately will improve Outcome: Progressing Goal: Ability to demonstrate self-control will improve Outcome: Progressing   Problem: Safety: Goal: Periods of time without injury will increase Outcome: Progressing    D: Pt alert and oriented on the unit. Pt engaging with RN staff and other pts, and participated during unit groups and activities. Pt is pleasant and cooperative. A: Education, support and encouragement provided, q15 minute safety checks remain in effect. Medications administered per MD orders. R: No reactions/side effects to medicine noted. Pt denies any concerns at this time, and verbally contracts for safety. Pt ambulating on the unit with no issues. Pt remains safe on and off the unit.

## 2019-10-03 NOTE — Progress Notes (Addendum)
[facility] MD Progress Note  10/03/2019 10:11 AM Lisa [name]  MRN:  [id number] Subjective:  "I'm fine."  Lisa [name] found sitting in the dayroom. Per chart review family had concerns yesterday that she was not yet at baseline and wanted her monitored through the weekend. Today she presents with euthymic affect and reports stable mood. She reports good sleep and appetite. She is calm and cooperative and denies complaints. Denies SI/AVH. Per chart review she continues to be intrusive with other patients' care on the unit at times but with no agitated behaviors on the unit. No signs of responding to internal stimuli. No paranoid or delusional thought content expressed. She is medication compliant and states she feels stable with current medication regimen.   From admission H&P: This is the sixth psychiatric admission for Ms. midge, momon she was recently here from 9/28 to 10/6-thus she is admitted exactly 1 week after her last discharge. Despite long-acting injectable aripiprazole administered on 10/2 at 400 mg IM, as well as lithium and Risperdal at 8 mg at bedtime, the patient presented on 10/12 with paranoia, some unusual ideas of reference, believing that her current psychiatrist had been "receiving messages from her,and sending them to her"  Principal Problem: <principal problem not specified> Diagnosis: Active Problems:   Schizoaffective disorder, bipolar type (HCC)  Total Time spent with patient: 15 minutes  Past Psychiatric History: See admission H&P  Past Medical History:  Past Medical History:  Diagnosis Date  . Anxiety     Past Surgical History:  Procedure Laterality Date  . extraction of wisdom teeth     Family History:  Family History  Problem Relation Age of Onset  . Schizophrenia Mother   . Depression Father   . Seizures Sister    Family Psychiatric  History: See admission H&P Social History:  Social History   Substance and Sexual Activity  Alcohol Use Yes   Comment:  casually     Social History   Substance and Sexual Activity  Drug Use No  . Types: Marijuana   Comment: THC last used 2 yrs ago, unsure of amount    Social History   Socioeconomic History  . Marital status: Single    Spouse name: Not on file  . Number of children: Not on file  . Years of education: Not on file  . Highest education level: Not on file  Occupational History  . Not on file  Social Needs  . Financial resource strain: Not on file  . Food insecurity    Worry: Not on file    Inability: Not on file  . Transportation needs    Medical: Not on file    Non-medical: Not on file  Tobacco Use  . Smoking status: Former Smoker    Packs/day: 0.25    Years: 0.50    Pack years: 0.12    Types: Cigarettes    Quit date: 09/03/2015    Years since quitting: 4.0  . Smokeless tobacco: Never Used  Substance and Sexual Activity  . Alcohol use: Yes    Comment: casually  . Drug use: No    Types: Marijuana    Comment: THC last used 2 yrs ago, unsure of amount  . Sexual activity: Not Currently    Comment: Plan B Pill  Lifestyle  . Physical activity    Days per week: Not on file    Minutes per session: Not on file  . Stress: Not on file  Relationships  . Social connections  Talks on phone: Not on file    Gets together: Not on file    Attends religious service: Not on file    Active member of club or organization: Not on file    Attends meetings of clubs or organizations: Not on file    Relationship status: Not on file  Other Topics Concern  . Not on file  Social History Narrative  . Not on file   Additional Social History:    Pain Medications: see MAR Prescriptions: see MAR Over the Counter: see MAR History of alcohol / drug use?: No history of alcohol / drug abuse                    Sleep: Fair  Appetite:  Good  Current Medications: Current Facility-Administered Medications  Medication Dose Route Frequency Provider Last Rate Last Dose  .  acetaminophen (TYLENOL) tablet 650 mg  650 mg Oral Q6H PRN Anike, Adaku C, NP      . carvedilol (COREG) tablet 12.5 mg  12.5 mg Oral BID WC Farah, Brian, MD   12.5 mg at 10/03/19 0829  . clonazePAM (KLONOPIN) tablet 2 mg  2 mg Oral Once Malvin JohnsFarah, Brian, MD      . haloperidol lactate (HALDOL) injection 10 mg  10 mg Intramuscular Q6H PRN Malvin JohnsMalvin JohnsFarah, Brian, MD   10 mg at 09/20/19 0243  . haloperidol lactate (HALDOL) injection 10 mg  10 mg Intramuscular BID PRN Malvin JohnsFarah, Brian, MD      . hydrOXYzine (ATARAX/VISTARIL) tablet 50 mg  50 mg Oral QHS Malvin JohnsFarah, Brian, MD   50 mg at 10/02/19 2113  . lithium carbonate (LITHOBID) CR tablet 600 mg  600 mg Oral Q12H Malvin JohnsFarah, Brian, MD   600 mg at 10/03/19 0829  . LORazepam (ATIVAN) tablet 2 mg  2 mg Oral Q4H PRN Malvin JohnsFarah, Brian, MD   2 mg at 09/30/19 0242   Or  . LORazepam (ATIVAN) injection 2 mg  2 mg Intramuscular Q4H PRN Malvin JohnsFarah, Brian, MD   2 mg at 09/17/19 1339  . LORazepam (ATIVAN) injection 4 mg  4 mg Intramuscular Once Malvin JohnsFarah, Brian, MD      . magnesium hydroxide (MILK OF MAGNESIA) suspension 30 mL  30 mL Oral Daily PRN Nira ConnBerry, Jason A, NP      . neomycin-bacitracin-polymyxin (NEOSPORIN) ointment   Topical PRN Jearld Leschixon, Rashaun M, NP      . nicotine polacrilex (NICORETTE) gum 2 mg  2 mg Oral PRN Jearld Leschixon, Rashaun M, NP   2 mg at 09/28/19 1308  . Oxcarbazepine (TRILEPTAL) tablet 300 mg  300 mg Oral BID Antonieta Pertlary, Greg Lawson, MD   300 mg at 10/03/19 [id number]  . perphenazine (TRILAFON) tablet 16 mg  16 mg Oral BID Antonieta Pertlary, Greg Lawson, MD   16 mg at 10/03/19 [id number]  . temazepam (RESTORIL) capsule 30 mg  30 mg Oral QHS PRN Malvin JohnsFarah, Brian, MD   30 mg at 10/02/19 2113    Lab Results: No results found for this or any previous visit (from the past 48 hour(s)).  Blood Alcohol level:  Lab Results  Component Value Date   [facility]   ETH <10 08/31/2019    Metabolic Disorder Labs: Lab Results  Component Value Date   HGBA1C 5.3 09/15/2019   MPG 105.41 09/15/2019   MPG 103 05/14/2015    Lab Results  Component Value Date   PROLACTIN 16.5 09/15/2019   PROLACTIN 18.6 06/10/2015   Lab Results  Component Value Date  CHOL 210 (H) 09/15/2019   TRIG 97 09/15/2019   HDL 60 09/15/2019   CHOLHDL 3.5 09/15/2019   VLDL 19 09/15/2019   LDLCALC 131 (H) 09/15/2019   LDLCALC 77 05/14/2015    Physical Findings: AIMS: Facial and Oral Movements Muscles of Facial Expression: None, normal Lips and Perioral Area: None, normal Jaw: None, normal Tongue: None, normal,Extremity Movements Upper (arms, wrists, hands, fingers): None, normal Lower (legs, knees, ankles, toes): None, normal, Trunk Movements Neck, shoulders, hips: None, normal, Overall Severity Severity of abnormal movements (highest score from questions above): None, normal Incapacitation due to abnormal movements: None, normal Patient's awareness of abnormal movements (rate only patient's report): No Awareness, Dental Status Current problems with teeth and/or dentures?: No Does patient usually wear dentures?: No  CIWA:    COWS:     Musculoskeletal: Strength & Muscle Tone: within normal limits Gait & Station: normal Patient leans: N/A  Psychiatric Specialty Exam: Physical Exam  Nursing note and vitals reviewed. Constitutional: She is oriented to person, place, and time. She appears well-developed and well-nourished.  Cardiovascular: Normal rate.  Respiratory: Effort normal.  Neurological: She is alert and oriented to person, place, and time.    Review of Systems  Constitutional: Negative.   Respiratory: Negative for cough and shortness of breath.   Cardiovascular: Negative for chest pain.  Psychiatric/Behavioral: Negative for depression, hallucinations, substance abuse and suicidal ideas. The patient is not nervous/anxious and does not have insomnia.     Blood pressure 126/89, pulse 96, temperature 97.8 F (36.6 C), temperature source Oral, resp. rate 18, SpO2 98 %.There is no height or weight on file to  calculate BMI.  General Appearance: Well Groomed  Eye Contact:  Good  Speech:  Normal Rate  Volume:  Normal  Mood:  Euthymic  Affect:  Congruent  Thought Process:  Coherent  Orientation:  Full (Time, Place, and Person)  Thought Content:  Logical  Suicidal Thoughts:  No  Homicidal Thoughts:  No  Memory:  Immediate;   Good Recent;   Good  Judgement:  Fair  Insight:  Fair  Psychomotor Activity:  Normal  Concentration:  Concentration: Good and Attention Span: Good  Recall:  Good  Fund of Knowledge:  Good  Language:  Good  Akathisia:  No  Handed:  Right  AIMS (if indicated):     Assets:  Communication Skills Desire for Improvement Financial Resources/Insurance Housing Physical Health Resilience Vocational/Educational  ADL's:  Intact  Cognition:  WNL  Sleep:  Number of Hours: 3.5     Treatment Plan Summary: Daily contact with patient to assess and evaluate symptoms and progress in treatment and Medication management   Continue inpatient hospitalization.  Continue lithium 600 mg PO BID for mood Continue Trileptal 300 mg PO BID for mood Continue Trilafon 16 mg PO BID for psychosis Continue Coreg 12.5 mg PO BID for HTN Continue Vistaril 50 mg PO QHS for insomnia Continue Restoril 30 mg PO QHS PRN insomnia  Patient will participate in the therapeutic group milieu.  Discharge disposition in progress.   [name], NP 10/03/2019, 10:11 AM   Attest to NP progress note

## 2019-10-03 NOTE — Progress Notes (Signed)
Patient ID: Lisa [name], female   DOB: 06/29/1991, 28 y.o.   MRN: [id number]    [place] NOVEL CORONAVIRUS (COVID-19) DAILY CHECK-OFF SYMPTOMS - answer yes or no to each - every day NO YES  Have you had a fever in the past 24 hours?  . Fever (Temp > 37.80C / 100F) X   Have you had any of these symptoms in the past 24 hours? . New Cough .  Sore Throat  .  Shortness of Breath .  Difficulty Breathing .  Unexplained Body Aches   X   Have you had any one of these symptoms in the past 24 hours not related to allergies?   . Runny Nose .  Nasal Congestion .  Sneezing   X   If you have had runny nose, nasal congestion, sneezing in the past 24 hours, has it worsened?  X   EXPOSURES - check yes or no X   Have you traveled outside the state in the past 14 days?  X   Have you been in contact with someone with a confirmed diagnosis of COVID-19 or PUI in the past 14 days without wearing appropriate PPE?  X   Have you been living in the same home as a person with confirmed diagnosis of COVID-19 or a PUI (household contact)?    X   Have you been diagnosed with COVID-19?    X              What to do next: Answered NO to all: Answered YES to anything:   Proceed with unit schedule Follow the [facility] Inpatient Flowsheet.   

## 2019-10-04 LAB — PREGNANCY, URINE: Preg Test, Ur: NEGATIVE

## 2019-10-04 LAB — LITHIUM LEVEL: Lithium Lvl: 0.59 mmol/L — ABNORMAL LOW (ref 0.60–1.20)

## 2019-10-04 NOTE — Progress Notes (Signed)
Patient ID: Lisa [name], female   DOB: [birth year], 28 y.o.   MRN: [id number]    [place] NOVEL CORONAVIRUS (COVID-19) DAILY CHECK-OFF SYMPTOMS - answer yes or no to each - every day NO YES  Have you had a fever in the past 24 hours?  . Fever (Temp > 37.80C / 100F) X   Have you had any of these symptoms in the past 24 hours? . New Cough .  Sore Throat  .  Shortness of Breath .  Difficulty Breathing .  Unexplained Body Aches   X   Have you had any one of these symptoms in the past 24 hours not related to allergies?   . Runny Nose .  Nasal Congestion .  Sneezing   X   If you have had runny nose, nasal congestion, sneezing in the past 24 hours, has it worsened?  X   EXPOSURES - check yes or no X   Have you traveled outside the state in the past 14 days?  X   Have you been in contact with someone with a confirmed diagnosis of COVID-19 or PUI in the past 14 days without wearing appropriate PPE?  X   Have you been living in the same home as a person with confirmed diagnosis of COVID-19 or a PUI (household contact)?    X   Have you been diagnosed with COVID-19?    X              What to do next: Answered NO to all: Answered YES to anything:   Proceed with unit schedule Follow the [facility] Inpatient Flowsheet.

## 2019-10-04 NOTE — Plan of Care (Signed)
  Problem: Activity: Goal: Interest or engagement in activities will improve Outcome: Progressing   Problem: Coping: Goal: Ability to verbalize frustrations and anger appropriately will improve Outcome: Progressing Goal: Ability to demonstrate self-control will improve Outcome: Progressing   Problem: Safety: Goal: Periods of time without injury will increase Outcome: Progressing   

## 2019-10-04 NOTE — Progress Notes (Addendum)
[facility]  10/04/2019 8:10 AM Lisa AbedCorina J [name]  MRN:  [id number]   Subjective:  Lisa GuppyCorina reported " I think I may be pregnant and I just got my period today."  Lisa [name] observed sitting on bed eating breakfast.  She is awake alert and oriented x3.  Denied suicidal or homicidal ideations.  Denies auditory or visual hallucinations.  Patient reports taking and tolerating medications well.  Patient has concerns of possible pregnancy.  NP will order urine pregnancy test.  Patient was receptive to plan.  Stated her depression is a 5 out of 10 with 10 being the worst.  Patient became tearful when discussing her family.  Lisa [name] stated"  I miss my family" reports a good appetite.  States she has been doing well throughout the night.  She continues to express concerns "discharging soon.  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  We will continue to monitor for safety.  Support, encouragement and reassurance was provided.  From admission H&P: This is the sixth psychiatric admission for Lisa [name],and she was recently here from 9/28 to 10/6-thus she is admitted exactly 1 week after her last discharge. Despite long-acting injectable aripiprazole administered on 10/2 at 400 mg IM, as well as lithium and Risperdal at 8 mg at bedtime, the patient presented on 10/12 with paranoia, some unusual ideas of reference, believing that her current psychiatrist had been "receiving messages from her,and sending them to her"  Principal Problem: <principal problem not specified> Diagnosis: Active Problems:   Schizoaffective disorder, bipolar type (HCC)  Total Time spent with patient: 15 minutes  Past Psychiatric History: See admission H&P  Past Medical History:  Past Medical History:  Diagnosis Date  . Anxiety     Past Surgical History:  Procedure Laterality Date  . extraction of wisdom teeth     Family History:  Family History  Problem Relation Age of Onset  . Schizophrenia Mother    . Depression Father   . Seizures Sister    Family Psychiatric  History: See admission H&P Social History:  Social History   Substance and Sexual Activity  Alcohol Use Yes   Comment: casually     Social History   Substance and Sexual Activity  Drug Use No  . Types: Marijuana   Comment: THC last used 2 yrs ago, unsure of amount    Social History   Socioeconomic History  . Marital status: Single    Spouse name: Not on file  . Number of children: Not on file  . Years of education: Not on file  . Highest education level: Not on file  Occupational History  . Not on file  Social Needs  . Financial resource strain: Not on file  . Food insecurity    Worry: Not on file    Inability: Not on file  . Transportation needs    Medical: Not on file    Non-medical: Not on file  Tobacco Use  . Smoking status: Former Smoker    Packs/day: 0.25    Years: 0.50    Pack years: 0.12    Types: Cigarettes    Quit date: 09/03/2015    Years since quitting: 4.0  . Smokeless tobacco: Never Used  Substance and Sexual Activity  . Alcohol use: Yes    Comment: casually  . Drug use: No    Types: Marijuana    Comment: THC last used 2 yrs ago, unsure of amount  . Sexual activity: Not Currently  Comment: Plan B Pill  Lifestyle  . Physical activity    Days per week: Not on file    Minutes per session: Not on file  . Stress: Not on file  Relationships  . Social Musician on phone: Not on file    Gets together: Not on file    Attends religious service: Not on file    Active member of club or organization: Not on file    Attends meetings of clubs or organizations: Not on file    Relationship status: Not on file  Other Topics Concern  . Not on file  Social History Narrative  . Not on file   Additional Social History:    Pain Medications: see MAR Prescriptions: see MAR Over the Counter: see MAR History of alcohol / drug use?: No history of alcohol / drug abuse                     Sleep: Fair  Appetite:  Good  Current Medications: Current Facility-Administered Medications  Medication Dose Route Frequency Provider Last Rate Last Dose  . acetaminophen (TYLENOL) tablet 650 mg  650 mg Oral Q6H PRN Anike, Adaku C, NP      . carvedilol (COREG) tablet 12.5 mg  12.5 mg Oral BID WC Malvin Johns, MD   12.5 mg at 10/04/19 0735  . clonazePAM (KLONOPIN) tablet 2 mg  2 mg Oral Once Malvin Johns, MD      . haloperidol lactate (HALDOL) injection 10 mg  10 mg Intramuscular Q6H PRN Malvin Johns, MD   10 mg at 09/20/19 0243  . haloperidol lactate (HALDOL) injection 10 mg  10 mg Intramuscular BID PRN Malvin Johns, MD      . hydrOXYzine (ATARAX/VISTARIL) tablet 50 mg  50 mg Oral QHS Malvin Johns, MD   50 mg at 10/03/19 2114  . lithium carbonate (LITHOBID) CR tablet 600 mg  600 mg Oral Q12H Malvin Johns, MD   600 mg at 10/04/19 0735  . LORazepam (ATIVAN) tablet 2 mg  2 mg Oral Q4H PRN Malvin Johns, MD   2 mg at 09/30/19 0242   Or  . LORazepam (ATIVAN) injection 2 mg  2 mg Intramuscular Q4H PRN Malvin Johns, MD   2 mg at 09/17/19 1339  . LORazepam (ATIVAN) injection 4 mg  4 mg Intramuscular Once Malvin Johns, MD      . magnesium hydroxide (MILK OF MAGNESIA) suspension 30 mL  30 mL Oral Daily PRN Nira Conn A, NP      . neomycin-bacitracin-polymyxin (NEOSPORIN) ointment   Topical PRN Jearld Lesch, NP      . nicotine polacrilex (NICORETTE) gum 2 mg  2 mg Oral PRN Jearld Lesch, NP   2 mg at 09/28/19 1308  . Oxcarbazepine (TRILEPTAL) tablet 300 mg  300 mg Oral BID Antonieta Pert, MD   300 mg at 10/04/19 0735  . perphenazine (TRILAFON) tablet 16 mg  16 mg Oral BID Antonieta Pert, MD   16 mg at 10/04/19 0735  . temazepam (RESTORIL) capsule 30 mg  30 mg Oral QHS PRN Malvin Johns, MD   30 mg at 10/02/19 2113    Lab Results: No results found for this or any previous visit (from the past 48 hour(s)).  Blood Alcohol level:  Lab Results  Component Value Date    [facility] <10 09/15/2019   ETH <10 08/31/2019    Metabolic Disorder Labs: Lab Results  Component Value Date  HGBA1C 5.3 09/15/2019   MPG 105.41 09/15/2019   MPG 103 05/14/2015   Lab Results  Component Value Date   PROLACTIN 16.5 09/15/2019   PROLACTIN 18.6 06/10/2015   Lab Results  Component Value Date   CHOL 210 (H) 09/15/2019   TRIG 97 09/15/2019   HDL 60 09/15/2019   CHOLHDL 3.5 09/15/2019   VLDL 19 09/15/2019   LDLCALC 131 (H) 09/15/2019   LDLCALC 77 05/14/2015    Physical Findings: AIMS: Facial and Oral Movements Muscles of Facial Expression: None, normal Lips and Perioral Area: None, normal Jaw: None, normal Tongue: None, normal,Extremity Movements Upper (arms, wrists, hands, fingers): None, normal Lower (legs, knees, ankles, toes): None, normal, Trunk Movements Neck, shoulders, hips: None, normal, Overall Severity Severity of abnormal movements (highest score from questions above): None, normal Incapacitation due to abnormal movements: None, normal Patient's awareness of abnormal movements (rate only patient's report): No Awareness, Dental Status Current problems with teeth and/or dentures?: No Does patient usually wear dentures?: No  CIWA:    COWS:     Musculoskeletal: Strength & Muscle Tone: within normal limits Gait & Station: normal Patient leans: N/A  Psychiatric Specialty Exam: Physical Exam  Nursing note and vitals reviewed. Constitutional: She is oriented to person, place, and time. She appears well-developed and well-nourished.  Cardiovascular: Normal rate.  Respiratory: Effort normal.  Neurological: She is alert and oriented to person, place, and time.    Review of Systems  Constitutional: Negative.   Psychiatric/Behavioral: Positive for depression. Negative for hallucinations, substance abuse and suicidal ideas. The patient is not nervous/anxious and does not have insomnia.     Blood pressure 120/71, pulse 84, temperature 97.8 F (36.6 C),  temperature source Oral, resp. rate 18, SpO2 98 %.There is no height or weight on file to calculate BMI.  General Appearance: Well Groomed  Eye Contact:  Good  Speech:  Normal Rate  Volume:  Normal  Mood:  Euthymic  Affect:  Congruent  Thought Process:  Coherent  Orientation:  Full (Time, Place, and Person)  Thought Content:  Logical  Suicidal Thoughts:  No  Homicidal Thoughts:  No  Memory:  Immediate;   Good Recent;   Good  Judgement:  Fair  Insight:  Fair  Psychomotor Activity:  Normal  Concentration:  Concentration: Good and Attention Span: Good  Recall:  Good  Fund of Knowledge:  Good  Language:  Good  Akathisia:  No  Handed:  Right  AIMS (if indicated):     Assets:  Communication Skills Desire for Improvement Financial Resources/Insurance Housing Physical Health Resilience Vocational/Educational  ADL's:  Intact  Cognition:  WNL  Sleep:  Number of Hours: 3.5     Treatment Plan Summary: Daily contact with patient to assess and evaluate symptoms and progress in treatment and Medication management   Continue her current treatment plan on 10/04/2019  Continue lithium 600 mg PO BID for mood Continue Trileptal 300 mg PO BID for mood Continue Trilafon 16 mg PO BID for psychosis Continue Coreg 12.5 mg PO BID for HTN Continue Vistaril 50 mg PO QHS for insomnia Continue Restoril 30 mg PO QHS PRN insomnia -Li level and urine pregnancy pending results 10/04/2019 Patient encouraged to participate in the therapeutic group milieu. CSW to continue working on discharge disposition.  Derrill Center, NP 10/04/2019, 8:10 AM   Attest to NP progress note

## 2019-10-04 NOTE — [facility] Group Notes (Signed)
[facility] LCSW Group Therapy Note  Date/Time:  10/04/2019  11:00AM-12:00PM  Type of Therapy and Topic:  Group Therapy:  Music and Mood  Participation Level:  Minimal   Description of Group: In this process group, members listened to a variety of genres of music and identified that different types of music evoke different responses.  Patients were encouraged to identify music that was soothing for them and music that was energizing for them.  Patients discussed how this knowledge can help with wellness and recovery in various ways including managing depression and anxiety as well as encouraging healthy sleep habits.    Therapeutic Goals: 1. Patients will explore the impact of different varieties of music on mood 2. Patients will verbalize the thoughts they have when listening to different types of music 3. Patients will identify music that is soothing to them as well as music that is energizing to them 4. Patients will discuss how to use this knowledge to assist in maintaining wellness and recovery 5. Patients will explore the use of music as a coping skill  Summary of Patient Progress:  At the beginning of group, patient expressed that she felt "okay" with anxiety rated a 5 out of 10 and depression rated a 5 out of 10.  She sang one song loudly and said it was "my song."  At about the halfway point of group, she left without explanation and did not return.    Therapeutic Modalities: Solution Focused Brief Therapy Activity   Selmer Dominion, LCSW

## 2019-10-05 MED ORDER — CARBAMAZEPINE 200 MG PO TABS
200.0000 mg | ORAL_TABLET | Freq: Three times a day (TID) | ORAL | Status: DC
  Administered 2019-10-05 – 2019-10-07 (×5): 200 mg via ORAL
  Filled 2019-10-05 (×10): qty 1

## 2019-10-05 NOTE — Tx Team (Signed)
Interdisciplinary Treatment and Diagnostic Plan Update  10/05/2019 Time of Session: 10:55am Lisa AbedCorina J [name] MRN: [id number]  Principal Diagnosis: <principal problem not specified>  Secondary Diagnoses: Active Problems:   Schizoaffective disorder, bipolar type (HCC)   Current Medications:  Current Facility-Administered Medications  Medication Dose Route Frequency Provider Last Rate Last Dose  . acetaminophen (TYLENOL) tablet 650 mg  650 mg Oral Q6H PRN Anike, Adaku C, NP      . carvedilol (COREG) tablet 12.5 mg  12.5 mg Oral BID WC Malvin JohnsFarah, Brian, MD   12.5 mg at 10/05/19 0917  . clonazePAM (KLONOPIN) tablet 2 mg  2 mg Oral Once Malvin JohnsFarah, Brian, MD      . haloperidol lactate (HALDOL) injection 10 mg  10 mg Intramuscular Q6H PRN Malvin JohnsFarah, Brian, MD   10 mg at 09/20/19 0243  . haloperidol lactate (HALDOL) injection 10 mg  10 mg Intramuscular BID PRN Malvin JohnsFarah, Brian, MD      . hydrOXYzine (ATARAX/VISTARIL) tablet 50 mg  50 mg Oral QHS Malvin JohnsFarah, Brian, MD   50 mg at 10/04/19 2002  . lithium carbonate (LITHOBID) CR tablet 600 mg  600 mg Oral Q12H Malvin JohnsFarah, Brian, MD   300 mg at 10/04/19 2001  . LORazepam (ATIVAN) tablet 2 mg  2 mg Oral Q4H PRN Malvin JohnsFarah, Brian, MD   2 mg at 09/30/19 0242   Or  . LORazepam (ATIVAN) injection 2 mg  2 mg Intramuscular Q4H PRN Malvin JohnsFarah, Brian, MD   2 mg at 09/17/19 1339  . LORazepam (ATIVAN) injection 4 mg  4 mg Intramuscular Once Malvin JohnsFarah, Brian, MD      . magnesium hydroxide (MILK OF MAGNESIA) suspension 30 mL  30 mL Oral Daily PRN Nira ConnBerry, Jason A, NP      . neomycin-bacitracin-polymyxin (NEOSPORIN) ointment   Topical PRN Jearld Leschixon, Rashaun M, NP      . nicotine polacrilex (NICORETTE) gum 2 mg  2 mg Oral PRN Jearld Leschixon, Rashaun M, NP   2 mg at 09/28/19 1308  . Oxcarbazepine (TRILEPTAL) tablet 300 mg  300 mg Oral BID Antonieta Pertlary, Greg Lawson, MD   300 mg at 10/05/19 [id number]  . perphenazine (TRILAFON) tablet 16 mg  16 mg Oral BID Antonieta Pertlary, Greg Lawson, MD   16 mg at 10/05/19 0917  . temazepam (RESTORIL) capsule  30 mg  30 mg Oral QHS PRN Malvin JohnsFarah, Brian, MD   30 mg at 10/02/19 2113   PTA Medications: Medications Prior to Admission  Medication Sig Dispense Refill Last Dose  . ARIPiprazole (ABILIFY) 10 MG tablet Take 10 mg by mouth daily.     . ARIPiprazole ER (ABILIFY MAINTENA) 400 MG SRER injection Inject 2 mLs (400 mg total) into the muscle every 28 (twenty-eight) days. DUE 11/2 1 each 11 09/04/2019  . benztropine (COGENTIN) 1 MG tablet Take 1 tablet (1 mg total) by mouth 2 (two) times daily. 60 tablet 2   . carvedilol (COREG) 12.5 MG tablet Take 1 tablet (12.5 mg total) by mouth 2 (two) times daily with a meal. 60 tablet 2   . levonorgestrel-ethinyl estradiol (SEASONALE) 0.15-0.03 MG tablet Take 1 tablet by mouth daily.     [name]. lithium carbonate (ESKALITH) 450 MG CR tablet Take 1 tablet (450 mg total) by mouth every 12 (twelve) hours. 60 tablet 2   . LORazepam (ATIVAN) 2 MG tablet Take 2 mg by mouth daily.     [name]. omega-3 acid ethyl esters (LOVAZA) 1 g capsule Take 1 capsule (1 g total) by mouth 2 (two) times daily.  60 capsule 11   . temazepam (RESTORIL) 30 MG capsule Take 1 capsule (30 mg total) by mouth at bedtime. 30 capsule 0   . risperiDONE (RISPERDAL) 4 MG tablet Take 2 tablets (8 mg total) by mouth at bedtime. (Patient not taking: Reported on 09/15/2019) 60 tablet 2 Not Taking at Unknown time    Patient Stressors:    Patient Strengths:    Treatment Modalities: Medication Management, Group therapy, Case management,  1 to 1 session with clinician, Psychoeducation, Recreational therapy.   Physician Treatment Plan for Primary Diagnosis: <principal problem not specified> Long Term Goal(s): Improvement in symptoms so as ready for discharge Improvement in symptoms so as ready for discharge   Short Term Goals: Ability to verbalize feelings will improve Ability to disclose and discuss suicidal ideas Ability to demonstrate self-control will improve Ability to identify and develop effective coping  behaviors will improve Ability to disclose and discuss suicidal ideas Ability to demonstrate self-control will improve Ability to identify and develop effective coping behaviors will improve Ability to maintain clinical measurements within normal limits will improve  Medication Management: Evaluate patient's response, side effects, and tolerance of medication regimen.  Therapeutic Interventions: 1 to 1 sessions, Unit Group sessions and Medication administration.  Evaluation of Outcomes: Progressing  Physician Treatment Plan for Secondary Diagnosis: Active Problems:   Schizoaffective disorder, bipolar type (HCC)  Long Term Goal(s): Improvement in symptoms so as ready for discharge Improvement in symptoms so as ready for discharge   Short Term Goals: Ability to verbalize feelings will improve Ability to disclose and discuss suicidal ideas Ability to demonstrate self-control will improve Ability to identify and develop effective coping behaviors will improve Ability to disclose and discuss suicidal ideas Ability to demonstrate self-control will improve Ability to identify and develop effective coping behaviors will improve Ability to maintain clinical measurements within normal limits will improve     Medication Management: Evaluate patient's response, side effects, and tolerance of medication regimen.  Therapeutic Interventions: 1 to 1 sessions, Unit Group sessions and Medication administration.  Evaluation of Outcomes: Progressing   RN Treatment Plan for Primary Diagnosis: <principal problem not specified> Long Term Goal(s): Knowledge of disease and therapeutic regimen to maintain health will improve  Short Term Goals: Ability to participate in decision making will improve, Ability to verbalize feelings will improve, Ability to disclose and discuss suicidal ideas, Ability to identify and develop effective coping behaviors will improve and Compliance with prescribed medications  will improve  Medication Management: RN will administer medications as ordered by provider, will assess and evaluate patient's response and provide education to patient for prescribed medication. RN will report any adverse and/or side effects to prescribing provider.  Therapeutic Interventions: 1 on 1 counseling sessions, Psychoeducation, Medication administration, Evaluate responses to treatment, Monitor vital signs and CBGs as ordered, Perform/monitor CIWA, COWS, AIMS and Fall Risk screenings as ordered, Perform wound care treatments as ordered.  Evaluation of Outcomes: Progressing   LCSW Treatment Plan for Primary Diagnosis: <principal problem not specified> Long Term Goal(s): Safe transition to appropriate next level of care at discharge, Engage patient in therapeutic group addressing interpersonal concerns.  Short Term Goals: Engage patient in aftercare planning with referrals and resources and Increase skills for wellness and recovery  Therapeutic Interventions: Assess for all discharge needs, 1 to 1 time with Social worker, Explore available resources and support systems, Assess for adequacy in community support network, Educate family and significant other(s) on suicide prevention, Complete Psychosocial Assessment, Interpersonal group therapy.  Evaluation of  Outcomes: Progressing   Progress in Treatment: Attending groups: Yes. Participating in groups: Yes. Taking medication as prescribed: Yes. Toleration medication: Yes. Family/Significant other contact made: Yes, individual(s) contacted:  pt's father Patient understands diagnosis: Yes. Discussing patient identified problems/goals with staff: Yes. Medical problems stabilized or resolved: Yes. Denies suicidal/homicidal ideation: Yes. Issues/concerns per patient self-inventory: No. Other:   New problem(s) identified: No, Describe:  None  New Short Term/Long Term Goal(s): Medication stabilization, elimination of SI thoughts,  and development of a comprehensive mental wellness plan.   Patient Goals:  "to go home"  Discharge Plan or Barriers: Patient will be discharging home and following up with [facility] for therapy and medication management.   Reason for Continuation of Hospitalization: Mania Medication stabilization  Estimated Length of Stay: 1-2 days   Attendees: Patient:   10/05/2019   Physician: Dr. [name], MD 10/05/2019   Nursing: Benjamine Mola, RN 10/05/2019  RN Care Manager: 10/05/2019  Social Worker: Ardelle Anton, LCSW 10/05/2019   Recreational Therapist:  10/05/2019  Other:  10/05/2019  Other:  10/05/2019   Other: 10/05/2019      Scribe for Treatment Team: Trecia Rogers, LCSW 10/05/2019 2:56 PM

## 2019-10-05 NOTE — Progress Notes (Signed)
Recreation Therapy Notes  Date: 11.2.20 Time: 1000 Location: 500 Hall Dayroom  Group Topic: Goal Setting  Goal Area(s) Addresses:  Patient will be able to identify at least 3 life goals.  Patient will be able to identify benefit of investing in life goals.  Patient will be able to identify benefit of setting life goals.   Intervention: Worksheet, pencils  Activity: Goal Planning.  Patients were to identify goals they want to accomplish in the next week, month, year and 5 years.  Patients were to then identify obstacles to reaching goals, what would be needed to reach those goals and what they can start doing now to work towards goals.  Education:  Discharge Planning, Radiographer, therapeutic, Leisure Education   Education Outcome: Acknowledges Education/In Group Clarification Provided/Needs Additional Education  Clinical Observations: Pt did not attend group.    Victorino Sparrow, LRT/CTRS         Victorino Sparrow A 10/05/2019 11:52 AM

## 2019-10-05 NOTE — Progress Notes (Signed)
[facility]  10/05/2019 10:49 AM Lisa AbedCorina J Risse  MRN:  [id number] Subjective:   Patient wanted to see me with great urgency because she believes she is pregnant, she is reassured that her pregnancy test was negative on 11/1 she is not reassured she is argumentative.  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 Anxiety     Past Surgical History:  Procedure Laterality Date  . extraction of wisdom teeth     Family History:  Family History  Problem Relation Age of Onset  . Schizophrenia Mother   . Depression Father   . Seizures Sister    Family Psychiatric  History: no new data Social History:  Social History   Substance and Sexual Activity  Alcohol Use Yes   Comment: casually     Social History   Substance and Sexual Activity  Drug Use No  . Types: Marijuana   Comment: THC last used 2 yrs ago, unsure of amount    Social History   Socioeconomic History  . Marital status: Single    Spouse name: Not on file  . Number of children: Not on file  . Years of education: Not on file  . Highest education level: Not on file  Occupational History  . Not on file  Social Needs  . Financial resource strain: Not on file  . Food insecurity    Worry: Not on file    Inability: Not on file  . Transportation needs    Medical: Not on file    Non-medical: Not on file  Tobacco Use  . Smoking status: Former Smoker    Packs/day: 0.25    Years: 0.50    Pack  years: 0.12    Types: Cigarettes    Quit date: 09/03/2015    Years since quitting: 4.0  . Smokeless tobacco: Never Used  Substance and Sexual Activity  . Alcohol use: Yes    Comment: casually  . Drug use: No    Types: Marijuana    Comment: THC last used 2 yrs ago, unsure of amount  . Sexual activity: Not Currently    Comment: Plan B Pill  Lifestyle  . Physical activity    Days per week: Not on file    Minutes per session: Not on file  . Stress: Not on file  Relationships  . Social Musicianconnections    Talks on phone: Not on file    Gets together: Not on file    Attends religious service: Not on file    Active member of club or organization: Not on file    Attends meetings of clubs or organizations: Not on file    Relationship status: Not on file  Other Topics Concern  . Not on file  Social History Narrative  . Not on file   Additional Social History:    Pain Medications: see MAR Prescriptions: see MAR Over the Counter: see MAR History of alcohol / drug use?: No history of alcohol / drug abuse  Sleep: Fair  Appetite:  Fair  Current Medications: Current Facility-Administered Medications  Medication Dose Route Frequency Provider Last Rate Last Dose  . acetaminophen (TYLENOL) tablet 650 mg  650 mg Oral Q6H PRN Anike, Adaku C, NP      . carvedilol (COREG) tablet 12.5 mg  12.5 mg Oral BID WC Malvin Johns, MD   12.5 mg at 10/05/19 0917  . clonazePAM (KLONOPIN) tablet 2 mg  2 mg Oral Once Malvin Johns, MD      . haloperidol lactate (HALDOL) injection 10 mg  10 mg Intramuscular Q6H PRN Malvin Johns, MD   10 mg at 09/20/19 0243  . haloperidol lactate (HALDOL) injection 10 mg  10 mg Intramuscular BID PRN Malvin Johns, MD      . hydrOXYzine (ATARAX/VISTARIL) tablet 50 mg  50 mg Oral QHS Malvin Johns, MD   50 mg at 10/04/19 2002  . lithium carbonate (LITHOBID) CR tablet 600 mg  600 mg Oral Q12H Malvin Johns, MD   300 mg at 10/04/19 2001  . LORazepam (ATIVAN)  tablet 2 mg  2 mg Oral Q4H PRN Malvin Johns, MD   2 mg at 09/30/19 0242   Or  . LORazepam (ATIVAN) injection 2 mg  2 mg Intramuscular Q4H PRN Malvin Johns, MD   2 mg at 09/17/19 1339  . LORazepam (ATIVAN) injection 4 mg  4 mg Intramuscular Once Malvin Johns, MD      . magnesium hydroxide (MILK OF MAGNESIA) suspension 30 mL  30 mL Oral Daily PRN Nira Conn A, NP      . neomycin-bacitracin-polymyxin (NEOSPORIN) ointment   Topical PRN Jearld Lesch, NP      . nicotine polacrilex (NICORETTE) gum 2 mg  2 mg Oral PRN Jearld Lesch, NP   2 mg at 09/28/19 1308  . Oxcarbazepine (TRILEPTAL) tablet 300 mg  300 mg Oral BID Antonieta Pert, MD   300 mg at 10/05/19 7062  . perphenazine (TRILAFON) tablet 16 mg  16 mg Oral BID Antonieta Pert, MD   16 mg at 10/05/19 0917  . temazepam (RESTORIL) capsule 30 mg  30 mg Oral QHS PRN Malvin Johns, MD   30 mg at 10/02/19 2113    Lab Results:  Results for orders placed or performed during the hospital encounter of 09/14/19 (from the past 48 hour(s))  Pregnancy, urine     Status: None   Collection Time: 10/04/19  8:36 AM  Result Value Ref Range   Preg Test, Ur NEGATIVE NEGATIVE    Comment:        THE SENSITIVITY OF THIS METHODOLOGY IS >20 mIU/mL. Performed at [facility], [address], [place], [place] [address]   Lithium level     Status: Abnormal   Collection Time: 10/04/19  6:05 PM  Result Value Ref Range   Lithium Lvl 0.59 (L) 0.60 - 1.20 mmol/L    Comment: Performed at [facility], [address], [place], [place] [address]    Blood Alcohol level:  Lab Results  Component Value Date   [facility] <10 09/15/2019   ETH <10 08/31/2019    Metabolic Disorder Labs: Lab Results  Component Value Date   HGBA1C 5.3 09/15/2019   MPG 105.41 09/15/2019   MPG 103 05/14/2015   Lab Results  Component Value Date   PROLACTIN 16.5 09/15/2019   PROLACTIN 18.6 06/10/2015   Lab Results  Component Value Date    CHOL 210 (H) 09/15/2019   TRIG 97  09/15/2019   HDL 60 09/15/2019   CHOLHDL 3.5 09/15/2019   VLDL 19 09/15/2019   LDLCALC 131 (H) 09/15/2019   LDLCALC 77 05/14/2015    Physical Findings: AIMS: Facial and Oral Movements Muscles of Facial Expression: None, normal Lips and Perioral Area: None, normal Jaw: None, normal Tongue: None, normal,Extremity Movements Upper (arms, wrists, hands, fingers): None, normal Lower (legs, knees, ankles, toes): None, normal, Trunk Movements Neck, shoulders, hips: None, normal, Overall Severity Severity of abnormal movements (highest score from questions above): None, normal Incapacitation due to abnormal movements: None, normal Patient's awareness of abnormal movements (rate only patient's report): No Awareness, Dental Status Current problems with teeth and/or dentures?: No Does patient usually wear dentures?: No  CIWA:    COWS:     Musculoskeletal: Strength & Muscle Tone: within normal limits Gait & Station: normal Patient leans: N/A  Psychiatric Specialty Exam: Physical Exam  ROS  Blood pressure 110/78, pulse 89, temperature 98.1 F (36.7 C), temperature source Oral, resp. rate 18, SpO2 98 %.There is no height or weight on file to calculate BMI.  General Appearance: Casual  Eye Contact:  Good  Speech:  Pressured  Volume:  Increased  Mood:  Irritable and manic  Affect:  Congruent and Labile  Thought Process:  Linear and Descriptions of Associations: Circumstantial  Orientation:  Full (Time, Place, and Person)  Thought Content:  Illogical and Delusions  Suicidal Thoughts:  No  Homicidal Thoughts:  No  Memory:  Immediate;   Fair Recent;   Fair long term better  Judgement:  Impaired  Insight:  Shallow  Psychomotor Activity:  Increased  Concentration:  Concentration: Fair and Attention Span: Fair  Recall:  [employer] of Knowledge:  Fair  Language:  Fair  Akathisia:  Negative  Handed:  Right  AIMS (if indicated):     Assets:  Leisure  Time Physical Health Resilience Social Support Talents/Skills Vocational/Educational  ADL's:  Intact  Cognition:  WNL  Sleep:  Number of Hours: 3.5    Treatment Plan Summary: Daily contact with patient to assess and evaluate symptoms and progress in treatment and Medication management continue combination antipsychotic mood stabilizer therapy continue reality based therapy continue redirection and use.  Medications as needed no change in precautions continue to be in touch with family  [name], MD 10/05/2019, 10:49 AM

## 2019-10-05 NOTE — Progress Notes (Signed)
DAR NOTE: Patient presents with anxious affect and mood.  Denies suicidal thoughts, pain, auditory and visual hallucinations.  Described energy level as normal and concentration as good.  Rates depression at 3, hopelessness at 3, and anxiety at 3.  Maintained on routine safety checks.  Medications given as prescribed.  Support and encouragement offered as needed.  Attended group and participated.  States goal for today is "keeping myself and loved ones safe and going home."  Patient observed socializing with peers in the dayroom.  Offered no complaint.

## 2019-10-05 NOTE — Progress Notes (Signed)
Adult Psychoeducational Group Note  Date:  10/05/2019 Time:  9:42 PM  Group Topic/Focus:  Wrap-Up Group:   The focus of this group is to help patients review their daily goal of treatment and discuss progress on daily workbooks.  Participation Level:  Active  Participation Quality:  Appropriate  Affect:  Appropriate  Cognitive:  Appropriate  Insight: Appropriate  Engagement in Group:  Engaged  Modes of Intervention:  Discussion  Additional Comments:  Pt said her day was a 7. The one positive thing that happen to her today she was able to laugh and enjoy herself.  Lenice Llamas Long 10/05/2019, 9:42 PM

## 2019-10-05 NOTE — [facility] Group Notes (Signed)
[facility] LCSW Group Therapy Note  Date/Time: 10/05/2019 @ 1:30pm  Type of Therapy and Topic:  Group Therapy:  Overcoming Obstacles  Participation Level:  [facility] PARTICIPATION LEVEL: Active  Description of Group:    In this group patients will be encouraged to explore what they see as obstacles to their own wellness and recovery. They will be guided to discuss their thoughts, feelings, and behaviors related to these obstacles. The group will process together ways to cope with barriers, with attention given to specific choices patients can make. Each patient will be challenged to identify changes they are motivated to make in order to overcome their obstacles. This group will be process-oriented, with patients participating in exploration of their own experiences as well as giving and receiving support and challenge from other group members.  Therapeutic Goals: 1. Patient will identify personal and current obstacles as they relate to admission. 2. Patient will identify barriers that currently interfere with their wellness or overcoming obstacles.  3. Patient will identify feelings, thought process and behaviors related to these barriers. 4. Patient will identify two changes they are willing to make to overcome these obstacles:    Summary of Patient Progress   Patient was active and engaged throughout group therapy today. Patient was able to identify a personal obstacle which is her physical health of having MS symptoms. Patient stated that the barrier is that she cannot move around like she used to. Patient did not identify any changes that she is willing to make to overcome these obstacles.    Therapeutic Modalities:   Cognitive Behavioral Therapy Solution Focused Therapy Motivational Interviewing Relapse Prevention Therapy   Ardelle Anton, LCSW

## 2019-10-06 NOTE — Progress Notes (Signed)
DAR NOTE: Patient presents with anxious affect and mood.  Denies suicidal thoughts, pain, auditory and visual hallucinations.  Described energy level as normal and concentration as good.  Rates depression at 2, hopelessness at 2, and anxiety at 2.  Maintained on routine safety checks.  Medications given as prescribed.  Support and encouragement offered as needed.  Attended group and participated.  States goal for today is "going home."  Patient observed socializing with peers in the dayroom.  Patient is safe on and off the unit.  Offered no complaint.

## 2019-10-06 NOTE — Progress Notes (Signed)
Recreation Therapy Notes  Date: 11.3.20 Time: 1000 Location: 500 [address] Dayroom  Group Topic: Anger Management  Goal Area(s) Addresses:  Patient will identify triggers for anger.  Patient will identify a situation that makes them angry.  Patient will identify what other emotions comes with anger.   Behavioral Response: Engaged  Intervention: Worksheet, pencils  Activity: Introduction to Anger Management.  Patients were to identify three things that get them angry, what they do when they're angry and what problems they have run into because of anger.  Education: Anger Management, Discharge Planning   Education Outcome: Acknowledges education/In group clarification offered/Needs additional education.   Clinical Observations/Feedback: Pt identified things that get her angry are people being mean to other people, suffering and miscommunication.  Pt stated singing loudly, yelling, cursing and being pissed are what happens when she is angry.  Pt expressed her anger has gotten her put in the hospital.  Pt talked about how she doesn't like being misunderstood and how that can lead to getting angry.     Victorino Sparrow, LRT/CTRS         Victorino Sparrow A 10/06/2019 11:33 AM

## 2019-10-06 NOTE — Progress Notes (Signed)
The patient rated his day as a 6 out of a possible 10. She states that she was sad when she witnessed some of her peers being discharged, but felt better playing cards and having a visit with her father. She also mentioned that attending groups gave her the opportunity to express herself more than normal.

## 2019-10-06 NOTE — Progress Notes (Signed)
[facility] MD Progress Note  10/06/2019 11:33 AM Lisa [name]  MRN:  [id number] Subjective: Patient is a 28 year old female with a past psychiatric history significant for schizoaffective disorder; bipolar type who was admitted on 09/15/2019 secondary to bizarre delusions, agitation, and noncompliance with medications.  Objective: Patient is seen and examined.  Patient is a 28 year old female with the above-stated past psychiatric history who is seen in follow-up.  I last saw the patient on 09/27/2019.  The progress note from 11/2 stated that she continued to be delusional and intrusive.  She believes she was pregnant at that time, and was reassured that her pregnancy test had been negative on 11/1.  She was also quite manic seeing the star spangle Alpha Gula out in the day room.  At least as of yesterday she refused a lithium level that had been ordered.  Her current medications include Tegretol 200 mg p.o. 3 times daily, carvedilol 12.5 mg p.o. twice daily, lorazepam as needed, perphenazine 16 mg p.o. twice daily, and Restoril.  Her vital signs are stable, she is afebrile.  She slept 5.5 hours last night.  She actually looks fairly good compared to my last visit with her.  She still purely focuses on discharge planning, but is less intrusive, less agitated in the hallway.  She denied any side effects to the Tegretol.  We also discussed that she would need laboratories for this and it would be in 2 to 3 days more so that the medications could reach steady state.  She denied any auditory or visual hallucinations.  She still remains intrusive, but much less so than before.  She denied any suicidal or homicidal ideation.  Principal Problem: <principal problem not specified> Diagnosis: Active Problems:   Schizoaffective disorder, bipolar type ([place])  Total Time spent with patient: 20 minutes  Past Psychiatric History: See admission H&P  Past Medical History:  Past Medical History:  Diagnosis Date  . Anxiety      Past Surgical History:  Procedure Laterality Date  . extraction of wisdom teeth     Family History:  Family History  Problem Relation Age of Onset  . Schizophrenia Mother   . Depression Father   . Seizures Sister    Family Psychiatric  History: See admission H&P Social History:  Social History   Substance and Sexual Activity  Alcohol Use Yes   Comment: casually     Social History   Substance and Sexual Activity  Drug Use No  . Types: Marijuana   Comment: THC last used 2 yrs ago, unsure of amount    Social History   Socioeconomic History  . Marital status: Single    Spouse name: Not on file  . Number of children: Not on file  . Years of education: Not on file  . Highest education level: Not on file  Occupational History  . Not on file  Social Needs  . Financial resource strain: Not on file  . Food insecurity    Worry: Not on file    Inability: Not on file  . Transportation needs    Medical: Not on file    Non-medical: Not on file  Tobacco Use  . Smoking status: Former Smoker    Packs/day: 0.25    Years: 0.50    Pack years: 0.12    Types: Cigarettes    Quit date: 09/03/2015    Years since quitting: 4.0  . Smokeless tobacco: Never Used  Substance and Sexual Activity  . Alcohol use: Yes  Comment: casually  . Drug use: No    Types: Marijuana    Comment: THC last used 2 yrs ago, unsure of amount  . Sexual activity: Not Currently    Comment: Plan B Pill  Lifestyle  . Physical activity    Days per week: Not on file    Minutes per session: Not on file  . Stress: Not on file  Relationships  . Social Musicianconnections    Talks on phone: Not on file    Gets together: Not on file    Attends religious service: Not on file    Active member of club or organization: Not on file    Attends meetings of clubs or organizations: Not on file    Relationship status: Not on file  Other Topics Concern  . Not on file  Social History Narrative  . Not on file    Additional Social History:    Pain Medications: see MAR Prescriptions: see MAR Over the Counter: see MAR History of alcohol / drug use?: No history of alcohol / drug abuse                    Sleep: Fair  Appetite:  Good  Current Medications: Current Facility-Administered Medications  Medication Dose Route Frequency Provider Last Rate Last Dose  . acetaminophen (TYLENOL) tablet 650 mg  650 mg Oral Q6H PRN Anike, Adaku C, NP      . carbamazepine (TEGRETOL) tablet 200 mg  200 mg Oral TID Malvin JohnsFarah, Brian, MD   200 mg at 10/06/19 0743  . carvedilol (COREG) tablet 12.5 mg  12.5 mg Oral BID WC Malvin JohnsFarah, Brian, MD   12.5 mg at 10/06/19 0744  . clonazePAM (KLONOPIN) tablet 2 mg  2 mg Oral Once Malvin JohnsFarah, Brian, MD      . haloperidol lactate (HALDOL) injection 10 mg  10 mg Intramuscular Q6H PRN Malvin JohnsFarah, Brian, MD   10 mg at 09/20/19 0243  . haloperidol lactate (HALDOL) injection 10 mg  10 mg Intramuscular BID PRN Malvin JohnsFarah, Brian, MD      . hydrOXYzine (ATARAX/VISTARIL) tablet 50 mg  50 mg Oral QHS Malvin JohnsFarah, Brian, MD   50 mg at 10/05/19 2024  . LORazepam (ATIVAN) tablet 2 mg  2 mg Oral Q4H PRN Malvin JohnsFarah, Brian, MD   2 mg at 09/30/19 0242   Or  . LORazepam (ATIVAN) injection 2 mg  2 mg Intramuscular Q4H PRN Malvin JohnsFarah, Brian, MD   2 mg at 09/17/19 1339  . LORazepam (ATIVAN) injection 4 mg  4 mg Intramuscular Once Malvin JohnsFarah, Brian, MD      . magnesium hydroxide (MILK OF MAGNESIA) suspension 30 mL  30 mL Oral Daily PRN Nira ConnBerry, Jason A, NP      . neomycin-bacitracin-polymyxin (NEOSPORIN) ointment   Topical PRN Jearld Leschixon, Rashaun M, NP      . nicotine polacrilex (NICORETTE) gum 2 mg  2 mg Oral PRN Jearld Leschixon, Rashaun M, NP   2 mg at 09/28/19 1308  . perphenazine (TRILAFON) tablet 16 mg  16 mg Oral BID Antonieta Pertlary, Reanne Nellums Lawson, MD   16 mg at 10/06/19 0744  . temazepam (RESTORIL) capsule 30 mg  30 mg Oral QHS PRN Malvin JohnsFarah, Brian, MD   30 mg at 10/02/19 2113    Lab Results:  Results for orders placed or performed during the hospital  encounter of 09/14/19 (from the past 48 hour(s))  Lithium level     Status: Abnormal   Collection Time: 10/04/19  6:05 PM  Result Value  Ref Range   Lithium Lvl 0.59 (L) 0.60 - 1.20 mmol/L    Comment: Performed at [facility], [address] [place], [place] [address]    Blood Alcohol level:  Lab Results  Component Value Date   [facility] <10 09/15/2019   ETH <10 08/31/2019    Metabolic Disorder Labs: Lab Results  Component Value Date   HGBA1C 5.3 09/15/2019   MPG 105.41 09/15/2019   MPG 103 05/14/2015   Lab Results  Component Value Date   PROLACTIN 16.5 09/15/2019   PROLACTIN 18.6 06/10/2015   Lab Results  Component Value Date   CHOL 210 (H) 09/15/2019   TRIG 97 09/15/2019   HDL 60 09/15/2019   CHOLHDL 3.5 09/15/2019   VLDL 19 09/15/2019   LDLCALC 131 (H) 09/15/2019   LDLCALC 77 05/14/2015    Physical Findings: AIMS: Facial and Oral Movements Muscles of Facial Expression: None, normal Lips and Perioral Area: None, normal Jaw: None, normal Tongue: None, normal,Extremity Movements Upper (arms, wrists, hands, fingers): None, normal Lower (legs, knees, ankles, toes): None, normal, Trunk Movements Neck, shoulders, hips: None, normal, Overall Severity Severity of abnormal movements (highest score from questions above): None, normal Incapacitation due to abnormal movements: None, normal Patient's awareness of abnormal movements (rate only patient's report): No Awareness, Dental Status Current problems with teeth and/or dentures?: No Does patient usually wear dentures?: No  CIWA:    COWS:     Musculoskeletal: Strength & Muscle Tone: within normal limits Gait & Station: normal Patient leans: N/A  Psychiatric Specialty Exam: Physical Exam  Nursing note and vitals reviewed. Constitutional: She is oriented to person, place, and time. She appears well-developed and well-nourished.  HENT:  Head: Normocephalic and atraumatic.  Respiratory: Effort  normal.  Neurological: She is alert and oriented to person, place, and time.    ROS  Blood pressure 130/83, pulse 97, temperature 98.1 F (36.7 C), temperature source Oral, resp. rate 18, SpO2 98 %.There is no height or weight on file to calculate BMI.  General Appearance: Casual  Eye Contact:  Fair  Speech:  Pressured  Volume:  Increased  Mood:  Anxious and Dysphoric  Affect:  Labile  Thought Process:  Coherent and Descriptions of Associations: Circumstantial  Orientation:  Full (Time, Place, and Person)  Thought Content:  Delusions and Rumination  Suicidal Thoughts:  No  Homicidal Thoughts:  No  Memory:  Immediate;   Fair Recent;   Fair Remote;   Fair  Judgement:  Impaired  Insight:  Fair  Psychomotor Activity:  Increased  Concentration:  Concentration: Fair and Attention Span: Fair  Recall:  [employer] of Knowledge:  Fair  Language:  Good  Akathisia:  Negative  Handed:  Right  AIMS (if indicated):     Assets:  Desire for Improvement Resilience  ADL's:  Intact  Cognition:  WNL  Sleep:  Number of Hours: 5.5     Treatment Plan Summary: Daily contact with patient to assess and evaluate symptoms and progress in treatment, Medication management and Plan : Patient is seen and examined.  Patient is a 28 year old female with the above-stated past psychiatric history who is seen in follow-up.   Diagnosis: #1 schizoaffective disorder; bipolar type, #2 tachycardia, #3 hypertension  Patient is seen in follow-up.  She is definitely better than she was when I had seen her 2 weeks ago.  No change in her medications today.  Hopefully she will continue to improve and gain more mood stability. 1.  Continue  Tegretol 200 mg p.o. 3 times daily for mood stability. 2.  Continue carvedilol 12.5 mg p.o. twice daily for tachycardia and hypertension. 3.  Continue as needed Haldol 10 mg IM as needed agitation. 4.  Continue hydroxyzine 50 mg p.o. nightly for insomnia. 5.  Continue lorazepam 2 mg  p.o. or IM every 4 hours as needed agitation. 6.  Continue Trilafon 16 mg p.o. twice daily for psychosis and mood stability. 7.  Continue temazepam 30 mg p.o. nightly as needed insomnia. 8.  Tegretol levels, CBC with differential and liver function enzymes in 2 to 3 days as Tegretol reaches steady state. 9.  Disposition planning-in progress.  Antonieta Pert, MD 10/06/2019, 11:33 AM

## 2019-10-06 NOTE — Progress Notes (Signed)
Pt presented with bright affect and jovial mood. Pt stated she has been feeling better, reported good day and good interaction with peers. Pt took her evening medication and went bed with no issues, will continue to monitor.

## 2019-10-07 MED ORDER — PALIPERIDONE PALMITATE ER 117 MG/0.75ML IM SUSY: 117.0000 mg | PREFILLED_SYRINGE | INTRAMUSCULAR | 0 refills | Status: DC

## 2019-10-07 MED ORDER — CARBAMAZEPINE 200 MG PO TABS: 200.0000 mg | ORAL_TABLET | Freq: Three times a day (TID) | ORAL | 0 refills | Status: DC

## 2019-10-07 MED ORDER — OMEGA-3-ACID ETHYL ESTERS 1 G PO CAPS: 1.0000 g | ORAL_CAPSULE | Freq: Two times a day (BID) | ORAL | 0 refills | Status: DC

## 2019-10-07 MED ORDER — PALIPERIDONE PALMITATE ER 117 MG/0.75ML IM SUSY: 117.0000 mg | PREFILLED_SYRINGE | INTRAMUSCULAR | Status: DC

## 2019-10-07 MED ORDER — BENZTROPINE MESYLATE 1 MG PO TABS: 1.0000 mg | ORAL_TABLET | Freq: Two times a day (BID) | ORAL | 0 refills | Status: DC

## 2019-10-07 MED ORDER — TEMAZEPAM 30 MG PO CAPS: 30.0000 mg | ORAL_CAPSULE | Freq: Every evening | ORAL | 0 refills | Status: DC | PRN

## 2019-10-07 MED ORDER — PALIPERIDONE PALMITATE ER 234 MG/1.5ML IM SUSY: 234.0000 mg | PREFILLED_SYRINGE | INTRAMUSCULAR | Status: DC

## 2019-10-07 MED ORDER — HYDROXYZINE HCL 50 MG PO TABS: 50.0000 mg | ORAL_TABLET | Freq: Every day | ORAL | 0 refills | Status: DC

## 2019-10-07 MED ORDER — PERPHENAZINE 16 MG PO TABS: 16.0000 mg | ORAL_TABLET | Freq: Two times a day (BID) | ORAL | 0 refills | Status: DC

## 2019-10-07 MED ORDER — BACITRACIN-NEOMYCIN-POLYMYXIN OINTMENT TUBE: 1.0000 "application " | TOPICAL_OINTMENT | CUTANEOUS | Status: DC | PRN

## 2019-10-07 MED ORDER — NICOTINE POLACRILEX 2 MG MT GUM: 2.0000 mg | CHEWING_GUM | OROMUCOSAL | 0 refills | Status: DC | PRN

## 2019-10-07 MED ORDER — PALIPERIDONE PALMITATE ER 234 MG/1.5ML IM SUSY: 234.0000 mg | PREFILLED_SYRINGE | INTRAMUSCULAR | 0 refills | Status: DC

## 2019-10-07 NOTE — Tx Team (Signed)
Interdisciplinary Treatment and Diagnostic Plan Update  10/07/2019 Time of Session: 10:40am Lisa [name] MRN: [id number]  Principal Diagnosis: Schizoaffective disorder, bipolar type [facility])  Secondary Diagnoses: Principal Problem:   Schizoaffective disorder, bipolar type ([place])   Current Medications:  Current Facility-Administered Medications  Medication Dose Route Frequency Provider Last Rate Last Dose  . acetaminophen (TYLENOL) tablet 650 mg  650 mg Oral Q6H PRN Anike, Adaku C, NP   650 mg at 10/06/19 2055  . carbamazepine (TEGRETOL) tablet 200 mg  200 mg Oral TID [name], MD   200 mg at 10/07/19 0757  . carvedilol (COREG) tablet 12.5 mg  12.5 mg Oral BID WC [name], MD   12.5 mg at 10/07/19 0757  . clonazePAM (KLONOPIN) tablet 2 mg  2 mg Oral Once [name], MD      . haloperidol lactate (HALDOL) injection 10 mg  10 mg Intramuscular Q6H PRN [name], MD   10 mg at 09/20/19 0243  . haloperidol lactate (HALDOL) injection 10 mg  10 mg Intramuscular BID PRN [name], MD      . hydrOXYzine (ATARAX/VISTARIL) tablet 50 mg  50 mg Oral QHS [name], MD   50 mg at 10/06/19 2055  . LORazepam (ATIVAN) tablet 2 mg  2 mg Oral Q4H PRN [name], MD   2 mg at 09/30/19 0242   Or  . LORazepam (ATIVAN) injection 2 mg  2 mg Intramuscular Q4H PRN [name], MD   2 mg at 09/17/19 1339  . LORazepam (ATIVAN) injection 4 mg  4 mg Intramuscular Once [name], MD      . magnesium hydroxide (MILK OF MAGNESIA) suspension 30 mL  30 mL Oral Daily PRN Lindon Romp A, NP      . neomycin-bacitracin-polymyxin (NEOSPORIN) ointment   Topical PRN [name], NP      . nicotine polacrilex (NICORETTE) gum 2 mg  2 mg Oral PRN [name], NP   2 mg at 09/28/19 1308  . [START ON 10/30/2019] paliperidone (INVEGA SUSTENNA) injection 234 mg  234 mg Intramuscular Q28 days [name], MD      . perphenazine (TRILAFON) tablet 16 mg  16 mg Oral BID Sharma Covert, MD   16 mg at  10/07/19 0757  . temazepam (RESTORIL) capsule 30 mg  30 mg Oral QHS PRN [name], MD   30 mg at 10/02/19 2113   PTA Medications: Medications Prior to Admission  Medication Sig Dispense Refill Last Dose  . ARIPiprazole (ABILIFY) 10 MG tablet Take 10 mg by mouth daily.     . ARIPiprazole ER (ABILIFY MAINTENA) 400 MG SRER injection Inject 2 mLs (400 mg total) into the muscle every 28 (twenty-eight) days. DUE 11/2 1 each 11 09/04/2019  . carvedilol (COREG) 12.5 MG tablet Take 1 tablet (12.5 mg total) by mouth 2 (two) times daily with a meal. 60 tablet 2   . levonorgestrel-ethinyl estradiol (SEASONALE) 0.15-0.03 MG tablet Take 1 tablet by mouth daily.     [name] lithium carbonate (ESKALITH) 450 MG CR tablet Take 1 tablet (450 mg total) by mouth every 12 (twelve) hours. 60 tablet 2   . LORazepam (ATIVAN) 2 MG tablet Take 2 mg by mouth daily.     . temazepam (RESTORIL) 30 MG capsule Take 1 capsule (30 mg total) by mouth at bedtime. 30 capsule 0   . [DISCONTINUED] benztropine (COGENTIN) 1 MG tablet Take 1 tablet (1 mg total) by mouth 2 (two) times daily. [place]  tablet 2   . [DISCONTINUED] omega-3 acid ethyl esters (LOVAZA) 1 g capsule Take 1 capsule (1 g total) by mouth 2 (two) times daily. 60 capsule 11   . risperiDONE (RISPERDAL) 4 MG tablet Take 2 tablets (8 mg total) by mouth at bedtime. (Patient not taking: Reported on 09/15/2019) 60 tablet 2 Not Taking at Unknown time    Patient Stressors:    Patient Strengths:    Treatment Modalities: Medication Management, Group therapy, Case management,  1 to 1 session with clinician, Psychoeducation, Recreational therapy.   Physician Treatment Plan for Primary Diagnosis: Schizoaffective disorder, bipolar type (HCC) Long Term Goal(s): Improvement in symptoms so as ready for discharge Improvement in symptoms so as ready for discharge   Short Term Goals: Ability to verbalize feelings will improve Ability to disclose and discuss suicidal ideas Ability to  demonstrate self-control will improve Ability to identify and develop effective coping behaviors will improve Ability to disclose and discuss suicidal ideas Ability to demonstrate self-control will improve Ability to identify and develop effective coping behaviors will improve Ability to maintain clinical measurements within normal limits will improve  Medication Management: Evaluate patient's response, side effects, and tolerance of medication regimen.  Therapeutic Interventions: 1 to 1 sessions, Unit Group sessions and Medication administration.  Evaluation of Outcomes: Adequate for Discharge  Physician Treatment Plan for Secondary Diagnosis: Principal Problem:   Schizoaffective disorder, bipolar type (HCC)  Long Term Goal(s): Improvement in symptoms so as ready for discharge Improvement in symptoms so as ready for discharge   Short Term Goals: Ability to verbalize feelings will improve Ability to disclose and discuss suicidal ideas Ability to demonstrate self-control will improve Ability to identify and develop effective coping behaviors will improve Ability to disclose and discuss suicidal ideas Ability to demonstrate self-control will improve Ability to identify and develop effective coping behaviors will improve Ability to maintain clinical measurements within normal limits will improve     Medication Management: Evaluate patient's response, side effects, and tolerance of medication regimen.  Therapeutic Interventions: 1 to 1 sessions, Unit Group sessions and Medication administration.  Evaluation of Outcomes: Adequate for Discharge   RN Treatment Plan for Primary Diagnosis: Schizoaffective disorder, bipolar type (HCC) Long Term Goal(s): Knowledge of disease and therapeutic regimen to maintain health will improve  Short Term Goals: Ability to participate in decision making will improve, Ability to verbalize feelings will improve, Ability to disclose and discuss suicidal  ideas, Ability to identify and develop effective coping behaviors will improve and Compliance with prescribed medications will improve  Medication Management: RN will administer medications as ordered by provider, will assess and evaluate patient's response and provide education to patient for prescribed medication. RN will report any adverse and/or side effects to prescribing provider.  Therapeutic Interventions: 1 on 1 counseling sessions, Psychoeducation, Medication administration, Evaluate responses to treatment, Monitor vital signs and CBGs as ordered, Perform/monitor CIWA, COWS, AIMS and Fall Risk screenings as ordered, Perform wound care treatments as ordered.  Evaluation of Outcomes: Adequate for Discharge   LCSW Treatment Plan for Primary Diagnosis: Schizoaffective disorder, bipolar type (HCC) Long Term Goal(s): Safe transition to appropriate next level of care at discharge, Engage patient in therapeutic group addressing interpersonal concerns.  Short Term Goals: Engage patient in aftercare planning with referrals and resources and Increase skills for wellness and recovery  Therapeutic Interventions: Assess for all discharge needs, 1 to 1 time with Social worker, Explore available resources and support systems, Assess for adequacy in community support network, Educate family and  significant other(s) on suicide prevention, Complete Psychosocial Assessment, Interpersonal group therapy.  Evaluation of Outcomes: Adequate for Discharge   Progress in Treatment: Attending groups: Yes. Participating in groups: Yes. Taking medication as prescribed: Yes. Toleration medication: Yes. Family/Significant other contact made: Yes, individual(s) contacted:  pt's father Patient understands diagnosis: Yes. Discussing patient identified problems/goals with staff: Yes. Medical problems stabilized or resolved: Yes. Denies suicidal/homicidal ideation: Yes. Issues/concerns per patient self-inventory:  No. Other:   New problem(s) identified: No, Describe:  None  New Short Term/Long Term Goal(s): Medication stabilization, elimination of SI thoughts, and development of a comprehensive mental wellness plan.   Patient Goals:  'to go home'  Discharge Plan or Barriers: Patient will discharge home with her parents and start outpatient therapy and medication management with [facility] outpatient  Reason for Continuation of Hospitalization: Patient is discharging today.   Estimated Length of Stay: Patient is discharging today.   Attendees: Patient:  10/07/2019   Physician: Dr. Malvin Johns, MD 10/07/2019   Nursing: Luanne Bras 10/07/2019   RN Care Manager: 10/07/2019   Social Worker: Stephannie Peters, LCSW  10/07/2019   Recreational Therapist:  10/07/2019  Other: Earlyne Iba, MSW Intern 10/07/2019   Other:  10/07/2019  Other: 10/07/2019      Scribe for Treatment Team: Delphia Grates, LCSW 10/07/2019 11:38 AM

## 2019-10-07 NOTE — Progress Notes (Signed)
   10/07/19 0000  Psych Admission Type (Psych Patients Only)  Admission Status Voluntary  Psychosocial Assessment  Patient Complaints None  Eye Contact Fair  Facial Expression Anxious  Affect Anxious  Speech Logical/coherent  Interaction Assertive  Motor Activity Other (Comment) (WNL)  Appearance/Hygiene Unremarkable  Behavior Characteristics Cooperative  Mood Preoccupied  Thought Process  Coherency WDL  Content Obsessions;Preoccupation  Delusions None reported or observed  Perception WDL  Hallucination None reported or observed  Judgment Impaired  Confusion None  Danger to Self  Current suicidal ideation? Denies  Danger to Others  Danger to Others None reported or observed   Pt been appropriate on the unit this evening.

## 2019-10-07 NOTE — Progress Notes (Signed)
  [facility] Adult Case Management Discharge Plan :  Will you be returning to the same living situation after discharge:  Yes,  parent's house  At discharge, do you have transportation home?: Yes,  Pt will order Melburn Popper Do you have the ability to pay for your medications: Yes,  pt has [employer]   Release of information consent forms completed and in the chart;  Patient's signature needed at discharge.  Patient to Follow up at: Follow-up [facility] Follow up on 10/16/2019.   Specialty: Behavioral Health Why: Medication managemenet with Dr. Adele Schilder is Friday, 11/13 at 9:00a.  Appt will be virtual through WebEx.  Contact information: [place] [phone]          Next level of care provider has access to [place] and Suicide Prevention discussed: Yes,  with pt's father      Has patient been referred to the Quitline?: N/A patient is not a smoker  Patient has been referred for addiction treatment: Yes  Billey Chang, Student-Social Work 10/07/2019, 8:56 AM

## 2019-10-07 NOTE — BHH Suicide Risk Assessment (Signed)
[facility] Discharge Suicide Risk Assessment   Principal Problem: Schizoaffective disorder, bipolar type [facility]) Discharge Diagnoses: Principal Problem:   Schizoaffective disorder, bipolar type (Lisa [name])   Total Time spent with patient: 45 minutes   Mental Status Per Nursing Assessment::   On Admission:  NA  Patient is alert oriented to person place time and situation.  Her affect is appropriate.  Her speech is normal rate and tone.  She has not decompensated or become more manic despite the acuity on the unit and another patient screaming and so forth.  The patient denies auditory or visual hallucinations.  She denies racing thoughts.  She denies thoughts of harming self or others she has finally achieved a euthymic status no delusional thought fully compliant with meds at this point in time safe for discharge  Demographic Factors:  NA  Loss Factors: NA  Historical Factors: NA  Risk Reduction Factors:   Sense of responsibility to family, Religious beliefs about death, Employed, Positive social support and Positive coping skills or problem solving skills  Continued Clinical Symptoms: Currently euthymic  Cognitive Features That Contribute To Risk:  Polarized thinking    Suicide Risk:  Minimal: No identifiable suicidal ideation.  Patients presenting with no risk factors but with morbid ruminations; may be classified as minimal risk based on the severity of the depressive symptoms  Follow-up [facility] Follow up on 10/16/2019.   Specialty: Behavioral Health Why: Medication managemenet with Dr. Adele Schilder is Friday, 11/13 at 9:00a.  Appt will be virtual through WebEx.  Contact information: [name] [phone]          Plan Of Care/Follow-up recommendations:  Activity:  full  [name], MD 10/07/2019, 10:44 AM

## 2019-10-07 NOTE — Discharge Summary (Signed)
Physician Discharge Summary Note  Patient:  Lisa [name] is an 28 y.o., female  MRN:  [id number]  DOB:  1991-04-25  Patient phone:  [phone] (home)   Patient address:   [address] [place] [address],   Total Time spent with patient: Greater than 30 minutes  Date of Admission:  09/14/2019  Date of Discharge: 10/07/19  Reason for Admission: Worsening paranoia, some unusual ideas of reference, believing that her current psychiatrist had been "receiving messages from her & sending them to her".   Principal Problem: Schizoaffective disorder, bipolar type [facility])  Discharge Diagnoses: Principal Problem:   Schizoaffective disorder, bipolar type (HCC)  Past Psychiatric History: Schizoaffective disorder, bipolar-type.                                               Multiple psychiatric hospitalizations & medication trials.  Past Medical History:  Past Medical History:  Diagnosis Date  . Anxiety     Past Surgical History:  Procedure Laterality Date  . extraction of wisdom teeth     Family History:  Family History  Problem Relation Age of Onset  . Schizophrenia Mother   . Depression Father   . Seizures Sister    Family Psychiatric  History: Mother: Schizophrenia.                                                  Father: Major depression.. Social History:  Social History   Substance and Sexual Activity  Alcohol Use Yes   Comment: casually     Social History   Substance and Sexual Activity  Drug Use No  . Types: Marijuana   Comment: THC last used 2 yrs ago, unsure of amount    Social History   Socioeconomic History  . Marital status: Single    Spouse name: Not on file  . Number of children: Not on file  . Years of education: Not on file  . Highest education level: Not on file  Occupational History  . Not on file  Social Needs  . Financial resource strain: Not on file  . Food insecurity    Worry: Not on file    Inability: Not on file  .  Transportation needs    Medical: Not on file    Non-medical: Not on file  Tobacco Use  . Smoking status: Former Smoker    Packs/day: 0.25    Years: 0.50    Pack years: 0.12    Types: Cigarettes    Quit date: 09/03/2015    Years since quitting: 4.0  . Smokeless tobacco: Never Used  Substance and Sexual Activity  . Alcohol use: Yes    Comment: casually  . Drug use: No    Types: Marijuana    Comment: THC last used 2 yrs ago, unsure of amount  . Sexual activity: Not Currently    Comment: Plan B Pill  Lifestyle  . Physical activity    Days per week: Not on file    Minutes per session: Not on file  . Stress: Not on file  Relationships  . Social Musician on phone: Not on file    Gets together: Not on file  Attends religious service: Not on file    Active member of club or organization: Not on file    Attends meetings of clubs or organizations: Not on file    Relationship status: Not on file  Other Topics Concern  . Not on file  Social History Narrative  . Not on file   Hospital Course: (Per Md's admission evaluation): This is the sixth psychiatric admission for Ms. Armentrout, and she was recently here from 9/28 to 10/6-thus she is admitted exactly 1 week after her last discharge. Despite long-acting injectable aripiprazole administered on 09/04/19 at 400 mg IM, as well as lithium and Risperdal at 8 mg at bedtime, the patient presented on 09/14/19 with paranoia, some unusual ideas of reference, believing that her current psychiatrist had been "receiving messages from her, and sending them to her". She also expresses numerous bizarre delusions but none were particularly formed or organized. The piece of truth regarding "messages" is that she did indeed send text messages to her psychiatrist, Dr. Daron Offer, that were indicative of a psychotic thought process, and further she had apparently discontinued her oral medications, and the long-acting injectable simply could not hold her  symptomatology at [facility]. Since on the unit she has refused the Risperdal which had helped her previously she only wants to take oral aripiprazole, she is argumentative about this but alternates between being pleasant and argumentative, she was described as "yelling" at 1:20 AM this morning, 10/13-IM medications were administered and during our interview she insisted no "the doctor's name who ordered my shot" and feels that it was not justified in so many words.  This is one of several psychiatric discharge summaries for this 28 year old female with hx of chronic mental illness & multiple psychiatric admissions. She is known in this [facility] & other psychiatric hospitals within the surrounding areas for worsening symptoms of of her chronic mental illness. She has been tried on multiple psychotropic medications for her symptoms & it appeared nothing has actually been helpful in stabilizing her symptoms. She was brought to the [facility] this time around for evaluation & treatment after worsening paranoia & ideas of reference.  After evaluation of her presenting symptoms this time around, [place] was recommended for mood stabilization treatments. The medication regimen for her presenting symptoms were discussed & with her consent initiated. She received, stabilized & was discharged on the medications as listed below on her discharge medication lists. She was also enrolled & participated in the group counseling sessions being offered & held on this unit. She learned coping skills. She presented on this present admission, other chronic medical conditions that required treatment & monitoring. She was resumed/discharged on all her pertinent home medications for those health issues. She tolerated her treatment regimen without any adverse effects or reactions reported.  And because of the chronic nature of her psychiatric symptoms & their resistance to the treatment regimen, Adiel was treated, stabilized & being discharged on two  separate antipsychotic medications Lorayne Bender Sustenna & Perphenazine tabs). This is because she has not been able to achieve symptoms control under an antipsychotic monotherapy. These two combination antipsychotic therapies seem effective in stabilizing her symptoms at this time. It will benefit patient to continue on these combination antipsychotic therapies as recommended. However, as her symptoms continue to improve, she may then be titrated down to an antipsychotic monotherapy. This is to prevent the chances for the development of metabolic syndrome usually associated with use of multiple antipsychotic regimen. This has to be done within  the proper monitoring, discretion & judgement of her outpatient psychiatric provider.  During the course of her hospitalization, the 15-minute checks were adequate to ensure Lindora's safety.  Patient did not display any dangerous, violent or suicidal behavior on the unit.  She interacted with patients & staff appropriately, participated appropriately in the group sessions/therapies. Her medications were addressed & adjusted to meet her needs. She was recommended for outpatient follow-up care & medication management upon discharge to assure her continuity of care.  At the time of discharge patient is not reporting any acute suicidal/homicidal ideations. She feels more confident about her self-care & in managing her mental health. She currently denies any new issues or concerns. Education and supportive counseling provided throughout her hospital stay & upon discharge.  Today upon her discharge evaluation with the attending psychiatrist, Tekelia shares she is doing well. She denies any other specific concerns. She is sleeping well. Her appetite is good. She denies other physical complaints. She denies AH/VH. She feels that her medications have been helpful & is in agreement to continue her current treatment regimen as recommended. She was able to engage in safety planning  including plan to return to [facility] or contact emergency services if she feels unable to maintain her own safety or the safety of others. Pt had no further questions, comments, or concerns. She left [facility] with all personal belongings in no apparent distress. Transportation per her Benedetto Goad.  Physical Findings: AIMS: Facial and Oral Movements Muscles of Facial Expression: None, normal Lips and Perioral Area: None, normal Jaw: None, normal Tongue: None, normal,Extremity Movements Upper (arms, wrists, hands, fingers): None, normal Lower (legs, knees, ankles, toes): None, normal, Trunk Movements Neck, shoulders, hips: None, normal, Overall Severity Severity of abnormal movements (highest score from questions above): None, normal Incapacitation due to abnormal movements: None, normal Patient's awareness of abnormal movements (rate only patient's report): No Awareness, Dental Status Current problems with teeth and/or dentures?: No Does patient usually wear dentures?: No  CIWA:    COWS:     Musculoskeletal: Strength & Muscle Tone: within normal limits Gait & Station: normal Patient leans: N/A  Psychiatric Specialty Exam: Physical Exam  Nursing note and vitals reviewed. Constitutional: She is oriented to person, place, and time. She appears well-developed and well-nourished.  Neck: Normal range of motion.  Cardiovascular: Normal rate.  Respiratory: Effort normal.  Genitourinary:    Genitourinary Comments: Deferred   Musculoskeletal: Normal range of motion.  Neurological: She is alert and oriented to person, place, and time.  Skin: Skin is warm.    Review of Systems  Constitutional: Negative.  Negative for chills and fever.  Respiratory: Negative for cough, shortness of breath and wheezing.   Cardiovascular: Negative for chest pain and palpitations.  Gastrointestinal: Negative for heartburn, nausea and vomiting.  Neurological: Negative for dizziness and headaches.  Psychiatric/Behavioral:  Positive for depression (Stabilized with medication prior to discharge) and hallucinations (Hx. Psychosis (Stabilized with medication prior to discharge)). Negative for memory loss, substance abuse and suicidal ideas. The patient has insomnia (Stabilized with medication prior to discharge). The patient is not nervous/anxious (Stable).     Blood pressure 117/80, pulse 90, temperature 97.7 F (36.5 C), temperature source Oral, resp. rate 18, SpO2 98 %.There is no height or weight on file to calculate BMI.  See MD's discharge SRA   Has this patient used any form of tobacco in the last 30 days? (Cigarettes, Smokeless Tobacco, Cigars, and/or Pipes):  Yes, an FDA-approved tobacco cessation medication was offered at  discharge.  Blood Alcohol level:  Lab Results  Component Value Date   ETH <10 09/15/2019   ETH <10 08/31/2019    Metabolic Disorder Labs:  Lab Results  Component Value Date   HGBA1C 5.3 09/15/2019   MPG 105.41 09/15/2019   MPG 103 05/14/2015   Lab Results  Component Value Date   PROLACTIN 16.5 09/15/2019   PROLACTIN 18.6 06/10/2015   Lab Results  Component Value Date   CHOL 210 (H) 09/15/2019   TRIG 97 09/15/2019   HDL 60 09/15/2019   CHOLHDL 3.5 09/15/2019   VLDL 19 09/15/2019   LDLCALC 131 (H) 09/15/2019   LDLCALC 77 05/14/2015   See Psychiatric Specialty Exam and Suicide Risk Assessment completed by Attending Physician prior to discharge.  Discharge destination:  Home  Is patient on multiple antipsychotic therapies at discharge:Yes,   Do you recommend tapering to monotherapy for antipsychotics?  Yes   Has Patient had three or more failed trials of antipsychotic monotherapy by history:  Yes,   Antipsychotic medications that previously failed include:   1.  Abilify ., 2.  Seroquel. and 3.  Zyprexa.  Recommended Plan for Multiple Antipsychotic Therapies: Additional reason(s) for multiple antispychotic treatment:  This patient has not been able to achieve symptoms  control under an antipsychotic monotherapy. However, the outpatient psychiatric provider may taper patient to monotherapy as her symptoms improve to prevent the development of metabolic syndrome associated with use of multiple antipsychotic therapies.    Allergies as of 10/07/2019      Reactions   Artane [trihexyphenidyl] Anaphylaxis   Invega [paliperidone Er] Other (See Comments)   eps and prolactin increase    Ambien [zolpidem] Other (See Comments)   Sleep walking   Other Other (See Comments)   Nuts - food poisoning effect, makes sick on stomach       Medication List    STOP taking these medications   ARIPiprazole 10 MG tablet Commonly known as: ABILIFY   ARIPiprazole ER 400 MG Srer injection Commonly known as: ABILIFY MAINTENA   benztropine 1 MG tablet Commonly known as: COGENTIN   lithium carbonate 450 MG CR tablet Commonly known as: ESKALITH   LORazepam 2 MG tablet Commonly known as: ATIVAN   omega-3 acid ethyl esters 1 g capsule Commonly known as: LOVAZA   risperidone 4 MG tablet Commonly known as: RISPERDAL     TAKE these medications     Indication  carbamazepine 200 MG tablet Commonly known as: TEGRETOL Take 1 tablet (200 mg total) by mouth 3 (three) times daily. For mood stabilization  Indication: Mood stabilization   carvedilol 12.5 MG tablet Commonly known as: COREG Take 1 tablet (12.5 mg total) by mouth 2 (two) times daily with a meal.  Indication: High Blood Pressure Disorder   hydrOXYzine 50 MG tablet Commonly known as: ATARAX/VISTARIL Take 1 tablet (50 mg total) by mouth at bedtime. For anxiety  Indication: Feeling Anxious   levonorgestrel-ethinyl estradiol 0.15-0.03 MG tablet Commonly known as: SEASONALE Take 1 tablet by mouth daily.  Indication: Birth Control Treatment   neomycin-bacitracin-polymyxin Oint Commonly known as: NEOSPORIN Apply 1 application topically as needed for wound care.  Indication: Wound care   nicotine polacrilex 2  MG gum Commonly known as: NICORETTE Take 1 each (2 mg total) by mouth as needed. (May buy from over the counter): For smoking cessation  Indication: Nicotine Addiction   paliperidone 234 MG/1.5ML Susy injection Commonly known as: INVEGA SUSTENNA Inject 234 mg into the muscle every 28 (  twenty-eight) days. (11-30-19): For mood control Start taking on: November 30, 2019  Indication: Mood control   perphenazine 16 MG tablet Commonly known as: TRILAFON Take 1 tablet (16 mg total) by mouth 2 (two) times daily. For mood control  Indication: Mood control   temazepam 30 MG capsule Commonly known as: RESTORIL Take 1 capsule (30 mg total) by mouth at bedtime as needed for sleep. What changed:   when to take this  reasons to take this  Indication: Trouble Sleeping      Follow-up Information    [facility] Follow up on 10/16/2019.   Specialty: Behavioral Health Why: Medication managemenet with Dr. Lolly Mustache is Friday, 11/13 at 9:00a.  Appt will be virtual through WebEx.  Contact information: [address] [place] [address] [phone]         Follow-up recommendations: Activity:  As tolerated Diet: As recommended by your primary care doctor. Keep all scheduled follow-up appointments as recommended.  Comments: Prescriptions given at discharge.  Patient agreeable to plan.  Given opportunity to ask questions.  Appears to feel comfortable with discharge denies any current suicidal or homicidal thought. Patient is also instructed prior to discharge to: Take all medications as prescribed by his/her mental healthcare provider. Report any adverse effects and or reactions from the medicines to his/her outpatient provider promptly. Patient has been instructed & cautioned: To not engage in alcohol and or illegal drug use while on prescription medicines. In the event of worsening symptoms, patient is instructed to call the crisis  hotline, 911 and or go to the nearest ED for appropriate evaluation and treatment of symptoms. To follow-up with his/her primary care provider for your other medical issues, concerns and or health care needs.   Signed: Armandina Stammer, NP, PMHNP, FNP-BC. 10/07/2019, 11:16 AM

## 2019-10-07 NOTE — Progress Notes (Signed)
Patient ID: Lisa [name], female   DOB: 1991/05/28, 28 y.o.   MRN: [id number] Patient discharged to home/self care on her own accord.  Patient acknowledged understanding of all discharge instructions and receipt of personal belongings.  Patient eager to discharge.

## 2019-10-16 ENCOUNTER — Ambulatory Visit (INDEPENDENT_AMBULATORY_CARE_PROVIDER_SITE_OTHER): Payer: Commercial Managed Care - PPO | Admitting: Psychiatry

## 2019-10-16 ENCOUNTER — Other Ambulatory Visit: Payer: Self-pay

## 2019-10-16 ENCOUNTER — Other Ambulatory Visit (HOSPITAL_COMMUNITY): Payer: Self-pay | Admitting: Psychiatry

## 2019-10-16 ENCOUNTER — Encounter (HOSPITAL_COMMUNITY): Payer: Self-pay | Admitting: Psychiatry

## 2019-10-16 VITALS — Wt 170.0 lb

## 2019-10-16 DIAGNOSIS — F319 Bipolar disorder, unspecified: Secondary | ICD-10-CM

## 2019-10-16 DIAGNOSIS — F259 Schizoaffective disorder, unspecified: Secondary | ICD-10-CM

## 2019-10-16 DIAGNOSIS — F411 Generalized anxiety disorder: Secondary | ICD-10-CM | POA: Diagnosis not present

## 2019-10-16 MED ORDER — CARBAMAZEPINE 200 MG PO TABS: 200.0000 mg | ORAL_TABLET | Freq: Three times a day (TID) | ORAL | 0 refills | Status: DC

## 2019-10-16 MED ORDER — PERPHENAZINE 16 MG PO TABS: 16.0000 mg | ORAL_TABLET | Freq: Two times a day (BID) | ORAL | 0 refills | Status: DC

## 2019-10-16 MED ORDER — HYDROXYZINE HCL 50 MG PO TABS: 50.0000 mg | ORAL_TABLET | Freq: Every day | ORAL | 0 refills | Status: DC

## 2019-10-16 MED ORDER — PALIPERIDONE PALMITATE ER 234 MG/1.5ML IM SUSY: 234.0000 mg | PREFILLED_SYRINGE | INTRAMUSCULAR | 0 refills | Status: DC

## 2019-10-16 NOTE — Progress Notes (Signed)
Virtual Visit via Video Note  I connected with Lisa [name] on 10/16/19 at  9:00 AM EST by a video enabled telemedicine application and verified that I am speaking with the correct person using two identifiers.   I discussed the limitations of evaluation and management by telemedicine and the availability of in person appointments. The patient expressed understanding and agreed to proceed.    [facility] Initial Assessment Note  Lisa [name] [id number] 28 y.o.  10/16/2019 9:54 AM  Chief Complaint:  I was admitted in the hospital and they asked me to follow-up with you.  History of Present Illness:  Lisa [name] is a 28 year old Caucasian single, employed female who is referred from inpatient services for the management of her psychiatric illness.  This is her sixth inpatient and back-to-back in recent months.  Patient has a history of bipolar disorder and schizoaffective disorder.  She was seeing Dr. Rene Kocher since June this year and before she was seeing Dr. Jannifer Franklin since 2016.  She was not happy with Dr. Jannifer Franklin and decided to move her care to Dr. Rene Kocher.  However she admitted poorly compliant with medication because she felt that she does not needed and she wanted to come off of the medication so she can get pregnant in the future.  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  Patient was discharge on multiple medication.  Patient admitted now she understand that she need to take the medication to keep her mental health stable.  She admitted history of paranoia, delusion, psychosis but denies any auditory or visual hallucination or suicidal attempt.  In past few years she had tried multiple medication including Risperdal, Haldol, Zyprexa, Latuda, lithium, Geodon, Depakote, Abilify, Seroquel, lorazepam, temazepam and hydroxyzine.  She reported side effects from these medication and admitted lately she is stopped the Abilify overall because she  felt she does not need it.  Patient was admitted in September and given Abilify injection and oral Abilify but before her second injection she relapsed and admitted in October.  She was discharged on November 5.  Now she is on Invega, Tegretol, temazepam, Trilafon and hydroxyzine.  She feels the combination is working very well and she is hoping to continue these medication because she cannot afford going back again hospital.  She admitted in the beginning have difficulty accepting her illness and she thought that she is suffering from MS.  In September she saw Dr. Jacqlyn Krauss at [facility] neurology and she had work-up to rule out MS.  Patient is still have struggle accepting her diagnosis schizoaffective but agree that she may have bipolar disorder.  She also believes her symptoms started when she went to [place] with her ex-boyfriend and took some street drugs and admitted in the [place]  Her ex boyfriend's mother went [place] to bring her back and she was admitted at [facility] the hospital for more than a month.  Patient recall her symptoms are usually delusions, paranoia, out of reality and disorganized.  During her last hospitalization she was refusing medication and she was argumentative and remained in the hospital for more than 3 weeks.  Patient admitted occasional drinking but denies any hard cord drug use.  She lives by herself.  She has good support from her sister.  One of her sister lives in [place], 1 lives in [place] and parents lives in [place].  She admitted some weight gain while she was in the hospital and now she is focusing to lose  some weight.  She understand that she is taking 2 antipsychotic medication, one mood stabilizer and to antianxiety medication.  She takes mostly hydroxyzine at bedtime but occasionally she need temazepam to help her sleep.  She reported her thinking is much clearer and she is close to reality and understand that she need to take the medication to keep her  stable.  At this time getting pregnant is not her priority.  She is sleeping at least 7 to 8 hours.  She will also endorsed some time anxiety, nightmares mostly about stay in the [place].  She denies any history of physical sexual or verbal abuse.  She denies any history of poor impulse control, impulsive buying, road rage but admitted she has some anger issues.  She denies any issues with the law.  She denies any history of head trauma.  She denies any history of seizures or headaches.  She was recently found that she has hypertension and given carvedilol to stabilize her blood pressure.  She has not seen her primary care physician Kathyrn Lass at [place] in a while.  At this time patient did not report any side effects of the medication.  She preferred to continue current medication since she is feeling better.  We also talked about scheduling therapy appointment for coping skills.  Patient lives in [place] and preferred to have a therapist in that area.  Past Psychiatric History; History of multiple inpatient started at age 4 when she was visiting [place] with her ex-boyfriend and took street drugs.  She was admitted in [place] for many days.  Upon arrival back to [place] she was soon admitted at [facility] where she stayed for more than a month.  No history of suicidal attempt but history of psychosis, paranoia, bizarre delusion and thinking.  History of anger but no history of impulsive behavior.  Last admission October 2020 at [facility].  Saw Dr. Darleene Cleaver and then Dr. Daron Offer briefly.  Tried lithium, Risperdal, Haldol, Zyprexa, Geodon, Depakote, Ativan, Latuda, Seroquel and Lamictal.  She recalled most of the medication causes side effects.  No history of suicidal attempt.  No history of abuse.  Family History; Mother has schizophrenia.  Medical History; Hypertension.  Traumatic brain injury: Denies any history of traumatic brain injury or seizures.  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  Psychosocial History; Patient never married and she has no children.  She has a sister who lives in [place] and other sister in [place].  Her parents lives in [place].  Patient lives by herself.  Legal History; Denies any history of legal issues.  History Of Abuse; Denies any history of abuse.  Substance Abuse History; Admitted history of drug use in the [place].  Admitted history of occasional drinking but denies any intoxication, blackouts or current use of heavy drugs.  Neurologic: Headache: No Seizure: No Paresthesias: No   Outpatient Encounter Medications as of 10/16/2019  Medication Sig  . carbamazepine (TEGRETOL) 200 MG tablet Take 1 tablet (200 mg total) by mouth 3 (three) times daily. For mood stabilization  . carvedilol (COREG) 12.5 MG tablet Take 1 tablet (12.5 mg total) by mouth 2 (two) times daily with a meal.  . hydrOXYzine (ATARAX/VISTARIL) 50 MG tablet Take 1 tablet (50 mg total) by mouth at bedtime. For anxiety  . levonorgestrel-ethinyl estradiol (SEASONALE) 0.15-0.03 MG tablet Take 1 tablet by mouth daily.  [name] neomycin-bacitracin-polymyxin (NEOSPORIN) OINT Apply 1 application topically  as needed for wound care.  . nicotine polacrilex (NICORETTE) 2 MG gum Take 1 each (2 mg total) by mouth as needed. (May buy from over the counter): For smoking cessation  . [START ON 11/30/2019] paliperidone (INVEGA SUSTENNA) 234 MG/1.5ML SUSY injection Inject 234 mg into the muscle every 28 (twenty-eight) days. (11-30-19): For mood control  . perphenazine (TRILAFON) 16 MG tablet Take 1 tablet (16 mg total) by mouth 2 (two) times daily. For mood control  . temazepam (RESTORIL) 30 MG capsule Take 1 capsule (30 mg total) by mouth at bedtime as needed for sleep.   No facility-administered encounter medications on file as of 10/16/2019.     Recent Results (from the past 2160 hour(s))   Vitamin B12     Status: None   Collection Time: 08/20/19  9:12 AM  Result Value Ref Range   Vitamin B-12 269 232 - 1,245 pg/mL  HIV Antibody (routine testing w rflx)     Status: None   Collection Time: 08/20/19  9:12 AM  Result Value Ref Range   HIV Screen 4th Generation wRfx Non Reactive Non Reactive  Sedimentation rate     Status: None   Collection Time: 08/20/19  9:12 AM  Result Value Ref Range   Sed Rate 2 0 - 32 mm/hr  Angiotensin converting enzyme     Status: None   Collection Time: 08/20/19  9:12 AM  Result Value Ref Range   Angio Convert Enzyme 26 14 - 82 U/L  ANA w/Reflex     Status: None   Collection Time: 08/20/19  9:12 AM  Result Value Ref Range   Anti Nuclear Antibody (ANA) Negative Negative  B. burgdorfi antibodies     Status: None   Collection Time: 08/20/19  9:12 AM  Result Value Ref Range   Lyme IgG/IgM Ab <0.91 0.00 - 0.90 ISR    Comment:                                 Negative         <0.91                                 Equivocal  0.91 - 1.09                                 Positive         >1.09   Comprehensive metabolic panel     Status: Abnormal   Collection Time: 08/20/19  9:12 AM  Result Value Ref Range   Glucose 84 65 - 99 mg/dL   BUN 9 6 - 20 mg/dL   Creatinine, Ser 1.61 0.57 - 1.00 mg/dL   GFR calc non Af Amer 83 >59 mL/min/1.73   GFR calc Af Amer 96 >59 mL/min/1.73   BUN/Creatinine Ratio 9 9 - 23   Sodium 139 134 - 144 mmol/L   Potassium 4.5 3.5 - 5.2 mmol/L   Chloride 103 96 - 106 mmol/L   CO2 22 20 - 29 mmol/L   Calcium 9.6 8.7 - 10.2 mg/dL   Total Protein 7.4 6.0 - 8.5 g/dL   Albumin 5.1 (H) 3.9 - 5.0 g/dL   Globulin, Total 2.3 1.5 - 4.5 g/dL   Albumin/Globulin Ratio 2.2 1.2 - 2.2   Bilirubin Total 0.5  0.0 - 1.2 mg/dL   Alkaline Phosphatase 48 39 - 117 IU/L   AST 18 0 - 40 IU/L   ALT 14 0 - 32 IU/L  Comprehensive metabolic panel     Status: Abnormal   Collection Time: 08/31/19 12:41 AM  Result Value Ref Range   Sodium 138 135 -  145 mmol/L   Potassium 3.4 (L) 3.5 - 5.1 mmol/L   Chloride 104 98 - 111 mmol/L   CO2 18 (L) 22 - 32 mmol/L   Glucose, Bld 125 (H) 70 - 99 mg/dL   BUN 13 6 - 20 mg/dL   Creatinine, Ser 7.09 0.44 - 1.00 mg/dL   Calcium 9.4 8.9 - 62.8 mg/dL   Total Protein 7.7 6.5 - 8.1 g/dL   Albumin 4.9 3.5 - 5.0 g/dL   AST 37 15 - 41 U/L   ALT 33 0 - 44 U/L   Alkaline Phosphatase 45 38 - 126 U/L   Total Bilirubin 1.0 0.3 - 1.2 mg/dL   GFR calc non Af Amer >60 >60 mL/min   GFR calc Af Amer >60 >60 mL/min   Anion gap 16 (H) 5 - 15    Comment: Performed at [facility], [address] [place], [place] [address]  Ethanol     Status: None   Collection Time: 08/31/19 12:41 AM  Result Value Ref Range   Alcohol, Ethyl (B) <10 <10 mg/dL    Comment: (NOTE) Lowest detectable limit for serum alcohol is 10 mg/dL. For medical purposes only. Performed at [facility], [address], [place], [place] [address]   Salicylate level     Status: None   Collection Time: 08/31/19 12:41 AM  Result Value Ref Range   Salicylate Lvl <7.0 2.8 - 30.0 mg/dL    Comment: Performed at [facility], [address], [place], [place] [address]  Acetaminophen level     Status: Abnormal   Collection Time: 08/31/19 12:41 AM  Result Value Ref Range   Acetaminophen (Tylenol), Serum <10 (L) 10 - 30 ug/mL    Comment: (NOTE) Therapeutic concentrations vary significantly. A range of 10-30 ug/mL  may be an effective concentration for many patients. However, some  are best treated at concentrations outside of this range. Acetaminophen concentrations >150 ug/mL at 4 hours after ingestion  and >50 ug/mL at 12 hours after ingestion are often associated with  toxic reactions. Performed at [facility], [address] [place], [place] [address]   cbc     Status: Abnormal   Collection Time: 08/31/19 12:41 AM  Result Value Ref Range   WBC 12.4 (H) 4.0 - 10.5  K/uL   RBC 5.24 (H) 3.87 - 5.11 MIL/uL   Hemoglobin 16.4 (H) 12.0 - 15.0 g/dL   HCT 12.7 (H) 51.7 - 00.1 %   MCV 91.2 80.0 - 100.0 fL   MCH 31.3 26.0 - 34.0 pg   MCHC 34.3 30.0 - 36.0 g/dL   RDW 74.9 44.9 - 67.5 %   Platelets 305 150 - 400 K/uL   nRBC 0.0 0.0 - 0.2 %    Comment: Performed at [facility], [address] [place], [place] [address]  Rapid urine drug screen (hospital performed)     Status: None   Collection Time: 08/31/19 12:41 AM  Result Value Ref Range   Opiates NONE DETECTED NONE DETECTED   Cocaine NONE DETECTED NONE DETECTED   Benzodiazepines NONE DETECTED NONE DETECTED  Amphetamines NONE DETECTED NONE DETECTED   Tetrahydrocannabinol NONE DETECTED NONE DETECTED   Barbiturates NONE DETECTED NONE DETECTED    Comment: (NOTE) DRUG SCREEN FOR MEDICAL PURPOSES ONLY.  IF CONFIRMATION IS NEEDED FOR ANY PURPOSE, NOTIFY LAB WITHIN 5 DAYS. LOWEST DETECTABLE LIMITS FOR URINE DRUG SCREEN Drug Class                     Cutoff (ng/mL) Amphetamine and metabolites    1000 Barbiturate and metabolites    200 Benzodiazepine                 200 Tricyclics and metabolites     300 Opiates and metabolites        300 Cocaine and metabolites        300 THC                            50 Performed at [facility], [address] [place], [place] [address]   CK     Status: Abnormal   Collection Time: 08/31/19 12:41 AM  Result Value Ref Range   Total CK 387 (H) 38 - 234 U/L    Comment: Performed at [facility], [address], [place], [place] [address]  I-Stat beta hCG blood, ED     Status: None   Collection Time: 08/31/19 12:51 AM  Result Value Ref Range   I-stat hCG, quantitative <5.0 <5 mIU/mL   Comment 3            Comment:   GEST. AGE      CONC.  (mIU/mL)   <=1 WEEK        5 - 50     2 WEEKS       50 - 500     3 WEEKS       100 - 10,000     4 WEEKS     1,000 - 30,000        FEMALE AND NON-PREGNANT FEMALE:      LESS THAN 5 mIU/mL   SARS Coronavirus 2 [facility] order, Performed in [facility] hospital lab) Nasopharyngeal Nasopharyngeal Swab     Status: None   Collection Time: 08/31/19  2:39 PM   Specimen: Nasopharyngeal Swab  Result Value Ref Range   SARS Coronavirus 2 NEGATIVE NEGATIVE    Comment: (NOTE) If result is NEGATIVE SARS-CoV-2 target nucleic acids are NOT DETECTED. The SARS-CoV-2 RNA is generally detectable in upper and lower  respiratory specimens during the acute phase of infection. The lowest  concentration of SARS-CoV-2 viral copies this assay can detect is 250  copies / mL. A negative result does not preclude SARS-CoV-2 infection  and should not be used as the sole basis for treatment or other  patient management decisions.  A negative result may occur with  improper specimen collection / handling, submission of specimen other  than nasopharyngeal swab, presence of viral mutation(s) within the  areas targeted by this assay, and inadequate number of viral copies  (<250 copies / mL). A negative result must be combined with clinical  observations, patient history, and epidemiological information. If result is POSITIVE SARS-CoV-2 target nucleic acids are DETECTED. The SARS-CoV-2 RNA is generally detectable in upper and lower  respiratory specimens dur ing the acute phase of infection.  Positive  results are indicative of active infection with SARS-CoV-2.  Clinical  correlation with patient history and other diagnostic information  is  necessary to determine patient infection status.  Positive results do  not rule out bacterial infection or co-infection with other viruses. If result is PRESUMPTIVE POSTIVE SARS-CoV-2 nucleic acids MAY BE PRESENT.   A presumptive positive result was obtained on the submitted specimen  and confirmed on repeat testing.  While 2019 novel coronavirus  (SARS-CoV-2) nucleic acids may be present in the submitted sample  additional confirmatory testing may  be necessary for epidemiological  and / or clinical management purposes  to differentiate between  SARS-CoV-2 and other Sarbecovirus currently known to infect humans.  If clinically indicated additional testing with an alternate test  methodology [phone]) is advised. The SARS-CoV-2 RNA is generally  detectable in upper and lower respiratory sp ecimens during the acute  phase of infection. The expected result is Negative. Fact Sheet for Patients:  BoilerBrush.com.cy Fact Sheet for Healthcare Providers: https://pope.com/ This test is not yet approved or cleared by the [place] FDA and has been authorized for detection and/or diagnosis of SARS-CoV-2 by FDA under an Emergency Use Authorization (EUA).  This EUA will remain in effect (meaning this test can be used) for the duration of the COVID-19 declaration under Section 564(b)(1) of the Act, 21 U.S.C. section 360bbb-3(b)(1), unless the authorization is terminated or revoked sooner. Performed at [facility], [address], [place], [place] [address]   Lithium level     Status: None   Collection Time: 09/07/19  6:36 AM  Result Value Ref Range   Lithium Lvl 0.63 0.60 - 1.20 mmol/L    Comment: Performed at [facility], [address], [place], [place] [address]  CBC     Status: Abnormal   Collection Time: 09/07/19  6:36 AM  Result Value Ref Range   WBC 8.4 4.0 - 10.5 K/uL   RBC 4.94 3.87 - 5.11 MIL/uL   Hemoglobin 15.3 (H) 12.0 - 15.0 g/dL   HCT 47.8 (H) 29.5 - 62.1 %   MCV 93.7 80.0 - 100.0 fL   MCH 31.0 26.0 - 34.0 pg   MCHC 33.0 30.0 - 36.0 g/dL   RDW 30.8 65.7 - 84.6 %   Platelets 305 150 - 400 K/uL   nRBC 0.0 0.0 - 0.2 %    Comment: Performed at [facility], [address] [place], [place] [address]  SARS Coronavirus 2 by RT PCR (hospital order, performed in [facility] hospital lab) Nasopharyngeal Nasopharyngeal  Swab     Status: None   Collection Time: 09/14/19 12:11 PM   Specimen: Nasopharyngeal Swab  Result Value Ref Range   SARS Coronavirus 2 NEGATIVE NEGATIVE    Comment: (NOTE) If result is NEGATIVE SARS-CoV-2 target nucleic acids are NOT DETECTED. The SARS-CoV-2 RNA is generally detectable in upper and lower  respiratory specimens during the acute phase of infection. The lowest  concentration of SARS-CoV-2 viral copies this assay can detect is 250  copies / mL. A negative result does not preclude SARS-CoV-2 infection  and should not be used as the sole basis for treatment or other  patient management decisions.  A negative result may occur with  improper specimen collection / handling, submission of specimen other  than nasopharyngeal swab, presence of viral mutation(s) within the  areas targeted by this assay, and inadequate number of viral copies  (<250 copies / mL). A negative result must be combined with clinical  observations, patient history, and epidemiological information. If result is POSITIVE SARS-CoV-2 target nucleic acids are DETECTED.  The SARS-CoV-2 RNA is generally detectable in upper and lower  respiratory specimens dur ing the acute phase of infection.  Positive  results are indicative of active infection with SARS-CoV-2.  Clinical  correlation with patient history and other diagnostic information is  necessary to determine patient infection status.  Positive results do  not rule out bacterial infection or co-infection with other viruses. If result is PRESUMPTIVE POSTIVE SARS-CoV-2 nucleic acids MAY BE PRESENT.   A presumptive positive result was obtained on the submitted specimen  and confirmed on repeat testing.  While 2019 novel coronavirus  (SARS-CoV-2) nucleic acids may be present in the submitted sample  additional confirmatory testing may be necessary for epidemiological  and / or clinical management purposes  to differentiate between  SARS-CoV-2 and other  Sarbecovirus currently known to infect humans.  If clinically indicated additional testing with an alternate test  methodology [phone]) is advised. The SARS-CoV-2 RNA is generally  detectable in upper and lower respiratory sp ecimens during the acute  phase of infection. The expected result is Negative. Fact Sheet for Patients:  BoilerBrush.com.cy Fact Sheet for Healthcare Providers: https://pope.com/ This test is not yet approved or cleared by the [place] FDA and has been authorized for detection and/or diagnosis of SARS-CoV-2 by FDA under an Emergency Use Authorization (EUA).  This EUA will remain in effect (meaning this test can be used) for the duration of the COVID-19 declaration under Section 564(b)(1) of the Act, 21 U.S.C. section 360bbb-3(b)(1), unless the authorization is terminated or revoked sooner. Performed at [facility], [address], [place], [place] [address]   CBC     Status: Abnormal   Collection Time: 09/15/19  6:24 AM  Result Value Ref Range   WBC 8.7 4.0 - 10.5 K/uL   RBC 4.92 3.87 - 5.11 MIL/uL   Hemoglobin 15.1 (H) 12.0 - 15.0 g/dL   HCT 81.1 91.4 - 78.2 %   MCV 92.7 80.0 - 100.0 fL   MCH 30.7 26.0 - 34.0 pg   MCHC 33.1 30.0 - 36.0 g/dL   RDW 95.6 21.3 - 08.6 %   Platelets 309 150 - 400 K/uL   nRBC 0.0 0.0 - 0.2 %    Comment: Performed at [facility], [address] [place], [place] [address]  Comprehensive metabolic panel     Status: Abnormal   Collection Time: 09/15/19  6:24 AM  Result Value Ref Range   Sodium 138 135 - 145 mmol/L   Potassium 3.9 3.5 - 5.1 mmol/L   Chloride 101 98 - 111 mmol/L   CO2 25 22 - 32 mmol/L   Glucose, Bld 90 70 - 99 mg/dL   BUN 9 6 - 20 mg/dL   Creatinine, Ser 9.62 0.44 - 1.00 mg/dL   Calcium 9.2 8.9 - 95.2 mg/dL   Total Protein 7.0 6.5 - 8.1 g/dL   Albumin 4.1 3.5 - 5.0 g/dL   AST 841 (H) 15 - 41 U/L   ALT 75 (H) 0 - 44  U/L   Alkaline Phosphatase 60 38 - 126 U/L   Total Bilirubin 0.8 0.3 - 1.2 mg/dL   GFR calc non Af Amer >60 >60 mL/min   GFR calc Af Amer >60 >60 mL/min   Anion gap 12 5 - 15    Comment: Performed at [facility], [address] [place], [place] [address]  Ethanol     Status: None   Collection Time: 09/15/19  6:24 AM  Result Value Ref Range   Alcohol, Ethyl (B) <10 <10 mg/dL    Comment: (NOTE) Lowest detectable limit for serum alcohol is 10 mg/dL. For medical purposes only. Performed at [facility], [address], [place], [place] [address]   Hemoglobin A1c     Status: None   Collection Time: 09/15/19  6:24 AM  Result Value Ref Range   Hgb A1c MFr Bld 5.3 4.8 - 5.6 %    Comment: (NOTE) Pre diabetes:          5.7%-6.4% Diabetes:              >6.4% Glycemic control for   <7.0% adults with diabetes    Mean Plasma Glucose 105.41 mg/dL    Comment: Performed at [facility], [address], [place], [place] [address]  Lipid panel     Status: Abnormal   Collection Time: 09/15/19  6:24 AM  Result Value Ref Range   Cholesterol 210 (H) 0 - 200 mg/dL   Triglycerides 97 <098 mg/dL   HDL 60 >11 mg/dL   Total CHOL/HDL Ratio 3.5 RATIO   VLDL 19 0 - 40 mg/dL   LDL Cholesterol 914 (H) 0 - 99 mg/dL    Comment:        Total Cholesterol/HDL:CHD Risk Coronary Heart Disease Risk Table                     Men   Women  1/2 Average Risk   3.4   3.3  Average Risk       5.0   4.4  2 X Average Risk   9.6   7.1  3 X Average Risk  23.4   11.0        Use the calculated Patient Ratio above and the CHD Risk Table to determine the patient's CHD Risk.        ATP III CLASSIFICATION (LDL):  <100     mg/dL   Optimal  782-956  mg/dL   Near or Above                    Optimal  130-159  mg/dL   Borderline  213-086  mg/dL   High  >578     mg/dL   Very High Performed at [facility], [address], [place], [place] [address]    Prolactin     Status: None   Collection Time: 09/15/19  6:24 AM  Result Value Ref Range   Prolactin 16.5 4.8 - 23.3 ng/mL    Comment: (NOTE) Performed At: [facility] [address] [place], [place] [id number] Jolene Schimke MD MW:[id number]   TSH     Status: None   Collection Time: 09/15/19  6:24 AM  Result Value Ref Range   TSH 1.156 0.350 - 4.500 uIU/mL    Comment: Performed by a 3rd Generation assay with a functional sensitivity of <=0.01 uIU/mL. Performed at [facility], [address] [place], [place] [address]   Lithium level     Status: Abnormal   Collection Time: 09/18/19  6:24 AM  Result Value Ref Range   Lithium Lvl 0.45 (L) 0.60 - 1.20 mmol/L    Comment: Performed at [facility], [address] [place], [place] [address]  Comprehensive metabolic panel     Status: Abnormal   Collection Time: 09/27/19  6:26 PM  Result Value Ref Range   Sodium 139 135 -  145 mmol/L   Potassium 4.3 3.5 - 5.1 mmol/L   Chloride 107 98 - 111 mmol/L   CO2 24 22 - 32 mmol/L   Glucose, Bld 69 (L) 70 - 99 mg/dL   BUN 10 6 - 20 mg/dL   Creatinine, Ser 1.610.89 0.44 - 1.00 mg/dL   Calcium 8.8 (L) 8.9 - 10.3 mg/dL   Total Protein 6.9 6.5 - 8.1 g/dL   Albumin 3.8 3.5 - 5.0 g/dL   AST 35 15 - 41 U/L   ALT 32 0 - 44 U/L   Alkaline Phosphatase 59 38 - 126 U/L   Total Bilirubin 0.6 0.3 - 1.2 mg/dL   GFR calc non Af Amer >60 >60 mL/min   GFR calc Af Amer >60 >60 mL/min   Anion gap 8 5 - 15    Comment: Performed at [facility], [address] [place], [place] [address]  Pregnancy, urine     Status: None   Collection Time: 10/04/19  8:36 AM  Result Value Ref Range   Preg Test, Ur NEGATIVE NEGATIVE    Comment:        THE SENSITIVITY OF THIS METHODOLOGY IS >20 mIU/mL. Performed at [facility], [address] [place], [place] [address]   Lithium level     Status: Abnormal   Collection Time: 10/04/19  6:05  PM  Result Value Ref Range   Lithium Lvl 0.59 (L) 0.60 - 1.20 mmol/L    Comment: Performed at [facility], [address] [place], [place] [address]      Constitutional:  There were no vitals taken for this visit.   Musculoskeletal: Strength & Muscle Tone: within normal limits Gait & Station: normal Patient leans: N/A  Psychiatric Specialty Exam: Physical Exam  ROS  There were no vitals taken for this visit.There is no height or weight on file to calculate BMI.  General Appearance: Casual  Eye Contact:  Fair  Speech:  Clear and Coherent and Normal Rate  Volume:  Normal  Mood:  Anxious  Affect:  Congruent  Thought Process:  Goal Directed  Orientation:  Full (Time, Place, and Person)  Thought Content:  Rumination  Suicidal Thoughts:  No  Homicidal Thoughts:  No  Memory:  Immediate;   Fair Recent;   Good Remote;   Good  Judgement:  Fair  Insight:  Present  Psychomotor Activity:  Normal  Concentration:  Concentration: Fair and Attention Span: Fair  Recall:  Good  Fund of Knowledge:  Good  Language:  Good  Akathisia:  No  Handed:  Right  AIMS (if indicated):     Assets:  Communication Skills Desire for Improvement Housing Resilience Social Support Talents/Skills Transportation Vocational/Educational  ADL's:  Intact  Cognition:  WNL  Sleep:   good      Assessment and Plan: Arman FilterCorina Nestler is a 28 year old Caucasian, single, employed female with significant history of mental disorder and multiple inpatient.  She was hospitalized back to back last 2 months when she stopped taking the oral Abilify.  Now she is feeling better on her current medication.  I discussed in length about her need of medication, risk of noncompliance to medication, understanding her psychiatric symptoms and illness.  Patient understand that she has mental illness and she need to take the medication.  We discussed her priorities as patient at some point like to cut down or  come off from the medication so she can get pregnant.  We discussed long-term  consequences of not taking medication.  We also discussed medication side effects and length.  Currently she is taking 2 antipsychotic medication which are telephone 16 mg twice a day, Invega injection to 34 mg intramuscular every 28 days, hydroxyzine 50 mg at bedtime and temazepam 30 mg as needed for insomnia.  She was also started on carvedilol 12.5 mg twice a day in the hospital.  She like to continue current medication since it is working.  She will need a 30-day prescription for her blood pressure until she schedule appointment and see her primary care physician.  Her next injection due is around December 5.  She is out of work until December 21.  I suggested that she should see a therapist for coping skills and she agreed to schedule appointment in our [place] office.  We will also do blood work including Tegretol level since it was not done on her last admission.  We will also get records from Dr. Unice Bailey office and Dr. Gloris Manchester office.  We discussed safety concern that anytime having active suicidal thoughts or homicidal thought that she need to call [phone] one of the local emergency room.  Follow-up in 3 weeks.  Time spent 55 minutes.  Follow Up Instructions:    I discussed the assessment and treatment plan with the patient. The patient was provided an opportunity to ask questions and all were answered. The patient agreed with the plan and demonstrated an understanding of the instructions.   The patient was advised to call back or seek an in-person evaluation if the symptoms worsen or if the condition fails to improve as anticipated.  I provided 55 minutes of non-face-to-face time during this encounter.   Cleotis Nipper, MD

## 2019-10-19 ENCOUNTER — Other Ambulatory Visit (HOSPITAL_COMMUNITY): Payer: Self-pay | Admitting: *Deleted

## 2019-10-19 ENCOUNTER — Telehealth (HOSPITAL_COMMUNITY): Payer: Self-pay | Admitting: *Deleted

## 2019-10-19 MED ORDER — TEMAZEPAM 30 MG PO CAPS: 30.0000 mg | ORAL_CAPSULE | Freq: Every evening | ORAL | 0 refills | Status: DC | PRN

## 2019-10-19 NOTE — Telephone Encounter (Signed)
She was given prescription with 2 additional refills for her Coreg.  She need to call the pharmacy to get the refills.

## 2019-10-19 NOTE — Telephone Encounter (Signed)
Pt called asking for refill for Coureg 12.5mg  as you had given her one refill. Pt encouraged to make an apoinment with PCP. Pt has appt. With you on 11/05/20. Please review and advise.

## 2019-10-19 NOTE — Telephone Encounter (Signed)
Will call pt. thanks

## 2019-10-20 ENCOUNTER — Other Ambulatory Visit (HOSPITAL_COMMUNITY): Payer: Self-pay | Admitting: *Deleted

## 2019-10-20 MED ORDER — CARVEDILOL 12.5 MG PO TABS: 12.5000 mg | ORAL_TABLET | Freq: Two times a day (BID) | ORAL | 2 refills | Status: DC

## 2019-11-02 ENCOUNTER — Encounter (HOSPITAL_COMMUNITY): Payer: Self-pay

## 2019-11-02 NOTE — Telephone Encounter (Signed)
Opened in error.  Refill not needed yet. Returns 11/06/19.

## 2019-11-06 ENCOUNTER — Ambulatory Visit (INDEPENDENT_AMBULATORY_CARE_PROVIDER_SITE_OTHER): Payer: Commercial Managed Care - PPO | Admitting: Psychiatry

## 2019-11-06 ENCOUNTER — Other Ambulatory Visit: Payer: Self-pay

## 2019-11-06 ENCOUNTER — Encounter (HOSPITAL_COMMUNITY): Payer: Self-pay | Admitting: Psychiatry

## 2019-11-06 ENCOUNTER — Ambulatory Visit (HOSPITAL_COMMUNITY): Payer: Commercial Managed Care - PPO

## 2019-11-06 DIAGNOSIS — F411 Generalized anxiety disorder: Secondary | ICD-10-CM | POA: Diagnosis not present

## 2019-11-06 DIAGNOSIS — F259 Schizoaffective disorder, unspecified: Secondary | ICD-10-CM | POA: Diagnosis not present

## 2019-11-06 DIAGNOSIS — F319 Bipolar disorder, unspecified: Secondary | ICD-10-CM | POA: Diagnosis not present

## 2019-11-06 MED ORDER — HYDROXYZINE HCL 50 MG PO TABS: 50.0000 mg | ORAL_TABLET | Freq: Every day | ORAL | 0 refills | Status: DC

## 2019-11-06 MED ORDER — PERPHENAZINE 16 MG PO TABS: 16.0000 mg | ORAL_TABLET | Freq: Two times a day (BID) | ORAL | 1 refills | Status: DC

## 2019-11-06 MED ORDER — CARBAMAZEPINE ER 300 MG PO CP12: 300.0000 mg | ORAL_CAPSULE | Freq: Two times a day (BID) | ORAL | 1 refills | Status: DC

## 2019-11-06 NOTE — Progress Notes (Signed)
Virtual Visit via Telephone Note  I connected with Lisa [name] on 11/06/19 at 10:00 AM EST by telephone and verified that I am speaking with the correct person using two identifiers.   I discussed the limitations, risks, security and privacy concerns of performing an evaluation and management service by telephone and the availability of in person appointments. I also discussed with the patient that there may be a patient responsible charge related to this service. The patient expressed understanding and agreed to proceed.   History of Present Illness: Patient was evaluated by phone session.  She is a 28 year old Caucasian single, employed female who was seen first time 4 weeks ago when she was referred from inpatient services.  Patient has multiple inpatient in the past.  In the past she has seen multiple doctors but not happy with the care.  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  Patient was discharged on multiple medication.  Patient told today that she is not interested to continue Invega injection because it is expensive and it is making her tired.  She feels the current medicine is working very well.  She is sleeping good.  She denies any irritability, highs and lows, paranoia or any hallucination.  She is also very excited because she is in a process of starting school at [employer] to [employer] in [place].  She also hoping to resume his job on December 21.  She feels that things are going very well.  She realized that she need to take the medicine rest of her life but she wants to take minimal medication so she does not have side effects.  She is sleeping better without temazepam.  Occasionally she takes hydroxyzine when she is anxious.  She has a roommate and that also helping her finances.  Her energy level is good.  She denies any tremors shakes or any EPS.  She is taking Trilafon 16 mg, Tegretol 600 mg.  She like to  try Tegretol 300 mg twice a day rather 200 mg 3 times a day.  She also have issues with getting refill on her carvedilol from the pharmacy.  We have called 1 month supply but pharmacy did not provide and then she called her primary care physician and pharmacy told that they never received the prescription.  She is hoping to remind her primary care physician again to get the carvedilol.  However she do not have any headaches, chest pain or any dizziness.  Her primary care physician is Sigmund Hazel at [facility] physician.    Past Psychiatric History; H/O multiple inpatient started at age 41 when visiting [place] with ex-boyfriend and took street drugs.  H/O inpatient in [place]. Upon arrival back to [place], admitted at [facility] for more than a month.  No h/o suicidal attempt but history of psychosis, anger, paranoia, bizarre delusion and thinking.  Last admission October 2020 at [facility].  Saw Dr. Jannifer Franklin and then Dr. Rene Kocher briefly.  Tried lithium, Risperdal, Haldol, Zyprexa, Geodon, Depakote, Ativan, Latuda, Seroquel and Lamictal.  She recalled most of the medication causes side effects.  No history of suicidal attempt.  No history of abuse.   Psychiatric Specialty Exam: Physical Exam  ROS  There were no vitals taken for this visit.There is no height or weight on file to calculate BMI.  General Appearance: NA  Eye Contact:  NA  Speech:  Clear and Coherent and fast  Volume:  Normal  Mood:  Euthymic  Affect:  NA  Thought Process:  Goal Directed  Orientation:  Full (Time, Place, and Person)  Thought Content:  Logical  Suicidal Thoughts:  No  Homicidal Thoughts:  No  Memory:  Immediate;   Good Recent;   Good Remote;   Good  Judgement:  Fair  Insight:  Fair  Psychomotor Activity:  NA  Concentration:  Concentration: Fair and Attention Span: Fair  Recall:  Good  Fund of Knowledge:  Good  Language:  Fair  Akathisia:  No  Handed:  Right  AIMS (if indicated):     Assets:   Communication Skills Desire for [place] Talents/Skills Transportation  ADL's:  Intact  Cognition:  WNL  Sleep:   ok      Assessment and Plan: Bipolar disorder type I.  Rule out schizoaffective disorder, unspecified type.  Anxiety.   I reviewed medication list with the patient.  She is no longer taking temazepam and she is not interested to take Invega injection.  She feels Trilafon and carbamazepine is working very well for her and she wants to keep the medication in the future.  She decided about [facility] online from [facility].  She do not have any side effects of the medication.  We discussed in detail about the long-term consequences and risk of noncompliance with medication.  She agreed that if symptoms started to get worse then she will call us back immediately.  I also encouraged that she should contact PCP to restart her carvedilol if needed.  She agreed with the plan.  Continue Trilafon 16 mg twice a day, change Tegretol to 300 mg twice a day and hydroxyzine 25 mg as needed.  Follow-up in 2 months.  Follow Up Instructions:    I discussed the assessment and treatment plan with the patient. The patient was provided an opportunity to ask questions and all were answered. The patient agreed with the plan and demonstrated an understanding of the instructions.   The patient was advised to call back or seek an in-person evaluation if the symptoms worsen or if the condition fails to improve as anticipated.  I provided 20 minutes of non-face-to-face time during this encounter.   Kathlee Nations, MD

## 2019-11-24 ENCOUNTER — Other Ambulatory Visit (HOSPITAL_COMMUNITY): Payer: Self-pay | Admitting: Psychiatry

## 2019-11-24 DIAGNOSIS — F319 Bipolar disorder, unspecified: Secondary | ICD-10-CM

## 2019-12-28 ENCOUNTER — Other Ambulatory Visit (HOSPITAL_COMMUNITY): Payer: Self-pay | Admitting: Psychiatry

## 2019-12-28 DIAGNOSIS — F259 Schizoaffective disorder, unspecified: Secondary | ICD-10-CM

## 2019-12-28 DIAGNOSIS — F319 Bipolar disorder, unspecified: Secondary | ICD-10-CM

## 2019-12-30 ENCOUNTER — Other Ambulatory Visit (HOSPITAL_COMMUNITY): Payer: Self-pay | Admitting: Psychiatry

## 2019-12-30 DIAGNOSIS — F259 Schizoaffective disorder, unspecified: Secondary | ICD-10-CM

## 2020-01-13 ENCOUNTER — Other Ambulatory Visit: Payer: Self-pay

## 2020-01-13 ENCOUNTER — Encounter (HOSPITAL_COMMUNITY): Payer: Self-pay | Admitting: Psychiatry

## 2020-01-13 ENCOUNTER — Ambulatory Visit (INDEPENDENT_AMBULATORY_CARE_PROVIDER_SITE_OTHER): Payer: Commercial Managed Care - PPO | Admitting: Psychiatry

## 2020-01-13 DIAGNOSIS — F411 Generalized anxiety disorder: Secondary | ICD-10-CM | POA: Diagnosis not present

## 2020-01-13 DIAGNOSIS — F319 Bipolar disorder, unspecified: Secondary | ICD-10-CM

## 2020-01-13 DIAGNOSIS — Z79899 Other long term (current) drug therapy: Secondary | ICD-10-CM

## 2020-01-13 MED ORDER — CARBAMAZEPINE ER 300 MG PO CP12: 300.0000 mg | ORAL_CAPSULE | Freq: Two times a day (BID) | ORAL | 1 refills | Status: DC

## 2020-01-13 MED ORDER — LORAZEPAM 0.5 MG PO TABS: 0.5000 mg | ORAL_TABLET | ORAL | 0 refills | Status: DC | PRN

## 2020-01-13 MED ORDER — PERPHENAZINE 16 MG PO TABS: 16.0000 mg | ORAL_TABLET | Freq: Two times a day (BID) | ORAL | 1 refills | Status: DC

## 2020-01-13 MED ORDER — HYDROXYZINE HCL 50 MG PO TABS: 50.0000 mg | ORAL_TABLET | Freq: Every day | ORAL | 1 refills | Status: DC

## 2020-01-13 NOTE — Progress Notes (Signed)
Virtual Visit via Telephone Note  I connected with Lisa [name] on 01/13/20 at 10:00 AM EST by telephone and verified that I am speaking with the correct person using two identifiers.   I discussed the limitations, risks, security and privacy concerns of performing an evaluation and management service by telephone and the availability of in person appointments. I also discussed with the patient that there may be a patient responsible charge related to this service. The patient expressed understanding and agreed to proceed.   History of Present Illness: Patient was evaluated through phone session.  She is experiencing anxiety and also depression.  She is not happy with her present job because she has a lot of downtime and she thinks negative and feels sad.  She is thinking to switch her job and like to move in with her parents in [place].  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  Patient reported her anxiety usually triggers for no reason and she believes she is having panic attack.  She denies any hallucination, paranoia, crying spells.  She sleeps usually good with the hydroxyzine.  She is compliant with Trilafon and Tegretol.  She had tried leftover lorazepam which was given by previous provider and that helped her anxiety.  She now understand that she need to deal with her illness better than she anticipated.  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  She feels that it was her decision made when she was little manic.  She does not have money to pursue.  She also did not contact her PCP for carvedilol.  She believes she does not have high blood pressure but also not checking her blood pressure regularly.  Now she is interested in therapy so she has a better scale coping with her illness.  She denies any suicidal thoughts.  Her appetite is okay.  She has no EPS, tremors or any rash.    Past Psychiatric  History; H/O multiple inpatient started at age 29 when visiting [place] with ex-boyfriend and took street drugs. H/O inpatient in [place]. Upon arrival admitted at [facility] for a month. No h/o suicidal attempt. H/O psychosis, anger, paranoia, bizarre delusion. Last inpatient October 2020 at [facility]. Saw Dr. Jannifer Franklin and then Dr. Rene Kocher briefly. Tried lithium, Risperdal, Haldol, Zyprexa, Geodon, Depakote, Ativan, Latuda, Seroquel, Lamictal, Invega injection, Restoril. H/O noncompliance because of side effects.   Psychiatric Specialty Exam: Physical Exam  Review of Systems  There were no vitals taken for this visit.There is no height or weight on file to calculate BMI.  General Appearance: NA  Eye Contact:  NA  Speech:  Normal Rate  Volume:  Normal  Mood:  Anxious  Affect:  NA  Thought Process:  Descriptions of Associations: Intact  Orientation:  Full (Time, Place, and Person)  Thought Content:  Rumination  Suicidal Thoughts:  No  Homicidal Thoughts:  No  Memory:  Immediate;   Good Recent;   Good Remote;   Good  Judgement:  Fair  Insight:  Fair  Psychomotor Activity:  NA  Concentration:  Concentration: Good and Attention Span: Good  Recall:  Good  Fund of Knowledge:  Good  Language:  Good  Akathisia:  No  Handed:  Right  AIMS (if indicated):     Assets:  Communication Skills Desire for Improvement Housing Resilience Social Support  ADL's:  Intact  Cognition:  WNL  Sleep:   ok  Assessment and Plan: Bipolar disorder type I.  Anxiety.  Rule out schizoaffective disorder, unspecified type.  I discussed patient's current medication.  She was discharged from the hospital on Invega injection and she is not interested.  We discussed her symptoms of mania and depression.  I agree that she should see a therapist for better understanding of her illness and how to cope with the symptoms.  I will provide lorazepam 0.5 mg to take as needed for severe anxiety  attacks.  I will also do Tegretol level.  I will continue Trilafon 16 mg twice a day, Tegretol 300 mg twice a day and hydroxyzine 50 mg at bedtime.  Recommended to call us back if she has any question or any concern.  Follow-up in 6 weeks.  Follow Up Instructions:    I discussed the assessment and treatment plan with the patient. The patient was provided an opportunity to ask questions and all were answered. The patient agreed with the plan and demonstrated an understanding of the instructions.   The patient was advised to call back or seek an in-person evaluation if the symptoms worsen or if the condition fails to improve as anticipated.  I provided 20 minutes of non-face-to-face time during this encounter.   Kathlee Nations, MD

## 2020-01-14 ENCOUNTER — Telehealth (HOSPITAL_COMMUNITY): Payer: Self-pay | Admitting: Licensed Clinical Social Worker

## 2020-01-14 ENCOUNTER — Ambulatory Visit (INDEPENDENT_AMBULATORY_CARE_PROVIDER_SITE_OTHER): Payer: Commercial Managed Care - PPO | Admitting: Licensed Clinical Social Worker

## 2020-01-14 ENCOUNTER — Other Ambulatory Visit: Payer: Self-pay

## 2020-01-14 DIAGNOSIS — F312 Bipolar disorder, current episode manic severe with psychotic features: Secondary | ICD-10-CM

## 2020-01-14 DIAGNOSIS — F250 Schizoaffective disorder, bipolar type: Secondary | ICD-10-CM | POA: Diagnosis not present

## 2020-01-14 DIAGNOSIS — F3130 Bipolar disorder, current episode depressed, mild or moderate severity, unspecified: Secondary | ICD-10-CM
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
## 2020-01-19 ENCOUNTER — Other Ambulatory Visit (HOSPITAL_COMMUNITY): Payer: Self-pay | Admitting: Psychiatry

## 2020-01-20 LAB — CARBAMAZEPINE LEVEL, TOTAL: Carbamazepine (Tegretol), S: 7.2 ug/mL (ref 4.0–12.0)

## 2020-01-22 ENCOUNTER — Telehealth (HOSPITAL_COMMUNITY): Payer: Self-pay | Admitting: Licensed Clinical Social Worker

## 2020-01-22 NOTE — Telephone Encounter (Signed)
Attempted contact w/ pt to schedule therapy appt. Left voicemail encouraging pt to contact front desk to schedule this appt. Provided contact info for front desk at this clinic.

## 2020-01-28 ENCOUNTER — Ambulatory Visit (INDEPENDENT_AMBULATORY_CARE_PROVIDER_SITE_OTHER): Payer: Commercial Managed Care - PPO | Admitting: Licensed Clinical Social Worker

## 2020-01-28 ENCOUNTER — Other Ambulatory Visit: Payer: Self-pay

## 2020-01-28 DIAGNOSIS — F411 Generalized anxiety disorder: Secondary | ICD-10-CM

## 2020-01-28 DIAGNOSIS — F312 Bipolar disorder, current episode manic severe with psychotic features: Secondary | ICD-10-CM

## 2020-01-28 NOTE — Progress Notes (Signed)
Virtual Visit via Telephone Note  I connected with Lisa [name] on 01/28/20 at  8:00 AM EST by telephone and verified that I am speaking with the correct person using two identifiers.   I discussed the limitations, risks, security and privacy concerns of performing an evaluation and management service by telephone and the availability of in person appointments. I also discussed with the patient that there may be a patient responsible charge related to this service. The patient expressed understanding and agreed to proceed.  I discussed the assessment and treatment plan with the patient. The patient was provided an opportunity to ask questions and all were answered. The patient agreed with the plan and demonstrated an understanding of the instructions.   The patient was advised to call back or seek an in-person evaluation if the symptoms worsen or if the condition fails to improve as anticipated.  I provided 45 minutes of non-face-to-face time during this encounter.   Renee Harder, LCSW       THERAPIST PROGRESS NOTE  Session Time: 8am-8:45am  Participation Level: Active  Behavioral Response: NAAlertAnxious  Type of Therapy: Individual Therapy  Treatment Goals addressed: Coping skills, re-framing unhelpful thoughts, problem solving  Interventions: CBT, Motivational Interview, Solution Focused  Summary: Client joined session alert and oriented x5. Client checked in and provided an update on current stressors or changes since our last meeting. Client described current stressors related to employment and moving. Client identified feelings of shame and guilt related to decisions made while in a manic episode. Client was receptive to re-framing unhelpful thoughts and identified a new emotion of empowered. Client relayed her intentions of self-care prior to our next meeting which include being involved in a church she recently joined. Client denied current SI/HI,  psychosis.  Suicidal/Homicidal: Nowithout intent/plan  Therapist Response: Clinician met with client and assessed for mood, SI/HI, psychosis. Clinician validated client's feelings related to current stressors. Clinician assessed for patterns of bx related to the recent circumstances. Clinician walked client through the CBT Triangle and challenged unhelpful thoughts. Clinician praised client's insight and willingness to re-frame these thoughts. Clinician encouraged client to contact nurses in this clinic for any medication issues/concerns. Clinician provided the date/time for her next Dr appt. Clinician agreed to see client again in 3 weeks.  Plan: Return again in 3 weeks.  Diagnosis: Axis I: Bipolar disorder, current episode manic severe with psychotic features ([place])        Renee Harder, LCSW 01/28/2020

## 2020-02-01 NOTE — Telephone Encounter (Signed)
Clinician contacted client by telephone to confirm individual therapy appt for 01/21/20 at 1pm via Webex. Client confirmed.

## 2020-02-10 ENCOUNTER — Other Ambulatory Visit: Payer: Self-pay

## 2020-02-10 ENCOUNTER — Encounter (HOSPITAL_COMMUNITY): Payer: Self-pay | Admitting: Psychiatry

## 2020-02-10 ENCOUNTER — Ambulatory Visit (INDEPENDENT_AMBULATORY_CARE_PROVIDER_SITE_OTHER): Payer: Commercial Managed Care - PPO | Admitting: Psychiatry

## 2020-02-10 DIAGNOSIS — F411 Generalized anxiety disorder: Secondary | ICD-10-CM | POA: Diagnosis not present

## 2020-02-10 DIAGNOSIS — F250 Schizoaffective disorder, bipolar type: Secondary | ICD-10-CM

## 2020-02-10 MED ORDER — LORAZEPAM 0.5 MG PO TABS: 0.5000 mg | ORAL_TABLET | ORAL | 0 refills | Status: DC | PRN

## 2020-02-10 MED ORDER — CARBAMAZEPINE ER 300 MG PO CP12: 300.0000 mg | ORAL_CAPSULE | Freq: Two times a day (BID) | ORAL | 1 refills | Status: DC

## 2020-02-10 MED ORDER — HYDROXYZINE HCL 50 MG PO TABS: 50.0000 mg | ORAL_TABLET | Freq: Every day | ORAL | 0 refills | Status: DC

## 2020-02-10 MED ORDER — PERPHENAZINE 16 MG PO TABS: 16.0000 mg | ORAL_TABLET | Freq: Two times a day (BID) | ORAL | 1 refills | Status: DC
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
## 2020-02-22 ENCOUNTER — Ambulatory Visit (INDEPENDENT_AMBULATORY_CARE_PROVIDER_SITE_OTHER): Payer: Commercial Managed Care - PPO | Admitting: Licensed Clinical Social Worker

## 2020-02-22 ENCOUNTER — Other Ambulatory Visit: Payer: Self-pay

## 2020-02-22 DIAGNOSIS — F250 Schizoaffective disorder, bipolar type: Secondary | ICD-10-CM

## 2020-02-22 DIAGNOSIS — F411 Generalized anxiety disorder: Secondary | ICD-10-CM | POA: Diagnosis not present

## 2020-02-22 NOTE — Progress Notes (Signed)
Virtual Visit via Telephone Note  I connected with Lisa [name] on 02/22/20 at  8:00 AM EDT by telephone and verified that I am speaking with the correct person using two identifiers.  The patient was advised to call back or seek an in-person evaluation if the symptoms worsen or if the condition fails to improve as anticipated.  I provided 50 minutes of non-face-to-face time during this encounter.   Francine Graven, LCSW    THERAPIST PROGRESS NOTE  Session Time: 8:00am-8:50am  Participation Level: Active  Behavioral Response: NAAlertAnxious  Type of Therapy: Individual Therapy  Treatment Goals addressed: Coping  Interventions: CBT, Motivational Interviewing, Supportive, Solution focused  Summary: Client presented alert and oriented x5 for today's session. Client checked in and reported an increase in anxiety symptoms over the last 2 weeks. Client reports sx of blurred vision, intrusive thoughts, and negative self-talk. Client relayed that she is working towards obtaining new employment and has concerns that she will not be able to perform duties due to these sx and often catastrophizes situations. Client reported difficulty challenging thoughts in the moment and was receptive to feedback on potential solutions to prompt the utilization of skills. Client agreed to contact nursing staff in this clinic to report increase of sx and is hoping to move up her appointment w/ Dr. Lolly Mustache. Client also agreed to contact her PCP due to potential issues with blood pressure. Client denied current SI/HI/psychosis.  Suicidal/Homicidal: Nowithout intent/plan  Therapist Response: Clinician joined client for today's session and assessed for recent stressors, changes in mood, and SI/HI/psychosis. Clinician validated client's feelings and assessed for specific triggers for identified symptoms. Clinician encouraged client to utilize grounding techniques as well as CBT thought record to trace and challenge  unhelpful thoughts and negative self-talk. Clinician agreed to send client resources on today's discussion. Clinician encouraged client to contact her PCP due to reported blurred vision which causes anxiety as well as nursing staff in this clinic to update psychiatrist on increasing sx. Clinician scheduled client to be seen again on 03/07/20.  Plan: Return again in 2 weeks.  Diagnosis: Axis I: Schizoaffective disorder, unspecified type, Anxiety        Francine Graven, LCSW 02/22/2020

## 2020-02-23 ENCOUNTER — Other Ambulatory Visit: Payer: Self-pay

## 2020-02-23 ENCOUNTER — Ambulatory Visit (INDEPENDENT_AMBULATORY_CARE_PROVIDER_SITE_OTHER): Payer: Commercial Managed Care - PPO | Admitting: Psychiatry

## 2020-02-23 ENCOUNTER — Encounter (HOSPITAL_COMMUNITY): Payer: Self-pay | Admitting: Psychiatry

## 2020-02-23 DIAGNOSIS — F250 Schizoaffective disorder, bipolar type: Secondary | ICD-10-CM | POA: Diagnosis not present

## 2020-02-23 DIAGNOSIS — F411 Generalized anxiety disorder: Secondary | ICD-10-CM | POA: Diagnosis not present

## 2020-02-23 MED ORDER — HYDROXYZINE HCL 25 MG PO TABS: 25.0000 mg | ORAL_TABLET | Freq: Every day | ORAL | 0 refills | Status: DC

## 2020-02-23 MED ORDER — LORAZEPAM 0.5 MG PO TABS: 0.5000 mg | ORAL_TABLET | ORAL | 0 refills | Status: DC | PRN

## 2020-02-23 MED ORDER — PALIPERIDONE ER 3 MG PO TB24: 3.0000 mg | ORAL_TABLET | Freq: Every day | ORAL | 0 refills | Status: DC

## 2020-02-23 NOTE — Progress Notes (Signed)
Virtual Visit via Telephone Note  I connected with Lisa [name] on 02/23/20 at  9:40 AM EDT by telephone and verified that I am speaking with the correct person using two identifiers.   I discussed the limitations, risks, security and privacy concerns of performing an evaluation and management service by telephone and the availability of in person appointments. I also discussed with the patient that there may be a patient responsible charge related to this service. The patient expressed understanding and agreed to proceed.   History of Present Illness: Patient was evaluated through phone session.  She requested earlier appointment because she is feeling not good.  She continued to have vision issues which she described sometimes shyness, blurry and she feels it is related to anxiety.  In the meeting she mention that she needed lorazepam every day to calm her anxiety but later she mention that sometimes she is having nonhorrible and soft voices.  She also reported started to having fleeting and passive suicidal thoughts but no plan or any intent.  She is not sure if there is psychosis but she is afraid and does not want to be get worse.  We have recommended to see her PCP as patient mentioned that sometimes she has blurry vision while she is exercising.  She has a history of hypertension but lately she has been monitoring her blood pressure and describe it is normal.  She is not taking any antihypertensive medication.  She also concerned about her downtime.  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  She is actively looking for new job.  She is seeing Leandro Reasoner.  She denies any anger or any aggression.   Past Psychiatric History; H/Omultiple inpatient started age 78 after visiting [place] with ex-boyfriend and took street drugs.H/Oinpatientin [place].Upon arrival admitted at [facility] for a month. Noh/osuicidal attempt.  H/Opsychosis,anger,paranoia, bizarre delusion.LastinpatientOctober 2020 at [facility]. Saw Dr. Darleene Cleaver and then Dr. Daron Offer briefly. Tried lithium, Risperdal, Haldol, Zyprexa, Geodon, Depakote, Ativan, Latuda, Seroquel,Lamictal, Invega injection, Restoril. H/Ononcompliance because of side effects.   Psychiatric Specialty Exam: Physical Exam  Review of Systems  There were no vitals taken for this visit.There is no height or weight on file to calculate BMI.  General Appearance: NA  Eye Contact:  NA  Speech:  Clear and Coherent and Slow  Volume:  Decreased  Mood:  Anxious and Dysphoric  Affect:  NA  Thought Process:  Descriptions of Associations: Intact  Orientation:  Full (Time, Place, and Person)  Thought Content:  Hallucinations: Auditory soft and non-audible voices. , Paranoid Ideation and Rumination  Suicidal Thoughts:  passive and fleeting thoughts but no plan  Homicidal Thoughts:  No  Memory:  Immediate;   Good Recent;   Good Remote;   Good  Judgement:  Intact  Insight:  Present  Psychomotor Activity:  NA  Concentration:  Concentration: Good and Attention Span: Fair  Recall:  Good  Fund of Knowledge:  Good  Language:  Good  Akathisia:  No  Handed:  Right  AIMS (if indicated):     Assets:  Communication Skills Desire for Improvement Housing Resilience Social Support Transportation  ADL's:  Intact  Cognition:  WNL  Sleep:   too much      Assessment and Plan: Schizoaffective disorder, unspecified type.  Anxiety.  I do believe patient slowly decompensating.  Her unspecific symptoms of blurry vision, shyness in some time she feels that  she is having psychotic episodes concerns that maybe she is slowly decompensating.  I encouraged that she should see PCP or even consider seeing ophthalmologist if she continues to have vision issues.  We will increase lorazepam to 0.5 mg so she can take every day to help with anxiety.  We also talked about Invega injection but patient  is concerned because her insurance does not cover it is very expensive.  She is willing to try oral Invega to see if symptoms resolve.  She is saying that sleeping too much.  I recommend to cut down the hydroxyzine to 25 mg only if she cannot sleep.  We will continue all her other medication and we will add low-dose Invega 3 mg to help with psychosis.  We will do follow-up in 3 weeks.  I also recommend that if symptoms do not improve then she should not wait for 3 weeks or anytime having active suicidal thoughts then she should call us immediately.  We also talk about group therapy but patient wants to see if medicine adjustment can get better her symptoms.  She will continue Trilafon 16 mg twice a day, Tegretol 300 mg twice a day, she will take lorazepam 0.5 mg daily to help her anxiety and we will reduce hydroxyzine 25 mg to take only if she cannot sleep at bedtime.  Encouraged to continue therapy Isla Pence.  Follow-up in 3 weeks.  Discussed safety concerns.  Time spent 30 minutes.  Follow Up Instructions:    I discussed the assessment and treatment plan with the patient. The patient was provided an opportunity to ask questions and all were answered. The patient agreed with the plan and demonstrated an understanding of the instructions.   The patient was advised to call back or seek an in-person evaluation if the symptoms worsen or if the condition fails to improve as anticipated.  I provided 30 minutes of non-face-to-face time during this encounter.   Cleotis Nipper, MD

## 2020-02-29 ENCOUNTER — Telehealth (HOSPITAL_COMMUNITY): Payer: Self-pay

## 2020-02-29 DIAGNOSIS — F250 Schizoaffective disorder, bipolar type: Secondary | ICD-10-CM

## 2020-02-29 MED ORDER — PERPHENAZINE 16 MG PO TABS: 16.0000 mg | ORAL_TABLET | Freq: Two times a day (BID) | ORAL | 0 refills | Status: DC

## 2020-02-29 NOTE — Telephone Encounter (Signed)
Received fax from [facility] pharmacy requesting 90 day supply of perphenazine 16mg  tablets instead of the 30 day supply. Next appointment 03/16/2020. Please review and advise.

## 2020-02-29 NOTE — Telephone Encounter (Signed)
Done

## 2020-03-07 ENCOUNTER — Ambulatory Visit (INDEPENDENT_AMBULATORY_CARE_PROVIDER_SITE_OTHER): Payer: Commercial Managed Care - PPO | Admitting: Licensed Clinical Social Worker

## 2020-03-07 ENCOUNTER — Other Ambulatory Visit: Payer: Self-pay

## 2020-03-07 DIAGNOSIS — F411 Generalized anxiety disorder: Secondary | ICD-10-CM | POA: Diagnosis not present

## 2020-03-07 DIAGNOSIS — F250 Schizoaffective disorder, bipolar type: Secondary | ICD-10-CM

## 2020-03-10 NOTE — Progress Notes (Signed)
Virtual Visit via Telephone Note  I connected with Lisa [name] on 03/10/20 at  8:00 AM EDT by telephone and verified that I am speaking with the correct person using two identifiers.   The patient was advised to call back or seek an in-person evaluation if the symptoms worsen or if the condition fails to improve as anticipated.  I provided 30 minutes of non-face-to-face time during this encounter.   Francine Graven, LCSW      THERAPIST PROGRESS NOTE  Session Time: 8:10am-8:40am  Participation Level: Active  Behavioral Response: NAAlertAnxious  Type of Therapy: Individual Therapy  Treatment Goals addressed: Anxiety and Coping  Interventions: CBT, Motivational Interviewing, Solution Focused and Supportive  Summary: Client presented alert and oriented x5 for scheduled session. Client checked in and provided an update reporting that she has continued to feel anxious and experience panic attacks. Client verbalized fear of beginning new employment and having a panic attack on the job. Client reported difficulty utilizing coping skills in the moment. Client did confirm making an appointment to check out her eyes due to vision issues and reported she is unsure if anxiety is causing this problem or if she is having difficulty seeing which then increases her anxiety. 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 Client denied SI/HI/psychosis.   Suicidal/Homicidal: Nowithout intent/plan  Therapist Response: Clinician joined client for today's session and facilitated check in assessing recent stressors, changes in mood, and SI/HI/psychosis. Clinician validated client's feelings and assessed for potential triggers and coping skills used. Clinician praised client for arranging appointments to rule out potential medical conditions. Clinician congratulated  client for obtaining new employment and processed benefits of client working in the office with routine and structure. Clinician utilized CBT to challenge client's thoughts to decrease anxiety and encouraged client to continue utilizing thought record to use this skill outside of the therapy room. Clinician agreed to see client again on 04/01/20.  Plan: Return again in 2-3 weeks.  Diagnosis: Axis I: GAD, schizoaffective d/o      Francine Graven, LCSW 03/10/2020

## 2020-03-16 ENCOUNTER — Other Ambulatory Visit: Payer: Self-pay

## 2020-03-16 ENCOUNTER — Encounter (HOSPITAL_COMMUNITY): Payer: Self-pay | Admitting: Psychiatry

## 2020-03-16 ENCOUNTER — Other Ambulatory Visit (HOSPITAL_COMMUNITY): Payer: Self-pay

## 2020-03-16 ENCOUNTER — Ambulatory Visit (INDEPENDENT_AMBULATORY_CARE_PROVIDER_SITE_OTHER): Payer: Commercial Managed Care - PPO | Admitting: Psychiatry

## 2020-03-16 VITALS — BP 120/67 | Wt 167.0 lb

## 2020-03-16 DIAGNOSIS — F250 Schizoaffective disorder, bipolar type: Secondary | ICD-10-CM | POA: Diagnosis not present

## 2020-03-16 DIAGNOSIS — F411 Generalized anxiety disorder: Secondary | ICD-10-CM | POA: Diagnosis not present

## 2020-03-16 MED ORDER — LORAZEPAM 0.5 MG PO TABS: 0.5000 mg | ORAL_TABLET | ORAL | 0 refills | Status: DC | PRN

## 2020-03-16 MED ORDER — PALIPERIDONE ER 6 MG PO TB24: 6.0000 mg | ORAL_TABLET | Freq: Every day | ORAL | 0 refills | Status: DC

## 2020-03-16 NOTE — Progress Notes (Signed)
Virtual Visit via Telephone Note  I connected with Enolia Koepke [place]goats on 03/16/20 at  9:00 AM EDT by telephone and verified that I am speaking with the correct person using two identifiers.   I discussed the limitations, risks, security and privacy concerns of performing an evaluation and management service by telephone and the availability of in person appointments. I also discussed with the patient that there may be a patient responsible charge related to this service. The patient expressed understanding and agreed to proceed.   History of Present Illness: Patient was evaluated by phone session.  On the last visit we tried Invega 3 mg and recommended to take lorazepam 0.5 mg daily to help with anxiety.  Patient reported her anxiety is much better but she continues to have nonspecific symptoms which she described blurry vision, brightness in her eye, sometimes difficulty speaking and her speech gets slurred.  She saw her eye doctor and she was told everything is normal.  She does not feel it is psychotic symptoms since she does not hear any voices or not having any paranoia or delusion.  She she denies any active or passive suicidal thoughts since her anxiety is under control.  However she is concerned about her symptoms.  She checked her blood pressure which is also normal.  She is not taking any antihypertensive medication.  But she feels little bit better with addition of Invega and taking lorazepam every day but like to have the side effects or nonspecific symptoms go away.  She excited because she is going to start a new job at [employer].  She is living with her parents and that is very supportive.  She is still working full-time as a Education officer, [employer].  Currently she is taking Trilafon 16 mg twice a day, Tegretol twice a day, Invega 3 mg and lorazepam 0.5 mg.  Since she taking the lorazepam she is no longer taking hydroxyzine.  Energy level is fair.  Her appetite is stable.     Psychiatric  Specialty Exam: Physical Exam  Review of Systems  Eyes:       Blurry vision    Blood pressure 120/67, weight 167 lb (75.8 kg).Body mass index is 26.95 kg/m.  General Appearance: NA  Eye Contact:  NA  Speech:  Normal Rate  Volume:  Normal  Mood:  Anxious  Affect:  NA  Thought Process:  Descriptions of Associations: Intact  Orientation:  Full (Time, Place, and Person)  Thought Content:  Rumination  Suicidal Thoughts:  No  Homicidal Thoughts:  No  Memory:  Immediate;   Good Recent;   Good Remote;   Good  Judgement:  Intact  Insight:  Lacking  Psychomotor Activity:  NA  Concentration:  Concentration: Fair and Attention Span: Fair  Recall:  Good  Fund of Knowledge:  Good  Language:  Good  Akathisia:  No  Handed:  Right  AIMS (if indicated):     Assets:  Communication Skills Desire for Improvement Housing Resilience Social Support Talents/Skills Transportation  ADL's:  Intact  Cognition:  WNL  Sleep:   better      Assessment and Plan: Schizoaffective disorder, unspecified type.  Anxiety.  Patient shown some improvement since taking the lorazepam every day and low-dose Invega.  Her anxiety is somewhat better and she has no more suicidal thoughts but she still have nonspecific symptoms which she described blurry vision, brightness in her eye, sometimes slurry speech.  She had eye exam which was normal.  Her  blood pressure is normal.  I recommend to cut down the Tegretol take 1 a day as it could be due to side effects of Tegretol even though her Tegretol level back in February was therapeutic.  I recommend to try Invega 6 mg and also reduce Trilafon to take 60 mg a day.  The patient doing well on Invega we may stop the Tegretol completely.  If her symptoms continue to persist then we will refer her to see neurology.  Encouraged to continue therapy with Isla Pence.  Recommend to call us back if she has any question or any concern.  She will continue lorazepam 0.5 mg daily.  I  will discontinue hydroxyzine as patient is not taking it and does not need it.  I offer IOP but patient is not interested in group therapy but she like to have a follow-up in 4 weeks.  Time spent 25 minutes.  Follow Up Instructions:    I discussed the assessment and treatment plan with the patient. The patient was provided an opportunity to ask questions and all were answered. The patient agreed with the plan and demonstrated an understanding of the instructions.   The patient was advised to call back or seek an in-person evaluation if the symptoms worsen or if the condition fails to improve as anticipated.  I provided 25 minutes of non-face-to-face time during this encounter.   Cleotis Nipper, MD

## 2020-04-01 ENCOUNTER — Other Ambulatory Visit: Payer: Self-pay

## 2020-04-01 ENCOUNTER — Ambulatory Visit (INDEPENDENT_AMBULATORY_CARE_PROVIDER_SITE_OTHER): Payer: Commercial Managed Care - PPO | Admitting: Licensed Clinical Social Worker

## 2020-04-01 DIAGNOSIS — F411 Generalized anxiety disorder: Secondary | ICD-10-CM

## 2020-04-01 DIAGNOSIS — F250 Schizoaffective disorder, bipolar type: Secondary | ICD-10-CM | POA: Diagnosis not present

## 2020-04-01 NOTE — Progress Notes (Signed)
Virtual Visit via Telephone Note  I connected with Janett J Nygaard on 04/01/20 at  9:00 AM EDT by telephone and verified that I am speaking with the correct person using two identifiers.   The patient was advised to call back or seek an in-person evaluation if the symptoms worsen or if the condition fails to improve as anticipated.  I provided 30 minutes of non-face-to-face time during this encounter.    , LCSW    THERAPIST PROGRESS NOTE  Session Time: 9am-9:30am  Participation Level: Active  Behavioral Response: NAAlertEuthymic  Type of Therapy: Individual Therapy  Treatment Goals addressed: Coping  Interventions: CBT, Motivational Interviewing, Strength-based and Supportive  Summary: Client presented for scheduled therapy session via telephone. Client checked in and provided an update reporting that she has felt a slight improvement in mood which has resulted in less panic and anxiety sx according to her. Client relayed that she plans to start her new job on Monday and is feeling more prepared and excited to begin. 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 Client engaged in discussion on intentionality in keeping busy due to her mind wandering when feeling bored. Client agreed to be seen again on 04/18/20. Client denied SI/HI/psychosis.  Suicidal/Homicidal: Nowithout intent/plan  Therapist Response: Clinician met with client and facilitated check in assessing recent stressors, changes in mood, and SI/HI/psychosis. Clinician congratulated client on starting her new position next week and assessed thoughts and feelings related to beginning a new journey with a new employer. Clinician validated client's feelings and praised her insight  while empowering client by encouraging her to think of means in which her personal struggles can benefit her within her career such as increasing empathy for the client's that she works with. Clinician engaged in discussion on preventative actions such as planning activities to keep self busy to avoid boredom. Clinician provided client with date/times of future appointments within this office and will follow up with her in 2-3 weeks.  Plan: Return again in 2-3 weeks.  Diagnosis: Axis I: GAD, Schizoaffective d/o         , LCSW 04/01/2020  

## 2020-04-11 ENCOUNTER — Other Ambulatory Visit (HOSPITAL_COMMUNITY): Payer: Self-pay | Admitting: Psychiatry

## 2020-04-11 DIAGNOSIS — F250 Schizoaffective disorder, bipolar type: Secondary | ICD-10-CM

## 2020-04-12 ENCOUNTER — Telehealth (INDEPENDENT_AMBULATORY_CARE_PROVIDER_SITE_OTHER): Payer: Commercial Managed Care - PPO | Admitting: Psychiatry

## 2020-04-12 ENCOUNTER — Ambulatory Visit (HOSPITAL_COMMUNITY): Payer: Commercial Managed Care - PPO | Admitting: Psychiatry

## 2020-04-12 ENCOUNTER — Other Ambulatory Visit: Payer: Self-pay

## 2020-04-12 ENCOUNTER — Encounter (HOSPITAL_COMMUNITY): Payer: Self-pay | Admitting: Psychiatry

## 2020-04-12 DIAGNOSIS — F250 Schizoaffective disorder, bipolar type: Secondary | ICD-10-CM

## 2020-04-12 DIAGNOSIS — F411 Generalized anxiety disorder: Secondary | ICD-10-CM

## 2020-04-12 MED ORDER — LORAZEPAM 0.5 MG PO TABS: 0.5000 mg | ORAL_TABLET | Freq: Two times a day (BID) | ORAL | 0 refills | Status: DC | PRN

## 2020-04-12 MED ORDER — PERPHENAZINE 16 MG PO TABS: 16.0000 mg | ORAL_TABLET | Freq: Every day | ORAL | 0 refills | Status: DC

## 2020-04-12 MED ORDER — PALIPERIDONE ER 6 MG PO TB24: 6.0000 mg | ORAL_TABLET | Freq: Every day | ORAL | 0 refills | Status: DC

## 2020-04-12 NOTE — Progress Notes (Signed)
Virtual Visit via Telephone Note  I connected with Lisa [name] on 04/12/20 at  9:00 AM EDT by telephone and verified that I am speaking with the correct person using two identifiers.   I discussed the limitations, risks, security and privacy concerns of performing an evaluation and management service by telephone and the availability of in person appointments. I also discussed with the patient that there may be a patient responsible charge related to this service. The patient expressed understanding and agreed to proceed.   History of Present Illness: Patient is evaluated by phone session.  She continues to have blurry vision which she describes brightness and she cannot focus and make her very anxious.  She admitted taking more lorazepam to calm her anxiety.  We had cut down her Tegretol to 300 and reduce Trilafon to only 16 mg at bedtime.  However she does not feel symptoms go away.  She is taking Invega 6 mg.  She denies any hallucination, paranoia or any suicidal thoughts.  She started a new job at [employer] and she liked it.  She is living with her parents were very supportive.  She has no tremors, rash, itching but concerned about blurry vision.  She like to stop the Tegretol to see if her symptoms go away.  She has not consulted neurology because she wanted to stop the Tegretol if symptoms go away.  She had MRI of the brain last year which was normal.  Her Tegretol level was last time therapeutic.  She feels her thinking is clear.  She is in therapy with Willey Blade but sometimes she feels that she is not in charge of therapy.  Her appetite is okay.  Past Psychiatric History; H/Omultiple inpatient started age 29 after visiting [place] with ex-boyfriend and took street drugs.H/Oinpatientin [place].Upon arrival admitted at [facility] for a month. Noh/osuicidal attempt. H/Opsychosis,anger,paranoia, bizarre delusion.LastinpatientOctober [facility] at [facility]. Saw Dr.  Darleene Cleaver and then Dr. Daron Offer briefly. Tried lithium, Risperdal, Haldol, Zyprexa, Geodon, Depakote, Ativan, Latuda, Seroquel,Lamictal, Invega injection, Restoril. H/Ononcompliance because of side effects.   Psychiatric Specialty Exam: Physical Exam  Review of Systems  There were no vitals taken for this visit.There is no height or weight on file to calculate BMI.  General Appearance: NA  Eye Contact:  NA  Speech:  Clear and Coherent  Volume:  Normal  Mood:  Anxious  Affect:  NA  Thought Process:  Goal Directed  Orientation:  Full (Time, Place, and Person)  Thought Content:  Rumination  Suicidal Thoughts:  No  Homicidal Thoughts:  No  Memory:  Immediate;   Good Recent;   Good Remote;   Good  Judgement:  Good  Insight:  Good  Psychomotor Activity:  NA  Concentration:  Concentration: Good and Attention Span: Good  Recall:  Good  Fund of Knowledge:  Good  Language:  Good  Akathisia:  No  Handed:  Right  AIMS (if indicated):     Assets:  Communication Skills Desire for Improvement Housing Resilience Social Support Talents/Skills  ADL's:  Intact  Cognition:  WNL  Sleep:   ok      Assessment and Plan: Schizoaffective disorder, unspecified type.  Anxiety.  Patient like to stop the Tegretol to see if her blurry vision go away.  She feels it is making her more anxious.  She also feels brightness in her eye but denies any slurred speech.  She had checked her blood pressure and had MRI of brain last year which was normal.  She like to see if stopping Tegretol makes her symptoms go away.  And she agreed that if it did not help then she may consider seeing neurology.  I agree with the plan.  She recalls never been on mood stabilizer until she was admitted and since her thinking is clear and taking Trilafon and Invega may not need a mood stabilizer.  I will discontinue Tegretol.  Continue Trilafon 16 mg at bedtime and Invega 6 mg at bedtime and change lorazepam to 0.5 mg twice a day  as needed.  I discussed benzodiazepine dependence tolerance and withdrawal.  I explained that if symptoms stop after discontinuing Tegretol then she may not need needed lorazepam twice a day.  She agreed with the plan.  I also encouraged to talk to her therapist about her treatment plan as she is in charge of her treatment plan.  She agreed with that suggestion.  Recommended to call us back if she has any questions or any concerns.  Follow-up in 4 weeks.  Time spent 20 minutes.  Follow Up Instructions:    I discussed the assessment and treatment plan with the patient. The patient was provided an opportunity to ask questions and all were answered. The patient agreed with the plan and demonstrated an understanding of the instructions.   The patient was advised to call back or seek an in-person evaluation if the symptoms worsen or if the condition fails to improve as anticipated.  I provided 20 minutes of non-face-to-face time during this encounter.   Cleotis Nipper, MD

## 2020-04-18 ENCOUNTER — Ambulatory Visit (HOSPITAL_COMMUNITY): Payer: Commercial Managed Care - PPO | Admitting: Licensed Clinical Social Worker

## 2020-05-12 ENCOUNTER — Telehealth (INDEPENDENT_AMBULATORY_CARE_PROVIDER_SITE_OTHER): Payer: Commercial Managed Care - PPO | Admitting: Psychiatry

## 2020-05-12 ENCOUNTER — Other Ambulatory Visit: Payer: Self-pay

## 2020-05-12 ENCOUNTER — Encounter (HOSPITAL_COMMUNITY): Payer: Self-pay | Admitting: Psychiatry

## 2020-05-12 VITALS — Wt 173.0 lb

## 2020-05-12 DIAGNOSIS — F250 Schizoaffective disorder, bipolar type: Secondary | ICD-10-CM | POA: Diagnosis not present

## 2020-05-12 DIAGNOSIS — F411 Generalized anxiety disorder: Secondary | ICD-10-CM

## 2020-05-12 MED ORDER — CLONAZEPAM 0.5 MG PO TABS: 0.5000 mg | ORAL_TABLET | Freq: Two times a day (BID) | ORAL | 0 refills | Status: DC

## 2020-05-12 MED ORDER — PERPHENAZINE 16 MG PO TABS: 16.0000 mg | ORAL_TABLET | Freq: Every day | ORAL | 0 refills | Status: DC

## 2020-05-12 MED ORDER — LURASIDONE HCL 40 MG PO TABS: 40.0000 mg | ORAL_TABLET | Freq: Every day | ORAL | 0 refills | Status: DC

## 2020-05-12 NOTE — Progress Notes (Signed)
Virtual Visit via Telephone Note  I connected with Lisa [name] on 05/12/20 at  9:00 AM EDT by telephone and verified that I am speaking with the correct person using two identifiers.   I discussed the limitations, risks, security and privacy concerns of performing an evaluation and management service by telephone and the availability of in person appointments. I also discussed with the patient that there may be a patient responsible charge related to this service. The patient expressed understanding and agreed to proceed.   Patient Location: Driving to work Patient Location:Home Office   History of Present Illness: Patient is evaluated by phone session.  Now she is complaining of increased anxiety and depression.  She had stopped Tegretol 3 months ago because she thought it was causing blurry vision but even though she stopped the medicine she still have blurry vision but she described mostly when she has anxiety.  She endorsed increased anxiety, depression, fatigue, lack of motivation to do things.  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  She reported her psychosis paranoia is stable but she struggled with anxiety and depression.  Patient had switch multiple medication either she feels they are not working or having side effects.  She [facility]  She was not happy with Isla Pence.  Though she denies any suicidal thoughts or homicidal thoughts but we anhedonia, sometimes hopelessness we decreased motivation to do things.  Her appetite is okay.  Her energy level is low.   Past Psychiatric History; H/Omultiple inpatient started age 2[place] with ex-boyfriend and took street drugs.H/Oinpatientin [place].Upon arrival admitted at [facility] for a month. Noh/osuicidal attempt. H/Opsychosis,anger,paranoia, bizarre  delusion.[facility] at [facility]. Saw Dr. Jannifer Franklin and then Dr. Rene Kocher briefly. Tried lithium, Risperdal, Haldol, Zyprexa, Geodon, Depakote, Ativan, Latuda, Seroquel,Lamictal, Invega injection, Restoril, Zoloft, Wellbutrin. H/Ononcompliance because of side effects.   Psychiatric Specialty Exam: Physical Exam  Review of Systems  Weight 173 lb (78.5 kg).There is no height or weight on file to calculate BMI.  General Appearance: NA  Eye Contact:  NA  Speech:  Slow  Volume:  Decreased  Mood:  Anxious and Dysphoric  Affect:  NA  Thought Process:  Descriptions of Associations: Intact  Orientation:  Full (Time, Place, and Person)  Thought Content:  Rumination  Suicidal Thoughts:  No  Homicidal Thoughts:  No  Memory:  Immediate;   Good Recent;   Good Remote;   Good  Judgement:  Fair  Insight:  Fair  Psychomotor Activity:  NA  Concentration:  Concentration: Fair and Attention Span: Fair  Recall:  Good  Fund of Knowledge:  Good  Language:  Good  Akathisia:  No  Handed:  Right  AIMS (if indicated):     Assets:  Communication Skills Desire for Improvement Housing Resilience Talents/Skills Transportation  ADL's:  Intact  Cognition:  WNL  Sleep:   fair      Assessment and Plan: Schizoaffective disorder, unspecified type.  Anxiety.  I talked with the patient that she had tried multiple medication in the past but there are times that she is not consistent either due to feel like she having side effects or not working.  She had stopped the Tegretol because she thought it was because of bladder lesion but even though she still have it after stopping the medication.  She does not want to go back to Tegretol  because she feels it did not work.  We talked about mood stabilizer but she recalled lithium and Lamictal did not help.  She also feels that she is having panic attack and Ativan not working.  Now she like to go back on [place] which she recall it did help the depression.   I explained the patient already taking Invega and Trilafon and cannot be added to her antipsychotic medication if she is having struggle with anxiety and depression.  We talked about adding low-dose antidepressant but patient scared as in the past antidepressant made her more manic.  She recalled Wellbutrin and Zoloft did not work.  I recommend to discontinue Invega if she like to try Latuda and keep the Trilafon.  She agreed with the plan.  I also recommend to try Klonopin she had never done before and we will discontinue Ativan.  I explained Klonopin has a longer half-life.  If Latuda did not work then we will consider Abilify.  I also encouraged to continue therapy with online and she feels that is going well.  We discussed safety concerns at any time having active suicidal thoughts or homicidal thought that she need to call 911 or go to local emergency room.  We will start Latuda 40 mg daily as patient recall this does work well, continue Trilafon 16 mg at bedtime and Klonopin 0.5 mg twice a day as needed.  We will discontinue Ativan and Invega.  Follow-up in 4 weeks.  Time spent 20 minutes.  Follow Up Instructions:    I discussed the assessment and treatment plan with the patient. The patient was provided an opportunity to ask questions and all were answered. The patient agreed with the plan and demonstrated an understanding of the instructions.   The patient was advised to call back or seek an in-person evaluation if the symptoms worsen or if the condition fails to improve as anticipated.  I provided 20 minutes of non-face-to-face time during this encounter.   Kathlee Nations, MD

## 2020-06-02 ENCOUNTER — Other Ambulatory Visit (HOSPITAL_COMMUNITY): Payer: Self-pay | Admitting: Psychiatry

## 2020-06-02 DIAGNOSIS — F411 Generalized anxiety disorder: Secondary | ICD-10-CM

## 2020-06-02 DIAGNOSIS — F250 Schizoaffective disorder, bipolar type: Secondary | ICD-10-CM

## 2020-06-07 ENCOUNTER — Other Ambulatory Visit (HOSPITAL_COMMUNITY): Payer: Self-pay | Admitting: *Deleted

## 2020-06-08 ENCOUNTER — Other Ambulatory Visit (HOSPITAL_COMMUNITY): Payer: Self-pay | Admitting: Psychiatry

## 2020-06-08 DIAGNOSIS — F250 Schizoaffective disorder, bipolar type: Secondary | ICD-10-CM

## 2020-06-15 ENCOUNTER — Other Ambulatory Visit (HOSPITAL_COMMUNITY): Payer: Self-pay | Admitting: Psychiatry

## 2020-06-15 DIAGNOSIS — F411 Generalized anxiety disorder: Secondary | ICD-10-CM

## 2020-06-22 ENCOUNTER — Telehealth (INDEPENDENT_AMBULATORY_CARE_PROVIDER_SITE_OTHER): Payer: Commercial Managed Care - PPO | Admitting: Psychiatry

## 2020-06-22 ENCOUNTER — Other Ambulatory Visit: Payer: Self-pay

## 2020-06-22 ENCOUNTER — Encounter (HOSPITAL_COMMUNITY): Payer: Self-pay | Admitting: Psychiatry

## 2020-06-22 DIAGNOSIS — F250 Schizoaffective disorder, bipolar type: Secondary | ICD-10-CM | POA: Diagnosis not present

## 2020-06-22 DIAGNOSIS — F411 Generalized anxiety disorder: Secondary | ICD-10-CM

## 2020-06-22 MED ORDER — LURASIDONE HCL 40 MG PO TABS: ORAL_TABLET | ORAL | 1 refills | Status: DC

## 2020-06-22 MED ORDER — PERPHENAZINE 16 MG PO TABS: 16.0000 mg | ORAL_TABLET | Freq: Every day | ORAL | 1 refills | Status: DC

## 2020-06-22 MED ORDER — CLONAZEPAM 0.5 MG PO TABS: 0.5000 mg | ORAL_TABLET | Freq: Two times a day (BID) | ORAL | 1 refills | Status: DC

## 2020-06-22 NOTE — Progress Notes (Signed)
Virtual Visit via Telephone Note  I connected with Lisa [name] on 06/22/20 at  9:20 AM EDT by telephone and verified that I am speaking with the correct person using two identifiers.  Location: Patient: work Provider: home office   I discussed the limitations, risks, security and privacy concerns of performing an evaluation and management service by telephone and the availability of in person appointments. I also discussed with the patient that there may be a patient responsible charge related to this service. The patient expressed understanding and agreed to proceed.   History of Present Illness: Patient is evaluated by phone session.  On the last visit we started [place] and Klonopin and patient is feeling much better.  She is still have some time vision issues which she described vision distorted and excessive brightness.  In the beginning she felt that it was may be due to Tegretol, increase exercise but since she had stopped the Tegretol symptoms still persist.  She is not sure what causing it.  Overall she feels her thinking is much colder.  She denies any paranoia, hallucination or any major panic attack.  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  She is more relaxed.  She is sleeping at least 8 to 9 hours.  She also decided to see a therapist and she had appointment on August 2.  She has no tremors, shakes or any EPS.  She feels the combination is working well.  She takes Klonopin every day and some days second when she feels very anxious.  She feels her anxiety is under control with the Klonopin.  Her appetite is okay and she reported her weight is unchanged from the past.  Past Psychiatric History; H/Omultiple inpatient started age 2[place]vis[place] with ex-boyfriend and took street drugs.H/Oinpatientin [place].Upon arrival admitted at [facility] for a month. Noh/osuicidal attempt.  H/Opsychosis,anger,paranoia, bizarre delusion.LastinpatientOctober 2020 at [facility]. Saw Dr. Jannifer Franklin and then Dr. Rene Kocher briefly. Tried lithium, Risperdal, Haldol, Zyprexa, Geodon, Depakote, Ativan, Latuda, Seroquel,Lamictal, Invega injection, Restoril, Zoloft, Wellbutrin and Tegretol. H/Ononcompliance because of side effects.     Psychiatric Specialty Exam: Physical Exam  Review of Systems  Weight 173 lb (78.5 kg).There is no height or weight on file to calculate BMI.  General Appearance: NA  Eye Contact:  NA  Speech:  Normal Rate  Volume:  Normal  Mood:  Euthymic  Affect:  NA  Thought Process:  Descriptions of Associations: Intact  Orientation:  Full (Time, Place, and Person)  Thought Content:  Rumination  Suicidal Thoughts:  No  Homicidal Thoughts:  No  Memory:  Immediate;   Good Recent;   Good Remote;   Good  Judgement:  Good  Insight:  Present  Psychomotor Activity:  NA  Concentration:  Concentration: Good and Attention Span: Good  Recall:  Good  Fund of Knowledge:  Good  Language:  Good  Akathisia:  No  Handed:  Right  AIMS (if indicated):     Assets:  Communication Skills Desire for Improvement Housing Resilience Talents/Skills Transportation  ADL's:  Intact  Cognition:  WNL  Sleep:   8-9 hrs      Assessment and Plan: Schizoaffective disorder, bipolar type.  Anxiety.  Patient feels her current medicine is working well.  She denies any major panic attack.  We talked about her vision issues which she described some time distorted and excessive brightness.  I recommend to see neurology because she  had the symptoms in the past and she had stopped Tegretol and other medication believing that these medicines causing the symptoms.  Explained if symptoms still persist then she should consider neurology.  At this time she does not want to change medication since she feels they are working.  She also feels more relaxed since her job description is changed and now  she is working outpatient.  I encouraged to continue therapy as her next appointment is on August 2.  We will continue Latuda 40 mg daily, Trilafon 16 mg at bedtime and Klonopin 0.5 mg twice a day.  Discussed medication side effects and benefits.  Recommended to call us back if she has any question, concern or if she feels worsening of the symptom.  Follow-up in 2 months.  Follow Up Instructions:    I discussed the assessment and treatment plan with the patient. The patient was provided an opportunity to ask questions and all were answered. The patient agreed with the plan and demonstrated an understanding of the instructions.   The patient was advised to call back or seek an in-person evaluation if the symptoms worsen or if the condition fails to improve as anticipated.  I provided 15 minutes of non-face-to-face time during this encounter.   Cleotis Nipper, MD

## 2020-08-01 ENCOUNTER — Telehealth (HOSPITAL_COMMUNITY): Payer: Self-pay | Admitting: *Deleted

## 2020-08-01 NOTE — Telephone Encounter (Signed)
Latuda 40mg  tablet PA approved from 07/28/20 thru 07/26/21. EOC # 07/28/21.

## 2020-08-15 ENCOUNTER — Telehealth (HOSPITAL_COMMUNITY): Payer: Self-pay | Admitting: *Deleted

## 2020-08-15 ENCOUNTER — Encounter (HOSPITAL_COMMUNITY): Payer: Self-pay | Admitting: Psychiatry

## 2020-08-15 ENCOUNTER — Telehealth (INDEPENDENT_AMBULATORY_CARE_PROVIDER_SITE_OTHER): Payer: BC Managed Care – PPO | Admitting: Psychiatry

## 2020-08-15 ENCOUNTER — Other Ambulatory Visit: Payer: Self-pay

## 2020-08-15 DIAGNOSIS — F250 Schizoaffective disorder, bipolar type: Secondary | ICD-10-CM

## 2020-08-15 DIAGNOSIS — F411 Generalized anxiety disorder: Secondary | ICD-10-CM

## 2020-08-15 MED ORDER — LURASIDONE HCL 40 MG PO TABS: ORAL_TABLET | ORAL | 2 refills | Status: DC

## 2020-08-15 MED ORDER — PERPHENAZINE 16 MG PO TABS: 16.0000 mg | ORAL_TABLET | Freq: Every day | ORAL | 2 refills | Status: DC

## 2020-08-15 MED ORDER — CLONAZEPAM 0.5 MG PO TABS: 0.5000 mg | ORAL_TABLET | Freq: Two times a day (BID) | ORAL | 2 refills | Status: DC

## 2020-08-15 NOTE — Progress Notes (Signed)
Virtual Visit via Telephone Note  I connected with Lisa [name] on 08/15/20 at  3:40 PM EDT by telephone and verified that I am speaking with the correct person using two identifiers.  Location: Patient: In car Provider: Home office   I discussed the limitations, risks, security and privacy concerns of performing an evaluation and management service by telephone and the availability of in person appointments. I also discussed with the patient that there may be a patient responsible charge related to this service. The patient expressed understanding and agreed to proceed.   History of Present Illness: Patient is evaluated by phone session.  She is taking Latuda, Trilafon and Klonopin.  She feels her anxiety is somewhat better but she sometimes struggle with depression.  She is in the process of switching her job.  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  She like to do individual therapy and now she is accepted a job at [employer] system which she will start in few days.  She is anxious but also excited about new job.  She is still concerned about vision problem which she described excessive brightness and sometimes it is random and sometimes it is related to excessive coffee, being hungry, being dehydrated or sip of alcohol.  She is not sure what triggered.  We have discontinued Tegretol for above reason but that did not help.  Sometimes she noticed excessive exercise can cause vision problem but not able to find the exact cause.  She had seen neurology last year to rule out MS and I recommend that she should consider neurology for more work-up.  I also suggest that she should see a retina specialist.  She agreed to get her appointment to see neurology at [facility] where she had seen before.  She does not want to change medication because she feels they  are helping.  She has to take Klonopin 0.5 mg twice a day which helps her anxiety and above symptoms.  Her appetite is okay.  Her energy level is fair.  She denies any paranoia, hallucination, suicidal thoughts.  Currently she is not in any relationship but does ask about the effect of medication if she ever get pregnant.  She is living with her parents.  She has no tremors shakes or any EPS.  She is seeing a therapist that is helping her coping skills.  Past Psychiatric History; H/Omultiple inpatient started age 68[place] with ex-boyfriend and took street drugs.H/Oinpatientin [place].Upon arrival admitted at [facility] for a month. Noh/osuicidal attempt. H/Opsychosis,anger,paranoia, bizarre delusion.LastinpatientOctober [facility] at [facility]. Saw Dr. Jannifer Franklin and then Dr. Rene Kocher briefly. Tried lithium, Risperdal, Haldol, Zyprexa, Geodon, Depakote, Ativan, Latuda, Seroquel,Lamictal, Invega injection, Restoril, Zoloft, Wellbutrin and Tegretol.H/Ononcompliance because of side effects.     Psychiatric Specialty Exam: Physical Exam  Review of Systems  Weight 170 lb (77.1 kg).There is no height or weight on file to calculate BMI.  General Appearance: NA  Eye Contact:  NA  Speech:  Normal Rate  Volume:  Normal  Mood:  Anxious  Affect:  NA  Thought Process:  Goal Directed  Orientation:  Full (Time, Place, and Person)  Thought Content:  Rumination  Suicidal Thoughts:  No  Homicidal Thoughts:  No  Memory:  Immediate;   Good Recent;   Good Remote;   Good  Judgement:  Intact  Insight:  Present  Psychomotor Activity:  NA  Concentration:  Concentration: Fair and Attention Span: Fair  Recall:  Good  Fund of Knowledge:  Good  Language:  Good  Akathisia:  No  Handed:  Right  AIMS (if indicated):     Assets:  Communication Skills Desire for Improvement Housing Resilience Social Support Talents/Skills Transportation  ADL's:  Intact  Cognition:  WNL   Sleep:         Assessment and Plan: Schizoaffective disorder, bipolar type.  Anxiety.  Patient is doing better than before.  She feel the Klonopin is helping.  She also in therapy with Elenore Paddy and hoping to start a new job with [employer].  After some discussion she agreed to see a neurologist for vision issues.  She does not want to change the medication.  I will continue Latuda 40 mg daily, Trilafon 16 mg at bedtime and Klonopin 0.5 mg twice a day.  Discussed medication side effects and benefits.  Recommended to call us back if she has any question or any concern.  Follow-up in 3 months.    Follow Up Instructions:    I discussed the assessment and treatment plan with the patient. The patient was provided an opportunity to ask questions and all were answered. The patient agreed with the plan and demonstrated an understanding of the instructions.   The patient was advised to call back or seek an in-person evaluation if the symptoms worsen or if the condition fails to improve as anticipated.  I provided 24 minutes of non-face-to-face time during this encounter.   Cleotis Nipper, MD

## 2020-08-22 NOTE — Telephone Encounter (Signed)
Opened in error

## 2020-08-23 ENCOUNTER — Telehealth (HOSPITAL_COMMUNITY): Payer: Commercial Managed Care - PPO | Admitting: Psychiatry

## 2020-09-14 ENCOUNTER — Other Ambulatory Visit: Payer: Self-pay

## 2020-09-14 ENCOUNTER — Telehealth (HOSPITAL_COMMUNITY): Payer: Self-pay | Admitting: Psychiatry

## 2020-09-14 ENCOUNTER — Telehealth (INDEPENDENT_AMBULATORY_CARE_PROVIDER_SITE_OTHER): Payer: Self-pay | Admitting: Psychiatry

## 2020-09-14 DIAGNOSIS — F250 Schizoaffective disorder, bipolar type: Secondary | ICD-10-CM

## 2020-09-14 DIAGNOSIS — F411 Generalized anxiety disorder: Secondary | ICD-10-CM

## 2020-09-14 MED ORDER — CLONAZEPAM 0.5 MG PO TABS: 0.5000 mg | ORAL_TABLET | Freq: Three times a day (TID) | ORAL | 0 refills | Status: DC | PRN

## 2020-09-14 MED ORDER — PERPHENAZINE 4 MG PO TABS: 4.0000 mg | ORAL_TABLET | Freq: Every day | ORAL | 0 refills | Status: DC

## 2020-09-14 MED ORDER — LURASIDONE HCL 60 MG PO TABS: ORAL_TABLET | ORAL | 0 refills | Status: DC

## 2020-09-14 NOTE — Progress Notes (Signed)
Virtual Visit via Telephone Note  I connected with Lisa [name] on 09/14/20 at  3:20 PM EDT by telephone and verified that I am speaking with the correct person using two identifiers.  Location: Patient: In Car Provider: Home Office   I discussed the limitations, risks, security and privacy concerns of performing an evaluation and management service by telephone and the availability of in person appointments. I also discussed with the patient that there may be a patient responsible charge related to this service. The patient expressed understanding and agreed to proceed.   History of Present Illness: Patient requested an earlier appointment.  She started new job but she continues to have panic attack and blurry vision.  Not she wanted to come off from Trilafon because she feels that may be causing the blurry vision.  In the past she had her psychotropic medication for the same reason.  She had taken Invega and then Tegretol and felt that that may be causing the symptoms.  However down she feels the Trilafon may be causing it.  She does not want to lose job because she liked the job but she feels having difficulty when she has blurry vision and panic attacks.  She is taking Klonopin more than twice some days to help these panic attacks.  She describes these panic attacks and blurry vision frequently and we have recommended to see neurologist and retina specialist.  She has appointment to see Dr. Anne Hahn on November 20.  She described these blurry vision does not interfere with driving but she is scared with these episodes.  She denies any paranoia, hallucination or any suicidal thoughts.  She is living with her parents.  She denies any highs and lows or any severe mood swings.  She is compliant with Latuda 40 mg.  Her sleep is okay.  Her appetite is okay.     Past Psychiatric History; H/Omultiple inpatient started age 4[place] with ex-boyfriend and took street  drugs.H/[place]inpatientin [place].Upon arrival admitted at [facility] for a month. Noh/osuicidal attempt. H/Opsychosis,anger,paranoia, bizarre delusion.LastinpatientOctober 2020 at [facility]. Saw Dr. Jannifer Franklin and then Dr. Rene Kocher briefly. Tried lithium, Risperdal, Haldol, Zyprexa, Geodon, Depakote, Ativan, Latuda, Seroquel,Lamictal, Invega injection, Restoril, Zoloft, Wellbutrinand Tegretol.H/Ononcompliance because of side effects.  Psychiatric Specialty Exam: Physical Exam  Review of Systems  There were no vitals taken for this visit.There is no height or weight on file to calculate BMI.  General Appearance: NA  Eye Contact:  NA  Speech:  Normal Rate  Volume:  Normal  Mood:  Anxious  Affect:  NA  Thought Process:  Descriptions of Associations: Intact  Orientation:  Full (Time, Place, and Person)  Thought Content:  Rumination  Suicidal Thoughts:  No  Homicidal Thoughts:  No  Memory:  Immediate;   Good Recent;   Good Remote;   Good  Judgement:  Intact  Insight:  Present  Psychomotor Activity:  NA  Concentration:  Concentration: Fair and Attention Span: Fair  Recall:  Good  Fund of Knowledge:  Good  Language:  Good  Akathisia:  No  Handed:  Right  AIMS (if indicated):     Assets:  Communication Skills Desire for Improvement Housing Resilience Social Support Talents/Skills  ADL's:  Intact  Cognition:  WNL  Sleep:   ok     Assessment and Plan: Shizoaffective disorder, bipolar type.  Anxiety.  Patient is started new job and she feels her panic attacks and blurred vision is increased and now she wanted to come off from  Trilafon.  We have the discussion in the past to see neurologist and not she had appointment on November 20.  I encouraged to keep that appointment for neurology work-up.  I will forward my note to Dr. Anne Hahn.  As she had tried switching the medication believing that it is medication side effects but that did not resolve.  However I am agree  to cut down the Trilafon dose from 16 mg a day to 4 mg a day and increase Latuda to 60 mg daily.  She like to have increased Klonopin since she having more panic attack.  We discussed benzodiazepine dependence tolerance and withdrawal.  We will increase Klonopin 0.5 mg twice a day and third as needed when she has having severe panic attack however I recommend to go back on original dose of 0.5 mg twice a day once her panic attack gets better and blurred vision resolved.  Encouraged to keep appointment with Maisie Fus clear for therapy.  She is in process of new insurance and we will provide Latuda 60 mg samples.  Follow-up in 4 weeks.    Follow Up Instructions:    I discussed the assessment and treatment plan with the patient. The patient was provided an opportunity to ask questions and all were answered. The patient agreed with the plan and demonstrated an understanding of the instructions.   The patient was advised to call back or seek an in-person evaluation if the symptoms worsen or if the condition fails to improve as anticipated.  I provided 21 minutes of non-face-to-face time during this encounter.   Cleotis Nipper, MD

## 2020-09-15 ENCOUNTER — Other Ambulatory Visit (HOSPITAL_COMMUNITY): Payer: Self-pay | Admitting: *Deleted

## 2020-09-15 DIAGNOSIS — F411 Generalized anxiety disorder: Secondary | ICD-10-CM

## 2020-09-15 DIAGNOSIS — F250 Schizoaffective disorder, bipolar type: Secondary | ICD-10-CM

## 2020-09-15 MED ORDER — LATUDA 60 MG PO TABS: ORAL_TABLET | ORAL | 0 refills | Status: DC

## 2020-10-09 ENCOUNTER — Other Ambulatory Visit (HOSPITAL_COMMUNITY): Payer: Self-pay | Admitting: Psychiatry

## 2020-10-09 DIAGNOSIS — F250 Schizoaffective disorder, bipolar type: Secondary | ICD-10-CM

## 2020-10-11 ENCOUNTER — Other Ambulatory Visit (HOSPITAL_COMMUNITY): Payer: Self-pay | Admitting: *Deleted

## 2020-10-11 DIAGNOSIS — F250 Schizoaffective disorder, bipolar type: Secondary | ICD-10-CM

## 2020-10-11 DIAGNOSIS — F411 Generalized anxiety disorder: Secondary | ICD-10-CM

## 2020-10-11 MED ORDER — LATUDA 60 MG PO TABS: ORAL_TABLET | ORAL | 0 refills | Status: DC

## 2020-10-13 ENCOUNTER — Encounter (HOSPITAL_COMMUNITY): Payer: Self-pay | Admitting: Psychiatry

## 2020-10-13 ENCOUNTER — Telehealth (HOSPITAL_COMMUNITY): Payer: Self-pay | Admitting: Psychiatry

## 2020-10-13 ENCOUNTER — Telehealth (INDEPENDENT_AMBULATORY_CARE_PROVIDER_SITE_OTHER): Payer: Self-pay | Admitting: Psychiatry

## 2020-10-13 ENCOUNTER — Other Ambulatory Visit: Payer: Self-pay

## 2020-10-13 DIAGNOSIS — F250 Schizoaffective disorder, bipolar type: Secondary | ICD-10-CM

## 2020-10-13 DIAGNOSIS — F411 Generalized anxiety disorder: Secondary | ICD-10-CM

## 2020-10-13 MED ORDER — PERPHENAZINE 4 MG PO TABS: 4.0000 mg | ORAL_TABLET | Freq: Every day | ORAL | 1 refills | Status: DC

## 2020-10-13 MED ORDER — LATUDA 60 MG PO TABS: ORAL_TABLET | ORAL | 0 refills | Status: DC

## 2020-10-13 MED ORDER — CLONAZEPAM 0.5 MG PO TABS: 0.5000 mg | ORAL_TABLET | Freq: Three times a day (TID) | ORAL | 0 refills | Status: DC | PRN

## 2020-10-13 NOTE — Progress Notes (Signed)
Virtual Visit via Telephone Note  I connected with Lisa [name] on 10/13/20 at  3:40 PM EST by telephone and verified that I am speaking with the correct person using two identifiers.  Location: Patient: Home Provider: Home office   I discussed the limitations, risks, security and privacy concerns of performing an evaluation and management service by telephone and the availability of in person appointments. I also discussed with the patient that there may be a patient responsible charge related to this service. The patient expressed understanding and agreed to proceed.   History of Present Illness: Patient is evaluated by phone session.  On the last visit we cut down her Trilafon, increasing Latuda and also increase the Klonopin.  She is doing much better.  She still have blurry vision but only when she do exercise.  She planning to keep the appointment with her neurologist on November 20.  She denies any hallucination, paranoia or any suicidal thoughts.  Her job is going well but she is still waiting for her insurance.  Her energy level is okay.  She takes Klonopin but a day at least and sometimes second if needed.  She denies any mania, crying spells or any feeling of hopelessness.  She denies any major panic attack.   Past Psychiatric History; H/Omultiple inpatient started age 54[place] with ex-boyfriend and took street drugs.H/Oinpatientin [place].Upon arrival admitted at [facility] for a month. Noh/osuicidal attempt. H/Opsychosis,anger,paranoia, bizarre delusion.[facility]October [facility] at [facility]. Saw Dr. Jannifer Franklin and then Dr. Rene Kocher briefly. Tried lithium, Risperdal, Haldol, Zyprexa, Geodon, Depakote, Ativan, Latuda, Seroquel,Lamictal, Invega injection, Restoril, Zoloft, Wellbutrin   Psychiatric Specialty Exam: Physical Exam  Review of Systems  There were no vitals taken for this visit.There is no height or weight on file to calculate  BMI.  General Appearance: NA  Eye Contact:  NA  Speech:  Clear and Coherent  Volume:  Normal  Mood:  Euthymic  Affect:  NA  Thought Process:  Goal Directed  Orientation:  Full (Time, Place, and Person)  Thought Content:  Logical  Suicidal Thoughts:  No  Homicidal Thoughts:  No  Memory:  Immediate;   Good Recent;   Good Remote;   Good  Judgement:  Good  Insight:  Good  Psychomotor Activity:  NA  Concentration:  Concentration: Good and Attention Span: Good  Recall:  Good  Fund of Knowledge:  Good  Language:  Good  Akathisia:  No  Handed:  Right  AIMS (if indicated):     Assets:  Communication Skills Desire for Improvement Housing Resilience Social Support Talents/Skills Transportation  ADL's:  Intact  Cognition:  WNL  Sleep:   ok      Assessment and Plan: Schizoaffective disorder, bipolar type.  Anxiety.  Patient doing better with medication adjustment.  She is taking Latuda 60 mg and cut down Trilafon and noticed improvement in her blurry vision.  However she like to follow-up with her neurologist appointment because she continues to have blurry vision when she does exercise.  We agree to keep the current medication however in the future we may consider cutting down the Trilafon and if needed increase the Latuda higher dose.  She agreed with the plan.  She may need samples of Latuda 60 mg until her insurance kicks in.  Recommended to call us back if she has any question or any concern.  Follow-up in 2 months.  Follow Up Instructions:    I discussed the assessment and treatment plan with the patient. The patient  was provided an opportunity to ask questions and all were answered. The patient agreed with the plan and demonstrated an understanding of the instructions.   The patient was advised to call back or seek an in-person evaluation if the symptoms worsen or if the condition fails to improve as anticipated.  I provided 15 minutes of non-face-to-face time during this  encounter.   Cleotis Nipper, MD

## 2020-10-20 ENCOUNTER — Encounter: Payer: Self-pay | Admitting: Emergency Medicine

## 2020-10-24 ENCOUNTER — Other Ambulatory Visit: Payer: Self-pay

## 2020-10-24 ENCOUNTER — Encounter: Payer: Self-pay | Admitting: Neurology

## 2020-10-24 ENCOUNTER — Ambulatory Visit: Payer: No Typology Code available for payment source | Admitting: Neurology

## 2020-10-24 DIAGNOSIS — H5310 Unspecified subjective visual disturbances: Secondary | ICD-10-CM | POA: Diagnosis not present

## 2020-10-24 DIAGNOSIS — G43019 Migraine without aura, intractable, without status migrainosus: Secondary | ICD-10-CM

## 2020-10-24 HISTORY — DX: Migraine without aura, intractable, without status migrainosus: G43.019

## 2020-10-24 MED ORDER — RIZATRIPTAN BENZOATE 10 MG PO TABS: 10.0000 mg | ORAL_TABLET | Freq: Three times a day (TID) | ORAL | 3 refills | Status: DC | PRN

## 2020-10-24 MED ORDER — LEVONORGEST-ETH ESTRAD 91-DAY 0.15-0.03 MG PO TABS: 1.0000 | ORAL_TABLET | Freq: Every day | ORAL | 4 refills | Status: AC

## 2020-10-24 MED ORDER — PROPRANOLOL HCL 20 MG PO TABS: 20.0000 mg | ORAL_TABLET | Freq: Two times a day (BID) | ORAL | 3 refills | Status: DC

## 2020-10-24 NOTE — Progress Notes (Signed)
Reason for visit: Light sensitivity, headache  Referring physician: Dr. Sadie Haber is a 29 y.o. female  History of present illness:  Ms. Wessells is a 29 year old left-handed white female with a history of bipolar disorder and a history of a panic disorder.  The patient comes to the office today with a 1 year history of some problems with light sensitivity.  She has noted that fluorescent lights in particular are associated with a heightened sensitivity to light, she has noted that coffee, alcohol, missing a meal, dehydration, and exercise may also do the same thing.  She has stopped caffeine intake and alcohol intake.  Initially, the episodes were daily in nature may last all day long, usually activated while at work when exposed to fluorescent light.  The patient has had a reduction in frequency, she is having 2 or 3 episodes a week, and the episodes may last several hours and not all day.  The perphenazine dose was reduced, she believes that this has helped some.  The patient has been seen through her optometrist, no ocular issues were noted.  The patient reports some distortion of movement during the episodes of light sensitivity.  She does not have any scotoma or flashing lights.  The patient also has headaches that are mainly retro-orbital in nature, these may occur once or twice a week, sometimes she has to leave work early.  Episodes will last about 2 hours and may be associated with some queasiness but no actual vomiting.  She reports that during the episodes of light sensitivity, she will also have some phonophobia as well.  She denies any family history of migraine headache.  She reports no focal numbness or weakness of the face, arms, legs.  She does have some decreased ability to concentrate and focus during the headache.  She denies issues with balance or difficulty controlling the bowels or the bladder.  She is sent to this office for an evaluation.  Past Medical History:    Diagnosis Date  . Anger   . Anxiety   . Bizarre delusion (HCC)   . Paranoia (HCC)   . Psychosis [facility])     Past Surgical History:  Procedure Laterality Date  . extraction of wisdom teeth      Family History  Problem Relation Age of Onset  . Schizophrenia Mother   . Depression Father   . Seizures Sister     Social history:  reports that she quit smoking about 5 years ago. Her smoking use included cigarettes. She has a 0.13 pack-year smoking history. She has never used smokeless tobacco. She reports current alcohol use. She reports that she does not use drugs.  Medications:  Prior to Admission medications   Medication Sig Start Date End Date Taking? Authorizing Provider  clonazePAM (KLONOPIN) 0.5 MG tablet Take 1 tablet (0.5 mg total) by mouth 3 (three) times daily as needed for anxiety. 10/13/20 10/13/21 Yes Arfeen, Phillips Grout, MD  Lurasidone HCl (LATUDA) 60 MG TABS TAKE 1 TABLET (60 MG TOTAL) BY MOUTH DAILY WITH BREAKFAST. 10/13/20  Yes Arfeen, Phillips Grout, MD  perphenazine (TRILAFON) 4 MG tablet Take 1 tablet (4 mg total) by mouth at bedtime. 10/13/20  Yes Arfeen, Phillips Grout, MD  levonorgestrel (MIRENA) 20 MCG/24HR IUD 1 each by Intrauterine route once.    [provider]  neomycin-bacitracin-polymyxin (NEOSPORIN) OINT Apply 1 application topically as needed for wound care. 10/07/19   Sanjuana Kava, NP      Allergies  Allergen Reactions  . Artane [Trihexyphenidyl] Anaphylaxis  . Ambien [Zolpidem] Other (See Comments)    Sleep walking  . Other Other (See Comments)    Nuts - food poisoning effect, makes sick on stomach     ROS:  Out of a complete 14 system review of symptoms, the patient complains only of the following symptoms, and all other reviewed systems are negative.  Headache Light sensitivity  Blood pressure 131/85, pulse 91, height 5\' 6"  (1.676 m), weight 175 lb 8 oz (79.6 kg), last menstrual period 10/17/2020.  Physical Exam  General: The patient is alert  and cooperative at the time of the examination.  Eyes: Pupils are equal, round, and reactive to light. Discs are flat bilaterally.  Neck: The neck is supple, no carotid bruits are noted.  Respiratory: The respiratory examination is clear.  Cardiovascular: The cardiovascular examination reveals a regular rate and rhythm, no obvious murmurs or rubs are noted.  Skin: Extremities are without significant edema.  Neurologic Exam  Mental status: The patient is alert and oriented x 3 at the time of the examination. The patient has apparent normal recent and remote memory, with an apparently normal attention span and concentration ability.  Cranial nerves: Facial symmetry is present. There is good sensation of the face to pinprick and soft touch bilaterally. The strength of the facial muscles and the muscles to head turning and shoulder shrug are normal bilaterally. Speech is well enunciated, no aphasia or dysarthria is noted. Extraocular movements are full. Visual fields are full. The tongue is midline, and the patient has symmetric elevation of the soft palate. No obvious hearing deficits are noted.  Motor: The motor testing reveals 5 over 5 strength of all 4 extremities. Good symmetric motor tone is noted throughout.  Sensory: Sensory testing is intact to pinprick, soft touch, vibration sensation, and position sense on all 4 extremities. No evidence of extinction is noted.  Coordination: Cerebellar testing reveals good finger-nose-finger and heel-to-shin bilaterally.  Gait and station: Gait is normal. Tandem gait is normal. Romberg is negative. No drift is seen.  Reflexes: Deep tendon reflexes are symmetric and normal bilaterally. Toes are downgoing bilaterally.   Assessment/Plan:  1.  Migraine headache, intractable  2.  Episodic photophobia  3.  Anxiety, panic disorder  The patient is describing visual events that may be associated with migraine.  She may be able to reduce the  frequency of events by controlling her work environment by limiting the fluorescent light exposure and using a desk lamp.  The patient will be placed on propranolol taking 20 mg twice daily, she may call in about 3 weeks for any dose adjustments.  She is to watch out for fatigue and increasing depression on the medication.  The propranolol may help her underlying anxiety issue.  The patient will follow up in about 4 months.  She will be given Maxalt to take if needed for her headache.  10/19/2020 MD 10/24/2020 8:17 AM  [facility] [address] [place], [place]  Phone [phone] Fax [phone]

## 2020-11-06 ENCOUNTER — Other Ambulatory Visit (HOSPITAL_COMMUNITY): Payer: Self-pay | Admitting: Psychiatry

## 2020-11-06 DIAGNOSIS — F411 Generalized anxiety disorder: Secondary | ICD-10-CM

## 2020-11-06 DIAGNOSIS — F250 Schizoaffective disorder, bipolar type: Secondary | ICD-10-CM

## 2020-11-14 ENCOUNTER — Telehealth (HOSPITAL_COMMUNITY): Payer: BC Managed Care – PPO | Admitting: Psychiatry

## 2020-12-05 ENCOUNTER — Other Ambulatory Visit (HOSPITAL_COMMUNITY): Payer: Self-pay | Admitting: Psychiatry

## 2020-12-05 DIAGNOSIS — F250 Schizoaffective disorder, bipolar type: Secondary | ICD-10-CM

## 2020-12-13 ENCOUNTER — Telehealth (INDEPENDENT_AMBULATORY_CARE_PROVIDER_SITE_OTHER): Payer: Self-pay | Admitting: Psychiatry

## 2020-12-13 ENCOUNTER — Other Ambulatory Visit: Payer: Self-pay

## 2020-12-13 ENCOUNTER — Encounter (HOSPITAL_COMMUNITY): Payer: Self-pay | Admitting: Psychiatry

## 2020-12-13 VITALS — Wt 178.0 lb

## 2020-12-13 DIAGNOSIS — F411 Generalized anxiety disorder: Secondary | ICD-10-CM

## 2020-12-13 DIAGNOSIS — F250 Schizoaffective disorder, bipolar type: Secondary | ICD-10-CM

## 2020-12-13 MED ORDER — LURASIDONE HCL 80 MG PO TABS: 80.0000 mg | ORAL_TABLET | Freq: Every day | ORAL | 2 refills | Status: DC

## 2020-12-13 MED ORDER — CLONAZEPAM 0.5 MG PO TABS: 0.5000 mg | ORAL_TABLET | Freq: Two times a day (BID) | ORAL | 0 refills | Status: DC | PRN

## 2020-12-13 NOTE — Progress Notes (Signed)
Virtual Visit via Telephone Note  I connected with Lisa [name] on 12/13/20 at  3:40 PM EST by telephone and verified that I am speaking with the correct person using two identifiers.  Location: Patient: Work Provider: Economist   I discussed the limitations, risks, security and privacy concerns of performing an evaluation and management service by telephone and the availability of in person appointments. I also discussed with the patient that there may be a patient responsible charge related to this service. The patient expressed understanding and agreed to proceed.   History of Present Illness: Patient is evaluated by phone session.  She had a good Christmas.  She is now moved out from her parent's house and living on her own in [place].  Her job is going well.  She recently seen neurologist who is now treating her blurry vision as a migraine and started her on propranolol twice a day.  So far she has not seen a huge improvement as she continues to have blurry vision and anxiety and of the day.  She takes Klonopin every day that helps her anxiety and panic attack.  She is sleeping good.  Her weight is stable.  She denies any mania, psychosis, hallucination.  Really level is okay.  She feels that Kasandra Knudsen is working very well and now she is willing to try higher dose of Latuda and stopping the Trilafon.  She has no tremors, shakes or any EPS.   Past Psychiatric History; H/Omultiple inpatient started age 82[place] with ex-boyfriend and took street drugs.H/Oinpatientin [place].Upon arrival admitted at [facility] for a month. Noh/osuicidal attempt. H/Opsychosis,anger,paranoia, bizarre delusion.Last[facility]patientOctober [facility] at [facility] Saw Dr. Jannifer Franklin and then Dr. Rene Kocher briefly. Tried lithium, Risperdal, Haldol, Zyprexa, Geodon, Depakote, Ativan, Latuda, Seroquel,Lamictal, Invega injection, Restoril, Zoloft, Wellbutrin     Psychiatric Specialty  Exam: Physical Exam  Review of Systems  Weight 178 lb (80.7 kg).There is no height or weight on file to calculate BMI.  General Appearance: NA  Eye Contact:  NA  Speech:  Clear and Coherent and Normal Rate  Volume:  Normal  Mood:  Euthymic  Affect:  NA  Thought Process:  Goal Directed  Orientation:  Full (Time, Place, and Person)  Thought Content:  Logical  Suicidal Thoughts:  No  Homicidal Thoughts:  No  Memory:  Immediate;   Good Recent;   Good Remote;   Good  Judgement:  Intact  Insight:  Present  Psychomotor Activity:  NA  Concentration:  Concentration: Good and Attention Span: Good  Recall:  Good  Fund of Knowledge:  Good  Language:  Good  Akathisia:  No  Handed:  Right  AIMS (if indicated):     Assets:  Communication Skills Desire for Improvement Housing Resilience Social Support Talents/Skills Transportation  ADL's:  Intact  Cognition:  WNL  Sleep:   ok      Assessment and Plan: Schizoaffective disorder, bipolar type.  Anxiety.  I reviewed notes and current medication.  She is willing to try Latuda 80 mg and discontinue Trilafon to see if that works and helps her blurry vision.  Discussed medication side effects and benefits.  She is continuing Inderal to see if that helps her blurry vision.  Recommended to call us back if she has any question or any concerns.  Follow-up in 3 months.  Follow Up Instructions:    I discussed the assessment and treatment plan with the patient. The patient was provided an opportunity to ask questions and all  were answered. The patient agreed with the plan and demonstrated an understanding of the instructions.   The patient was advised to call back or seek an in-person evaluation if the symptoms worsen or if the condition fails to improve as anticipated.  I provided 18 minutes of non-face-to-face time during this encounter.   Cleotis Nipper, MD

## 2021-01-11 ENCOUNTER — Telehealth (HOSPITAL_COMMUNITY): Payer: Self-pay | Admitting: *Deleted

## 2021-01-11 DIAGNOSIS — F411 Generalized anxiety disorder: Secondary | ICD-10-CM

## 2021-01-11 NOTE — Telephone Encounter (Signed)
Pt called requesting refills on Latuda and prn Klonopin. Pt next appointment is on 03/14/21. Please review.

## 2021-01-12 MED ORDER — CLONAZEPAM 0.5 MG PO TABS: 0.5000 mg | ORAL_TABLET | Freq: Two times a day (BID) | ORAL | 0 refills | Status: DC | PRN

## 2021-01-12 NOTE — Telephone Encounter (Signed)
Latuda given on 12/13/20 with two refills. Klonopin called in to [facility].

## 2021-01-16 ENCOUNTER — Other Ambulatory Visit: Payer: Self-pay | Admitting: Neurology

## 2021-02-20 ENCOUNTER — Telehealth (HOSPITAL_COMMUNITY): Payer: Self-pay | Admitting: *Deleted

## 2021-02-20 DIAGNOSIS — F411 Generalized anxiety disorder: Secondary | ICD-10-CM

## 2021-02-20 MED ORDER — CLONAZEPAM 0.5 MG PO TABS: 0.5000 mg | ORAL_TABLET | Freq: Two times a day (BID) | ORAL | 0 refills | Status: DC | PRN

## 2021-02-20 NOTE — Telephone Encounter (Signed)
Erroneous encounter

## 2021-02-28 ENCOUNTER — Ambulatory Visit: Payer: No Typology Code available for payment source | Admitting: Neurology

## 2021-02-28 NOTE — Progress Notes (Deleted)
PATIENT: Lisa [name] DOB: [birth year]  REASON FOR VISIT: follow up HISTORY FROM: patient  HISTORY OF PRESENT ILLNESS: Today 02/28/21 Richard Holz is a 30 year old female with history of bipolar disorder, panic disorder, and light sensitivity.  The light sensitivity is felt to be part of a migraine syndrome.  At last visit was placed on propanolol. HISTORY 10/24/2020 Dr. Anne Hahn: Ms. Kerce is a 30 year old left-handed white female with a history of bipolar disorder and a history of a panic disorder.  The patient comes to the office today with a 1 year history of some problems with light sensitivity.  She has noted that fluorescent lights in particular are associated with a heightened sensitivity to light, she has noted that coffee, alcohol, missing a meal, dehydration, and exercise may also do the same thing.  She has stopped caffeine intake and alcohol intake.  Initially, the episodes were daily in nature may last all day long, usually activated while at work when exposed to fluorescent light.  The patient has had a reduction in frequency, she is having 2 or 3 episodes a week, and the episodes may last several hours and not all day.  The perphenazine dose was reduced, she believes that this has helped some.  The patient has been seen through her optometrist, no ocular issues were noted.  The patient reports some distortion of movement during the episodes of light sensitivity.  She does not have any scotoma or flashing lights.  The patient also has headaches that are mainly retro-orbital in nature, these may occur once or twice a week, sometimes she has to leave work early.  Episodes will last about 2 hours and may be associated with some queasiness but no actual vomiting.  She reports that during the episodes of light sensitivity, she will also have some phonophobia as well.  She denies any family history of migraine headache.  She reports no focal numbness or weakness of the face, arms, legs.   She does have some decreased ability to concentrate and focus during the headache.  She denies issues with balance or difficulty controlling the bowels or the bladder.  She is sent to this office for an evaluation.   REVIEW OF SYSTEMS: Out of a complete 14 system review of symptoms, the patient complains only of the following symptoms, and all other reviewed systems are negative.  ALLERGIES: Allergies  Allergen Reactions  . Artane [Trihexyphenidyl] Anaphylaxis  . Ambien [Zolpidem] Other (See Comments)    Sleep walking  . Other Other (See Comments)    Nuts - food poisoning effect, makes sick on stomach     HOME MEDICATIONS: Outpatient Medications Prior to Visit  Medication Sig Dispense Refill  . clonazePAM (KLONOPIN) 0.5 MG tablet Take 1 tablet (0.5 mg total) by mouth 2 (two) times daily as needed for anxiety. 45 tablet 0  . levonorgestrel-ethinyl estradiol (SEASONALE) 0.15-0.03 MG tablet Take 1 tablet by mouth daily. 91 tablet 4  . lurasidone (LATUDA) 80 MG TABS tablet Take 1 tablet (80 mg total) by mouth daily with breakfast. 30 tablet 2  . Lurasidone HCl (LATUDA) 60 MG TABS TAKE 1 TABLET BY MOUTH DAILY WITH BREAKFAST. 30 tablet 1  . perphenazine (TRILAFON) 4 MG tablet Take 1 tablet (4 mg total) by mouth at bedtime. (Patient not taking: Reported on 12/13/2020) 30 tablet 1  . propranolol (INDERAL) 20 MG tablet TAKE 1 TABLET BY MOUTH TWICE A DAY 180 tablet 1  . rizatriptan (MAXALT) 10 MG tablet Take 1 tablet (  10 mg total) by mouth 3 (three) times daily as needed for migraine. 10 tablet 3   No facility-administered medications prior to visit.    PAST MEDICAL HISTORY: Past Medical History:  Diagnosis Date  . Anger   . Anxiety   . Bizarre delusion (HCC)   . Common migraine with intractable migraine 10/24/2020  . Paranoia (HCC)   . Psychosis (HCC)     PAST SURGICAL HISTORY: Past Surgical History:  Procedure Laterality Date  . extraction of wisdom teeth      FAMILY  HISTORY: Family History  Problem Relation Age of Onset  . Schizophrenia Mother   . Depression Father   . Seizures Sister     SOCIAL HISTORY: Social History   Socioeconomic History  . Marital status: Single    Spouse name: Not on file  . Number of children: Not on file  . Years of education: Not on file  . Highest education level: Not on file  Occupational History  . Occupation: full time  Tobacco Use  . Smoking status: Former Smoker    Packs/day: 0.25    Years: 0.50    Pack years: 0.12    Types: Cigarettes    Quit date: 09/03/2015    Years since quitting: 5.4  . Smokeless tobacco: Never Used  Substance and Sexual Activity  . Alcohol use: Yes    Comment: casually  . Drug use: No    Types: Marijuana    Comment: THC last used 2 yrs ago, unsure of amount  . Sexual activity: Not Currently    Comment: Plan B Pill  Other Topics Concern  . Not on file  Social History Narrative   Lives with parents   Left Handed   Drinks no caffeine   Social Determinants of Health   Financial Resource Strain: Not on file  Food Insecurity: Not on file  Transportation Needs: Not on file  Physical Activity: Not on file  Stress: Not on file  Social Connections: Not on file  Intimate Partner Violence: Not on file      PHYSICAL EXAM  There were no vitals filed for this visit. There is no height or weight on file to calculate BMI.  Generalized: Well developed, in no acute distress   Neurological examination  Mentation: Alert oriented to time, place, history taking. Follows all commands speech and language fluent Cranial nerve II-XII: Pupils were equal round reactive to light. Extraocular movements were full, visual field were full on confrontational test. Facial sensation and strength were normal. Uvula tongue midline. Head turning and shoulder shrug  were normal and symmetric. Motor: The motor testing reveals 5 over 5 strength of all 4 extremities. Good symmetric motor tone is noted  throughout.  Sensory: Sensory testing is intact to soft touch on all 4 extremities. No evidence of extinction is noted.  Coordination: Cerebellar testing reveals good finger-nose-finger and heel-to-shin bilaterally.  Gait and station: Gait is normal. Tandem gait is normal. Romberg is negative. No drift is seen.  Reflexes: Deep tendon reflexes are symmetric and normal bilaterally.   DIAGNOSTIC DATA (LABS, IMAGING, TESTING) - I reviewed patient records, labs, notes, testing and imaging myself where available.  Lab Results  Component Value Date   WBC 8.7 09/15/2019   HGB 15.1 (H) 09/15/2019   HCT 45.6 09/15/2019   MCV 92.7 09/15/2019   PLT 309 09/15/2019      Component Value Date/Time   NA 139 09/27/2019 1826   NA 139 08/20/2019 0912   K  4.3 09/27/2019 1826   CL 107 09/27/2019 1826   CO2 24 09/27/2019 1826   GLUCOSE 69 (L) 09/27/2019 1826   BUN 10 09/27/2019 1826   BUN 9 08/20/2019 0912   CREATININE 0.89 09/27/2019 1826   CALCIUM 8.8 (L) 09/27/2019 1826   PROT 6.9 09/27/2019 1826   PROT 7.4 08/20/2019 0912   ALBUMIN 3.8 09/27/2019 1826   ALBUMIN 5.1 (H) 08/20/2019 0912   AST 35 09/27/2019 1826   ALT 32 09/27/2019 1826   ALKPHOS 59 09/27/2019 1826   BILITOT 0.6 09/27/2019 1826   BILITOT 0.5 08/20/2019 0912   GFRNONAA >60 09/27/2019 1826   GFRAA >60 09/27/2019 1826   Lab Results  Component Value Date   CHOL 210 (H) 09/15/2019   HDL 60 09/15/2019   LDLCALC 131 (H) 09/15/2019   TRIG 97 09/15/2019   CHOLHDL 3.5 09/15/2019   Lab Results  Component Value Date   HGBA1C 5.3 09/15/2019   Lab Results  Component Value Date   VITAMINB12 269 08/20/2019   Lab Results  Component Value Date   TSH 1.156 09/15/2019      ASSESSMENT AND PLAN 30 y.o. year old female  has a past medical history of Anger, Anxiety, Bizarre delusion (HCC), Common migraine with intractable migraine (10/24/2020), Paranoia (HCC), and Psychosis (HCC). here with ***   I spent 15 minutes with the  patient. 50% of this time was spent   Margie Ege, [place], [place] 02/28/2021, 5:32 AM [facility] [address] [place], [place] [address] [phone]

## 2021-03-07 NOTE — Progress Notes (Signed)
PATIENT: Lisa [name] DOB: Feb 27, 1991  REASON FOR VISIT: follow up HISTORY FROM: patient  HISTORY OF PRESENT ILLNESS: Today 03/08/21  Lisa [name] is a 30 year old female with history of bipolar disorder, panic disorder, initially seen by Dr. Anne Hahn November 2021 for problems with light sensitivity.  The visual events may be associated with migraine.  She was placed on propanolol.  Takes Maxalt if needed.  Since propanolol, no longer complains of the migraine headaches.  She does however continue to have the light sensitivity, thinks seen extra bright, highlighted.  Often times, will have the light sensitivity, then followed by panic attack if she does not take clonazepam.  For the last year, also reports light sensitivity with high intensity exercise, the light sensitivity does go away with exercise cessation, does not require clonazepam, claims BP is okay.  Has been having these issues since 2020 when she had a mania, psychosis significant episode. Vision problems happen 3-4 days a week. 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   HISTORY 10/24/2020 Dr. Anne Hahn: Ms. January is a 30 year old left-handed white female with a history of bipolar disorder and a history of a panic disorder.  The patient comes to the office today with a 1 year history of some problems with light sensitivity.  She has noted that fluorescent lights in particular are associated with a heightened sensitivity to light, she has noted that coffee, alcohol, missing a meal, dehydration, and exercise may also do the same thing.  She has stopped caffeine intake and alcohol intake.  Initially, the episodes were daily in nature may last all day long, usually activated while at work when exposed to fluorescent light.  The patient has had a reduction in frequency, she is having 2 or 3 episodes a week, and the episodes may last several hours and not all day.  The perphenazine dose was reduced, she believes that this has  helped some.  The patient has been seen through her optometrist, no ocular issues were noted.  The patient reports some distortion of movement during the episodes of light sensitivity.  She does not have any scotoma or flashing lights.  The patient also has headaches that are mainly retro-orbital in nature, these may occur once or twice a week, sometimes she has to leave work early.  Episodes will last about 2 hours and may be associated with some queasiness but no actual vomiting.  She reports that during the episodes of light sensitivity, she will also have some phonophobia as well.  She denies any family history of migraine headache.  She reports no focal numbness or weakness of the face, arms, legs.  She does have some decreased ability to concentrate and focus during the headache.  She denies issues with balance or difficulty controlling the bowels or the bladder.  She is sent to this office for an evaluation.   REVIEW OF SYSTEMS: Out of a complete 14 system review of symptoms, the patient complains only of the following symptoms, and all other reviewed systems are negative.  See HPI  ALLERGIES: Allergies  Allergen Reactions  . Artane [Trihexyphenidyl] Anaphylaxis  . Ambien [Zolpidem] Other (See Comments)    Sleep walking  . Other Other (See Comments)    Nuts - food poisoning effect, makes sick on stomach     HOME MEDICATIONS: Outpatient Medications Prior to Visit  Medication Sig Dispense Refill  . clonazePAM (KLONOPIN) 0.5 MG tablet Take 1 tablet (0.5 mg total) by mouth 2 (two) times  daily as needed for anxiety. 45 tablet 0  . levonorgestrel-ethinyl estradiol (SEASONALE) 0.15-0.03 MG tablet Take 1 tablet by mouth daily. 91 tablet 4  . lurasidone (LATUDA) 80 MG TABS tablet Take 1 tablet (80 mg total) by mouth daily with breakfast. 30 tablet 2  . propranolol (INDERAL) 20 MG tablet TAKE 1 TABLET BY MOUTH TWICE A DAY 180 tablet 1  . Lurasidone HCl (LATUDA) 60 MG TABS TAKE 1 TABLET BY MOUTH  DAILY WITH BREAKFAST. 30 tablet 1  . perphenazine (TRILAFON) 4 MG tablet Take 1 tablet (4 mg total) by mouth at bedtime. (Patient not taking: Reported on 12/13/2020) 30 tablet 1  . rizatriptan (MAXALT) 10 MG tablet Take 1 tablet (10 mg total) by mouth 3 (three) times daily as needed for migraine. 10 tablet 3   No facility-administered medications prior to visit.    PAST MEDICAL HISTORY: Past Medical History:  Diagnosis Date  . Anger   . Anxiety   . Bizarre delusion (HCC)   . Common migraine with intractable migraine 10/24/2020  . Paranoia (HCC)   . Psychosis (HCC)     PAST SURGICAL HISTORY: Past Surgical History:  Procedure Laterality Date  . extraction of wisdom teeth      FAMILY HISTORY: Family History  Problem Relation Age of Onset  . Schizophrenia Mother   . Depression Father   . Seizures Sister     SOCIAL HISTORY: Social History   Socioeconomic History  . Marital status: Single    Spouse name: Not on file  . Number of children: Not on file  . Years of education: Not on file  . Highest education level: Not on file  Occupational History  . Occupation: full time  Tobacco Use  . Smoking status: Former Smoker    Packs/day: 0.25    Years: 0.50    Pack years: 0.12    Types: Cigarettes    Quit date: 09/03/2015    Years since quitting: 5.5  . Smokeless tobacco: Never Used  Substance and Sexual Activity  . Alcohol use: Yes    Comment: casually  . Drug use: No    Types: Marijuana    Comment: THC last used 2 yrs ago, unsure of amount  . Sexual activity: Not Currently    Comment: Plan B Pill  Other Topics Concern  . Not on file  Social History Narrative   Lives with parents   Left Handed   Drinks no caffeine   Social Determinants of Health   Financial Resource Strain: Not on file  Food Insecurity: Not on file  Transportation Needs: Not on file  Physical Activity: Not on file  Stress: Not on file  Social Connections: Not on file  Intimate Partner  Violence: Not on file   PHYSICAL EXAM  Vitals:   03/08/21 1507  BP: 119/78  Pulse: 73  Weight: 190 lb (86.2 kg)  Height: 5\' 6"  (1.676 m)   Body mass index is 30.67 kg/m.  Generalized: Well developed, in no acute distress   Neurological examination  Mentation: Alert oriented to time, place, history taking. Follows all commands speech and language fluent Cranial nerve II-XII: Pupils were equal round reactive to light. Extraocular movements were full, visual field were full on confrontational test. Facial sensation and strength were normal. Head turning and shoulder shrug  were normal and symmetric. Motor: The motor testing reveals 5 over 5 strength of all 4 extremities. Good symmetric motor tone is noted throughout.  Sensory: Sensory testing is intact to  soft touch on all 4 extremities. No evidence of extinction is noted.  Coordination: Cerebellar testing reveals good finger-nose-finger and heel-to-shin bilaterally.  Gait and station: Gait is normal. Tandem gait is normal. Romberg is negative. No drift is seen.  Reflexes: Deep tendon reflexes are symmetric and normal bilaterally.   DIAGNOSTIC DATA (LABS, IMAGING, TESTING) - I reviewed patient records, labs, notes, testing and imaging myself where available.  Lab Results  Component Value Date   WBC 8.7 09/15/2019   HGB 15.1 (H) 09/15/2019   HCT 45.6 09/15/2019   MCV 92.7 09/15/2019   PLT 309 09/15/2019      Component Value Date/Time   NA 139 09/27/2019 1826   NA 139 08/20/2019 0912   K 4.3 09/27/2019 1826   CL 107 09/27/2019 1826   CO2 24 09/27/2019 1826   GLUCOSE 69 (L) 09/27/2019 1826   BUN 10 09/27/2019 1826   BUN 9 08/20/2019 0912   CREATININE 0.89 09/27/2019 1826   CALCIUM 8.8 (L) 09/27/2019 1826   PROT 6.9 09/27/2019 1826   PROT 7.4 08/20/2019 0912   ALBUMIN 3.8 09/27/2019 1826   ALBUMIN 5.1 (H) 08/20/2019 0912   AST 35 09/27/2019 1826   ALT 32 09/27/2019 1826   ALKPHOS 59 09/27/2019 1826   BILITOT 0.6  09/27/2019 1826   BILITOT 0.5 08/20/2019 0912   GFRNONAA >60 09/27/2019 1826   GFRAA >60 09/27/2019 1826   Lab Results  Component Value Date   CHOL 210 (H) 09/15/2019   HDL 60 09/15/2019   LDLCALC 131 (H) 09/15/2019   TRIG 97 09/15/2019   CHOLHDL 3.5 09/15/2019   Lab Results  Component Value Date   HGBA1C 5.3 09/15/2019   Lab Results  Component Value Date   VITAMINB12 269 08/20/2019   Lab Results  Component Value Date   TSH 1.156 09/15/2019   ASSESSMENT AND PLAN 30 y.o. year old female  has a past medical history of Anger, Anxiety, Bizarre delusion (HCC), Common migraine with intractable migraine (10/24/2020), Paranoia (HCC), and Psychosis (HCC). here with:  1.  Migraine headache, intractable 2.  Episodic photophobia 3.  Anxiety, panic disorder  -Exam is normal  -No longer reports migraine headache -Episodes of light sensitivity seem either related to anxiety, or part of the migraine syndrome, since continued light sensitivity 3-4 times a week, will try higher dose propanolol 40 mg twice daily -However does report the light sensitivity with high intensity exercise, this is when not anxious, will check CTA head and neck to ensure no vessel abnormality for complete work-up; check creatinine -In September 2020 MRI of the brain with and without contrast was normal, as was cervical spine with and without -Follow-up in 6 months or sooner if needed, will update once scans are available  Orders Placed This Encounter  Procedures  . CT ANGIO HEAD W OR WO CONTRAST  . CT ANGIO NECK W OR WO CONTRAST  . Creatinine, Serum   I spent 30 minutes of face-to-face and non-face-to-face time with patient.  This included previsit chart review, lab review, study review, order entry, electronic health record documentation, patient education.  Margie Ege, AGNP-C, DNP 03/08/2021, 3:32 PM [facility] [address] [place], [place] [address] [phone]

## 2021-03-08 ENCOUNTER — Ambulatory Visit: Payer: No Typology Code available for payment source | Admitting: Neurology

## 2021-03-08 ENCOUNTER — Encounter: Payer: Self-pay | Admitting: Neurology

## 2021-03-08 VITALS — BP 119/78 | HR 73 | Ht 66.0 in | Wt 190.0 lb

## 2021-03-08 DIAGNOSIS — H5310 Unspecified subjective visual disturbances: Secondary | ICD-10-CM

## 2021-03-08 DIAGNOSIS — G43019 Migraine without aura, intractable, without status migrainosus: Secondary | ICD-10-CM | POA: Diagnosis not present

## 2021-03-08 MED ORDER — PROPRANOLOL HCL 40 MG PO TABS: 40.0000 mg | ORAL_TABLET | Freq: Two times a day (BID) | ORAL | 8 refills | Status: AC

## 2021-03-08 NOTE — Progress Notes (Signed)
I have read the note, and I agree with the clinical assessment and plan.  Aymen Widrig K Mahogany Torrance   

## 2021-03-08 NOTE — Patient Instructions (Signed)
Increase the propranolol to 40 mg twice daily Check imaging  Check kidney function today  See you back in 6 months

## 2021-03-09 ENCOUNTER — Telehealth: Payer: Self-pay | Admitting: Neurology

## 2021-03-09 ENCOUNTER — Telehealth: Payer: Self-pay

## 2021-03-09 LAB — CREATININE, SERUM
Creatinine, Ser: 0.86 mg/dL (ref 0.57–1.00)
eGFR: 94 mL/min/{1.73_m2} (ref >59)

## 2021-03-09 NOTE — Telephone Encounter (Signed)
medcost order sent to GI. They will obtain the auth and reach out to the patient to schedule.  

## 2021-03-09 NOTE — Telephone Encounter (Signed)
-----   Message from Glean Salvo, NP sent at 03/09/2021  5:54 AM EDT ----- Sent my chart message: Lisa [name], Kidney function is normal.  Take Care, Maralyn Sago

## 2021-03-14 ENCOUNTER — Encounter (HOSPITAL_COMMUNITY): Payer: Self-pay | Admitting: Psychiatry

## 2021-03-14 ENCOUNTER — Telehealth (INDEPENDENT_AMBULATORY_CARE_PROVIDER_SITE_OTHER): Payer: No Typology Code available for payment source | Admitting: Psychiatry

## 2021-03-14 ENCOUNTER — Other Ambulatory Visit: Payer: Self-pay

## 2021-03-14 DIAGNOSIS — F411 Generalized anxiety disorder: Secondary | ICD-10-CM | POA: Diagnosis not present

## 2021-03-14 DIAGNOSIS — F250 Schizoaffective disorder, bipolar type: Secondary | ICD-10-CM

## 2021-03-14 MED ORDER — CLONAZEPAM 0.5 MG PO TABS: 0.5000 mg | ORAL_TABLET | Freq: Two times a day (BID) | ORAL | 2 refills | Status: DC | PRN

## 2021-03-14 MED ORDER — LURASIDONE HCL 80 MG PO TABS
80.0000 mg | ORAL_TABLET | Freq: Every day | ORAL | 2 refills | Status: AC
Start: 2021-03-14 — End: ?

## 2021-03-14 NOTE — Progress Notes (Signed)
Virtual Visit via Telephone Note  I connected with Lisa [name] on 03/14/21 at  3:40 PM EDT by telephone and verified that I am speaking with the correct person using two identifiers.  Location: Patient: Work Provider: Home office   I discussed the limitations, risks, security and privacy concerns of performing an evaluation and management service by telephone and the availability of in person appointments. I also discussed with the patient that there may be a patient responsible charge related to this service. The patient expressed understanding and agreed to proceed.   History of Present Illness: Patient is evaluated by phone session.  She is taking all her medication as prescribed.  The job is going well but she still have episodes which she described blurred vision and seen recently neurologist and now scheduled to have CT scan of the head.  Patient told these episodes happen 3-4 times a week.  She is taking propanolol which is helping her migraine.  She is also taking Klonopin to prevent post panic attacks as sometimes these episodes triggers the panic attacks.  She endorsed increased weight gain in recent months and not sure if her thyroid is working normal.  She also like to have her thyroid checked and trying to schedule appointment with the physician.  She now have a different Sjogren's and like to move her care in [facility] and she has appointment to see a psychiatrist in June.  She reported her mood is stable and denies any highs and lows, paranoia, delusion, anger, suicidal thoughts.  She has no tremors or shakes.  We have increased Latuda on the last visit and she is no longer taking Trilafon.  She is tolerating Latuda dose without any issues.  She is living with a roommate.  She lives and work in [place] and like to move her care at [facility].  Past Psychiatric History; H[place]multiple inpatient [place] age 59[place] ex-boyfriend and took street  drugs.H/Oinpatientin [place].Upon arrival admitted at [facility] for a [facility]. Noh/osuicidal attempt. H/Opsychosis,anger,paranoia, bizarre delusion.[facility] Saw Dr. Jannifer Franklin and then Dr. Rene Kocher briefly. Tried lithium, Risperdal, Haldol, Zyprexa, Geodon, Depakote, Ativan, Latuda, Seroquel,Lamictal, Invega injection, Restoril, Zoloft, Wellbutrin   Psychiatric Specialty Exam: Physical Exam  Review of Systems  Weight 190 lb (86.2 kg).There is no height or weight on file to calculate BMI.  General Appearance: NA  Eye Contact:  NA  Speech:  Clear and Coherent and Normal Rate  Volume:  Normal  Mood:  Euthymic  Affect:  NA  Thought Process:  Goal Directed  Orientation:  Full (Time, Place, and Person)  Thought Content:  WDL  Suicidal Thoughts:  No  Homicidal Thoughts:  No  Memory:  Immediate;   Good Recent;   Good Remote;   Good  Judgement:  Intact  Insight:  Present  Psychomotor Activity:  NA  Concentration:  Concentration: Good and Attention Span: Good  Recall:  Good  Fund of Knowledge:  Good  Language:  Good  Akathisia:  No  Handed:  Right  AIMS (if indicated):     Assets:  Communication Skills Desire for Improvement Housing Resilience Social Support Talents/Skills Transportation  ADL's:  Intact  Cognition:  WNL  Sleep:   ok      Assessment and Plan: Schizoaffective disorder, bipolar type.  Anxiety.  Patient is stable on Latuda 80 mg and Klonopin with his helping her anxiety.  She is no longer taking Trilafon.  She does not want to change the medication.  Discussed medication side effects and benefits.  Patient has a appointment in [facility] in the month of June but she like to have another appointment with this Clinical research associate as a backup if her appointment with [facility] did not go well.  I will continue current medication.  Follow-up in 3 months.  I recommend to call us back if there is any question or any  concern.  Follow Up Instructions:    I discussed the assessment and treatment plan with the patient. The patient was provided an opportunity to ask questions and all were answered. The patient agreed with the plan and demonstrated an understanding of the instructions.   The patient was advised to call back or seek an in-person evaluation if the symptoms worsen or if the condition fails to improve as anticipated.  I provided 17 minutes of non-face-to-face time during this encounter.   Cleotis Nipper, MD

## 2021-03-22 ENCOUNTER — Telehealth (HOSPITAL_COMMUNITY): Payer: Self-pay | Admitting: *Deleted

## 2021-03-22 DIAGNOSIS — F411 Generalized anxiety disorder: Secondary | ICD-10-CM

## 2021-03-22 MED ORDER — CLONAZEPAM 0.5 MG PO TABS: 0.5000 mg | ORAL_TABLET | Freq: Two times a day (BID) | ORAL | 2 refills | Status: AC | PRN

## 2021-03-22 NOTE — Telephone Encounter (Signed)
Pt called requesting a #60 script of the Klonopin (pt was last prescribed #45). Pt has an appointment on 05/31/21.

## 2021-03-22 NOTE — Telephone Encounter (Signed)
Done

## 2021-03-24 ENCOUNTER — Ambulatory Visit
Admission: RE | Admit: 2021-03-24 | Discharge: 2021-03-24 | Disposition: A | Discharge status: Home / Self Care | Payer: Self-pay | Source: Ambulatory Visit | Attending: Neurology | Admitting: Neurology

## 2021-03-24 ENCOUNTER — Other Ambulatory Visit: Payer: Self-pay

## 2021-03-24 DIAGNOSIS — H5310 Unspecified subjective visual disturbances: Secondary | ICD-10-CM

## 2021-03-24 MED ORDER — IOPAMIDOL (ISOVUE-370) INJECTION 76%
75.0000 mL | Freq: Once | INTRAVENOUS | Status: AC | PRN
  Administered 2021-03-24: 75 mL via INTRAVENOUS

## 2021-03-27 ENCOUNTER — Telehealth: Payer: Self-pay | Admitting: Neurology

## 2021-03-27 NOTE — Telephone Encounter (Signed)
Please call the patient, CTA head was unremarkable, CTA neck could not visualize the right subclavian artery and part of the right vertebral artery, due to contrast bolus, but the carotid arteries are patent without stenosis, as well as the vertebral arteries.  Of note, CT head incidentally mentions partially empty sella turcica, not mentioned on MRI previously, she may benefit from ophthalmology evaluation, this finding is commonly incidental but can be associated with IIH.  IMPRESSION: CT head:  1. No evidence of acute intracranial abnormality. 2. Partially empty sella turcica. This finding is very commonly incidental, but can be associated with idiopathic intracranial hypertension.  CTA neck:  1. Streak artifact from a dense right-sided contrast bolus partially obscures the right subclavian artery, and obscures the origin/V1 segment of the right vertebral artery. 2. Common carotid and internal carotid arteries are patent within the neck without stenosis. 3. Vertebral arteries are patent within the neck without visible stenosis.  CTA head:  Unremarkable exam. No intracranial large vessel occlusion or proximal high-grade arterial stenosis.

## 2021-03-28 NOTE — Telephone Encounter (Signed)
Pt is asking for a call back from Maralyn Sago, NP to discuss results further.

## 2021-03-28 NOTE — Telephone Encounter (Signed)
I called the patient, reviewed findings, see prior telephone note, she will get an eye exam with an ophthalmologist.  Otherwise keep next follow-up appointment.

## 2021-05-31 ENCOUNTER — Telehealth (HOSPITAL_COMMUNITY): Payer: PRIVATE HEALTH INSURANCE | Admitting: Psychiatry

## 2021-09-12 ENCOUNTER — Ambulatory Visit: Payer: No Typology Code available for payment source | Admitting: Family Medicine

## 2021-09-19 ENCOUNTER — Encounter: Payer: Self-pay | Admitting: Family Medicine

## 2021-09-19 ENCOUNTER — Ambulatory Visit: Payer: No Typology Code available for payment source | Admitting: Family Medicine

## 2021-09-19 NOTE — Progress Notes (Deleted)
No chief complaint on file.    HISTORY OF PRESENT ILLNESS:  09/19/21 ALL:  Lisa [name] is a 30 y.o. female here today for follow up for migraines and subjective visual disturbances. She continues propranolol 40mg  BID. She continues close follow up with psychiatry.    03/08/21 ALL: Lisa [name] is a 30 year old female with history of bipolar disorder, panic disorder, initially seen by Dr. 28 October 2020 for problems with light sensitivity.  The visual events may be associated with migraine.  She was placed on propanolol.  Takes Maxalt if needed.  Since propanolol, no longer complains of the migraine headaches.  She does however continue to have the light sensitivity, thinks seen extra bright, highlighted.  Often times, will have the light sensitivity, then followed by panic attack if she does not take clonazepam.  For the last year, also reports light sensitivity with high intensity exercise, the light sensitivity does go away with exercise cessation, does not require clonazepam, claims BP is okay.  Has been having these issues since 2020 when she had a mania, psychosis significant episode. Vision problems happen 3-4 days a week. Here today alone, she is a 2021.    HISTORY 10/24/2020 Dr. 10/26/2020: Lisa [name] is a 30 year old left-handed white female with a history of bipolar disorder and a history of a panic disorder.  The patient comes to the office today with a 1 year history of some problems with light sensitivity.  She has noted that fluorescent lights in particular are associated with a heightened sensitivity to light, she has noted that coffee, alcohol, missing a meal, dehydration, and exercise may also do the same thing.  She has stopped caffeine intake and alcohol intake.  Initially, the episodes were daily in nature may last all day long, usually activated while at work when exposed to fluorescent light.  The patient has had a reduction in frequency, she is  having 2 or 3 episodes a week, and the episodes may last several hours and not all day.  The perphenazine dose was reduced, she believes that this has helped some.  The patient has been seen through her optometrist, no ocular issues were noted.  The patient reports some distortion of movement during the episodes of light sensitivity.  She does not have any scotoma or flashing lights.  The patient also has headaches that are mainly retro-orbital in nature, these may occur once or twice a week, sometimes she has to leave work early.  Episodes will last about 2 hours and may be associated with some queasiness but no actual vomiting.  She reports that during the episodes of light sensitivity, she will also have some phonophobia as well.  She denies any family history of migraine headache.  She reports no focal numbness or weakness of the face, arms, legs.  She does have some decreased ability to concentrate and focus during the headache.  She denies issues with balance or difficulty controlling the bowels or the bladder.  She is sent to this office for an evaluation.      REVIEW OF SYSTEMS: Out of a complete 14 system review of symptoms, the patient complains only of the following symptoms, and all other reviewed systems are negative.   ALLERGIES: Allergies  Allergen Reactions   Artane [Trihexyphenidyl] Anaphylaxis   Ambien [Zolpidem] Other (See Comments)    Sleep walking   Other Other (See Comments)    Nuts - food poisoning effect, makes sick on stomach  HOME MEDICATIONS: Outpatient Medications Prior to Visit  Medication Sig Dispense Refill   clonazePAM (KLONOPIN) 0.5 MG tablet Take 1 tablet (0.5 mg total) by mouth 2 (two) times daily as needed for anxiety. 60 tablet 2   levonorgestrel-ethinyl estradiol (SEASONALE) 0.15-0.03 MG tablet Take 1 tablet by mouth daily. 91 tablet 4   lurasidone (LATUDA) 80 MG TABS tablet Take 1 tablet (80 mg total) by mouth daily with breakfast. 30 tablet 2    propranolol (INDERAL) 40 MG tablet Take 1 tablet (40 mg total) by mouth 2 (two) times daily. 60 tablet 8   No facility-administered medications prior to visit.     PAST MEDICAL HISTORY: Past Medical History:  Diagnosis Date   Anger    Anxiety    Bizarre delusion (HCC)    Common migraine with intractable migraine 10/24/2020   Paranoia (HCC)    Psychosis (HCC)      PAST SURGICAL HISTORY: Past Surgical History:  Procedure Laterality Date   extraction of wisdom teeth       FAMILY HISTORY: Family History  Problem Relation Age of Onset   Schizophrenia Mother    Depression Father    Seizures Sister      SOCIAL HISTORY: Social History   Socioeconomic History   Marital status: Single    Spouse name: Not on file   Number of children: Not on file   Years of education: Not on file   Highest education level: Not on file  Occupational History   Occupation: full time  Tobacco Use   Smoking status: Former    Packs/day: 0.25    Years: 0.50    Pack years: 0.13    Types: Cigarettes    Quit date: 09/03/2015    Years since quitting: 6.0   Smokeless tobacco: Never  Substance and Sexual Activity   Alcohol use: Yes    Comment: casually   Drug use: No    Types: Marijuana    Comment: THC last used 2 yrs ago, unsure of amount   Sexual activity: Not Currently    Comment: Plan B Pill  Other Topics Concern   Not on file  Social History Narrative   Lives with parents   Left Handed   Drinks no caffeine   Social Determinants of Health   Financial Resource Strain: Not on file  Food Insecurity: Not on file  Transportation Needs: Not on file  Physical Activity: Not on file  Stress: Not on file  Social Connections: Not on file  Intimate Partner Violence: Not on file     PHYSICAL EXAM  There were no vitals filed for this visit. There is no height or weight on file to calculate BMI.  Generalized: Well developed, in no acute distress  Cardiology: normal rate and rhythm,  no murmur auscultated  Respiratory: clear to auscultation bilaterally    Neurological examination  Mentation: Alert oriented to time, place, history taking. Follows all commands speech and language fluent Cranial nerve II-XII: Pupils were equal round reactive to light. Extraocular movements were full, visual field were full on confrontational test. Facial sensation and strength were normal. Uvula tongue midline. Head turning and shoulder shrug  were normal and symmetric. Motor: The motor testing reveals 5 over 5 strength of all 4 extremities. Good symmetric motor tone is noted throughout.  Sensory: Sensory testing is intact to soft touch on all 4 extremities. No evidence of extinction is noted.  Coordination: Cerebellar testing reveals good finger-nose-finger and heel-to-shin bilaterally.  Gait and station:  Gait is normal. Tandem gait is normal. Romberg is negative. No drift is seen.  Reflexes: Deep tendon reflexes are symmetric and normal bilaterally.    DIAGNOSTIC DATA (LABS, IMAGING, TESTING) - I reviewed patient records, labs, notes, testing and imaging myself where available.  Lab Results  Component Value Date   WBC 8.7 09/15/2019   HGB 15.1 (H) 09/15/2019   HCT 45.6 09/15/2019   MCV 92.7 09/15/2019   PLT 309 09/15/2019      Component Value Date/Time   NA 139 09/27/2019 1826   NA 139 08/20/2019 0912   K 4.3 09/27/2019 1826   CL 107 09/27/2019 1826   CO2 24 09/27/2019 1826   GLUCOSE 69 (L) 09/27/2019 1826   BUN 10 09/27/2019 1826   BUN 9 08/20/2019 0912   CREATININE 0.86 03/08/2021 1555   CALCIUM 8.8 (L) 09/27/2019 1826   PROT 6.9 09/27/2019 1826   PROT 7.4 08/20/2019 0912   ALBUMIN 3.8 09/27/2019 1826   ALBUMIN 5.1 (H) 08/20/2019 0912   AST 35 09/27/2019 1826   ALT 32 09/27/2019 1826   ALKPHOS 59 09/27/2019 1826   BILITOT 0.6 09/27/2019 1826   BILITOT 0.5 08/20/2019 0912   GFRNONAA >60 09/27/2019 1826   GFRAA >60 09/27/2019 1826   Lab Results  Component Value  Date   CHOL 210 (H) 09/15/2019   HDL 60 09/15/2019   LDLCALC 131 (H) 09/15/2019   TRIG 97 09/15/2019   CHOLHDL 3.5 09/15/2019   Lab Results  Component Value Date   HGBA1C 5.3 09/15/2019   Lab Results  Component Value Date   VITAMINB12 269 08/20/2019   Lab Results  Component Value Date   TSH 1.156 09/15/2019    No flowsheet data found.   No flowsheet data found.   ASSESSMENT AND PLAN  30 y.o. year old female  has a past medical history of Anger, Anxiety, Bizarre delusion (HCC), Common migraine with intractable migraine (10/24/2020), Paranoia (HCC), and Psychosis (HCC). here with    No diagnosis found.   No orders of the defined types were placed in this encounter.    No orders of the defined types were placed in this encounter.     Shawnie Dapper, MSN, FNP-C 09/19/2021, 11:05 AM  [facility] [address] [place], [place] [address] [phone]

## 2023-02-03 IMAGING — CT CT ANGIO NECK
1 of 2 series · 11 of 16 positions shown · IV contrast (iopamidol)
Comparison: MRI of the brain and cervical spine 08/25/2019. CT head
11/18/2013.

CLINICAL DATA: Subjective vision disturbance. Dizziness,
nonspecific; episodic photophobia. Additional history provided by
scanning technologist: Patient reports symptoms for 1 year.

EXAM:
CT ANGIOGRAPHY HEAD AND NECK
TECHNIQUE: Multidetector CT imaging of the head and neck was performed using
the standard protocol during bolus administration of intravenous
contrast. Multiplanar CT image reconstructions and MIPs were
obtained to evaluate the vascular anatomy. Carotid stenosis
measurements (when applicable) are obtained utilizing NASCET
criteria, using the distal internal carotid diameter as the
denominator.
CONTRAST:  75mL [id number] IOPAMIDOL ([id number]) INJECTION 76%

[Series 9: head/neck angio · axial · 0.57mm/px · z∈[-318,-24]mm · 11 of 177 slices shown]
[im 15/177  soft-tissue]
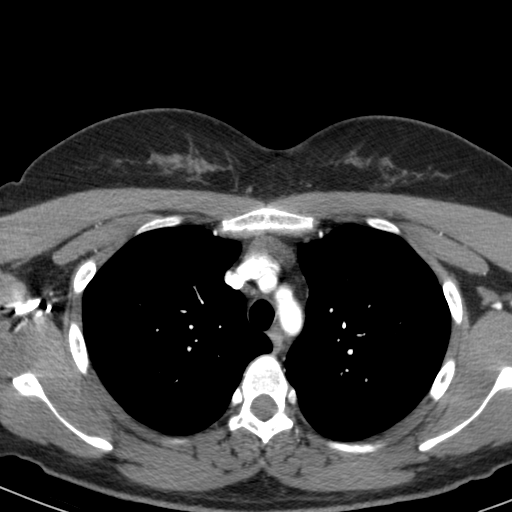
[im 30/177  bone]
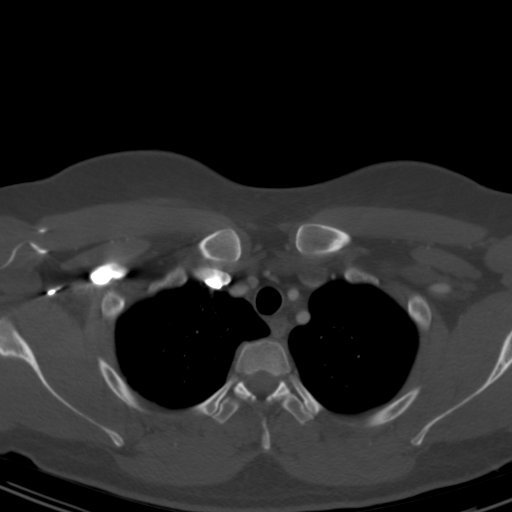
[im 45/177  soft-tissue]
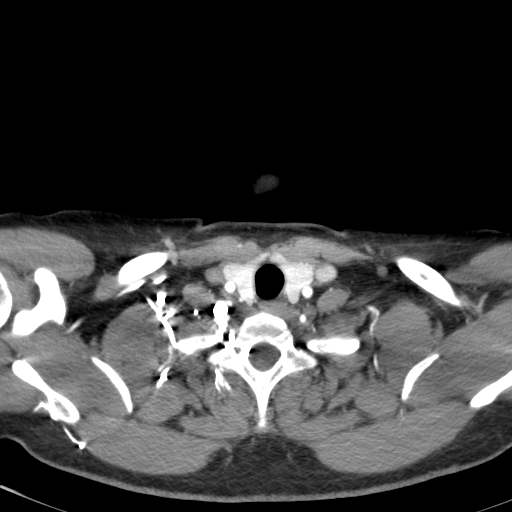
[im 59/177  bone]
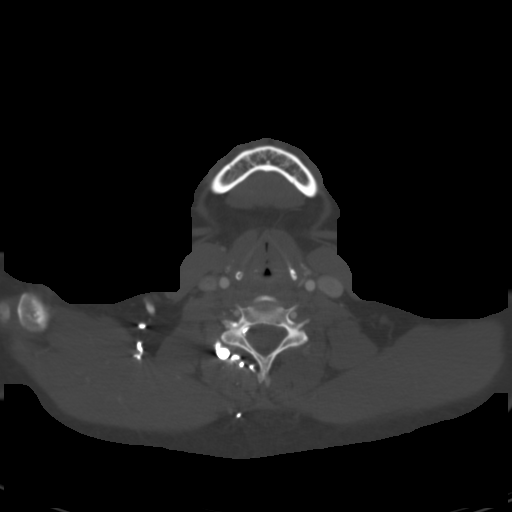
[im 74/177  soft-tissue]
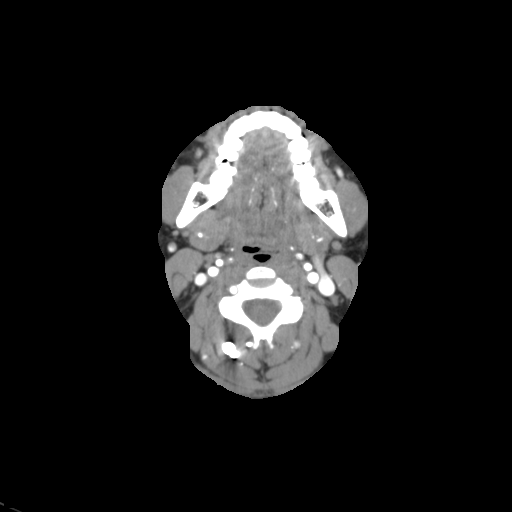
[im 89/177  bone]
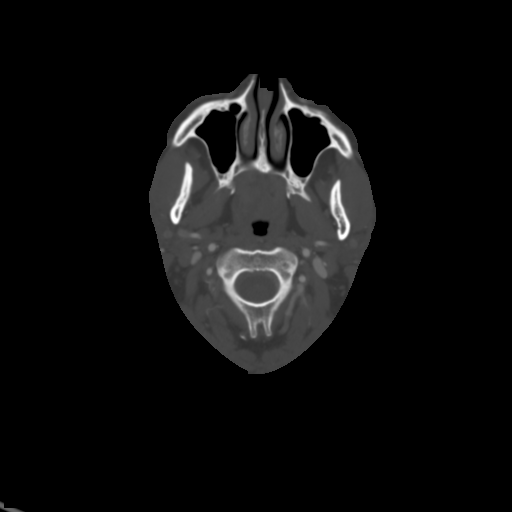
[im 103/177  soft-tissue]
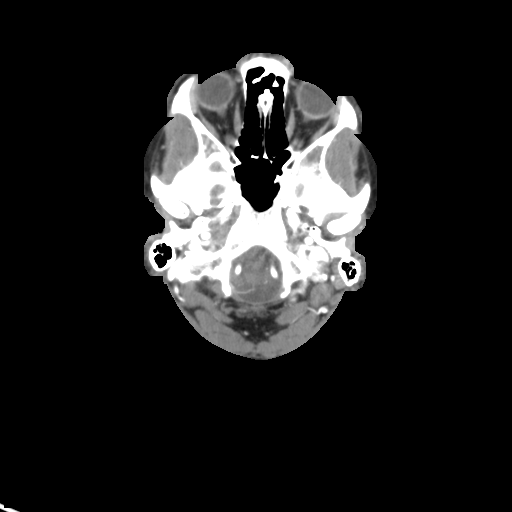
[im 118/177  bone]
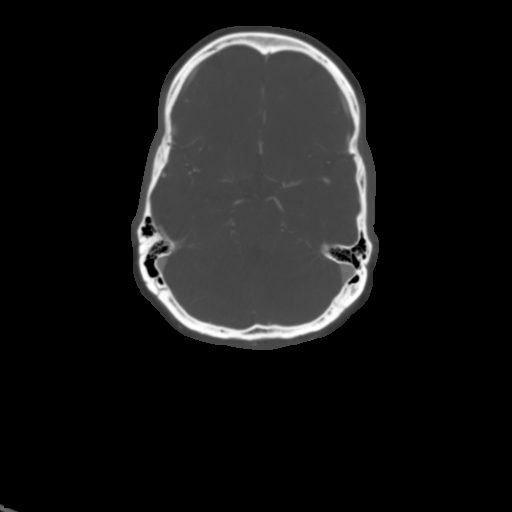
[im 133/177  soft-tissue]
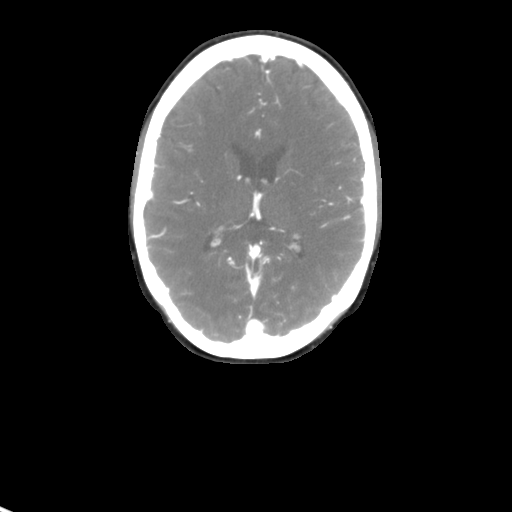
[im 147/177  bone]
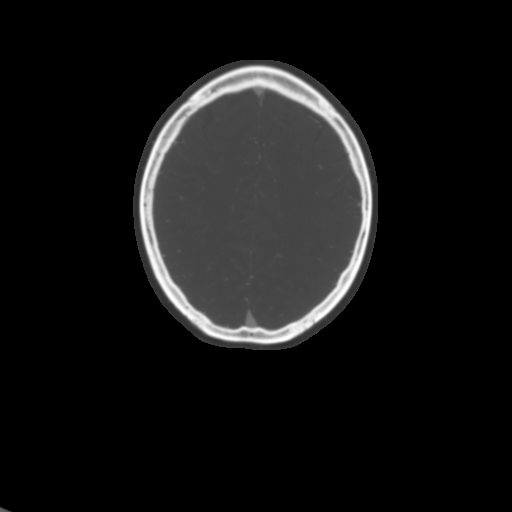
[im 162/177  soft-tissue]
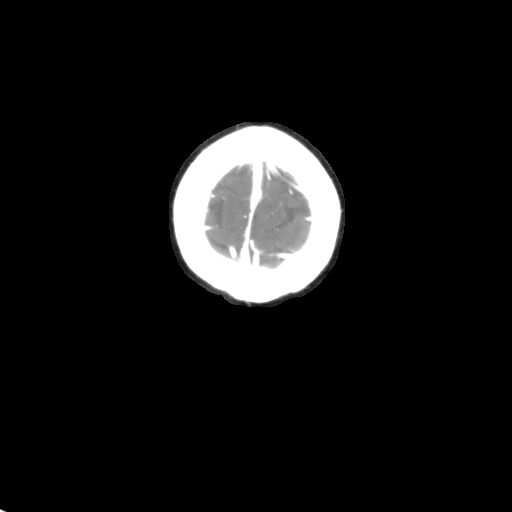

[11 of 16 positions shown; findings below may reference images not displayed]

FINDINGS: CT HEAD FINDINGS

Brain:

Cerebral volume is normal.

There is no acute intracranial hemorrhage.

No demarcated cortical infarct.

No extra-axial fluid collection.

No evidence of intracranial mass.

No midline shift.

Partially empty sella turcica.

Vascular: No hyperdense vessel.

Skull: Normal. Negative for fracture or focal lesion.

Sinuses: No significant paranasal sinus disease.

Orbits: No mass or acute finding.

Review of the MIP images confirms the above findings

CTA NECK FINDINGS

Aortic arch: Standard aortic branching. The visualized aortic arch
is normal in caliber. Streak artifact from a dense right-sided
contrast bolus partially obscures the right subclavian artery.
Within this limitation, there is no significant innominate or
proximal subclavian artery stenosis.

Right carotid system: CCA and ICA patent within the neck without
stenosis. No significant atherosclerotic disease.

Left carotid system: CCA and ICA patent within the neck without
stenosis. No significant atherosclerotic disease.

Vertebral arteries: Vertebral arteries patent within the neck.
Streak artifact from a dense right-sided contrast bolus obscures the
origin and V1 segment of the right vertebral artery. No stenosis
within the visualized vertebral arteries.

Skeleton: No acute bony abnormality or aggressive osseous lesion.
Trace C2-C3, C3-C4 and C4-C5 grade 1 anterolisthesis.

Other neck: No neck mass or cervical lymphadenopathy. Thyroid
unremarkable.

Upper chest: No consolidation within the imaged lung apices.

Review of the MIP images confirms the above findings

CTA HEAD FINDINGS

Anterior circulation:

The intracranial internal carotid arteries are patent. The M1 middle
cerebral arteries are patent. No M2 proximal branch occlusion or
high-grade proximal stenosis is identified. The anterior cerebral
arteries are patent. No intracranial aneurysm is identified.

Posterior circulation:

The intracranial vertebral arteries are patent. The basilar artery
is patent. The posterior cerebral arteries are patent. Posterior
communicating arteries are present bilaterally.

Venous sinuses: Within the limitations of contrast timing, no
convincing thrombus.

Anatomic variants: None significant

Review of the MIP images confirms the above findings
IMPRESSION: CT head:

1. No evidence of acute intracranial abnormality.
2. Partially empty sella turcica. This finding is very commonly
incidental, but can be associated with idiopathic intracranial
hypertension.

CTA neck:

1. Streak artifact from a dense right-sided contrast bolus partially
obscures the right subclavian artery, and obscures the origin/V1
segment of the right vertebral artery.
2. Common carotid and internal carotid arteries are patent within
the neck without stenosis.
3. Vertebral arteries are patent within the neck without visible
stenosis.

CTA head:

Unremarkable exam. No intracranial large vessel occlusion or
proximal high-grade arterial stenosis.
